# Patient Record
Sex: Male | Born: 1965 | Race: White | Hispanic: No | Marital: Single | Smoking: Never smoker
Health system: Southern US, Community
[De-identification: ages and names within clinical notes are randomized; demographics above are authoritative.]

## PROBLEM LIST (undated history)

## (undated) DIAGNOSIS — E785 Hyperlipidemia, unspecified: Secondary | ICD-10-CM

## (undated) DIAGNOSIS — K219 Gastro-esophageal reflux disease without esophagitis: Secondary | ICD-10-CM

## (undated) DIAGNOSIS — I10 Essential (primary) hypertension: Secondary | ICD-10-CM

## (undated) DIAGNOSIS — M722 Plantar fascial fibromatosis: Secondary | ICD-10-CM

## (undated) DIAGNOSIS — N200 Calculus of kidney: Secondary | ICD-10-CM

## (undated) DIAGNOSIS — E119 Type 2 diabetes mellitus without complications: Secondary | ICD-10-CM

## (undated) HISTORY — DX: Hyperlipidemia, unspecified: E78.5

## (undated) HISTORY — PX: KNEE ARTHROSCOPY: SHX127

## (undated) HISTORY — DX: Calculus of kidney: N20.0

## (undated) HISTORY — DX: Plantar fascial fibromatosis: M72.2

## (undated) HISTORY — PX: KIDNEY STONE SURGERY: SHX686

## (undated) HISTORY — PX: GALLBLADDER SURGERY: SHX652

## (undated) HISTORY — DX: Essential (primary) hypertension: I10

---

## 2004-01-19 HISTORY — PX: CHOLECYSTECTOMY: SHX55

## 2008-03-09 ENCOUNTER — Emergency Department: Payer: Self-pay | Admitting: Emergency Medicine

## 2010-06-10 ENCOUNTER — Ambulatory Visit: Payer: Self-pay | Admitting: Internal Medicine

## 2010-06-18 ENCOUNTER — Ambulatory Visit: Payer: Self-pay | Admitting: Urology

## 2012-12-07 ENCOUNTER — Encounter: Payer: Self-pay | Admitting: Podiatry

## 2013-04-09 ENCOUNTER — Encounter: Payer: Self-pay | Admitting: Podiatry

## 2013-04-09 ENCOUNTER — Ambulatory Visit (INDEPENDENT_AMBULATORY_CARE_PROVIDER_SITE_OTHER): Payer: BC Managed Care – PPO | Admitting: Podiatry

## 2013-04-09 VITALS — BP 115/66 | HR 76 | Resp 18 | Ht 71.0 in | Wt 260.0 lb

## 2013-04-09 DIAGNOSIS — M722 Plantar fascial fibromatosis: Secondary | ICD-10-CM

## 2013-04-09 MED ORDER — METHYLPREDNISOLONE (PAK) 4 MG PO TABS: ORAL_TABLET | ORAL | Status: DC

## 2013-04-09 NOTE — Progress Notes (Signed)
Need an injection in both feet. Both heels it started hurting.  Objective: Vital signs are stable he is alert and oriented x3. He has pain on palpation medial continued tubercles bilateral. Pulses are palpable bilateral.  Assessment: Plantar fasciitis bilateral.  Plan: Injected the bilateral heels today with Kenalog and local anesthetic. I discussed the use of his new orthotics that he has at home that he has not used. Also discussed the use of purchasing new boots for his work. I also wrote a prescription for Medrol Dosepak. He will discontinue his naproxen while taking this. I will followup with him in one month.

## 2014-02-25 ENCOUNTER — Ambulatory Visit (INDEPENDENT_AMBULATORY_CARE_PROVIDER_SITE_OTHER): Payer: BLUE CROSS/BLUE SHIELD | Admitting: Podiatry

## 2014-02-25 ENCOUNTER — Encounter: Payer: Self-pay | Admitting: Podiatry

## 2014-02-25 VITALS — BP 150/96 | HR 68 | Resp 12

## 2014-02-25 DIAGNOSIS — M722 Plantar fascial fibromatosis: Secondary | ICD-10-CM

## 2014-02-25 MED ORDER — METHYLPREDNISOLONE (PAK) 4 MG PO TABS: ORAL_TABLET | ORAL | Status: DC

## 2014-02-25 NOTE — Progress Notes (Signed)
He presents today with a chief complaint of painful heels bilateral. I have reviewed his past medical history medications and allergies he does have a history of plantar fasciitis bilateral. He is requesting pain medication today.  Objective: Vital signs are stable he is alert and oriented 3. He denies any changes in his past medical history medications or allergies. He denies any trauma to the bilateral foot. Pulses are strongly palpable bilateral. He has pain on palpation medial calcaneal tubercles bilateral. He walks with the right foot abducted out.  Assessment: Chronic intractable plantar fasciitis bilateral.  Plan: Medrol Dosepak to be followed by meloxicam. Injected the bilateral heels today. He was also dispensed plantar fascial braces bilateral. We discussed appropriate shoe gear stress changes sizes ice therapy shoe modification and the use of his orthotics which he already has. We also discussed the possible need for surgical intervention he understands this and is amenable to it and we'll look into it in the future he says.

## 2014-05-09 DIAGNOSIS — E669 Obesity, unspecified: Secondary | ICD-10-CM | POA: Insufficient documentation

## 2014-05-09 DIAGNOSIS — T884XXA Failed or difficult intubation, initial encounter: Secondary | ICD-10-CM | POA: Insufficient documentation

## 2014-06-03 ENCOUNTER — Encounter: Payer: BLUE CROSS/BLUE SHIELD | Attending: Family Medicine | Admitting: *Deleted

## 2014-06-03 ENCOUNTER — Encounter: Payer: Self-pay | Admitting: *Deleted

## 2014-06-03 VITALS — BP 112/72 | Ht 69.0 in | Wt 243.3 lb

## 2014-06-03 DIAGNOSIS — E119 Type 2 diabetes mellitus without complications: Secondary | ICD-10-CM

## 2014-06-03 NOTE — Patient Instructions (Signed)
Check blood sugars 1 x day before breakfast or 2 hrs after supper every day  Exercise:  Walk as tolerated  Eat 3 meals day,   2  snacks a day Space meals 4-6 hours apart  Avoid sugar sweetened drinks (soda, tea, juices)  Bring blood sugar records to the next appointment with your doctor

## 2014-06-03 NOTE — Progress Notes (Signed)
Diabetes Self-Management Education  Visit Type:    Appt. Start Time: 10:15 Appt. End Time: 11:45  06/03/2014  [name], identified by name and date of birth, is a 49 y.o. male with a diagnosis of Diabetes: Type 2.  Other people present during visit:  Parent (Mother)   ASSESSMENT  Blood pressure 112/72, height 5\' 9"  (1.753 m), weight 243 lb 4.8 oz (110.36 kg). Body mass index is 35.91 kg/(m^2).  Initial Visit Information:  Are you currently following a meal plan?: No   Are you taking your medications as prescribed?: Yes Are you checking your feet?: No   How often do you need to have someone help you when you read instructions, pamphlets, or other written materials from your doctor or pharmacy?: 1 - Never What is the last grade level you completed in school?: 12 + 1 year collge  Psychosocial:   Patient Belief/Attitude about Diabetes: Denial Self-care barriers: Unsteady gait/risk for falls Self-management support: Family, Doctor's office Other persons present: Parent (Mother) Patient Concerns: Nutrition/Meal planning, Monitoring, Medication, Healthy Lifestyle, Weight Control Special Needs: None Preferred Learning Style: Hands on Learning Readiness: Not Ready  Complications:   Last HgB A1C per patient/outside source: 8.6 mg/dL (7/82/954/28/16) How often do you check your blood sugar?: 1-2 times/day Fasting Blood glucose range (mg/dL): [id number], 657-846180-200, >962>200 Have you had a dilated eye exam in the past 12 months?: Yes Have you had a dental exam in the past 12 months?: Yes  Diet Intake:  Beverage(s): drinks fruit juices and sugar sweetened beverages  He doesn't cook and his mother plans, cooks and shops for his meals.   Exercise:  Exercise: ADL's  Individualized Plan for Diabetes Self-Management Training:   Learning Objective:  Patient will have a greater understanding of diabetes self-management.   Education Topics Reviewed with Patient Today:  Factors that  contribute to the development of diabetes Role of diet in the treatment of diabetes and the relationship between the three main macronutrients and blood glucose level, Reviewed blood glucose goals for pre and post meals and how to evaluate the patients' food intake on their blood glucose level. Role of exercise on diabetes management, blood pressure control and cardiac health.   Purpose and frequency of SMBG., Taught/discussed recording of test results and interpretation of SMBG.   Relationship between chronic complications and blood glucose control    Lifestyle issues that need to be addressed for better diabetes care  PATIENTS GOALS/Plan (Developed by the patient): Improve blood sugars Decrease medications Prevent diabetes complications Lose weight Lead a healthier lifestyle Become more fit   Patient Instructions  Check blood sugars 1 x day before breakfast or 2 hrs after supper every day  Exercise:  Walk as tolerated  Eat 3 meals day,   2  snacks a day Space meals 4-6 hours apart  Avoid sugar sweetened drinks (soda, tea, juices)  Bring blood sugar records to the next appointment with your doctor   Expected Outcomes:  Demonstrated limited interest in learning.  Expect minimal changes.   Education material provided: General Meal Planning Guidelines  If problems or questions, patient to contact team via:  Sharion SettlerSheila Lauralyn Shadowens, RN [phone]  Future DSME appointment:   None During this appointment the patient interrupted the nurse multiple times to ask questions about food. Many questions were often repeated. He told me that I was giving him contradictory information.  Explained that I was instructing per ADA guidelines. The patient pushed back from the table at one point and  reported that he wouldn't be coming back. His mother asked that I continue the instruction on meal planning. They were also argumentative towards each other. He will follow up with his PCP.

## 2014-07-02 ENCOUNTER — Ambulatory Visit (INDEPENDENT_AMBULATORY_CARE_PROVIDER_SITE_OTHER): Payer: BLUE CROSS/BLUE SHIELD | Admitting: Family Medicine

## 2014-07-02 ENCOUNTER — Encounter: Payer: Self-pay | Admitting: Family Medicine

## 2014-07-02 VITALS — BP 127/75 | HR 77 | Temp 97.5°F | Resp 16 | Ht 66.0 in | Wt 237.0 lb

## 2014-07-02 DIAGNOSIS — I10 Essential (primary) hypertension: Secondary | ICD-10-CM | POA: Diagnosis not present

## 2014-07-02 DIAGNOSIS — E1159 Type 2 diabetes mellitus with other circulatory complications: Secondary | ICD-10-CM | POA: Insufficient documentation

## 2014-07-02 DIAGNOSIS — B356 Tinea cruris: Secondary | ICD-10-CM

## 2014-07-02 DIAGNOSIS — E669 Obesity, unspecified: Secondary | ICD-10-CM | POA: Insufficient documentation

## 2014-07-02 DIAGNOSIS — E785 Hyperlipidemia, unspecified: Secondary | ICD-10-CM | POA: Diagnosis not present

## 2014-07-02 DIAGNOSIS — E119 Type 2 diabetes mellitus without complications: Secondary | ICD-10-CM | POA: Diagnosis not present

## 2014-07-02 DIAGNOSIS — E1169 Type 2 diabetes mellitus with other specified complication: Secondary | ICD-10-CM

## 2014-07-02 MED ORDER — TRAMADOL HCL 50 MG PO TABS: 50.0000 mg | ORAL_TABLET | Freq: Three times a day (TID) | ORAL | Status: DC | PRN

## 2014-07-02 NOTE — Assessment & Plan Note (Signed)
The current medical regimen is effective;  continue present plan and medications.  

## 2014-07-02 NOTE — Assessment & Plan Note (Signed)
The current medical regimen is effective;  continue present plan and medications. Cont diet exercise and wt loss

## 2014-07-02 NOTE — Progress Notes (Signed)
   BP 127/75 mmHg  Pulse 77  Temp(Src) 97.5 F (36.4 C) (Oral)  Resp 16  Ht 5\' 6"  (1.676 m)  Wt 237 lb (107.502 kg)  BMI 38.27 kg/m2  SpO2 99%   Subjective:    Patient ID: Joshua Lee, male    DOB: 1965/08/01, 49 y.o.   MRN: [id number]  HPI: Joshua Lee is a 49 y.o. male  Chief Complaint  Patient presents with  . Follow-up    Glucose  . Medication Refill    Tramadol  . Hip Pain    Right onset 1-2 years, gotten worse  pt doing well with DM and new meds  Glu monitoring review is showing glu down to 130s Some jock itch problems with heat Has lost 10lbs Hip pain takes tramadol for and feet stable  Relevant past medical, surgical, family and social history reviewed and updated as indicated. Interim medical history since our last visit reviewed. Allergies and medications reviewed and updated.  Review of Systems  Constitutional: Negative.   Respiratory: Negative.   Cardiovascular: Negative.     Per HPI unless specifically indicated above     Objective:    BP 127/75 mmHg  Pulse 77  Temp(Src) 97.5 F (36.4 C) (Oral)  Resp 16  Ht 5\' 6"  (1.676 m)  Wt 237 lb (107.502 kg)  BMI 38.27 kg/m2  SpO2 99%  Wt Readings from Last 3 Encounters:  07/02/14 237 lb (107.502 kg)  05/21/14 247 lb (112.038 kg)  06/03/14 243 lb 4.8 oz (110.36 kg)    Physical Exam  Constitutional: He is oriented to person, place, and time. He appears well-developed and well-nourished. No distress.  HENT:  Head: Normocephalic and atraumatic.  Right Ear: Hearing normal.  Left Ear: Hearing normal.  Nose: Nose normal.  Eyes: Conjunctivae and lids are normal. Right eye exhibits no discharge. Left eye exhibits no discharge. No scleral icterus.  Cardiovascular: Normal rate, regular rhythm and normal heart sounds.   Pulmonary/Chest: Effort normal and breath sounds normal. No respiratory distress.  Musculoskeletal: Normal range of motion.  Neurological: He is alert and oriented to person, place,  and time.  Skin: Skin is intact. No rash noted.  Psychiatric: He has a normal mood and affect. His speech is normal and behavior is normal. Judgment and thought content normal. Cognition and memory are normal.    No results found for this or any previous visit.    Assessment & Plan:   Problem List Items Addressed This Visit      Cardiovascular and Mediastinum   Hypertension - Primary    The current medical regimen is effective;  continue present plan and medications.        Endocrine   Diabetes mellitus without complication    The current medical regimen is effective;  continue present plan and medications. Cont diet exercise and wt loss      Relevant Orders   Hemoglobin A1c     Other   Hyperlipidemia    The current medical regimen is effective;  continue present plan and medications.        Other Visit Diagnoses    Jock itch        discuss care and use of OTC meds        Follow up plan: Return in about 2 months (around 09/01/2014) for DM check.

## 2014-07-14 ENCOUNTER — Other Ambulatory Visit: Payer: Self-pay | Admitting: Family Medicine

## 2014-08-09 ENCOUNTER — Other Ambulatory Visit: Payer: Self-pay | Admitting: Family Medicine

## 2014-08-16 ENCOUNTER — Other Ambulatory Visit: Payer: Self-pay | Admitting: Family Medicine

## 2014-09-02 ENCOUNTER — Ambulatory Visit (INDEPENDENT_AMBULATORY_CARE_PROVIDER_SITE_OTHER): Payer: BLUE CROSS/BLUE SHIELD | Admitting: Family Medicine

## 2014-09-02 ENCOUNTER — Encounter: Payer: Self-pay | Admitting: Family Medicine

## 2014-09-02 VITALS — BP 110/64 | HR 86 | Temp 98.1°F | Ht 68.5 in | Wt 241.0 lb

## 2014-09-02 DIAGNOSIS — I10 Essential (primary) hypertension: Secondary | ICD-10-CM | POA: Diagnosis not present

## 2014-09-02 DIAGNOSIS — E785 Hyperlipidemia, unspecified: Secondary | ICD-10-CM

## 2014-09-02 DIAGNOSIS — E119 Type 2 diabetes mellitus without complications: Secondary | ICD-10-CM

## 2014-09-02 MED ORDER — TRAMADOL HCL 50 MG PO TABS: 50.0000 mg | ORAL_TABLET | Freq: Every day | ORAL | Status: DC | PRN

## 2014-09-02 MED ORDER — METFORMIN HCL 500 MG PO TABS: 500.0000 mg | ORAL_TABLET | Freq: Two times a day (BID) | ORAL | Status: DC

## 2014-09-02 NOTE — Progress Notes (Signed)
   BP 110/64 mmHg  Pulse 86  Temp(Src) 98.1 F (36.7 C)  Ht 5' 8.5" (1.74 m)  Wt 241 lb (109.317 kg)  BMI 36.11 kg/m2  SpO2 96%   Subjective:    Patient ID: Joshua Lee, male    DOB: 08-24-1965, 49 y.o.   MRN: [id number]  HPI: Joshua Lee is a 49 y.o. male  Chief Complaint  Patient presents with  . Diabetes  . Ear Pain    left   Patient follow-up new diagnosis diabetes doing well with metformin no low blood sugar spells home glucose monitoring showing good blood sugars in the low 100s. No problems with medication Blood pressure doing well on medications Cholesterol doing well on medications takes faithfully with no side effects Relevant past medical, surgical, family and social history reviewed and updated as indicated. Interim medical history since our last visit reviewed. Allergies and medications reviewed and updated. Patient takes tramadol one a day during work days which is about 6 days a week for foot pain and has to stand all day long on 10 hour days and foot and knees hurt. Tramadol allows him to continue to work. Review of Systems  Constitutional: Negative.   HENT: Positive for ear pain.   Respiratory: Negative.   Cardiovascular: Negative.     Per HPI unless specifically indicated above     Objective:    BP 110/64 mmHg  Pulse 86  Temp(Src) 98.1 F (36.7 C)  Ht 5' 8.5" (1.74 m)  Wt 241 lb (109.317 kg)  BMI 36.11 kg/m2  SpO2 96%  Wt Readings from Last 3 Encounters:  09/02/14 241 lb (109.317 kg)  07/02/14 237 lb (107.502 kg)  05/21/14 247 lb (112.038 kg)    Physical Exam  Constitutional: He is oriented to person, place, and time. He appears well-developed and well-nourished. No distress.  HENT:  Head: Normocephalic and atraumatic.  Right Ear: Hearing normal.  Left Ear: Hearing normal.  Nose: Nose normal.  Eyes: Conjunctivae and lids are normal. Right eye exhibits no discharge. Left eye exhibits no discharge. No scleral icterus.   Pulmonary/Chest: Effort normal. No respiratory distress.  Musculoskeletal: Normal range of motion.  Neurological: He is alert and oriented to person, place, and time.  Skin: Skin is intact. No rash noted.  Psychiatric: He has a normal mood and affect. His speech is normal and behavior is normal. Judgment and thought content normal. Cognition and memory are normal.    No results found for this or any previous visit.    Assessment & Plan:   Problem List Items Addressed This Visit      Cardiovascular and Mediastinum   Hypertension - Primary    The current medical regimen is effective;  continue present plan and medications.         Endocrine   Diabetes mellitus without complication    Discuss doing very well with diabetes A1c down to 5.8 decrease metformin to 500 twice a day      Relevant Medications   metFORMIN (GLUCOPHAGE) 500 MG tablet     Other   Hyperlipidemia    The current medical regimen is effective;  continue present plan and medications.           Follow up plan: Return in about 3 months (around 12/03/2014) for Physical Exam.

## 2014-09-02 NOTE — Assessment & Plan Note (Signed)
Discuss doing very well with diabetes A1c down to 5.8 decrease metformin to 500 twice a day

## 2014-09-02 NOTE — Assessment & Plan Note (Signed)
The current medical regimen is effective;  continue present plan and medications.  

## 2014-09-03 ENCOUNTER — Other Ambulatory Visit: Payer: Self-pay | Admitting: Family Medicine

## 2014-09-03 LAB — BAYER DCA HB A1C WAIVED: HB A1C: 5.8 % (ref <7.0)

## 2014-10-31 ENCOUNTER — Other Ambulatory Visit: Payer: Self-pay | Admitting: Family Medicine

## 2014-11-04 ENCOUNTER — Other Ambulatory Visit: Payer: Self-pay

## 2014-11-04 MED ORDER — TRAMADOL HCL 50 MG PO TABS: 50.0000 mg | ORAL_TABLET | Freq: Every day | ORAL | Status: DC | PRN

## 2014-11-04 NOTE — Telephone Encounter (Signed)
Call pharm rx is for 1 a day PRN not TID

## 2014-11-04 NOTE — Telephone Encounter (Signed)
PATIENT: Joshua Lee DOB: August 19, 1965 PHARMACY: [facility]  LAST VISIT: 09/02/2014  Patient requests tramadol 50 mg tab once a day.   I contacted pharmacy because the fax that was received was barely visible. Pharmacist said that he had a prescription for tramadol 50 mg for 3 times a day. The prescription sent is for once a day. I do not see in his chart where he has another prescription for 3 times daily.

## 2014-12-02 ENCOUNTER — Ambulatory Visit (INDEPENDENT_AMBULATORY_CARE_PROVIDER_SITE_OTHER): Payer: BLUE CROSS/BLUE SHIELD | Admitting: Family Medicine

## 2014-12-02 ENCOUNTER — Encounter: Payer: Self-pay | Admitting: Family Medicine

## 2014-12-02 VITALS — BP 120/73 | HR 73 | Temp 98.5°F | Ht 67.2 in | Wt 254.0 lb

## 2014-12-02 DIAGNOSIS — E119 Type 2 diabetes mellitus without complications: Secondary | ICD-10-CM | POA: Diagnosis not present

## 2014-12-02 DIAGNOSIS — Z0000 Encounter for general adult medical examination without abnormal findings: Secondary | ICD-10-CM

## 2014-12-02 DIAGNOSIS — E785 Hyperlipidemia, unspecified: Secondary | ICD-10-CM

## 2014-12-02 DIAGNOSIS — I10 Essential (primary) hypertension: Secondary | ICD-10-CM

## 2014-12-02 DIAGNOSIS — N200 Calculus of kidney: Secondary | ICD-10-CM | POA: Diagnosis not present

## 2014-12-02 LAB — URINALYSIS, ROUTINE W REFLEX MICROSCOPIC
BILIRUBIN UA: NEGATIVE
GLUCOSE, UA: NEGATIVE
Leukocytes, UA: NEGATIVE
NITRITE UA: NEGATIVE
PH UA: 6 (ref 5.0–7.5)
Protein, UA: NEGATIVE
Specific Gravity, UA: 1.02 (ref 1.005–1.030)
UUROB: 0.2 mg/dL (ref 0.2–1.0)

## 2014-12-02 LAB — MICROALBUMIN, URINE WAIVED
CREATININE, URINE WAIVED: 100 mg/dL (ref 10–300)
MICROALB, UR WAIVED: 30 mg/L — AB (ref 0–19)
Microalb/Creat Ratio: <30 mg/g (ref <30)

## 2014-12-02 LAB — MICROSCOPIC EXAMINATION: WBC, UA: NONE SEEN /hpf (ref 0–?)

## 2014-12-02 LAB — BAYER DCA HB A1C WAIVED: HB A1C: 6.4 % (ref <7.0)

## 2014-12-02 MED ORDER — TAMSULOSIN HCL 0.4 MG PO CAPS: 0.4000 mg | ORAL_CAPSULE | Freq: Every day | ORAL | Status: DC

## 2014-12-02 MED ORDER — BENAZEPRIL HCL 40 MG PO TABS: 40.0000 mg | ORAL_TABLET | Freq: Every day | ORAL | Status: DC

## 2014-12-02 MED ORDER — METFORMIN HCL 500 MG PO TABS: 500.0000 mg | ORAL_TABLET | Freq: Two times a day (BID) | ORAL | Status: DC

## 2014-12-02 MED ORDER — METOPROLOL TARTRATE 100 MG PO TABS: 100.0000 mg | ORAL_TABLET | Freq: Two times a day (BID) | ORAL | Status: DC

## 2014-12-02 MED ORDER — HYDROCHLOROTHIAZIDE 12.5 MG PO CAPS: 12.5000 mg | ORAL_CAPSULE | Freq: Every day | ORAL | Status: DC

## 2014-12-02 MED ORDER — LOVASTATIN 20 MG PO TABS: 40.0000 mg | ORAL_TABLET | Freq: Every day | ORAL | Status: DC

## 2014-12-02 MED ORDER — NAPROXEN 500 MG PO TABS: 500.0000 mg | ORAL_TABLET | Freq: Two times a day (BID) | ORAL | Status: DC

## 2014-12-02 NOTE — Assessment & Plan Note (Signed)
The current medical regimen is effective;  continue present plan and medications.  

## 2014-12-02 NOTE — Progress Notes (Signed)
BP 120/73 mmHg  Pulse 73  Temp(Src) 98.5 F (36.9 C)  Ht 5' 7.2" (1.707 m)  Wt 254 lb (115.214 kg)  BMI 39.54 kg/m2  SpO2 97%   Subjective:    Patient ID: Joshua Lee, male    DOB: 02/20/1965, 49 y.o.   MRN: [id number]  HPI: Joshua Lee is a 49 y.o. male  Chief Complaint  Patient presents with  . Annual Exam  . URI    X 2 days   sore throat and ears stopped up some congestion and drainage for just 2 days but getting worse just feels stuffy  Patient doing well with diabetes noted low blood sugar spells No problems with medications for blood pressure cholesterol taking medications faithfully  Relevant past medical, surgical, family and social history reviewed and updated as indicated. Interim medical history since our last visit reviewed. Allergies and medications reviewed and updated.  Review of Systems  Constitutional: Negative.   HENT: Negative.   Eyes: Negative.   Respiratory: Negative.   Cardiovascular: Negative.   Gastrointestinal: Negative.   Endocrine: Negative.   Genitourinary: Negative.   Musculoskeletal: Negative.   Skin: Negative.   Allergic/Immunologic: Negative.   Neurological: Negative.   Hematological: Negative.   Psychiatric/Behavioral: Negative.     Per HPI unless specifically indicated above     Objective:    BP 120/73 mmHg  Pulse 73  Temp(Src) 98.5 F (36.9 C)  Ht 5' 7.2" (1.707 m)  Wt 254 lb (115.214 kg)  BMI 39.54 kg/m2  SpO2 97%  Wt Readings from Last 3 Encounters:  12/02/14 254 lb (115.214 kg)  09/02/14 241 lb (109.317 kg)  07/02/14 237 lb (107.502 kg)    Physical Exam  Constitutional: He is oriented to person, place, and time. He appears well-developed and well-nourished. No distress.  HENT:  Head: Normocephalic and atraumatic.  Right Ear: Hearing and external ear normal.  Left Ear: Hearing and external ear normal.  Nose: Nose normal.  Eyes: Conjunctivae, EOM and lids are normal. Pupils are equal, round, and  reactive to light. Right eye exhibits no discharge. Left eye exhibits no discharge. No scleral icterus.  Neck: Normal range of motion. Neck supple.  Cardiovascular: Normal rate, regular rhythm, normal heart sounds and intact distal pulses.   Pulmonary/Chest: Effort normal and breath sounds normal. No respiratory distress.  Abdominal: Soft. Bowel sounds are normal. There is no splenomegaly or hepatomegaly.  Genitourinary: Rectum normal, prostate normal and penis normal.  Musculoskeletal: Normal range of motion.  Neurological: He is alert and oriented to person, place, and time. He has normal reflexes.  Skin: Skin is intact. No rash noted. No erythema.  Psychiatric: He has a normal mood and affect. His speech is normal and behavior is normal. Judgment and thought content normal. Cognition and memory are normal.    Results for orders placed or performed in visit on 09/02/14  Bayer DCA Hb A1c Waived  Result Value Ref Range   Bayer DCA Hb A1c Waived 5.8 <7.0 %      Assessment & Plan:   Problem List Items Addressed This Visit      Cardiovascular and Mediastinum   Hypertension    The current medical regimen is effective;  continue present plan and medications.       Relevant Medications   benazepril (LOTENSIN) 40 MG tablet   lovastatin (MEVACOR) 20 MG tablet   metoprolol (LOPRESSOR) 100 MG tablet   hydrochlorothiazide (MICROZIDE) 12.5 MG capsule     Endocrine  Diabetes mellitus without complication (HCC)    The current medical regimen is effective;  continue present plan and medications.       Relevant Medications   benazepril (LOTENSIN) 40 MG tablet   lovastatin (MEVACOR) 20 MG tablet   metFORMIN (GLUCOPHAGE) 500 MG tablet   Other Relevant Orders   Bayer DCA Hb A1c Waived     Genitourinary   Kidney stones    [facility] and [facility] stable      Relevant Medications   hydrochlorothiazide (MICROZIDE) 12.5 MG capsule     Other   Hyperlipidemia    The  current medical regimen is effective;  continue present plan and medications.       Relevant Medications   benazepril (LOTENSIN) 40 MG tablet   lovastatin (MEVACOR) 20 MG tablet   metoprolol (LOPRESSOR) 100 MG tablet   hydrochlorothiazide (MICROZIDE) 12.5 MG capsule    Other Visit Diagnoses    Annual physical exam    -  Primary    Relevant Orders    CBC with Differential/Platelet    TSH    Urinalysis, Routine w reflex microscopic (not at [facility])    Microalbumin, Urine Waived    PSA    Comprehensive metabolic panel    Lipid Panel w/o Chol/HDL Ratio        Follow up plan: Return in about 3 months (around 03/04/2015) for a1c.

## 2014-12-02 NOTE — Assessment & Plan Note (Signed)
[facility] and [facility]uria stable

## 2014-12-03 ENCOUNTER — Telehealth: Payer: Self-pay | Admitting: Family Medicine

## 2014-12-03 LAB — COMPREHENSIVE METABOLIC PANEL
A/G RATIO: 1.7 (ref 1.1–2.5)
ALBUMIN: 4.3 g/dL (ref 3.5–5.5)
ALT: 42 IU/L (ref 0–44)
AST: 25 IU/L (ref 0–40)
Alkaline Phosphatase: 53 IU/L (ref 39–117)
BILIRUBIN TOTAL: 0.3 mg/dL (ref 0.0–1.2)
BUN / CREAT RATIO: 22 — AB (ref 9–20)
BUN: 22 mg/dL (ref 6–24)
CALCIUM: 9.1 mg/dL (ref 8.7–10.2)
CO2: 25 mmol/L (ref 18–29)
Chloride: 100 mmol/L (ref 97–106)
Creatinine, Ser: 1 mg/dL (ref 0.76–1.27)
GFR, EST AFRICAN AMERICAN: 102 mL/min/{1.73_m2} (ref >59)
GFR, EST NON AFRICAN AMERICAN: 88 mL/min/{1.73_m2} (ref >59)
GLOBULIN, TOTAL: 2.6 g/dL (ref 1.5–4.5)
Glucose: 85 mg/dL (ref 65–99)
POTASSIUM: 4.2 mmol/L (ref 3.5–5.2)
SODIUM: 142 mmol/L (ref 136–144)
TOTAL PROTEIN: 6.9 g/dL (ref 6.0–8.5)

## 2014-12-03 LAB — TSH: TSH: 3.21 u[IU]/mL (ref 0.450–4.500)

## 2014-12-03 LAB — LIPID PANEL W/O CHOL/HDL RATIO
Cholesterol, Total: 257 mg/dL — ABNORMAL HIGH (ref 100–199)
HDL: 30 mg/dL — AB (ref >39)
Triglycerides: 693 mg/dL (ref 0–149)

## 2014-12-03 LAB — CBC WITH DIFFERENTIAL/PLATELET

## 2014-12-03 LAB — PSA: Prostate Specific Ag, Serum: 0.7 ng/mL (ref 0.0–4.0)

## 2014-12-03 NOTE — Telephone Encounter (Signed)
Phone call Discussed with patient elevated triglycerides as expected patient stopped his medicine will be getting back on medicine next week and will do better with diet exercise weight loss recheck lipid panel next office visit

## 2014-12-03 NOTE — Telephone Encounter (Signed)
-----   Message from Lurlean HornsNancy H Wilson, CMA sent at 12/03/2014  4:55 PM EST ----- labs

## 2015-01-03 ENCOUNTER — Other Ambulatory Visit: Payer: Self-pay | Admitting: Family Medicine

## 2015-01-06 ENCOUNTER — Other Ambulatory Visit: Payer: Self-pay | Admitting: Family Medicine

## 2015-01-08 ENCOUNTER — Encounter: Payer: Self-pay | Admitting: Podiatry

## 2015-01-08 ENCOUNTER — Ambulatory Visit (INDEPENDENT_AMBULATORY_CARE_PROVIDER_SITE_OTHER): Payer: BLUE CROSS/BLUE SHIELD | Admitting: Podiatry

## 2015-01-08 DIAGNOSIS — M79674 Pain in right toe(s): Secondary | ICD-10-CM

## 2015-01-08 DIAGNOSIS — M722 Plantar fascial fibromatosis: Secondary | ICD-10-CM | POA: Diagnosis not present

## 2015-01-08 DIAGNOSIS — M79675 Pain in left toe(s): Secondary | ICD-10-CM | POA: Diagnosis not present

## 2015-01-08 DIAGNOSIS — B351 Tinea unguium: Secondary | ICD-10-CM

## 2015-01-08 NOTE — Progress Notes (Signed)
He presents today stating that he is now a diabetic and like to consider injections or his bilateral heel pain. He states that there was a long time that he felt much better but as of late he started to redevelop his heel pain. He requests a Medrol Dosepak as I explained to him this would increase his blood sugar concentration. He is also requesting narcotics which will be denied.  Objective: Pulses are strongly palpable bilateral neurologic sensorium is intact. He has severe pain on palpation medial calcaneal tubercles. No open lesions bilateral. Skin is soft and supple. His toenails are thick yellow dystrophic mycotic and painful palpation as well as debridement.  Assessment: Pain in limb secondary to onychomycosis 1 through 5 bilateral. Plantar fasciitis bilateral.  Plan: Injected the bilateral heels today with Kenalog and local anesthetic. Debrided nails 1 through 5 bilateral. Follow up with us as needed.

## 2015-02-27 ENCOUNTER — Other Ambulatory Visit: Payer: Self-pay | Admitting: Family Medicine

## 2015-03-05 ENCOUNTER — Ambulatory Visit (INDEPENDENT_AMBULATORY_CARE_PROVIDER_SITE_OTHER): Payer: BLUE CROSS/BLUE SHIELD | Admitting: Family Medicine

## 2015-03-05 ENCOUNTER — Encounter: Payer: Self-pay | Admitting: Family Medicine

## 2015-03-05 VITALS — BP 131/72 | HR 83 | Temp 97.3°F | Ht 67.5 in | Wt 248.0 lb

## 2015-03-05 DIAGNOSIS — E119 Type 2 diabetes mellitus without complications: Secondary | ICD-10-CM

## 2015-03-05 DIAGNOSIS — M19071 Primary osteoarthritis, right ankle and foot: Secondary | ICD-10-CM

## 2015-03-05 DIAGNOSIS — M19072 Primary osteoarthritis, left ankle and foot: Secondary | ICD-10-CM | POA: Diagnosis not present

## 2015-03-05 DIAGNOSIS — I10 Essential (primary) hypertension: Secondary | ICD-10-CM | POA: Diagnosis not present

## 2015-03-05 MED ORDER — NAPROXEN 500 MG PO TABS: 500.0000 mg | ORAL_TABLET | Freq: Two times a day (BID) | ORAL | Status: DC

## 2015-03-05 NOTE — Assessment & Plan Note (Signed)
The current medical regimen is effective;  continue present plan and medications.  

## 2015-03-05 NOTE — Progress Notes (Signed)
BP 131/72 mmHg  Pulse 83  Temp(Src) 97.3 F (36.3 C)  Ht 5' 7.5" (1.715 m)  Wt 248 lb (112.492 kg)  BMI 38.25 kg/m2  SpO2 99%   Subjective:    Patient ID: Joshua Lee, male    DOB: 1965/05/24, 50 y.o.   MRN: [id number]  HPI: Joshua Lee is a 50 y.o. male  Chief Complaint  Patient presents with  . Diabetes   She doing well noted low blood sugar spells has lost some weight taking medications without problems and faithfully no side effects Blood pressure doing well cholesterol doing well No issues with any medications Relevant past medical, surgical, family and social history reviewed and updated as indicated. Interim medical history since our last visit reviewed. Allergies and medications reviewed and updated.  Review of Systems  Constitutional: Negative.   Respiratory: Negative.   Cardiovascular: Negative.     Per HPI unless specifically indicated above     Objective:    BP 131/72 mmHg  Pulse 83  Temp(Src) 97.3 F (36.3 C)  Ht 5' 7.5" (1.715 m)  Wt 248 lb (112.492 kg)  BMI 38.25 kg/m2  SpO2 99%  Wt Readings from Last 3 Encounters:  03/05/15 248 lb (112.492 kg)  12/02/14 254 lb (115.214 kg)  09/02/14 241 lb (109.317 kg)    Physical Exam  Constitutional: He is oriented to person, place, and time. He appears well-developed and well-nourished. No distress.  HENT:  Head: Normocephalic and atraumatic.  Right Ear: Hearing normal.  Left Ear: Hearing normal.  Nose: Nose normal.  Eyes: Conjunctivae and lids are normal. Right eye exhibits no discharge. Left eye exhibits no discharge. No scleral icterus.  Cardiovascular: Normal rate, regular rhythm and normal heart sounds.   Pulmonary/Chest: Effort normal and breath sounds normal. No respiratory distress.  Musculoskeletal: Normal range of motion.  Neurological: He is alert and oriented to person, place, and time.  Skin: Skin is intact. No rash noted.  Psychiatric: He has a normal mood and affect. His  speech is normal and behavior is normal. Judgment and thought content normal. Cognition and memory are normal.    Results for orders placed or performed in visit on 12/02/14  Microscopic Examination  Result Value Ref Range   WBC, UA None seen 0 -  5 /hpf   RBC, UA 3-10 (A) 0 -  2 /hpf   Epithelial Cells (non renal) 0-10 0 - 10 /hpf  CBC with Differential/Platelet  Result Value Ref Range   WBC CANCELED x10E3/uL   RBC CANCELED    Hemoglobin CANCELED    Hematocrit CANCELED    Platelets CANCELED    Neutrophils CANCELED    Lymphs CANCELED    Monocytes CANCELED    Eos CANCELED    Lymphocytes Absolute CANCELED    EOS (ABSOLUTE) CANCELED    Basophils Absolute CANCELED   TSH  Result Value Ref Range   TSH 3.210 0.450 - 4.500 uIU/mL  Urinalysis, Routine w reflex microscopic (not at [facility])  Result Value Ref Range   Specific Gravity, UA 1.020 1.005 - 1.030   pH, UA 6.0 5.0 - 7.5   Color, UA Yellow Yellow   Appearance Ur Clear Clear   Leukocytes, UA Negative Negative   Protein, UA Negative Negative/Trace   Glucose, UA Negative Negative   Ketones, UA Trace (A) Negative   RBC, UA 1+ (A) Negative   Bilirubin, UA Negative Negative   Urobilinogen, Ur 0.2 0.2 - 1.0 mg/dL   Nitrite, UA  Negative Negative   Microscopic Examination See below:   Microalbumin, Urine Waived  Result Value Ref Range   Microalb, Ur Waived 30 (H) 0 - 19 mg/L   Creatinine, Urine Waived 100 10 - 300 mg/dL   Microalb/Creat Ratio <30 <30 mg/g  PSA  Result Value Ref Range   Prostate Specific Ag, Serum 0.7 0.0 - 4.0 ng/mL  Comprehensive metabolic panel  Result Value Ref Range   Glucose 85 65 - 99 mg/dL   BUN 22 6 - 24 mg/dL   Creatinine, Ser 1.61 0.76 - 1.27 mg/dL   GFR calc non Af Amer 88 >59 mL/min/1.73   GFR calc Af Amer 102 >59 mL/min/1.73   BUN/Creatinine Ratio 22 (H) 9 - 20   Sodium 142 136 - 144 mmol/L   Potassium 4.2 3.5 - 5.2 mmol/L   Chloride 100 97 - 106 mmol/L   CO2 25 18 - 29 mmol/L   Calcium 9.1  8.7 - 10.2 mg/dL   Total Protein 6.9 6.0 - 8.5 g/dL   Albumin 4.3 3.5 - 5.5 g/dL   Globulin, Total 2.6 1.5 - 4.5 g/dL   Albumin/Globulin Ratio 1.7 1.1 - 2.5   Bilirubin Total 0.3 0.0 - 1.2 mg/dL   Alkaline Phosphatase 53 39 - 117 IU/L   AST 25 0 - 40 IU/L   ALT 42 0 - 44 IU/L  Lipid Panel w/o Chol/HDL Ratio  Result Value Ref Range   Cholesterol, Total 257 (H) 100 - 199 mg/dL   Triglycerides 096 (HH) 0 - 149 mg/dL   HDL 30 (L) >04 mg/dL   VLDL Cholesterol Cal Comment 5 - 40 mg/dL   LDL Calculated Comment 0 - 99 mg/dL  Bayer DCA Hb V4U Waived  Result Value Ref Range   Bayer DCA Hb A1c Waived 6.4 <7.0 %      Assessment & Plan:   Problem List Items Addressed This Visit      Cardiovascular and Mediastinum   Hypertension    The current medical regimen is effective;  continue present plan and medications.         Endocrine   Diabetes mellitus without complication (HCC) - Primary    The current medical regimen is effective;  continue present plan and medications.       Relevant Orders   Bayer DCA Hb A1c Waived     Musculoskeletal and Integument   Osteoarthritis of both feet    Patient with a lot of arthritis in hips and knees back feet and ankles Naprosyn helps takes occasional tramadol is able to work full-time Talked about orthopedic referral patient not ready yet is going to chiropractor now.      Relevant Medications   naproxen (NAPROSYN) 500 MG tablet       Follow up plan: Return in about 3 months (around 06/02/2015) for med check with BMP, lipids, ALT, AST, hemoglobin A1c.

## 2015-03-05 NOTE — Assessment & Plan Note (Signed)
Patient with a lot of arthritis in hips and knees back feet and ankles Naprosyn helps takes occasional tramadol is able to work full-time Talked about orthopedic referral patient not ready yet is going to chiropractor now.

## 2015-03-06 LAB — BAYER DCA HB A1C WAIVED: HB A1C (BAYER DCA - WAIVED): 6.1 % (ref <7.0)

## 2015-04-12 ENCOUNTER — Encounter: Payer: Self-pay | Admitting: Emergency Medicine

## 2015-04-12 ENCOUNTER — Emergency Department: Payer: BLUE CROSS/BLUE SHIELD

## 2015-04-12 ENCOUNTER — Emergency Department
Admission: EM | Admit: 2015-04-12 | Discharge: 2015-04-12 | Disposition: A | Discharge status: Home / Self Care | Payer: BLUE CROSS/BLUE SHIELD | Attending: Emergency Medicine | Admitting: Emergency Medicine

## 2015-04-12 DIAGNOSIS — E119 Type 2 diabetes mellitus without complications: Secondary | ICD-10-CM | POA: Diagnosis not present

## 2015-04-12 DIAGNOSIS — Z880 Allergy status to penicillin: Secondary | ICD-10-CM | POA: Diagnosis not present

## 2015-04-12 DIAGNOSIS — Z79899 Other long term (current) drug therapy: Secondary | ICD-10-CM | POA: Diagnosis not present

## 2015-04-12 DIAGNOSIS — R079 Chest pain, unspecified: Secondary | ICD-10-CM | POA: Diagnosis present

## 2015-04-12 DIAGNOSIS — Z7982 Long term (current) use of aspirin: Secondary | ICD-10-CM | POA: Diagnosis not present

## 2015-04-12 DIAGNOSIS — Z7984 Long term (current) use of oral hypoglycemic drugs: Secondary | ICD-10-CM | POA: Insufficient documentation

## 2015-04-12 DIAGNOSIS — I10 Essential (primary) hypertension: Secondary | ICD-10-CM | POA: Diagnosis not present

## 2015-04-12 DIAGNOSIS — Z791 Long term (current) use of non-steroidal anti-inflammatories (NSAID): Secondary | ICD-10-CM | POA: Insufficient documentation

## 2015-04-12 LAB — CBC
HEMATOCRIT: 34.1 % — AB (ref 40.0–52.0)
HEMOGLOBIN: 11.7 g/dL — AB (ref 13.0–18.0)
MCH: 28.5 pg (ref 26.0–34.0)
MCHC: 34.3 g/dL (ref 32.0–36.0)
MCV: 83.1 fL (ref 80.0–100.0)
Platelets: 181 10*3/uL (ref 150–440)
RBC: 4.1 MIL/uL — AB (ref 4.40–5.90)
RDW: 15.1 % — ABNORMAL HIGH (ref 11.5–14.5)
WBC: 8.4 10*3/uL (ref 3.8–10.6)

## 2015-04-12 LAB — TROPONIN I

## 2015-04-12 LAB — BASIC METABOLIC PANEL
ANION GAP: 8 (ref 5–15)
BUN: 26 mg/dL — ABNORMAL HIGH (ref 6–20)
CHLORIDE: 101 mmol/L (ref 101–111)
CO2: 24 mmol/L (ref 22–32)
Calcium: 8.6 mg/dL — ABNORMAL LOW (ref 8.9–10.3)
Creatinine, Ser: 1.06 mg/dL (ref 0.61–1.24)
GFR calc non Af Amer: >60 mL/min (ref >60)
Glucose, Bld: 104 mg/dL — ABNORMAL HIGH (ref 65–99)
POTASSIUM: 4 mmol/L (ref 3.5–5.1)
Sodium: 133 mmol/L — ABNORMAL LOW (ref 135–145)

## 2015-04-12 MED ORDER — SODIUM CHLORIDE 0.9 % IV BOLUS (SEPSIS)
1000.0000 mL | Freq: Once | INTRAVENOUS | Status: AC
  Administered 2015-04-12: 1000 mL via INTRAVENOUS

## 2015-04-12 MED ORDER — ASPIRIN EC 81 MG PO TBEC
162.0000 mg | DELAYED_RELEASE_TABLET | Freq: Once | ORAL | Status: AC
  Administered 2015-04-12: 162 mg via ORAL
  Filled 2015-04-12: qty 2

## 2015-04-12 MED ORDER — ASPIRIN EC 325 MG PO TBEC: 325.0000 mg | DELAYED_RELEASE_TABLET | Freq: Once | ORAL | Status: DC

## 2015-04-12 NOTE — ED Notes (Addendum)
Patient states that this morning he was at work he started having left sided chest pain, patient denies shortness of breath, dizziness, sweating, N/V. Patient denies radiation of the pain to arm or neck.

## 2015-04-12 NOTE — ED Notes (Signed)
Left sided chest pain/heaviness that began this am while at work, pt appears in no distress at this time.

## 2015-04-12 NOTE — Discharge Instructions (Signed)
Please return to the emergency department if you develop severe pain, shortness of breath, lightheadedness or fainting, fever, or any other symptoms concerning to you. °

## 2015-04-12 NOTE — ED Notes (Signed)
Report given to Philis KendallShannin H, RN

## 2015-04-12 NOTE — ED Notes (Signed)
MD at bedside. 

## 2015-04-12 NOTE — ED Notes (Signed)
Patient states that he took 2 81 mg Aspirin this morning when he had the chest pain

## 2015-04-12 NOTE — ED Provider Notes (Signed)
[facility] Emergency Department Provider Note  ____________________________________________  Time seen: Approximately 9:33 AM  I have reviewed the triage vital signs and the nursing notes.   HISTORY  Chief Complaint Chest Pain    HPI Joshua Lee is a 50 y.o. male with a history of HTN, HL, DM presenting with chest pain. Patient states that he was exerting himself at work when he developed a "sharp pressure" in the left side of the chest that was associated with "heavy breathing.". No radiation, palpitations, diaphoresis, nausea or vomiting. No lightheadedness or syncope. The episode lasted for 1-2 minutes and resolve spontaneously. Since then he has had several more episodes of the same duration and character. The patient is a vague historian and states that he has had intermittent chest pain for an unknown period of time at an unknown frequency that he describes as "light."  He has not had any risk stratification studies including stress test or cardiac catheterizations in the past.  FH: Family history of early heart disease. No family history of clots.  SH: No tobacco, cocaine use. Positive daily EtOH use.   Past Medical History  Diagnosis Date  . Hypertension   . Kidney stones   . Plantar fascial fibromatosis   . Hyperlipidemia     Patient Active Problem List   Diagnosis Date Noted  . Osteoarthritis of both feet 03/05/2015  . Kidney stones 12/02/2014  . Hypertension 07/02/2014  . Hyperlipidemia 07/02/2014  . Diabetes mellitus without complication (HCC) 07/02/2014    Past Surgical History  Procedure Laterality Date  . Gallbladder surgery    . Cholecystectomy  2006  . Kidney stone surgery      Current Outpatient Rx  Name  Route  Sig  Dispense  Refill  . acetaminophen (TYLENOL) 500 MG tablet   Oral   Take 500 mg by mouth 2 (two) times daily as needed.         [name] aspirin 81 MG tablet   Oral   Take 81 mg by mouth daily.         .  benazepril (LOTENSIN) 40 MG tablet   Oral   Take 1 tablet (40 mg total) by mouth daily.   30 tablet   12   . calcium carbonate (TUMS EX) 750 MG chewable tablet   Oral   Chew 2 tablets by mouth daily as needed.         . cetirizine (ZYRTEC) 10 MG tablet   Oral   Take 10 mg by mouth daily as needed.         . hydrochlorothiazide (MICROZIDE) 12.5 MG capsule   Oral   Take 1 capsule (12.5 mg total) by mouth daily.   30 capsule   12   . ketoconazole (NIZORAL) 2 % cream      APPLY TO AFFECTED AREA OF GROIN TWICE A DAY AS NEEDED      0   . lovastatin (MEVACOR) 20 MG tablet   Oral   Take 2 tablets (40 mg total) by mouth at bedtime.   30 tablet   12   . metFORMIN (GLUCOPHAGE) 500 MG tablet   Oral   Take 1 tablet (500 mg total) by mouth 2 times daily at 12 noon and 4 pm.   60 tablet   12   . metoprolol (LOPRESSOR) 100 MG tablet   Oral   Take 1 tablet (100 mg total) by mouth 2 (two) times daily.   60 tablet  12   . naproxen (NAPROSYN) 500 MG tablet   Oral   Take 1 tablet (500 mg total) by mouth 2 (two) times daily.   60 tablet   3   . naproxen (NAPROSYN) 500 MG tablet   Oral   Take 1 tablet (500 mg total) by mouth 2 (two) times daily with a meal.   60 tablet   3   . ONE TOUCH ULTRA TEST test strip      TEST once daily as directed   100 each   12   . ONETOUCH DELICA LANCETS FINE MISC      TEST once daily as directed   100 each   0   . tamsulosin (FLOMAX) 0.4 MG CAPS capsule   Oral   Take 1 capsule (0.4 mg total) by mouth daily.   30 capsule   12   . traMADol (ULTRAM) 50 MG tablet      take 1 tablet by mouth once daily if needed   90 tablet   0     Allergies Penicillins  Family History  Problem Relation Age of Onset  . Diabetes Maternal Uncle     Social History Social History  Substance Use Topics  . Smoking status: Never Smoker   . Smokeless tobacco: Never Used  . Alcohol Use: 0.0 oz/week    0 Standard drinks or equivalent per  week     Comment: Occasionally drinks beer    Review of Systems Constitutional: No fever/chills. No lightheadedness or syncope. Eyes: No visual changes. ENT: No sore throat. Cardiovascular: Positive  chest pain, without palpitations. Respiratory: Positive "heavy breathing," but no shortness of breath No cough. Gastrointestinal: No abdominal pain.  No nausea, no vomiting.  No diarrhea.  No constipation. Genitourinary: Negative for dysuria. Musculoskeletal: Negative for back pain. No lower extremity swelling. Skin: Negative for rash. Neurological: Negative for headaches, focal weakness or numbness.  10-point ROS otherwise negative.  ____________________________________________   PHYSICAL EXAM:  VITAL SIGNS: ED Triage Vitals  Enc Vitals Group     BP --      Pulse --      Resp --      Temp --      Temp src --      SpO2 --      Weight 04/12/15 0913 248 lb (112.492 kg)     Height 04/12/15 0913  (1.702 m)     Head Cir --      Peak Flow --      Pain Score 04/12/15 0914 10     Pain Loc --      Pain Edu? --      Excl. in GC? --     Constitutional: Alert and oriented. Well appearing and in no acute distress. Answer question appropriately. Eyes: Conjunctivae are normal.  EOMI. Head: Atraumatic. Nose: No congestion/rhinnorhea. Mouth/Throat: Mucous membranes are moist.  Neck: No stridor.  Supple.  No JVD. Cardiovascular: Normal rate, regular rhythm.2/6 holosystolic murmur without rubs or gallops.  Respiratory: Normal respiratory effort.  No retractions. Lungs CTAB.  No wheezes, rales or ronchi. Gastrointestinal: Obese. Soft and nontender. No distention. No peritoneal signs. Musculoskeletal: No LE edema. No palpable cords or tenderness to palpation in the calves. Negative Homans sign. Neurologic:  Normal speech and language. No gross focal neurologic deficits are appreciated.  Skin:  Skin is warm, dry and intact. No rash noted. Psychiatric: Mood and affect are normal.  Speech and behavior are normal.  Normal judgement.  ____________________________________________   LABS (all labs ordered are listed, but only abnormal results are displayed)  Labs Reviewed  CBC - Abnormal; Notable for the following:    RBC 4.10 (*)    Hemoglobin 11.7 (*)    HCT 34.1 (*)    RDW 15.1 (*)    All other components within normal limits  BASIC METABOLIC PANEL - Abnormal; Notable for the following:    Sodium 133 (*)    Glucose, Bld 104 (*)    BUN 26 (*)    Calcium 8.6 (*)    All other components within normal limits  TROPONIN I  TROPONIN I   ____________________________________________  EKG  ED ECG REPORT I, Rockne MenghiniNorman, Anne-Caroline, the attending physician, personally viewed and interpreted this ECG.   Date: 04/12/2015  EKG Time: 914  Rate: 63  Rhythm: normal sinus rhythm  Axis: Leftward  Intervals:Incomplete right bundle-branch block.  ST&T Change: Nonspecific T-wave inversion in V1. No ST elevation.  ____________________________________________  RADIOLOGY  Dg Chest 2 View  04/12/2015  CLINICAL DATA:  50 year old male with a history of left-sided chest pain. EXAM: CHEST - 2 VIEW COMPARISON:  No prior chest x-ray available. FINDINGS: Cardiomediastinal silhouette projects within normal limits in size and contour. No confluent airspace disease, pneumothorax, or pleural effusion. No displaced fracture. Unremarkable appearance of the upper abdomen. IMPRESSION: No radiographic evidence of acute cardiopulmonary disease. Signed, Yvone NeuJaime S. Loreta AveWagner, DO [facility] Electronically Signed   By: Gilmer MorJaime  Wagner D.O.   On: 04/12/2015 10:13    ____________________________________________   PROCEDURES  Procedure(s) performed: None  Critical Care performed: No ____________________________________________   INITIAL IMPRESSION / ASSESSMENT AND PLAN / ED COURSE  Pertinent labs & imaging results that were available  during my care of the patient were reviewed by me and considered in my medical decision making (see chart for details).  50 y.o. male with no history of CAD but significant risk factors including HTN, HL, DM presenting with atypical chest pain. Time, the patient is chest pain-free. He had brief episodes of sharp self resolving chest pain and gives a vague history of some other chest pain that is unclear. It is possible that the patient has CAD and we will evaluate for acute MI. His EKG does not show any evidence of ischemia. Other etiologies include muscle strain given his exertional type of work, or esophageal spasm area consider GERD although he does not attribute any changes around eating. If the patient's workup is negative, we'll get a second troponin here today. He will benefit from a risk stratification study at this time and will do this as an outpatient as long as his emergency Department evaluation is reassuring and he remains chest pain free.  ----------------------------------------- 1:28 PM on 04/12/2015 -----------------------------------------  The patient's repeat troponin is also negative. At this time, the patient is symptom-free. His workup in the emergency department is reassuring and will have him follow-up with Dr. Romeo Appleonn shortly. Plan discharge. Return precautions as well as follow-up instructions were discussed. ____________________________________________  FINAL CLINICAL IMPRESSION(S) / ED DIAGNOSES  Final diagnoses:  Chest pain, unspecified chest pain type      NEW MEDICATIONS STARTED DURING THIS VISIT:  New Prescriptions   No medications on file     Rockne MenghiniAnne-Caroline Addyson Traub, MD 04/12/15 1328

## 2015-04-17 ENCOUNTER — Encounter: Payer: Self-pay | Admitting: Podiatry

## 2015-04-17 ENCOUNTER — Ambulatory Visit (INDEPENDENT_AMBULATORY_CARE_PROVIDER_SITE_OTHER): Payer: BLUE CROSS/BLUE SHIELD | Admitting: Podiatry

## 2015-04-17 VITALS — BP 146/73 | HR 79 | Resp 18

## 2015-04-17 DIAGNOSIS — M722 Plantar fascial fibromatosis: Secondary | ICD-10-CM

## 2015-04-17 MED ORDER — ACETAMINOPHEN-CODEINE #3 300-30 MG PO TABS: 1.0000 | ORAL_TABLET | ORAL | Status: DC | PRN

## 2015-04-17 NOTE — Patient Instructions (Signed)

## 2015-04-17 NOTE — Progress Notes (Signed)
Patient ID: Joshua Lee, male   DOB: 09/18/1965, 50 y.o.   MRN: [id number]  Subjective: 50 year old male presents the office today for concerns of reoccurring left heel pain. He states he is started to get reoccurrence of pain to the bottom of his left heel. This is started over the last couple of days. Denies any recent injury or trauma. Denies any popping sensation. Denies any increase in swelling or redness. No tingling or numbness. He did have to the hospital over the weekend for chest pain. He has an upcoming stress test. Denies any systemic complaints such as fevers, chills, nausea, vomiting. No acute changes since last appointment, and no other complaints at this time.   Objective: AAO x3, NAD DP/PT pulses palpable bilaterally, CRT less than 3 seconds Tenderness to palpation along the plantar medial tubercle of the calcaneus at the insertion of plantar fascia on the left foot. There is no pain along the course of the plantar fascia within the arch of the foot. Plantar fascia appears to be intact. There is no pain with lateral compression of the calcaneus or pain with vibratory sensation. There is no pain along the course or insertion of the achilles tendon. No other areas of tenderness to bilateral lower extremities. No pain to the right foot at this time.  No areas of pinpoint bony tenderness or pain with vibratory sensation. MMT 5/5, ROM WNL. No edema, erythema, increase in warmth to bilateral lower extremities.  No open lesions or pre-ulcerative lesions.  No pain with calf compression, swelling, warmth, erythema  Assessment: 50 year old male left heel pain, likely plantar fasciitis recurrence  Plan: -All treatment options discussed with the patient including all alternatives, risks, complications.  -Patient requesting steroid injection today. Under sterile conditions a mixture of Kenalog and local anesthetic was infiltrated into the area of maximal tenderness to any complications.  Post injection care was discussed with the patient. -He has not beenstretching exercises or any other treatment. Recommend stretching, icing as well as shoe gear modifications. He has orthotics in his work boots recommended to wear these all the time. -He was saying that he would like to have something for pain. Given he is diabetic with hold off on oral steroids. He has some question a cardiac issues will hold off on anti-inflammatories. Prescribed tylenol #3. -F/U 4 weeks -Patient encouraged to call the office with any questions, concerns, change in symptoms.   Joshua Lee, DPM

## 2015-04-22 ENCOUNTER — Telehealth: Payer: Self-pay | Admitting: *Deleted

## 2015-04-22 MED ORDER — NONFORMULARY OR COMPOUNDED ITEM: Status: DC

## 2015-04-22 NOTE — Telephone Encounter (Signed)
Dr. Ardelle AntonWagoner ordered [employer] Combo Pain #1. Faxed 04/18/2015.

## 2015-04-24 ENCOUNTER — Encounter: Payer: Self-pay | Admitting: Podiatry

## 2015-04-24 ENCOUNTER — Ambulatory Visit (INDEPENDENT_AMBULATORY_CARE_PROVIDER_SITE_OTHER): Payer: BLUE CROSS/BLUE SHIELD | Admitting: Podiatry

## 2015-04-24 ENCOUNTER — Ambulatory Visit: Payer: BLUE CROSS/BLUE SHIELD

## 2015-04-24 VITALS — BP 179/87 | HR 79 | Resp 18

## 2015-04-24 DIAGNOSIS — M722 Plantar fascial fibromatosis: Secondary | ICD-10-CM

## 2015-04-24 DIAGNOSIS — R52 Pain, unspecified: Secondary | ICD-10-CM | POA: Diagnosis not present

## 2015-04-24 NOTE — Progress Notes (Signed)
Patient ID: Joshua Lee, male   DOB: 01/08/1966, 50 y.o.   MRN: [id number]  Subjective: Joshua Lee presents to the office today for follow-up evaluation of left heel pain. He does that he is doing better today although he does continue some pain to the heel. He states he was doing well to the injection however here to get a stress test done doing a lot of walking on a treadmill and after doing that he did have some increase in pain to his heel. Denies any numbness or tingling. Denies he swelling or redness. He has been stretching intermittently but not continuously. He does ice. Pain does not wake him up at night. Denies any claudication symptoms. No other complaints at this time. No acute changes since last appointment. They deny any systemic complaints such as fevers, chills, nausea, vomiting.  Objective: General: AAO x3, NAD  Dermatological: Skin is warm, dry and supple bilateral. Nails x 10 are well manicured; remaining integument appears unremarkable at this time. There are no open sores, no preulcerative lesions, no rash or signs of infection present.  Vascular: Dorsalis Pedis artery and Posterior Tibial artery pedal pulses are 2/4 bilateral with immedate capillary fill time. Pedal hair growth present. There is no pain with calf compression, swelling, warmth, erythema.   Neruologic: Grossly intact via light touch bilateral. Vibratory intact via tuning fork bilateral. Protective threshold with Semmes Wienstein monofilament intact to all pedal sites bilateral.   Musculoskeletal: There is decreased, but continued tenderness palpation along the plantar medial tubercle of the calcaneus at the insertion of the plantar fascia on the left foot. There is no pain along the course of the plantar fascia within the arch of the foot. Plantar fascia appears to be intact bilaterally. There is no pain with lateral compression of the calcaneus and there is no pain with vibratory sensation. There is no  pain along the course or insertion of the Achilles tendon. There are no other areas of tenderness to bilateral lower extremities. No gross boney pedal deformities bilateral. No pain, crepitus, or limitation noted with foot and ankle range of motion bilateral. Muscular strength 5/5 in all groups tested bilateral.  Gait: Unassisted, Nonantalgic.   Assessment: Presents for follow-up evaluation for heel pain, likely plantar fasciitis   Plan: -Treatment options discussed including all alternatives, risks, and complications -Patient was very adamant another steroid injection. Discussed with him risks, occasions or repeat steroid injection this point he understands these risks  (including rupture) and wishes to proceed. Under sterile conditions a mixture of Celestone Soluspan as well as local anesthetic was infiltrated into the area of maximal tendons without complications. I did this away from the area of the previous injection. Post injection care was discussed. -Discussed them immobilization in a CAM boot but he states he cannot do this with work. He will not wear the boot. -Plantar fascial taping was applied. -Continue plantar fascial braces well.  -Ice and stretching exercises on a daily basis. -Continue supportive shoe gear. -Follow-up in 3 weeks or sooner if any problems arise. In the meantime, encouraged to call the office with any questions, concerns, change in symptoms.   Ovid CurdMatthew Wagoner, DPM

## 2015-05-11 ENCOUNTER — Other Ambulatory Visit: Payer: Self-pay | Admitting: Podiatry

## 2015-05-12 ENCOUNTER — Telehealth: Payer: Self-pay | Admitting: *Deleted

## 2015-05-12 ENCOUNTER — Other Ambulatory Visit: Payer: Self-pay

## 2015-05-12 MED ORDER — TRAMADOL HCL 50 MG PO TABS: 50.0000 mg | ORAL_TABLET | Freq: Every day | ORAL | Status: DC | PRN

## 2015-05-12 NOTE — Telephone Encounter (Signed)
Pt requested refill of Tylenol#3.  Dr. Bary CastillaWagoner okayed refill as previously.  [place].

## 2015-05-14 ENCOUNTER — Other Ambulatory Visit: Payer: Self-pay | Admitting: Podiatry

## 2015-05-14 MED ORDER — ACETAMINOPHEN-CODEINE #3 300-30 MG PO TABS: 1.0000 | ORAL_TABLET | Freq: Three times a day (TID) | ORAL | Status: DC | PRN

## 2015-05-15 ENCOUNTER — Encounter: Payer: Self-pay | Admitting: Podiatry

## 2015-05-15 ENCOUNTER — Ambulatory Visit (INDEPENDENT_AMBULATORY_CARE_PROVIDER_SITE_OTHER): Payer: BLUE CROSS/BLUE SHIELD | Admitting: Podiatry

## 2015-05-15 ENCOUNTER — Ambulatory Visit: Payer: BLUE CROSS/BLUE SHIELD | Admitting: Podiatry

## 2015-05-15 VITALS — BP 113/61 | HR 83 | Resp 18

## 2015-05-15 DIAGNOSIS — M722 Plantar fascial fibromatosis: Secondary | ICD-10-CM

## 2015-05-15 NOTE — Patient Instructions (Signed)

## 2015-05-19 ENCOUNTER — Encounter: Payer: Self-pay | Admitting: Podiatry

## 2015-05-19 NOTE — Progress Notes (Signed)
Patient ID: Joshua Lee, male   DOB: 07/24/1965, 50 y.o.   MRN: [id number]  Subjective: 50 year old male presents the also for follow-up evaluation of left heel pain. He states the pain is doing much better today the wound has been. He wishes to hold off on any steroid injection or taping to his foot at this point since he is doing well. He has been stretching and icing as well as using the plantar fascial brace. No numbness or tingling. The pain does not wake him up at night. He can work throughout the day without much discomfort at this point. Denies any systemic complaints such as fevers, chills, nausea, vomiting. No acute changes since last appointment, and no other complaints at this time.   Objective: AAO x3, NAD DP/PT pulses palpable bilaterally, CRT less than 3 seconds Protective sensation intact with Simms Weinstein monofilament At this time there is mild tenderness to palpation of the plantar medial tubercle of the case and insertional plantar fashion the left foot. There is no pain on the course of the plantar fascial within the arch of the foot. No pain with medial to lateral compression. No areas of pinpoint bony tenderness or pain with vibratory sensation. MMT 5/5, ROM WNL. No edema, erythema, increase in warmth to bilateral lower extremities.  No open lesions or pre-ulcerative lesions.  No pain with calf compression, swelling, warmth, erythema  Assessment: Resolving plantar fascial pain  Plan: -All treatment options discussed with the patient including all alternatives, risks, complications.  -He was started off on any steroid injection or taping at this point. Recommend continue with stretching, icing as well as the plantar fascial brace. Discussed supportive shoe gear and orthotics. -Follow-up with symptoms not completely resolved within 3-4 weeks or sooner if any issues are to arise. -Patient encouraged to call the office with any questions, concerns, change in symptoms.    Joshua Lee, DPM

## 2015-05-28 ENCOUNTER — Telehealth: Payer: Self-pay

## 2015-05-28 NOTE — Telephone Encounter (Signed)
Patient would like Rx for Ibuprofen  [facility] [address]

## 2015-05-28 NOTE — Telephone Encounter (Signed)
[facility] [address] it has been filled at [address] now, also has naproxen Rx.  No Ibuprofen mixed with Naproxen

## 2015-05-28 NOTE — Telephone Encounter (Signed)
Joshua HornsNancy H Lee, CMA at 05/28/2015 5:05 PM     Status: Signed       Expand All Collapse All   Patient would like Rx for Ibuprofen  [facility] [address]       Patient has Rx for Naprosyn and should not mix the 2  take Naprosyn instead

## 2015-06-04 ENCOUNTER — Ambulatory Visit (INDEPENDENT_AMBULATORY_CARE_PROVIDER_SITE_OTHER): Payer: BLUE CROSS/BLUE SHIELD | Admitting: Family Medicine

## 2015-06-04 ENCOUNTER — Encounter: Payer: Self-pay | Admitting: Family Medicine

## 2015-06-04 VITALS — BP 113/72 | HR 74 | Temp 98.0°F | Ht 69.0 in | Wt 253.0 lb

## 2015-06-04 DIAGNOSIS — E119 Type 2 diabetes mellitus without complications: Secondary | ICD-10-CM | POA: Diagnosis not present

## 2015-06-04 DIAGNOSIS — M25551 Pain in right hip: Secondary | ICD-10-CM

## 2015-06-04 DIAGNOSIS — E785 Hyperlipidemia, unspecified: Secondary | ICD-10-CM | POA: Diagnosis not present

## 2015-06-04 DIAGNOSIS — I10 Essential (primary) hypertension: Secondary | ICD-10-CM | POA: Diagnosis not present

## 2015-06-04 LAB — LP+ALT+AST PICCOLO, WAIVED
ALT (SGPT) PICCOLO, WAIVED: 28 U/L (ref 10–47)
AST (SGOT) Piccolo, Waived: 28 U/L (ref 11–38)
CHOLESTEROL PICCOLO, WAIVED: 126 mg/dL (ref <200)
Chol/HDL Ratio Piccolo,Waive: 4 mg/dL
HDL CHOL PICCOLO, WAIVED: 31 mg/dL — AB (ref >59)
LDL Chol Calc Piccolo Waived: 35 mg/dL (ref <100)
Triglycerides Piccolo,Waived: 299 mg/dL — ABNORMAL HIGH (ref <150)
VLDL CHOL CALC PICCOLO,WAIVE: 60 mg/dL — AB (ref <30)

## 2015-06-04 LAB — BAYER DCA HB A1C WAIVED: HB A1C: 5.9 % (ref <7.0)

## 2015-06-04 MED ORDER — TRAMADOL HCL 50 MG PO TABS: 50.0000 mg | ORAL_TABLET | Freq: Every day | ORAL | Status: DC | PRN

## 2015-06-04 NOTE — Progress Notes (Addendum)
BP 113/72 mmHg  Pulse 74  Temp(Src) 98 F (36.7 C)  Ht  (1.753 m)  Wt 253 lb (114.76 kg)  BMI 37.34 kg/m2  SpO2 99%   Subjective:    Patient ID: Joshua Lee, Joshua Lee    DOB: [birth year], 50 y.o.   MRN: [id number]  HPI: Joshua Lee is a 50 y.o. Joshua Lee  Chief Complaint  Patient presents with  . Diabetes  . Hypertension  . 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  Relevant past medical, surgical, family and social history reviewed and updated as indicated. Interim medical history since our last visit reviewed. Allergies and medications reviewed and updated.  Review of Systems  Constitutional: Negative.   Respiratory: Negative.   Cardiovascular: Negative.     Per HPI unless specifically indicated above     Objective:    BP 113/72 mmHg  Pulse 74  Temp(Src) 98 F (36.7 C)  Ht  (1.753 m)  Wt 253 lb (114.76 kg)  BMI 37.34 kg/m2  SpO2 99%  Wt Readings from Last 3 Encounters:  06/04/15 253 lb (114.76 kg)  04/12/15 248 lb (112.492 kg)  03/05/15 248 lb (112.492 kg)    Physical Exam  Constitutional: He is oriented to person, place, and time. He appears well-developed and well-nourished. No distress.  HENT:  Head: Normocephalic and atraumatic.  Right Ear: Hearing normal.  Left Ear: Hearing normal.  Nose: Nose normal.  Eyes: Conjunctivae and lids are normal. Right eye exhibits no discharge. Left eye exhibits no discharge. No scleral icterus.  Cardiovascular: Normal rate, regular rhythm and normal heart sounds.    Pulmonary/Chest: Effort normal and breath sounds normal. No respiratory distress.  Musculoskeletal: Normal range of motion.  Neurological: He is alert and oriented to person, place, and time.  Skin: Skin is intact. No rash noted.  Psychiatric: He has a normal mood and affect. His speech is normal and behavior is normal. Judgment and thought content normal. Cognition and memory are normal.    Results for orders placed or performed during the hospital encounter of 04/12/15  CBC  Result Value Ref Range   WBC 8.4 3.8 - 10.6 K/uL   RBC 4.10 (L) 4.40 - 5.90 MIL/uL   Hemoglobin 11.7 (L) 13.0 - 18.0 g/dL   HCT 44.0 (L) 34.7 - 42.5 %   MCV 83.1 80.0 - 100.0 fL   MCH 28.5 26.0 - 34.0 pg   MCHC 34.3 32.0 - 36.0 g/dL   RDW 95.6 (H) 38.7 - 56.4 %   Platelets 181 150 - 440 K/uL  Basic metabolic panel  Result Value Ref Range   Sodium 133 (L) 135 - 145 mmol/L   Potassium 4.0 3.5 - 5.1 mmol/L   Chloride 101 101 - 111 mmol/L   CO2 24 22 - 32 mmol/L   Glucose, Bld 104 (H) 65 - 99 mg/dL   BUN 26 (H) 6 - 20 mg/dL   Creatinine, Ser 3.32 0.61 - 1.24 mg/dL   Calcium 8.6 (L) 8.9 - 10.3 mg/dL   GFR calc non Af Amer >  60 >60 mL/min   GFR calc Af Amer >60 >60 mL/min   Anion gap 8 5 - 15  Troponin I  Result Value Ref Range   Troponin I <0.03 <0.031 ng/mL  Troponin I  Result Value Ref Range   Troponin I <0.03 <0.031 ng/mL      Assessment & Plan:   Problem List Items Addressed This Visit      Cardiovascular and Mediastinum   Hypertension    The current medical regimen is effective;  continue present plan and medications.       Relevant Medications   atorvastatin (LIPITOR) 80 MG tablet   Other Relevant Orders   LP+ALT+AST Piccolo, Waived   Basic metabolic panel     Endocrine   Diabetes mellitus without complication (HCC) - Primary   Relevant Medications   atorvastatin (LIPITOR) 80 MG tablet   Other Relevant Orders   Bayer DCA Hb A1c Waived     Other   Hyperlipidemia   Relevant  Medications   atorvastatin (LIPITOR) 80 MG tablet   Other Relevant Orders   LP+ALT+AST Piccolo, Waived   Basic metabolic panel    Other Visit Diagnoses    Hip pain, right        Relevant Medications    traMADol (ULTRAM) 50 MG tablet    Other Relevant Orders    Ambulatory referral to Orthopedic Surgery        Follow up plan: Return in about 3 months (around 09/04/2015) for a1c.

## 2015-06-04 NOTE — Assessment & Plan Note (Signed)
The current medical regimen is effective;  continue present plan and medications.  

## 2015-06-04 NOTE — Addendum Note (Signed)
Addended byVonita Moss: Shayann Garbutt on: 06/04/2015 04:30 PM   Modules accepted: Orders

## 2015-06-05 ENCOUNTER — Encounter: Payer: Self-pay | Admitting: Family Medicine

## 2015-06-05 ENCOUNTER — Encounter: Payer: Self-pay | Admitting: Podiatry

## 2015-06-05 ENCOUNTER — Ambulatory Visit (INDEPENDENT_AMBULATORY_CARE_PROVIDER_SITE_OTHER): Payer: BLUE CROSS/BLUE SHIELD | Admitting: Podiatry

## 2015-06-05 VITALS — BP 131/78 | HR 78 | Resp 18

## 2015-06-05 DIAGNOSIS — M779 Enthesopathy, unspecified: Secondary | ICD-10-CM

## 2015-06-05 DIAGNOSIS — M722 Plantar fascial fibromatosis: Secondary | ICD-10-CM | POA: Diagnosis not present

## 2015-06-05 LAB — BASIC METABOLIC PANEL
BUN / CREAT RATIO: 27 — AB (ref 9–20)
BUN: 23 mg/dL (ref 6–24)
CHLORIDE: 99 mmol/L (ref 96–106)
CO2: 22 mmol/L (ref 18–29)
CREATININE: 0.85 mg/dL (ref 0.76–1.27)
Calcium: 9.4 mg/dL (ref 8.7–10.2)
GFR calc non Af Amer: 102 mL/min/{1.73_m2} (ref >59)
GFR, EST AFRICAN AMERICAN: 118 mL/min/{1.73_m2} (ref >59)
Glucose: 101 mg/dL — ABNORMAL HIGH (ref 65–99)
POTASSIUM: 4.1 mmol/L (ref 3.5–5.2)
SODIUM: 140 mmol/L (ref 134–144)

## 2015-06-05 MED ORDER — IBUPROFEN 800 MG PO TABS: 800.0000 mg | ORAL_TABLET | Freq: Three times a day (TID) | ORAL | Status: DC | PRN

## 2015-06-05 NOTE — Patient Instructions (Signed)
Do NOT take naproxen or other antiinflammatories (motrin, Advil, etc.) when taking ibuprofen.

## 2015-06-06 NOTE — Progress Notes (Signed)
Patient ID: Joshua Lee, male   DOB: 01/15/1966, [age] y.o.   MRN: [id number]  Subjective: 50 year old male presents the office today for pain to the top of his left foot which she believes started over the weekend but he is unsure. He states it is lasting and at work. He was this may be a result of contracted pressure off the heel. He said his heel is doing better and has very minimal discomfort however. No recent injury or trauma. No treatment for this area. No numbness or tingling. Denies any systemic complaints such as fevers, chills, nausea, vomiting. No acute changes since last appointment, and no other complaints at this time.   Objective: AAO x3, NAD DP/PT pulses palpable bilaterally, CRT less than 3 seconds Protective sensation intact with Dorann OuSimms Weinstein monofilament There is mild discomfort on the course the Lisfranc complex at approximately level of the third metatarsal cuneiform joint. There is no pain vibratory sensation overlying this area. There is a very trace amount of edema to the dorsal aspect of the foot without any erythema or increase in warmth. There is no other areas of pinpoint bony tenderness. There is very minimal discomfort on the plantar medial tubercle the calcaneus insertion of plantar fashion the left foot there is no pain in the right foot. No other areas of tenderness bilaterally.  MMT 5/5, ROM WNL. No edema, erythema, increase in warmth to bilateral lower extremities.  No open lesions or pre-ulcerative lesions.  No pain with calf compression, swelling, warmth, erythema  Assessment: Capsulitis left foot with resolving plantar fasciitis  Plan: -All treatment options discussed with the patient including all alternatives, risks, complications.  -Patient declined  x-rays today. -I discussed steroid injection to the dorsal aspect of the foot on the area maximal tenderness. He wishes to proceed. Under sterile conditions a mixture of dexamethasone phosphate and local  anesthetic was infiltrated without complications. Post injection care was discussed. Dispensed anklet to help with swelling. Continue plantar fascial brace as well as stretching, icing for the plantar fasciitis which she has not been to be. He started treatment for plantar fasciitis as he states he was doing better. -Directed and follow-up in 3-4 weeks however he states that he will call. -Patient encouraged to call the office with any questions, concerns, change in symptoms.   Ovid CurdMatthew Wagoner, DPM

## 2015-06-14 ENCOUNTER — Emergency Department
Admission: EM | Admit: 2015-06-14 | Discharge: 2015-06-14 | Disposition: A | Discharge status: Home / Self Care | Payer: BLUE CROSS/BLUE SHIELD | Attending: Emergency Medicine | Admitting: Emergency Medicine

## 2015-06-14 ENCOUNTER — Encounter: Payer: Self-pay | Admitting: Emergency Medicine

## 2015-06-14 ENCOUNTER — Emergency Department: Payer: BLUE CROSS/BLUE SHIELD

## 2015-06-14 DIAGNOSIS — I10 Essential (primary) hypertension: Secondary | ICD-10-CM | POA: Diagnosis not present

## 2015-06-14 DIAGNOSIS — Z79899 Other long term (current) drug therapy: Secondary | ICD-10-CM | POA: Insufficient documentation

## 2015-06-14 DIAGNOSIS — E785 Hyperlipidemia, unspecified: Secondary | ICD-10-CM | POA: Diagnosis not present

## 2015-06-14 DIAGNOSIS — M79672 Pain in left foot: Secondary | ICD-10-CM | POA: Insufficient documentation

## 2015-06-14 DIAGNOSIS — Z7982 Long term (current) use of aspirin: Secondary | ICD-10-CM | POA: Diagnosis not present

## 2015-06-14 DIAGNOSIS — Z7984 Long term (current) use of oral hypoglycemic drugs: Secondary | ICD-10-CM | POA: Insufficient documentation

## 2015-06-14 MED ORDER — HYDROCODONE-ACETAMINOPHEN 5-325 MG PO TABS: 1.0000 | ORAL_TABLET | Freq: Four times a day (QID) | ORAL | Status: DC | PRN

## 2015-06-14 MED ORDER — PREDNISONE 10 MG PO TABS: 10.0000 mg | ORAL_TABLET | Freq: Every day | ORAL | Status: DC

## 2015-06-14 NOTE — ED Provider Notes (Signed)
CSN: [id number]     Arrival date & time 06/14/15  1442 History   First MD Initiated Contact with Patient 06/14/15 1528     Chief Complaint  Patient presents with  . Foot Pain     (Consider location/radiation/quality/duration/timing/severity/associated sxs/prior Treatment) HPI  50 year old male presents to the emergency department for evaluation of left foot pain. Patient's had 3 weeks of left foot pain with no trauma known injury. He was seen by podiatrist one week ago for 10 out of 10 pain along the dorsal aspect of his left foot along the mid second and third metatarsal. Patient states he was given a cortisone injection which initially gave him some relief. Pain has returned and is severe 10 out of 10.Pain is sharp, increased with weightbearing activity. He works 10 hour days and still toe boots. He does not have a follow-up with podiatry. Patient denies having any x-rays of the left foot. He does not have a history of gout and denies any warmth redness or fevers. He denies any calf pain. The pain in his left foot is sharp and increased with weightbearing activities and wearing shoes.  Past Medical History  Diagnosis Date  . Hypertension   . Kidney stones   . Plantar fascial fibromatosis   . Hyperlipidemia    Past Surgical History  Procedure Laterality Date  . Gallbladder surgery    . Cholecystectomy  2006  . Kidney stone surgery     Family History  Problem Relation Age of Onset  . Diabetes Maternal Uncle    Social History  Substance Use Topics  . Smoking status: Never Smoker   . Smokeless tobacco: Never Used  . Alcohol Use: 0.0 oz/week    0 Standard drinks or equivalent per week     Comment: Occasionally drinks beer    Review of Systems  Constitutional: Negative.  Negative for fever, chills, activity change and appetite change.  HENT: Negative for congestion, ear pain, mouth sores, rhinorrhea, sinus pressure, sore throat and trouble swallowing.   Eyes: Negative for  photophobia, pain and discharge.  Respiratory: Negative for cough, chest tightness and shortness of breath.   Cardiovascular: Negative for chest pain and leg swelling.  Gastrointestinal: Negative for nausea, vomiting, abdominal pain, diarrhea and abdominal distention.  Genitourinary: Negative for dysuria and difficulty urinating.  Musculoskeletal: Positive for arthralgias. Negative for back pain and gait problem.  Skin: Negative for color change and rash.  Neurological: Negative for dizziness and headaches.  Hematological: Negative for adenopathy.  Psychiatric/Behavioral: Negative for behavioral problems and agitation.      Allergies  Penicillins  Home Medications   Prior to Admission medications   Medication Sig Start Date End Date Taking? Authorizing Provider  acetaminophen (TYLENOL) 500 MG tablet Take 500 mg by mouth 2 (two) times daily as needed.    Historical Provider, MD  acetaminophen-codeine (TYLENOL #3) 300-30 MG tablet Take 1 tablet by mouth every 8 (eight) hours as needed for moderate pain. 05/14/15   Max T Hyatt, DPM  aspirin 81 MG tablet Take 81 mg by mouth daily.    Historical Provider, MD  atorvastatin (LIPITOR) 80 MG tablet Take 80 mg by mouth daily.    Historical Provider, MD  benazepril (LOTENSIN) 40 MG tablet Take 1 tablet (40 mg total) by mouth daily. 12/02/14   Steele Sizer, MD  calcium carbonate (TUMS EX) 750 MG chewable tablet Chew 2 tablets by mouth daily as needed.    Historical Provider, MD  cetirizine (ZYRTEC) 10 MG  tablet Take 10 mg by mouth daily as needed.    Historical Provider, MD  hydrochlorothiazide (MICROZIDE) 12.5 MG capsule Take 1 capsule (12.5 mg total) by mouth daily. 12/02/14   Steele SizerMark A Crissman, MD  HYDROcodone-acetaminophen (NORCO) 5-325 MG tablet Take 1 tablet by mouth every 6 (six) hours as needed for moderate pain. 06/14/15   Evon Slackhomas C Gaines, PA-C  ibuprofen (ADVIL,MOTRIN) 800 MG tablet Take 1 tablet (800 mg total) by mouth every 8 (eight)  hours as needed. 06/05/15   Vivi BarrackMatthew R Wagoner, DPM  ketoconazole (NIZORAL) 2 % cream APPLY TO AFFECTED AREA OF GROIN TWICE A DAY AS NEEDED 11/11/14   Historical Provider, MD  metFORMIN (GLUCOPHAGE) 500 MG tablet Take 1 tablet (500 mg total) by mouth 2 times daily at 12 noon and 4 pm. 12/02/14   Steele SizerMark A Crissman, MD  metoprolol (LOPRESSOR) 100 MG tablet Take 1 tablet (100 mg total) by mouth 2 (two) times daily. 12/02/14   Steele SizerMark A Crissman, MD  NONFORMULARY OR COMPOUNDED ITEM [facility]:  Combo Pain #1 - Aripiprazole 0.5%, Baclofen 3%, Gabapentin 8%, Ketorolac 8%, Lidocaine 5%, dispense 240 grams, apply 1-2 grams to affected area 3-4 times a day, refill prn. 04/22/15   Vivi BarrackMatthew R Wagoner, DPM  ONE TOUCH ULTRA TEST test strip TEST once daily as directed 08/12/14   Steele SizerMark A Crissman, MD  [facility] FINE MISC TEST once daily as directed 08/16/14   Gabriel Cirriheryl Wicker, NP  predniSONE (DELTASONE) 10 MG tablet Take 1 tablet (10 mg total) by mouth daily. 6,5,4,3,2,1 six day taper 06/14/15   Evon Slackhomas C Gaines, PA-C  tamsulosin (FLOMAX) 0.4 MG CAPS capsule Take 1 capsule (0.4 mg total) by mouth daily. 12/02/14   Steele SizerMark A Crissman, MD  traMADol (ULTRAM) 50 MG tablet Take 1 tablet (50 mg total) by mouth daily as needed. 06/04/15   Steele SizerMark A Crissman, MD   BP 125/90 mmHg  Pulse 78  Temp(Src) 98 F (36.7 C) (Oral)  Resp 20  Ht 5\' 8"  (1.727 m)  Wt 114.76 kg  BMI 38.48 kg/m2  SpO2 94% Physical Exam  Constitutional: He is oriented to person, place, and time. He appears well-developed and well-nourished.  HENT:  Head: Normocephalic and atraumatic.  Eyes: Conjunctivae and EOM are normal. Pupils are equal, round, and reactive to light.  Neck: Normal range of motion. Neck supple.  Cardiovascular: Normal rate and intact distal pulses.   Pulmonary/Chest: Effort normal. No respiratory distress.  Musculoskeletal:  Examination of the left foot and lower extremity shows patient has no swelling or edema throughout the  calf. He has full active and passive range of motion of the ankle and toes. There is no warmth or erythema. There is no fluctuance. Patient is tender to palpation along the dorsal aspect of the mid second and third metatarsal. He is nontender along the plantar aspect of the foot. 2+ dorsalis pedis pulses present.  Neurological: He is alert and oriented to person, place, and time.  Skin: Skin is warm and dry.  Psychiatric: He has a normal mood and affect. His behavior is normal. Judgment and thought content normal.    ED Course  Procedures (including critical care time) Labs Review Labs Reviewed - No data to display  Imaging Review Dg Foot Complete Left  06/14/2015  CLINICAL DATA:  Left foot pain for 3 weeks. EXAM: LEFT FOOT - COMPLETE 3+ VIEW COMPARISON:  None. FINDINGS: There is no evidence of fracture or dislocation. There is no evidence of arthropathy or  other focal bone abnormality. Soft tissues are unremarkable. IMPRESSION: No acute osseous injury of the left foot. Electronically Signed   By: Elige Ko   On: 06/14/2015 15:58   I have personally reviewed and evaluated these images and lab results as part of my medical decision-making.   EKG Interpretation None      MDM   Final diagnoses:  Left foot pain    50 year old male with left foot pain. No signs of infection or gout. X-ray of the left foot show no evidence of acute bony abnormality or arthropathy. Patient is advised to limit weightbearing activities. Rest ice and elevate the foot. Display some 6 a steroid taper, will hold ibuprofen for the 6 days. Is also given Norco 5-3 25, one tab by mouth 3 times a day when necessary pain quantity #10 was 0 refills. Patient will follow-up with podiatry.    Evon Slack, PA-C 06/14/15 1632  Myrna Blazer, MD 06/15/15 [phone]

## 2015-06-14 NOTE — ED Notes (Signed)
Pt to ed with c/o left foot pain x 3 weeks.  Pt was seen at podiatrist and given injection into foot without relief.

## 2015-06-14 NOTE — Discharge Instructions (Signed)
Cryotherapy °Cryotherapy means treatment with cold. Ice or gel packs can be used to reduce both pain and swelling. Ice is the most helpful within the first 24 to 48 hours after an injury or flare-up from overusing a muscle or joint. Sprains, strains, spasms, burning pain, shooting pain, and aches can all be eased with ice. Ice can also be used when recovering from surgery. Ice is effective, has very few side effects, and is safe for most people to use. °PRECAUTIONS  °Ice is not a safe treatment option for people with: °· Raynaud phenomenon. This is a condition affecting small blood vessels in the extremities. Exposure to cold may cause your problems to return. °· Cold hypersensitivity. There are many forms of cold hypersensitivity, including: °¨ Cold urticaria. Red, itchy hives appear on the skin when the tissues begin to warm after being iced. °¨ Cold erythema. This is a red, itchy rash caused by exposure to cold. °¨ Cold hemoglobinuria. Red blood cells break down when the tissues begin to warm after being iced. The hemoglobin that carry oxygen are passed into the urine because they cannot combine with blood proteins fast enough. °· Numbness or altered sensitivity in the area being iced. °If you have any of the following conditions, do not use ice until you have discussed cryotherapy with your caregiver: °· Heart conditions, such as arrhythmia, angina, or chronic heart disease. °· High blood pressure. °· Healing wounds or open skin in the area being iced. °· Current infections. °· Rheumatoid arthritis. °· Poor circulation. °· Diabetes. °Ice slows the blood flow in the region it is applied. This is beneficial when trying to stop inflamed tissues from spreading irritating chemicals to surrounding tissues. However, if you expose your skin to cold temperatures for too long or without the proper protection, you can damage your skin or nerves. Watch for signs of skin damage due to cold. °HOME CARE INSTRUCTIONS °Follow  these tips to use ice and cold packs safely. °· Place a dry or damp towel between the ice and skin. A damp towel will cool the skin more quickly, so you may need to shorten the time that the ice is used. °· For a more rapid response, add gentle compression to the ice. °· Ice for no more than 10 to 20 minutes at a time. The bonier the area you are icing, the less time it will take to get the benefits of ice. °· Check your skin after 5 minutes to make sure there are no signs of a poor response to cold or skin damage. °· Rest 20 minutes or more between uses. °· Once your skin is numb, you can end your treatment. You can test numbness by very lightly touching your skin. The touch should be so light that you do not see the skin dimple from the pressure of your fingertip. When using ice, most people will feel these normal sensations in this order: cold, burning, aching, and numbness. °· Do not use ice on someone who cannot communicate their responses to pain, such as small children or people with dementia. °HOW TO MAKE AN ICE PACK °Ice packs are the most common way to use ice therapy. Other methods include ice massage, ice baths, and cryosprays. Muscle creams that cause a cold, tingly feeling do not offer the same benefits that ice offers and should not be used as a substitute unless recommended by your caregiver. °To make an ice pack, do one of the following: °· Place crushed ice or a   bag of frozen vegetables in a sealable plastic bag. Squeeze out the excess air. Place this bag inside another plastic bag. Slide the bag into a pillowcase or place a damp towel between your skin and the bag. °· Mix 3 parts water with 1 part rubbing alcohol. Freeze the mixture in a sealable plastic bag. When you remove the mixture from the freezer, it will be slushy. Squeeze out the excess air. Place this bag inside another plastic bag. Slide the bag into a pillowcase or place a damp towel between your skin and the bag. °SEEK MEDICAL CARE  IF: °· You develop white spots on your skin. This may give the skin a blotchy (mottled) appearance. °· Your skin turns blue or pale. °· Your skin becomes waxy or hard. °· Your swelling gets worse. °MAKE SURE YOU:  °· Understand these instructions. °· Will watch your condition. °· Will get help right away if you are not doing well or get worse. °  °This information is not intended to replace advice given to you by your health care provider. Make sure you discuss any questions you have with your health care provider. °  °Document Released: 08/31/2010 Document Revised: 01/25/2014 Document Reviewed: 08/31/2010 °Elsevier Interactive Patient Education ©2016 [employer] ° °Musculoskeletal Pain °Musculoskeletal pain is muscle and boney aches and pains. These pains can occur in any part of the body. Your caregiver may treat you without knowing the cause of the pain. They may treat you if blood or urine tests, X-rays, and other tests were normal.  °CAUSES °There is often not a definite cause or reason for these pains. These pains may be caused by a type of germ (virus). The discomfort may also come from overuse. Overuse includes working out too hard when your body is not fit. Boney aches also come from weather changes. Bone is sensitive to atmospheric pressure changes. °HOME CARE INSTRUCTIONS  °· Ask when your test results will be ready. Make sure you get your test results. °· Only take over-the-counter or prescription medicines for pain, discomfort, or fever as directed by your caregiver. If you were given medications for your condition, do not drive, operate machinery or power tools, or sign legal documents for 24 hours. Do not drink alcohol. Do not take sleeping pills or other medications that may interfere with treatment. °· Continue all activities unless the activities cause more pain. When the pain lessens, slowly resume normal activities. Gradually increase the intensity and duration of the activities or  exercise. °· During periods of severe pain, bed rest may be helpful. Lay or sit in any position that is comfortable. °· Putting ice on the injured area. °¨ Put ice in a bag. °¨ Place a towel between your skin and the bag. °¨ Leave the ice on for 15 to 20 minutes, 3 to 4 times a day. °· Follow up with your caregiver for continued problems and no reason can be found for the pain. If the pain becomes worse or does not go away, it may be necessary to repeat tests or do additional testing. Your caregiver may need to look further for a possible cause. °SEEK IMMEDIATE MEDICAL CARE IF: °· You have pain that is getting worse and is not relieved by medications. °· You develop chest pain that is associated with shortness or breath, sweating, feeling sick to your stomach (nauseous), or throw up (vomit). °· Your pain becomes localized to the abdomen. °· You develop any new symptoms that seem different or that concern   you. MAKE SURE YOU:   Understand these instructions.  Will watch your condition.  Will get help right away if you are not doing well or get worse.   This information is not intended to replace advice given to you by your health care provider. Make sure you discuss any questions you have with your health care provider.   Document Released: 01/04/2005 Document Revised: 03/29/2011 Document Reviewed: 09/08/2012 Elsevier Interactive Patient Education 2016 [employer] Inc[employer] Please avoid weightbearing activity and rest ice and elevate the left foot. Follow-up with podiatry. Return to emergency department for any worsening symptoms urgent changes in her health.

## 2015-06-20 ENCOUNTER — Other Ambulatory Visit: Payer: Self-pay | Admitting: Podiatry

## 2015-06-24 ENCOUNTER — Ambulatory Visit (INDEPENDENT_AMBULATORY_CARE_PROVIDER_SITE_OTHER): Payer: BLUE CROSS/BLUE SHIELD | Admitting: Podiatry

## 2015-06-24 ENCOUNTER — Encounter: Payer: Self-pay | Admitting: Podiatry

## 2015-06-24 DIAGNOSIS — M779 Enthesopathy, unspecified: Secondary | ICD-10-CM

## 2015-06-24 DIAGNOSIS — M19072 Primary osteoarthritis, left ankle and foot: Secondary | ICD-10-CM

## 2015-06-24 MED ORDER — ACETAMINOPHEN-CODEINE #3 300-30 MG PO TABS: 1.0000 | ORAL_TABLET | Freq: Four times a day (QID) | ORAL | Status: DC | PRN

## 2015-06-24 MED ORDER — IBUPROFEN 800 MG PO TABS: 800.0000 mg | ORAL_TABLET | Freq: Three times a day (TID) | ORAL | Status: DC | PRN

## 2015-06-24 NOTE — Progress Notes (Signed)
Patient ID: Joshua Lee, male   DOB: 04/20/1965, 50 y.o.   MRN: [id number]  Subjective: 50 year old male presents the office they for recurrence of left foot pain. His last July he did go to the emergency room he was given some pain medicine which help. He was also given a Medrol Dosepak which she is unsure if that helped. After last appointment with me he did have the steroid injection he did well from and he had improved pain. He states he works several hours a day and steel toed shoes on his feet. He wears old inserts in his shoes. He states the pain the bottom of the heels have pretty much resolved he continues to still have pain to the top of his foot. No recent injury. The swelling has improved. No erythema.Denies any systemic complaints such as fevers, chills, nausea, vomiting. No acute changes since last appointment, and no other complaints at this time.   Objective: AAO x3, NAD DP/PT pulses palpable bilaterally, CRT less than 3 seconds There is continuation tenderness on the dorsal midfoot on the third metatarsal cuneiform joint. There is localized edema to the area any erythema or increase in warmth. There is no fluctuation or crepitus. No malodor. No open sore. No other areas of tenderness bilateral. No edema, erythema, increase in warmth to bilateral lower extremities.  No open lesions or pre-ulcerative lesions.  No pain with calf compression, swelling, warmth, erythema  Assessment: Continuation left foot pain dorsal midfoot  Plan: -All treatment options discussed with the patient including all alternatives, risks, complications.  -I discussed and the surgical shoe however he declined given work. -I try to graphite insert in his shoes. He did not like this. -He wears an old pair of sneakers and the plastic is worn through. I discussed the new shoes. -He also brought and multiple inserts with him. The inserts that feel the best he does not wear to work. I took out the inserts that  he has in his work boots and replacement with the inserts that are more comfortable. I also discussed the release and the shoes and also different shoes. -Continue ibuprofen which is refilled today. Glenna Durand-Tong #3 as needed. Discussed he cannot work or drive while taking pain medicine he verbally understood this -Follow-up 4 weeks. -Patient encouraged to call the office with any questions, concerns, change in symptoms.   Ovid CurdMatthew Wagoner, DPM

## 2015-07-02 ENCOUNTER — Encounter
Admission: RE | Admit: 2015-07-02 | Discharge: 2015-07-02 | Disposition: A | Discharge status: Home / Self Care | Payer: BLUE CROSS/BLUE SHIELD | Source: Ambulatory Visit | Attending: Orthopedic Surgery | Admitting: Orthopedic Surgery

## 2015-07-02 DIAGNOSIS — Z01812 Encounter for preprocedural laboratory examination: Secondary | ICD-10-CM | POA: Diagnosis not present

## 2015-07-02 HISTORY — DX: Type 2 diabetes mellitus without complications: E11.9

## 2015-07-02 HISTORY — DX: Gastro-esophageal reflux disease without esophagitis: K21.9

## 2015-07-02 LAB — URINALYSIS COMPLETE WITH MICROSCOPIC (ARMC ONLY)
Bilirubin Urine: NEGATIVE
Glucose, UA: 50 mg/dL — AB
Hgb urine dipstick: NEGATIVE
Leukocytes, UA: NEGATIVE
Nitrite: NEGATIVE
PROTEIN: 30 mg/dL — AB
SPECIFIC GRAVITY, URINE: 1.029 (ref 1.005–1.030)
pH: 5 (ref 5.0–8.0)

## 2015-07-02 LAB — CBC
HCT: 37.1 % — ABNORMAL LOW (ref 40.0–52.0)
Hemoglobin: 12.6 g/dL — ABNORMAL LOW (ref 13.0–18.0)
MCH: 28.9 pg (ref 26.0–34.0)
MCHC: 34.1 g/dL (ref 32.0–36.0)
MCV: 84.9 fL (ref 80.0–100.0)
PLATELETS: 192 10*3/uL (ref 150–440)
RBC: 4.37 MIL/uL — ABNORMAL LOW (ref 4.40–5.90)
RDW: 14.4 % (ref 11.5–14.5)
WBC: 9 10*3/uL (ref 3.8–10.6)

## 2015-07-02 LAB — BASIC METABOLIC PANEL
Anion gap: 12 (ref 5–15)
BUN: 29 mg/dL — ABNORMAL HIGH (ref 6–20)
CALCIUM: 9.6 mg/dL (ref 8.9–10.3)
CO2: 24 mmol/L (ref 22–32)
CREATININE: 1.25 mg/dL — AB (ref 0.61–1.24)
Chloride: 102 mmol/L (ref 101–111)
GFR calc non Af Amer: >60 mL/min (ref >60)
Glucose, Bld: 173 mg/dL — ABNORMAL HIGH (ref 65–99)
Potassium: 4.3 mmol/L (ref 3.5–5.1)
SODIUM: 138 mmol/L (ref 135–145)

## 2015-07-02 LAB — SURGICAL PCR SCREEN
MRSA, PCR: POSITIVE — AB
Staphylococcus aureus: POSITIVE — AB

## 2015-07-02 LAB — TYPE AND SCREEN
ABO/RH(D): A POS
Antibody Screen: NEGATIVE

## 2015-07-02 LAB — SEDIMENTATION RATE: Sed Rate: 45 mm/hr — ABNORMAL HIGH (ref 0–15)

## 2015-07-02 LAB — PROTIME-INR
INR: 1.04
PROTHROMBIN TIME: 13.8 s (ref 11.4–15.0)

## 2015-07-02 LAB — APTT: aPTT: 26 seconds (ref 24–36)

## 2015-07-02 NOTE — Patient Instructions (Signed)
Your procedure is scheduled on: Tuesday 07/08/15 Report to Day Surgery. 2ND FLOOR MEDICAL MALL ENTRANCE To find out your arrival time please call [phone] between 1PM - 3PM on Monday 07/06/17.  Remember: Instructions that are not followed completely may result in serious medical risk, up to and including death, or upon the discretion of your surgeon and anesthesiologist your surgery may need to be rescheduled.    __X__ 1. Do not eat food or drink liquids after midnight. No gum chewing or hard candies.     __X__ 2. No Alcohol for 24 hours before or after surgery.   ____ 3. Bring all medications with you on the day of surgery if instructed.    __X__ 4. Notify your doctor if there is any change in your medical condition     (cold, fever, infections).     Do not wear jewelry, make-up, hairpins, clips or nail polish.  Do not wear lotions, powders, or perfumes.   Do not shave 48 hours prior to surgery. Men may shave face and neck.  Do not bring valuables to the hospital.    [facility] belongings or valuables.               Contacts, dentures or bridgework may not be worn into surgery.  Leave your suitcase in the car. After surgery it may be brought to your room.  For patients admitted to the hospital, discharge time is determined by your                treatment team.   Patients discharged the day of surgery will not be allowed to drive home.   Please read over the following fact sheets that you were given:   MRSA Information and Surgical Site Infection Prevention   __X__ Take these medicines the morning of surgery with A SIP OF WATER:    1. BENAZEPRIL  2. CETIRIZINE  3. METOPROLOL  4.  5.  6.  ____ Fleet Enema (as directed)   __X__ Use CHG Soap as directed  ____ Use inhalers on the day of surgery  __X__ Stop metformin 2 days prior to surgery    ____ Take 1/2 of usual insulin dose the night before surgery and none on the morning of surgery.    __X__ Stop Coumadin/Plavix/aspirin on TODAY  __X__ Stop Anti-inflammatories on TODAY (ADVIL, IBUPROFEN, NAPROXEN)   __X__ Stop supplements until after surgery.  TURMERIC, FISH OIL, GARLIC  ____ Bring C-Pap to the hospital.

## 2015-07-03 NOTE — Pre-Procedure Instructions (Signed)
[facility], U[facility], [facility] MSG LEFT W/ CINDY.

## 2015-07-03 NOTE — Pre-Procedure Instructions (Signed)
ANESTHESIA - ED NOTES FOR EKG OF SAME DATE ED Provider Notes    Rockne MenghiniAnne-Caroline Norman, MD (Physician) 04/12/2015 13:28    Expand All Collapse All   [facility] Emergency Department Provider Note  ____________________________________________  Time seen: Approximately 9:33 AM  I have reviewed the triage vital signs and the nursing notes.   HISTORY  Chief Complaint Chest Pain    HPI Primus BravoCharles T Hosley is a 50 y.o. male with a history of HTN, HL, DM presenting with chest pain. Patient states that he was exerting himself at work when he developed a "sharp pressure" in the left side of the chest that was associated with "heavy breathing.". No radiation, palpitations, diaphoresis, nausea or vomiting. No lightheadedness or syncope. The episode lasted for 1-2 minutes and resolve spontaneously. Since then he has had several more episodes of the same duration and character. The patient is a vague historian and states that he has had intermittent chest pain for an unknown period of time at an unknown frequency that he describes as "light." He has not had any risk stratification studies including stress test or cardiac catheterizations in the past.  FH: Family history of early heart disease. No family history of clots.  SH: No tobacco, cocaine use. Positive daily EtOH use.   Past Medical History  Diagnosis Date  . Hypertension   . Kidney stones   . Plantar fascial fibromatosis   . Hyperlipidemia     Patient Active Problem List   Diagnosis Date Noted  . Osteoarthritis of both feet 03/05/2015  . Kidney stones 12/02/2014  . Hypertension 07/02/2014  . Hyperlipidemia 07/02/2014  . Diabetes mellitus without complication (HCC) 07/02/2014    Past Surgical History  Procedure Laterality Date  . Gallbladder surgery    . Cholecystectomy  2006  . Kidney stone surgery      Current Outpatient Rx  Name  Route   Sig  Dispense  Refill  . acetaminophen (TYLENOL) 500 MG tablet   Oral   Take 500 mg by mouth 2 (two) times daily as needed.         [name]. aspirin 81 MG tablet   Oral   Take 81 mg by mouth daily.         . benazepril (LOTENSIN) 40 MG tablet   Oral   Take 1 tablet (40 mg total) by mouth daily.   30 tablet   12   . calcium carbonate (TUMS EX) 750 MG chewable tablet   Oral   Chew 2 tablets by mouth daily as needed.         . cetirizine (ZYRTEC) 10 MG tablet   Oral   Take 10 mg by mouth daily as needed.         . hydrochlorothiazide (MICROZIDE) 12.5 MG capsule   Oral   Take 1 capsule (12.5 mg total) by mouth daily.   30 capsule   12   . ketoconazole (NIZORAL) 2 % cream      APPLY TO AFFECTED AREA OF GROIN TWICE A DAY AS NEEDED      0   . lovastatin (MEVACOR) 20 MG tablet   Oral   Take 2 tablets (40 mg total) by mouth at bedtime.   30 tablet   12   . metFORMIN (GLUCOPHAGE) 500 MG tablet   Oral   Take 1 tablet (500 mg total) by mouth 2 times daily at 12 noon and 4 pm.   60 tablet   12   .  metoprolol (LOPRESSOR) 100 MG tablet   Oral   Take 1 tablet (100 mg total) by mouth 2 (two) times daily.   60 tablet   12   . naproxen (NAPROSYN) 500 MG tablet   Oral   Take 1 tablet (500 mg total) by mouth 2 (two) times daily.   60 tablet   3   . naproxen (NAPROSYN) 500 MG tablet   Oral   Take 1 tablet (500 mg total) by mouth 2 (two) times daily with a meal.   60 tablet   3   . ONE TOUCH ULTRA TEST test strip      TEST once daily as directed   100 each   12   . ONETOUCH DELICA LANCETS FINE MISC      TEST once daily as directed   100 each   0   . tamsulosin (FLOMAX) 0.4 MG CAPS capsule   Oral   Take 1 capsule (0.4 mg total) by mouth daily.   30 capsule   12   . traMADol (ULTRAM) 50 MG tablet       take 1 tablet by mouth once daily if needed   90 tablet   0     Allergies Penicillins  Family History  Problem Relation Age of Onset  . Diabetes Maternal Uncle     Social History Social History  Substance Use Topics  . Smoking status: Never Smoker   . Smokeless tobacco: Never Used  . Alcohol Use: 0.0 oz/week    0 Standard drinks or equivalent per week     Comment: Occasionally drinks beer    Review of Systems Constitutional: No fever/chills. No lightheadedness or syncope. Eyes: No visual changes. ENT: No sore throat. Cardiovascular: Positive chest pain, without palpitations. Respiratory: Positive "heavy breathing," but no shortness of breath No cough. Gastrointestinal: No abdominal pain. No nausea, no vomiting. No diarrhea. No constipation. Genitourinary: Negative for dysuria. Musculoskeletal: Negative for back pain. No lower extremity swelling. Skin: Negative for rash. Neurological: Negative for headaches, focal weakness or numbness.  10-point ROS otherwise negative.  ____________________________________________   PHYSICAL EXAM:  VITAL SIGNS: ED Triage Vitals  Enc Vitals Group   BP --    Pulse --    Resp --    Temp --    Temp src --    SpO2 --    Weight 04/12/15 0913 248 lb (112.492 kg)   Height 04/12/15 0913 5\' 7"  (1.702 m)   Head Cir --    Peak Flow --    Pain Score 04/12/15 0914 10   Pain Loc --    Pain Edu? --    Excl. in GC? --     Constitutional: Alert and oriented. Well appearing and in no acute distress. Answer question appropriately. Eyes: Conjunctivae are normal. EOMI. Head: Atraumatic. Nose: No congestion/rhinnorhea. Mouth/Throat: Mucous membranes are moist.  Neck: No stridor. Supple. No JVD. Cardiovascular: Normal rate, regular rhythm.2/6 holosystolic murmur without rubs or gallops.  Respiratory: Normal respiratory  effort. No retractions. Lungs CTAB. No wheezes, rales or ronchi. Gastrointestinal: Obese. Soft and nontender. No distention. No peritoneal signs. Musculoskeletal: No LE edema. No palpable cords or tenderness to palpation in the calves. Negative Homans sign. Neurologic: Normal speech and language. No gross focal neurologic deficits are appreciated.  Skin: Skin is warm, dry and intact. No rash noted. Psychiatric: Mood and affect are normal. Speech and behavior are normal. Normal judgement.  ____________________________________________  LABS (all labs ordered are listed, but only abnormal results  are displayed)  Labs Reviewed  CBC - Abnormal; Notable for the following:    RBC 4.10 (*)    Hemoglobin 11.7 (*)    HCT 34.1 (*)    RDW 15.1 (*)    All other components within normal limits  BASIC METABOLIC PANEL - Abnormal; Notable for the following:    Sodium 133 (*)    Glucose, Bld 104 (*)    BUN 26 (*)    Calcium 8.6 (*)    All other components within normal limits  TROPONIN I  TROPONIN I   ____________________________________________  EKG  ED ECG REPORT I, Rockne Menghini, the attending physician, personally viewed and interpreted this ECG.  Date: 04/12/2015 EKG Time: 914 Rate: 63 Rhythm: normal sinus rhythm Axis: Leftward Intervals:Incomplete right bundle-branch block. ST&T Change: Nonspecific T-wave inversion in V1. No ST elevation.  ____________________________________________  RADIOLOGY  Dg Chest 2 View  04/12/2015 CLINICAL DATA: 50 year old male with a history of left-sided chest pain. EXAM: CHEST - 2 VIEW COMPARISON: No prior chest x-ray available. FINDINGS: Cardiomediastinal silhouette projects within normal limits in size and contour. No confluent airspace disease, pneumothorax, or pleural effusion. No displaced fracture. Unremarkable appearance of the upper abdomen. IMPRESSION: No radiographic  evidence of acute cardiopulmonary disease. Signed, Yvone Neu. Loreta Ave, DO [facility] Electronically Signed By: Gilmer Mor D.O. On: 04/12/2015 10:13    ____________________________________________   PROCEDURES  Procedure(s) performed: None  Critical Care performed: No ____________________________________________   INITIAL IMPRESSION / ASSESSMENT AND PLAN / ED COURSE  Pertinent labs & imaging results that were available during my care of the patient were reviewed by me and considered in my medical decision making (see chart for details).  50 y.o. male with no history of CAD but significant risk factors including HTN, HL, DM presenting with atypical chest pain. Time, the patient is chest pain-free. He had brief episodes of sharp self resolving chest pain and gives a vague history of some other chest pain that is unclear. It is possible that the patient has CAD and we will evaluate for acute MI. His EKG does not show any evidence of ischemia. Other etiologies include muscle strain given his exertional type of work, or esophageal spasm area consider GERD although he does not attribute any changes around eating. If the patient's workup is negative, we'll get a second troponin here today. He will benefit from a risk stratification study at this time and will do this as an outpatient as long as his emergency Department evaluation is reassuring and he remains chest pain free.  ----------------------------------------- 1:28 PM on 04/12/2015 -----------------------------------------  The patient's repeat troponin is also negative. At this time, the patient is symptom-free. His workup in the emergency department is reassuring and will have him follow-up with Dr. Romeo Apple shortly. Plan discharge. Return precautions as well as follow-up instructions were discussed. ____________________________________________  FINAL CLINICAL IMPRESSION(S) /  ED DIAGNOSES  Final diagnoses:  Chest pain, unspecified chest pain type      NEW MEDICATIONS STARTED DURING THIS VISIT:  New Prescriptions   No medications on file     Rockne Menghini, MD 04/12/15 1328       Electronically signed by Rockne Menghini, MD at 04/12/2015 1:28 PM

## 2015-07-04 LAB — URINE CULTURE: Culture: <10000 — AB

## 2015-07-07 LAB — HM DIABETES EYE EXAM

## 2015-07-08 ENCOUNTER — Inpatient Hospital Stay: Payer: BLUE CROSS/BLUE SHIELD | Admitting: Anesthesiology

## 2015-07-08 ENCOUNTER — Inpatient Hospital Stay
Admission: RE | Admit: 2015-07-08 | Discharge: 2015-07-11 | DRG: 470 | Disposition: A | Discharge status: Skilled Nursing Facility | Payer: BLUE CROSS/BLUE SHIELD | Source: Ambulatory Visit | Attending: Orthopedic Surgery | Admitting: Orthopedic Surgery

## 2015-07-08 ENCOUNTER — Encounter: Payer: Self-pay | Admitting: *Deleted

## 2015-07-08 ENCOUNTER — Inpatient Hospital Stay: Payer: BLUE CROSS/BLUE SHIELD

## 2015-07-08 ENCOUNTER — Encounter
Admission: RE | Disposition: A | Discharge status: Skilled Nursing Facility | Payer: Self-pay | Source: Ambulatory Visit | Attending: Orthopedic Surgery

## 2015-07-08 DIAGNOSIS — Z96649 Presence of unspecified artificial hip joint: Secondary | ICD-10-CM | POA: Diagnosis present

## 2015-07-08 DIAGNOSIS — I129 Hypertensive chronic kidney disease with stage 1 through stage 4 chronic kidney disease, or unspecified chronic kidney disease: Secondary | ICD-10-CM | POA: Diagnosis present

## 2015-07-08 DIAGNOSIS — E785 Hyperlipidemia, unspecified: Secondary | ICD-10-CM | POA: Diagnosis present

## 2015-07-08 DIAGNOSIS — I959 Hypotension, unspecified: Secondary | ICD-10-CM | POA: Diagnosis not present

## 2015-07-08 DIAGNOSIS — K219 Gastro-esophageal reflux disease without esophagitis: Secondary | ICD-10-CM | POA: Diagnosis present

## 2015-07-08 DIAGNOSIS — E871 Hypo-osmolality and hyponatremia: Secondary | ICD-10-CM | POA: Diagnosis present

## 2015-07-08 DIAGNOSIS — M25551 Pain in right hip: Secondary | ICD-10-CM

## 2015-07-08 DIAGNOSIS — E1122 Type 2 diabetes mellitus with diabetic chronic kidney disease: Secondary | ICD-10-CM | POA: Diagnosis present

## 2015-07-08 DIAGNOSIS — Z79899 Other long term (current) drug therapy: Secondary | ICD-10-CM

## 2015-07-08 DIAGNOSIS — N189 Chronic kidney disease, unspecified: Secondary | ICD-10-CM | POA: Diagnosis present

## 2015-07-08 DIAGNOSIS — M1611 Unilateral primary osteoarthritis, right hip: Secondary | ICD-10-CM | POA: Diagnosis present

## 2015-07-08 DIAGNOSIS — Z7984 Long term (current) use of oral hypoglycemic drugs: Secondary | ICD-10-CM | POA: Diagnosis not present

## 2015-07-08 DIAGNOSIS — Z419 Encounter for procedure for purposes other than remedying health state, unspecified: Secondary | ICD-10-CM

## 2015-07-08 DIAGNOSIS — Z880 Allergy status to penicillin: Secondary | ICD-10-CM | POA: Diagnosis not present

## 2015-07-08 DIAGNOSIS — D62 Acute posthemorrhagic anemia: Secondary | ICD-10-CM | POA: Diagnosis not present

## 2015-07-08 DIAGNOSIS — E876 Hypokalemia: Secondary | ICD-10-CM | POA: Diagnosis not present

## 2015-07-08 DIAGNOSIS — N179 Acute kidney failure, unspecified: Secondary | ICD-10-CM | POA: Diagnosis not present

## 2015-07-08 DIAGNOSIS — Z7982 Long term (current) use of aspirin: Secondary | ICD-10-CM | POA: Diagnosis not present

## 2015-07-08 DIAGNOSIS — G8918 Other acute postprocedural pain: Secondary | ICD-10-CM

## 2015-07-08 HISTORY — PX: TOTAL HIP ARTHROPLASTY: SHX124

## 2015-07-08 LAB — GLUCOSE, CAPILLARY
GLUCOSE-CAPILLARY: 192 mg/dL — AB (ref 65–99)
Glucose-Capillary: 117 mg/dL — ABNORMAL HIGH (ref 65–99)
Glucose-Capillary: 96 mg/dL (ref 65–99)

## 2015-07-08 LAB — CREATININE, SERUM
Creatinine, Ser: 0.98 mg/dL (ref 0.61–1.24)
GFR calc Af Amer: >60 mL/min (ref >60)
GFR calc non Af Amer: >60 mL/min (ref >60)

## 2015-07-08 LAB — CBC
HCT: 34.2 % — ABNORMAL LOW (ref 40.0–52.0)
Hemoglobin: 11.5 g/dL — ABNORMAL LOW (ref 13.0–18.0)
MCH: 28.6 pg (ref 26.0–34.0)
MCHC: 33.6 g/dL (ref 32.0–36.0)
MCV: 85.2 fL (ref 80.0–100.0)
PLATELETS: 170 10*3/uL (ref 150–440)
RBC: 4.01 MIL/uL — ABNORMAL LOW (ref 4.40–5.90)
RDW: 14.8 % — AB (ref 11.5–14.5)
WBC: 13.4 10*3/uL — ABNORMAL HIGH (ref 3.8–10.6)

## 2015-07-08 SURGERY — ARTHROPLASTY, HIP, TOTAL, ANTERIOR APPROACH
Anesthesia: Spinal | Site: Hip | Laterality: Right | Wound class: Clean

## 2015-07-08 MED ORDER — METOPROLOL TARTRATE 50 MG PO TABS
100.0000 mg | ORAL_TABLET | Freq: Two times a day (BID) | ORAL | Status: DC
  Administered 2015-07-08 – 2015-07-10 (×5): 100 mg via ORAL
  Filled 2015-07-08: qty 1
  Filled 2015-07-08 (×5): qty 2

## 2015-07-08 MED ORDER — CLINDAMYCIN PHOSPHATE 900 MG/50ML IV SOLN
INTRAVENOUS | Status: AC
  Administered 2015-07-08: 900 mg via INTRAVENOUS
  Filled 2015-07-08: qty 50

## 2015-07-08 MED ORDER — FENTANYL CITRATE (PF) 100 MCG/2ML IJ SOLN: 25.0000 ug | INTRAMUSCULAR | Status: DC | PRN

## 2015-07-08 MED ORDER — MAGNESIUM HYDROXIDE 400 MG/5ML PO SUSP: 30.0000 mL | Freq: Every day | ORAL | Status: DC | PRN

## 2015-07-08 MED ORDER — ATORVASTATIN CALCIUM 20 MG PO TABS
80.0000 mg | ORAL_TABLET | Freq: Every day | ORAL | Status: DC
  Administered 2015-07-08 – 2015-07-10 (×3): 80 mg via ORAL
  Filled 2015-07-08 (×3): qty 4

## 2015-07-08 MED ORDER — ACETAMINOPHEN 325 MG PO TABS
650.0000 mg | ORAL_TABLET | Freq: Four times a day (QID) | ORAL | Status: DC | PRN
  Administered 2015-07-10: 650 mg via ORAL
  Filled 2015-07-08: qty 2

## 2015-07-08 MED ORDER — VANCOMYCIN HCL IN DEXTROSE 1-5 GM/200ML-% IV SOLN
1000.0000 mg | Freq: Once | INTRAVENOUS | Status: AC
  Administered 2015-07-08: 1000 mg via INTRAVENOUS

## 2015-07-08 MED ORDER — LORATADINE 10 MG PO TABS
10.0000 mg | ORAL_TABLET | Freq: Every day | ORAL | Status: DC
  Administered 2015-07-08 – 2015-07-10 (×3): 10 mg via ORAL
  Filled 2015-07-08 (×4): qty 1

## 2015-07-08 MED ORDER — ONDANSETRON HCL 4 MG PO TABS: 4.0000 mg | ORAL_TABLET | Freq: Four times a day (QID) | ORAL | Status: DC | PRN

## 2015-07-08 MED ORDER — CLINDAMYCIN PHOSPHATE 900 MG/50ML IV SOLN: 900.0000 mg | Freq: Once | INTRAVENOUS | Status: DC

## 2015-07-08 MED ORDER — NEOMYCIN-POLYMYXIN B GU 40-200000 IR SOLN
Status: AC
  Filled 2015-07-08: qty 4

## 2015-07-08 MED ORDER — BENAZEPRIL HCL 20 MG PO TABS
40.0000 mg | ORAL_TABLET | Freq: Every day | ORAL | Status: DC
  Administered 2015-07-08 – 2015-07-10 (×3): 40 mg via ORAL
  Filled 2015-07-08 (×3): qty 2

## 2015-07-08 MED ORDER — ONDANSETRON HCL 4 MG/2ML IJ SOLN: 4.0000 mg | Freq: Four times a day (QID) | INTRAMUSCULAR | Status: DC | PRN

## 2015-07-08 MED ORDER — DOCUSATE SODIUM 100 MG PO CAPS
100.0000 mg | ORAL_CAPSULE | Freq: Two times a day (BID) | ORAL | Status: DC
  Administered 2015-07-08 – 2015-07-11 (×6): 100 mg via ORAL
  Filled 2015-07-08 (×6): qty 1

## 2015-07-08 MED ORDER — METFORMIN HCL 500 MG PO TABS
500.0000 mg | ORAL_TABLET | Freq: Two times a day (BID) | ORAL | Status: DC
  Administered 2015-07-08 – 2015-07-11 (×6): 500 mg via ORAL
  Filled 2015-07-08 (×6): qty 1

## 2015-07-08 MED ORDER — MORPHINE SULFATE (PF) 2 MG/ML IV SOLN
2.0000 mg | INTRAVENOUS | Status: DC | PRN
  Administered 2015-07-08 – 2015-07-09 (×4): 2 mg via INTRAVENOUS
  Filled 2015-07-08 (×4): qty 1

## 2015-07-08 MED ORDER — DEXTROMETHORPHAN POLISTIREX ER 30 MG/5ML PO SUER
30.0000 mg | Freq: Two times a day (BID) | ORAL | Status: DC | PRN
  Filled 2015-07-08 (×2): qty 5

## 2015-07-08 MED ORDER — BUPIVACAINE-EPINEPHRINE (PF) 0.25% -1:200000 IJ SOLN
INTRAMUSCULAR | Status: AC
  Filled 2015-07-08: qty 30

## 2015-07-08 MED ORDER — NEOMYCIN-POLYMYXIN B GU 40-200000 IR SOLN
Status: DC | PRN
  Administered 2015-07-08: 4 mL

## 2015-07-08 MED ORDER — VANCOMYCIN HCL IN DEXTROSE 1-5 GM/200ML-% IV SOLN
INTRAVENOUS | Status: AC
  Administered 2015-07-08: 1000 mg via INTRAVENOUS
  Filled 2015-07-08: qty 200

## 2015-07-08 MED ORDER — SODIUM CHLORIDE 0.9 % IV SOLN
INTRAVENOUS | Status: DC
  Administered 2015-07-08 (×3): via INTRAVENOUS

## 2015-07-08 MED ORDER — KETAMINE HCL 50 MG/ML IJ SOLN
INTRAMUSCULAR | Status: DC | PRN
  Administered 2015-07-08: 50 mg via INTRAMUSCULAR

## 2015-07-08 MED ORDER — ENOXAPARIN SODIUM 40 MG/0.4ML ~~LOC~~ SOLN
40.0000 mg | SUBCUTANEOUS | Status: DC
  Administered 2015-07-09 – 2015-07-11 (×3): 40 mg via SUBCUTANEOUS
  Filled 2015-07-08 (×3): qty 0.4

## 2015-07-08 MED ORDER — HYDROCHLOROTHIAZIDE 12.5 MG PO CAPS
12.5000 mg | ORAL_CAPSULE | Freq: Every day | ORAL | Status: DC
  Administered 2015-07-08 – 2015-07-11 (×4): 12.5 mg via ORAL
  Filled 2015-07-08 (×4): qty 1

## 2015-07-08 MED ORDER — PROPOFOL 500 MG/50ML IV EMUL
INTRAVENOUS | Status: DC | PRN
  Administered 2015-07-08: 85 ug/kg/min via INTRAVENOUS

## 2015-07-08 MED ORDER — TRANEXAMIC ACID 1000 MG/10ML IV SOLN
1500.0000 mg | INTRAVENOUS | Status: AC
  Administered 2015-07-08: 1500 mg via INTRAVENOUS
  Filled 2015-07-08: qty 15

## 2015-07-08 MED ORDER — SODIUM CHLORIDE 0.9 % IV SOLN
10000.0000 ug | INTRAVENOUS | Status: DC | PRN
  Administered 2015-07-08: 40 ug/min via INTRAVENOUS

## 2015-07-08 MED ORDER — METOCLOPRAMIDE HCL 10 MG PO TABS: 5.0000 mg | ORAL_TABLET | Freq: Three times a day (TID) | ORAL | Status: DC | PRN

## 2015-07-08 MED ORDER — DIPHENHYDRAMINE HCL 12.5 MG/5ML PO ELIX: 12.5000 mg | ORAL_SOLUTION | ORAL | Status: DC | PRN

## 2015-07-08 MED ORDER — BUPIVACAINE HCL (PF) 0.5 % IJ SOLN
INTRAMUSCULAR | Status: DC | PRN
  Administered 2015-07-08: 3 mL

## 2015-07-08 MED ORDER — CALCIUM CARBONATE ANTACID 500 MG PO CHEW: 3.0000 | CHEWABLE_TABLET | Freq: Every day | ORAL | Status: DC | PRN

## 2015-07-08 MED ORDER — TAMSULOSIN HCL 0.4 MG PO CAPS
0.4000 mg | ORAL_CAPSULE | Freq: Every day | ORAL | Status: DC
  Administered 2015-07-08 – 2015-07-11 (×4): 0.4 mg via ORAL
  Filled 2015-07-08 (×4): qty 1

## 2015-07-08 MED ORDER — OXYCODONE HCL 5 MG PO TABS
5.0000 mg | ORAL_TABLET | ORAL | Status: DC | PRN
  Administered 2015-07-08: 5 mg via ORAL
  Administered 2015-07-08 – 2015-07-09 (×4): 10 mg via ORAL
  Administered 2015-07-09: 5 mg via ORAL
  Administered 2015-07-09 (×2): 10 mg via ORAL
  Administered 2015-07-10 (×2): 5 mg via ORAL
  Administered 2015-07-10: 10 mg via ORAL
  Administered 2015-07-11: 5 mg via ORAL
  Administered 2015-07-11: 10 mg via ORAL
  Filled 2015-07-08 (×2): qty 1
  Filled 2015-07-08 (×3): qty 2
  Filled 2015-07-08: qty 1
  Filled 2015-07-08 (×4): qty 2
  Filled 2015-07-08 (×2): qty 1
  Filled 2015-07-08: qty 2

## 2015-07-08 MED ORDER — BUPIVACAINE-EPINEPHRINE 0.25% -1:200000 IJ SOLN
INTRAMUSCULAR | Status: DC | PRN
  Administered 2015-07-08: 30 mL

## 2015-07-08 MED ORDER — PHENYLEPHRINE HCL 10 MG/ML IJ SOLN
INTRAMUSCULAR | Status: DC | PRN
  Administered 2015-07-08: 50 ug via INTRAVENOUS
  Administered 2015-07-08: 100 ug via INTRAVENOUS

## 2015-07-08 MED ORDER — MAGNESIUM CITRATE PO SOLN: 1.0000 | Freq: Once | ORAL | Status: DC | PRN

## 2015-07-08 MED ORDER — BISACODYL 5 MG PO TBEC: 5.0000 mg | DELAYED_RELEASE_TABLET | Freq: Every day | ORAL | Status: DC | PRN

## 2015-07-08 MED ORDER — VANCOMYCIN HCL IN DEXTROSE 1-5 GM/200ML-% IV SOLN
1000.0000 mg | Freq: Two times a day (BID) | INTRAVENOUS | Status: AC
  Administered 2015-07-08 – 2015-07-09 (×2): 1000 mg via INTRAVENOUS
  Filled 2015-07-08 (×2): qty 200

## 2015-07-08 MED ORDER — MIDAZOLAM HCL 5 MG/5ML IJ SOLN
INTRAMUSCULAR | Status: DC | PRN
  Administered 2015-07-08: 2 mg via INTRAVENOUS

## 2015-07-08 MED ORDER — METHOCARBAMOL 500 MG PO TABS
500.0000 mg | ORAL_TABLET | Freq: Four times a day (QID) | ORAL | Status: DC | PRN
  Administered 2015-07-08 – 2015-07-10 (×3): 500 mg via ORAL
  Filled 2015-07-08 (×3): qty 1

## 2015-07-08 MED ORDER — MENTHOL 3 MG MT LOZG: 1.0000 | LOZENGE | OROMUCOSAL | Status: DC | PRN

## 2015-07-08 MED ORDER — FAMOTIDINE 20 MG PO TABS
20.0000 mg | ORAL_TABLET | Freq: Once | ORAL | Status: AC
  Administered 2015-07-08: 20 mg via ORAL

## 2015-07-08 MED ORDER — FAMOTIDINE 20 MG PO TABS
ORAL_TABLET | ORAL | Status: AC
  Administered 2015-07-08: 20 mg via ORAL
  Filled 2015-07-08: qty 1

## 2015-07-08 MED ORDER — ASPIRIN EC 81 MG PO TBEC
81.0000 mg | DELAYED_RELEASE_TABLET | Freq: Every day | ORAL | Status: DC
  Administered 2015-07-08 – 2015-07-11 (×4): 81 mg via ORAL
  Filled 2015-07-08 (×4): qty 1

## 2015-07-08 MED ORDER — METHOCARBAMOL 1000 MG/10ML IJ SOLN: 500.0000 mg | Freq: Four times a day (QID) | INTRAMUSCULAR | Status: DC | PRN

## 2015-07-08 MED ORDER — METOCLOPRAMIDE HCL 5 MG/ML IJ SOLN
5.0000 mg | Freq: Three times a day (TID) | INTRAMUSCULAR | Status: DC | PRN
Start: 2015-07-08 — End: 2015-07-11

## 2015-07-08 MED ORDER — PHENOL 1.4 % MT LIQD: 1.0000 | OROMUCOSAL | Status: DC | PRN

## 2015-07-08 MED ORDER — ONDANSETRON HCL 4 MG/2ML IJ SOLN: 4.0000 mg | Freq: Once | INTRAMUSCULAR | Status: DC | PRN

## 2015-07-08 MED ORDER — KETOCONAZOLE 2 % EX CREA
1.0000 "application " | TOPICAL_CREAM | Freq: Every day | CUTANEOUS | Status: DC
  Administered 2015-07-09 – 2015-07-11 (×3): 1 via TOPICAL
  Filled 2015-07-08: qty 15

## 2015-07-08 MED ORDER — ZOLPIDEM TARTRATE 5 MG PO TABS: 5.0000 mg | ORAL_TABLET | Freq: Every evening | ORAL | Status: DC | PRN

## 2015-07-08 MED ORDER — DM-GUAIFENESIN ER 30-600 MG PO TB12: 1.0000 | ORAL_TABLET | Freq: Two times a day (BID) | ORAL | Status: DC | PRN

## 2015-07-08 MED ORDER — SODIUM CHLORIDE 0.9 % IV SOLN
INTRAVENOUS | Status: DC
  Administered 2015-07-08: 21:00:00 via INTRAVENOUS

## 2015-07-08 MED ORDER — GUAIFENESIN ER 600 MG PO TB12: 600.0000 mg | ORAL_TABLET | Freq: Two times a day (BID) | ORAL | Status: DC | PRN

## 2015-07-08 MED ORDER — BUPIVACAINE HCL (PF) 0.25 % IJ SOLN
INTRAMUSCULAR | Status: AC
  Filled 2015-07-08: qty 30

## 2015-07-08 MED ORDER — ACETAMINOPHEN 650 MG RE SUPP: 650.0000 mg | Freq: Four times a day (QID) | RECTAL | Status: DC | PRN

## 2015-07-08 SURGICAL SUPPLY — 44 items
BLADE SAW SAG 18.5X105 (BLADE) ×2 IMPLANT
BNDG COHESIVE 6X5 TAN STRL LF (GAUZE/BANDAGES/DRESSINGS) ×4 IMPLANT
CANISTER SUCT 1200ML W/VALVE (MISCELLANEOUS) ×2 IMPLANT
CAPT HIP TOTAL 3 ×2 IMPLANT
CATH FOL LEG HOLDER (MISCELLANEOUS) ×2 IMPLANT
CATH TRAY METER 16FR LF (MISCELLANEOUS) ×2 IMPLANT
CHLORAPREP W/TINT 26ML (MISCELLANEOUS) ×2 IMPLANT
DRAPE C-ARM XRAY 36X54 (DRAPES) ×2 IMPLANT
DRAPE INCISE IOBAN 66X60 STRL (DRAPES) IMPLANT
DRAPE POUCH INSTRU U-SHP 10X18 (DRAPES) ×2 IMPLANT
DRAPE SHEET LG 3/4 BI-LAMINATE (DRAPES) ×6 IMPLANT
DRAPE STERI IOBAN 125X83 (DRAPES) ×2 IMPLANT
DRAPE TABLE BACK 80X90 (DRAPES) ×2 IMPLANT
DRSG OPSITE POSTOP 4X8 (GAUZE/BANDAGES/DRESSINGS) ×4 IMPLANT
ELECT BLADE 6.5 EXT (BLADE) ×2 IMPLANT
GAUZE SPONGE 4X4 12PLY STRL (GAUZE/BANDAGES/DRESSINGS) ×2 IMPLANT
GLOVE BIOGEL PI IND STRL 9 (GLOVE) ×1 IMPLANT
GLOVE BIOGEL PI INDICATOR 9 (GLOVE) ×1
GLOVE SURG ORTHO 9.0 STRL STRW (GLOVE) ×4 IMPLANT
GOWN STRL REUS W/ TWL LRG LVL3 (GOWN DISPOSABLE) ×1 IMPLANT
GOWN STRL REUS W/TWL LRG LVL3 (GOWN DISPOSABLE) ×1
GOWN SURG XXL (GOWNS) ×2 IMPLANT
HEMOVAC 400CC 10FR (MISCELLANEOUS) ×2 IMPLANT
HOOD PEEL AWAY FLYTE STAYCOOL (MISCELLANEOUS) ×2 IMPLANT
MAT BLUE FLOOR 46X72 FLO (MISCELLANEOUS) ×2 IMPLANT
NDL SAFETY 18GX1.5 (NEEDLE) ×2 IMPLANT
NEEDLE SPNL 18GX3.5 QUINCKE PK (NEEDLE) ×2 IMPLANT
NS IRRIG 1000ML POUR BTL (IV SOLUTION) ×2 IMPLANT
PACK HIP COMPR (MISCELLANEOUS) ×2 IMPLANT
SOL PREP PVP 2OZ (MISCELLANEOUS) ×2
SOLUTION PREP PVP 2OZ (MISCELLANEOUS) ×1 IMPLANT
STAPLER SKIN PROX 35W (STAPLE) ×2 IMPLANT
STRAP SAFETY BODY (MISCELLANEOUS) ×2 IMPLANT
SUT DVC 2 QUILL PDO  T11 36X36 (SUTURE) ×1
SUT DVC 2 QUILL PDO T11 36X36 (SUTURE) ×1 IMPLANT
SUT DVC QUILL MONODERM 30X30 (SUTURE) ×2 IMPLANT
SUT SILK 0 (SUTURE) ×1
SUT SILK 0 30XBRD TIE 6 (SUTURE) ×1 IMPLANT
SUT VIC AB 1 CT1 36 (SUTURE) ×2 IMPLANT
SYR 20CC LL (SYRINGE) ×2 IMPLANT
SYR 30ML LL (SYRINGE) ×2 IMPLANT
TAPE MICROFOAM 4IN (TAPE) ×2 IMPLANT
TOWEL OR 17X26 4PK STRL BLUE (TOWEL DISPOSABLE) ×2 IMPLANT
TUBE KAMVAC SUCTION (TUBING) ×2 IMPLANT

## 2015-07-08 NOTE — [place] FL2 (Signed)
[place] MEDICAID FL2 LEVEL OF CARE SCREENING TOOL     IDENTIFICATION  Patient Name: Joshua Lee Birthdate: 07/22/1965 Sex: male Admission Date (Current Location): 07/08/2015  [place]   Facility and Address:  [facility], [address], [place], [place] [address]      Provider Number: [id number]  Attending Physician Name and Address:  Kennedy BuckerMichael Menz, MD  Relative Name and Phone Number:       Current Level of Care: Hospital Recommended Level of Care: Skilled Nursing Facility Prior Approval Number:    Date Approved/Denied:   PASRR Number:  [id number])  Discharge Plan: SNF    Current Diagnoses: Patient Active Problem List   Diagnosis Date Noted  . Primary osteoarthritis of right hip 07/08/2015  . Plantar fasciitis 04/17/2015  . Osteoarthritis of both feet 03/05/2015  . Kidney stones 12/02/2014  . Hypertension 07/02/2014  . Hyperlipidemia 07/02/2014  . Diabetes mellitus without complication (HCC) 07/02/2014    Orientation RESPIRATION BLADDER Height & Weight     Self, Time, Situation, Place  Normal Continent Weight: 253 lb (114.76 kg) Height:  5\' 8"  (172.7 cm)  BEHAVIORAL SYMPTOMS/MOOD NEUROLOGICAL BOWEL NUTRITION STATUS   (none )  (none ) Continent Diet (Regular Diet )  AMBULATORY STATUS COMMUNICATION OF NEEDS Skin   Extensive Assist Verbally Surgical wounds, Wound Vac (Incision: Right Hip. Provena Wound Vac on incision. )                       Personal Care Assistance Level of Assistance  Bathing, Feeding, Dressing Bathing Assistance: Limited assistance Feeding assistance: Independent Dressing Assistance: Limited assistance     Functional Limitations Info  Sight, Hearing, Speech Sight Info: Adequate Hearing Info: Adequate Speech Info: Adequate    SPECIAL CARE FACTORS FREQUENCY  PT (By licensed PT), OT (By licensed OT)     PT Frequency:  (5) OT Frequency:  (5)             Contractures      Additional Factors Info  Code Status, Allergies, Isolation Precautions Code Status Info:  (Full Code. ) Allergies Info:  (Penicillins )     Isolation Precautions Info:  (MRSA nasal swab)     Current Medications (07/08/2015):  This is the current hospital active medication list Current Facility-Administered Medications  Medication Dose Route Frequency Provider Last Rate Last Dose  . 0.9 %  sodium chloride infusion   Intravenous Continuous Kennedy BuckerMichael Menz, MD      . acetaminophen (TYLENOL) tablet 650 mg  650 mg Oral Q6H PRN Kennedy BuckerMichael Menz, MD       Or  . acetaminophen (TYLENOL) suppository 650 mg  650 mg Rectal Q6H PRN Kennedy BuckerMichael Menz, MD      . aspirin EC tablet 81 mg  81 mg Oral Daily Kennedy BuckerMichael Menz, MD      . atorvastatin (LIPITOR) tablet 80 mg  80 mg Oral QHS Kennedy BuckerMichael Menz, MD      . benazepril (LOTENSIN) tablet 40 mg  40 mg Oral Daily Kennedy BuckerMichael Menz, MD      . bisacodyl (DULCOLAX) EC tablet 5 mg  5 mg Oral Daily PRN Kennedy BuckerMichael Menz, MD      . calcium carbonate (TUMS - dosed in mg elemental calcium) chewable tablet 600 mg of elemental calcium  3 tablet Oral Daily PRN Kennedy BuckerMichael Menz, MD      . dextromethorphan (DELSYM) 30 MG/5ML liquid 30 mg  30 mg Oral BID PRN Casimiro NeedleMichael  Rosita Kea, MD       And  . guaiFENesin (MUCINEX) 12 hr tablet 600 mg  600 mg Oral BID PRN Kennedy Bucker, MD      . diphenhydrAMINE (BENADRYL) 12.5 MG/5ML elixir 12.5-25 mg  12.5-25 mg Oral Q4H PRN Kennedy Bucker, MD      . docusate sodium (COLACE) capsule 100 mg  100 mg Oral BID Kennedy Bucker, MD      . Melene Muller ON 07/09/2015] enoxaparin (LOVENOX) injection 40 mg  40 mg Subcutaneous Q24H Kennedy Bucker, MD      . hydrochlorothiazide (MICROZIDE) capsule 12.5 mg  12.5 mg Oral Daily Kennedy Bucker, MD      . ketoconazole (NIZORAL) 2 % cream 1 application  1 application Topical Daily Kennedy Bucker, MD      . loratadine (CLARITIN) tablet 10 mg  10 mg Oral Daily Kennedy Bucker, MD      . magnesium citrate solution 1 Bottle  1 Bottle Oral  Once PRN Kennedy Bucker, MD      . magnesium hydroxide (MILK OF MAGNESIA) suspension 30 mL  30 mL Oral Daily PRN Kennedy Bucker, MD      . menthol-cetylpyridinium (CEPACOL) lozenge 3 mg  1 lozenge Oral PRN Kennedy Bucker, MD       Or  . phenol (CHLORASEPTIC) mouth spray 1 spray  1 spray Mouth/Throat PRN Kennedy Bucker, MD      . metFORMIN (GLUCOPHAGE) tablet 500 mg  500 mg Oral BID WC Kennedy Bucker, MD      . methocarbamol (ROBAXIN) tablet 500 mg  500 mg Oral Q6H PRN Kennedy Bucker, MD       Or  . methocarbamol (ROBAXIN) 500 mg in dextrose 5 % 50 mL IVPB  500 mg Intravenous Q6H PRN Kennedy Bucker, MD      . metoCLOPramide (REGLAN) tablet 5-10 mg  5-10 mg Oral Q8H PRN Kennedy Bucker, MD       Or  . metoCLOPramide (REGLAN) injection 5-10 mg  5-10 mg Intravenous Q8H PRN Kennedy Bucker, MD      . metoprolol (LOPRESSOR) tablet 100 mg  100 mg Oral BID Kennedy Bucker, MD      . morphine 2 MG/ML injection 2 mg  2 mg Intravenous Q1H PRN Kennedy Bucker, MD      . ondansetron [facility]) tablet 4 mg  4 mg Oral Q6H PRN Kennedy Bucker, MD       Or  . ondansetron [facility]) injection 4 mg  4 mg Intravenous Q6H PRN Kennedy Bucker, MD      . oxyCODONE (Oxy IR/ROXICODONE) immediate release tablet 5-10 mg  5-10 mg Oral Q3H PRN Kennedy Bucker, MD      . tamsulosin [facility]) capsule 0.4 mg  0.4 mg Oral Daily Kennedy Bucker, MD      . vancomycin (VANCOCIN) IVPB 1000 mg/200 mL premix  1,000 mg Intravenous Q12H Kennedy Bucker, MD      . zolpidem [facility]) tablet 5 mg  5 mg Oral QHS PRN,MR X 1 Kennedy Bucker, MD         Discharge Medications: Please see discharge summary for a list of discharge medications.  Relevant Imaging Results:  Relevant Lab Results:   Additional Information  (SSN: [id number])  Haig Prophet, LCSW

## 2015-07-08 NOTE — Transfer of Care (Signed)
Immediate Anesthesia Transfer of Care Note  Patient: Joshua Lee  Procedure(s) Performed: Procedure(s): TOTAL HIP ARTHROPLASTY ANTERIOR APPROACH (Right)  Patient Location: PACU  Anesthesia Type:General  Level of Consciousness: sedated  Airway & Oxygen Therapy: Patient connected to face mask oxygen  Post-op Assessment: Report given to RN and Post -op Vital signs reviewed and stable  Post vital signs: Reviewed and stable  Last Vitals:  Filed Vitals:   07/08/15 0841 07/08/15 0846  BP: 122/64   Pulse: 77   Temp:  36.3 C  Resp: 16     Last Pain:  Filed Vitals:   07/08/15 0911  PainSc: 5          Complications: No apparent anesthesia complications

## 2015-07-08 NOTE — Anesthesia Procedure Notes (Addendum)
Spinal Patient location during procedure: OR Start time: 07/08/2015 10:25 AM End time: 07/08/2015 10:29 AM Staffing Resident/CRNA: Nelda Marseille Performed by: resident/CRNA  Preanesthetic Checklist Completed: patient identified, site marked, surgical consent, pre-op evaluation, timeout performed, IV checked, risks and benefits discussed and monitors and equipment checked Spinal Block Patient position: sitting Prep: Betadine Patient monitoring: heart rate, continuous pulse ox, blood pressure and cardiac monitor Approach: midline Location: L3-4 Injection technique: single-shot Needle Needle type: Whitacre and Introducer  Needle gauge: 25 G Needle length: 9 cm Additional Notes Negative paresthesia. Negative blood return. Positive free-flowing CSF. Expiration date of kit checked and confirmed. Patient tolerated procedure well, without complications.    Date/Time: 07/08/2015 11:40 AM Performed by: Nelda Marseille Pre-anesthesia Checklist: Patient identified, Emergency Drugs available, Suction available, Patient being monitored and Timeout performed Oxygen Delivery Method: Simple face mask

## 2015-07-08 NOTE — Progress Notes (Signed)
Pt arrived from PACU with low temp. Pt placed on bear hugger . Temp increased to 96.4  Pt refusing bear hugger any longer. States he is "burning up". Cont to monitor temps.

## 2015-07-08 NOTE — H&P (Signed)
Reviewed paper H+P, will be scanned into chart. No changes noted.  

## 2015-07-08 NOTE — Op Note (Signed)
07/08/2015  1:05 PM  PATIENT:  Joshua Lee  50 y.o. male  PRE-OPERATIVE DIAGNOSIS:  RIGHT OSTEOARTHRITIS HIP  POST-OPERATIVE DIAGNOSIS:  RIGHT OSTEOARTHRITIS HIP  PROCEDURE:  Procedure(s): TOTAL HIP ARTHROPLASTY ANTERIOR APPROACH (Right)  SURGEON: Leitha SchullerMichael J Adin Lariccia, MD  ASSISTANTS: None  ANESTHESIA:   spinal  EBL:  Total I/O In: 1400 [I.V.:1400] Out: 700 [Urine:200; Blood:500]  BLOOD ADMINISTERED:none  DRAINS: (2) Hemovact drain(s) in the Subcutaneous layer with  Suction Open   LOCAL MEDICATIONS USED:  MARCAINE     SPECIMEN:  Source of Specimen:  Right femoral head  DISPOSITION OF SPECIMEN:  PATHOLOGY  COUNTS:  YES  TOURNIQUET:  * No tourniquets in log *  IMPLANTS: Medacta AMIS 3 stem collared, 56 mpact cup DM with liner and S 28 mm head  DICTATION: .Dragon Dictation   The patient was brought to the operating room and after spinal anesthesia was obtained patient was placed on the operative table with the ipsilateral foot into the Medacta attachment, contralateral leg on a well-padded table. C-arm was brought in and preop template x-ray taken. After prepping and draping in usual sterile fashion appropriate patient identification and timeout procedures were completed. Anterior approach to the hip was obtained and centered over the greater trochanter and TFL muscle. The subcutaneous tissue was incised hemostasis being achieved by electrocautery. TFL fascia was incised and the muscle retracted laterally deep retractor placed. The lateral femoral circumflex vessels were identified and ligated. The anterior capsule was exposed and a capsulotomy performed. The neck was identified and a femoral neck cut carried out with a saw. The head was removed without difficulty and showed sclerotic femoral head and acetabulum. Reaming was carried out to 54 mm and a 56 mm cup trial gave appropriate tightness to the acetabular component a 56 DM cup was impacted into position. The leg was then  externally rotated and ischiofemoral and pubofemoral releases carried out. The femur was sequentially broached to a size 3, size 3 standard stem and S head trials were placed and the final components chosen. The 3 standard stem was inserted along with a S 28 mm head and 56 mm liner. The hip was reduced and was stable the wound was thoroughly irrigated n. The deep fascia was closed using a heavy Quill after infiltration of 30 cc of quarter percent Sensorcaine with epinephrine. Subcutaneous drains were then inserted 2-0 Quill to close the skin with skin staples Provena wound vac applied PLAN OF CARE: Admit to inpatient

## 2015-07-08 NOTE — Anesthesia Preprocedure Evaluation (Signed)
Anesthesia Evaluation  Patient identified by MRN, date of birth, ID band Patient awake    Reviewed: Allergy & Precautions, H&P , NPO status , Patient's Chart, lab work & pertinent test results, reviewed documented beta blocker date and time   Airway Mallampati: IV  TM Distance: <3 FB Neck ROM: full    Dental  (+) Poor Dentition, Teeth Intact   Pulmonary neg pulmonary ROS,    Pulmonary exam normal        Cardiovascular Exercise Tolerance: Poor hypertension, On Medications (-) CAD negative cardio ROS Normal cardiovascular exam Rhythm:regular Rate:Normal     Neuro/Psych negative neurological ROS  negative psych ROS   GI/Hepatic negative GI ROS, Neg liver ROS, GERD  Medicated,  Endo/Other  negative endocrine ROSdiabetes  Renal/GU Renal diseasenegative Renal ROS  negative genitourinary   Musculoskeletal   Abdominal   Peds  Hematology negative hematology ROS (+)   Anesthesia Other Findings Past Medical History:   Hypertension                                                 Kidney stones                                                Plantar fascial fibromatosis                                 Hyperlipidemia                                               Diabetes mellitus without complication (HCC)                 GERD (gastroesophageal reflux disease)                     Past Surgical History:   GALLBLADDER SURGERY                                           CHOLECYSTECTOMY                                  2006         KIDNEY STONE SURGERY                                          KNEE ARTHROSCOPY                                            BMI    Body Mass Index   38.47 kg/m 2     Reproductive/Obstetrics  Anesthesia Physical Anesthesia Plan  ASA: III  Anesthesia Plan: Spinal   Post-op Pain Management:    Induction:   Airway Management Planned:    Additional Equipment:   Intra-op Plan:   Post-operative Plan:   Informed Consent: I have reviewed the patients History and Physical, chart, labs and discussed the procedure including the risks, benefits and alternatives for the proposed anesthesia with the patient or authorized representative who has indicated his/her understanding and acceptance.   Dental Advisory Given  Plan Discussed with: CRNA  Anesthesia Plan Comments:         Anesthesia Quick Evaluation

## 2015-07-09 LAB — BASIC METABOLIC PANEL
Anion gap: 10 (ref 5–15)
BUN: 18 mg/dL (ref 6–20)
CALCIUM: 8.6 mg/dL — AB (ref 8.9–10.3)
CO2: 24 mmol/L (ref 22–32)
Chloride: 100 mmol/L — ABNORMAL LOW (ref 101–111)
Creatinine, Ser: 0.89 mg/dL (ref 0.61–1.24)
GFR calc Af Amer: >60 mL/min (ref >60)
GLUCOSE: 157 mg/dL — AB (ref 65–99)
POTASSIUM: 3.4 mmol/L — AB (ref 3.5–5.1)
Sodium: 134 mmol/L — ABNORMAL LOW (ref 135–145)

## 2015-07-09 LAB — CBC
HCT: 30 % — ABNORMAL LOW (ref 40.0–52.0)
Hemoglobin: 10.6 g/dL — ABNORMAL LOW (ref 13.0–18.0)
MCH: 29.7 pg (ref 26.0–34.0)
MCHC: 35.3 g/dL (ref 32.0–36.0)
MCV: 84.2 fL (ref 80.0–100.0)
PLATELETS: 162 10*3/uL (ref 150–440)
RBC: 3.56 MIL/uL — ABNORMAL LOW (ref 4.40–5.90)
RDW: 14.5 % (ref 11.5–14.5)
WBC: 10.8 10*3/uL — ABNORMAL HIGH (ref 3.8–10.6)

## 2015-07-09 MED ORDER — CHLORHEXIDINE GLUCONATE CLOTH 2 % EX PADS
6.0000 | MEDICATED_PAD | Freq: Every day | CUTANEOUS | Status: DC
  Administered 2015-07-09 – 2015-07-11 (×3): 6 via TOPICAL

## 2015-07-09 MED ORDER — MUPIROCIN 2 % EX OINT
1.0000 "application " | TOPICAL_OINTMENT | Freq: Two times a day (BID) | CUTANEOUS | Status: DC
  Administered 2015-07-10 – 2015-07-11 (×3): 1 via NASAL
  Filled 2015-07-09: qty 22

## 2015-07-09 MED ORDER — POTASSIUM CHLORIDE 20 MEQ PO PACK
20.0000 meq | PACK | Freq: Three times a day (TID) | ORAL | Status: AC
  Administered 2015-07-09 (×3): 20 meq via ORAL
  Filled 2015-07-09 (×2): qty 1

## 2015-07-09 NOTE — Progress Notes (Addendum)
   Subjective: 1 Day Post-Op Procedure(s) (LRB): TOTAL HIP ARTHROPLASTY ANTERIOR APPROACH (Right) Patient reports pain as 8 on 0-10 scale.   Patient is well, and has had no acute complaints or problems Denies any CP, SOB, ABD pain. We will continue therapy today.  Plan is to go Rehab after hospital stay.  Objective: Vital signs in last 24 hours: Temp:  [92.7 F (33.7 C)-100.3 F (37.9 C)] 98.1 F (36.7 C) (06/21 0745) Pulse Rate:  [63-112] 91 (06/21 0745) Resp:  [14-20] 18 (06/21 0745) BP: (91-168)/(6-88) 132/67 mmHg (06/21 0745) SpO2:  [97 %-100 %] 98 % (06/21 0745) Weight:  [114.76 kg (253 lb)] 114.76 kg (253 lb) (06/20 0841)  Intake/Output from previous day: 06/20 0701 - 06/21 0700 In: 2761.7 [P.O.:480; I.V.:2081.7; IV Piggyback:200] Out: 2420 [Urine:1850; Drains:70; Blood:500] Intake/Output this shift:     Recent Labs  07/08/15 1605 07/09/15 0628  HGB 11.5* 10.6*    Recent Labs  07/08/15 1605 07/09/15 0628  WBC 13.4* 10.8*  RBC 4.01* 3.56*  HCT 34.2* 30.0*  PLT 170 162    Recent Labs  07/08/15 1605 07/09/15 0628  NA  --  134*  K  --  3.4*  CL  --  100*  CO2  --  24  BUN  --  18  CREATININE 0.98 0.89  GLUCOSE  --  157*  CALCIUM  --  8.6*   No results for input(s): LABPT, INR in the last 72 hours.  EXAM General - Patient is Alert, Appropriate and Oriented Extremity - Neurovascular intact Sensation intact distally Intact pulses distally Dorsiflexion/Plantar flexion intact No cellulitis present Dressing - dressing C/D/I and wound vac and hemovac intact Motor Function - intact, moving foot and toes well on exam.   Past Medical History  Diagnosis Date  . Hypertension   . Kidney stones   . Plantar fascial fibromatosis   . Hyperlipidemia   . Diabetes mellitus without complication (HCC)   . GERD (gastroesophageal reflux disease)     Assessment/Plan:   1 Day Post-Op Procedure(s) (LRB): TOTAL HIP ARTHROPLASTY ANTERIOR APPROACH  (Right) Active Problems:   Primary osteoarthritis of right hip  Estimated body mass index is 38.48 kg/(m^2) as calculated from the following:   Height as of this encounter: 5\' 8"  (1.727 m).   Weight as of this encounter: 114.76 kg (253 lb). Advance diet Up with therapy  Needs BM CM to assist with discharge  Acute post op blood loss anemia- stable, recheck labs in the am  Hypokalemia- klor kon 20meq TID, rehcheck K in the am    DVT Prophylaxis - Lovenox, Foot Pumps and TED hose Weight-Bearing as tolerated to right leg   T. Cranston Neighborhris Shivali Quackenbush, PA-C [facility] 07/09/2015, 8:20 AM

## 2015-07-09 NOTE — Anesthesia Postprocedure Evaluation (Signed)
Anesthesia Post Note  Patient: Joshua Lee  Procedure(s) Performed: Procedure(s) (LRB): TOTAL HIP ARTHROPLASTY ANTERIOR APPROACH (Right)  Patient location during evaluation: Nursing Unit Anesthesia Type: Spinal Level of consciousness: awake and alert and oriented Pain management: satisfactory to patient Vital Signs Assessment: post-procedure vital signs reviewed and stable Respiratory status: respiratory function stable Cardiovascular status: stable Postop Assessment: no headache, no backache, spinal receding, patient able to bend at knees, no signs of nausea or vomiting and adequate PO intake Anesthetic complications: no    Last Vitals:  Filed Vitals:   07/09/15 0014 07/09/15 0404  BP: 138/6 124/80  Pulse: 90 92  Temp: 35.8 C 37.9 C  Resp: 19 19    Last Pain:  Filed Vitals:   07/09/15 0652  PainSc: 8                  Clydene PughBeane, Angelo Caroll D

## 2015-07-09 NOTE — Evaluation (Signed)
Occupational Therapy Evaluation Patient Details Name: Joshua Lee MRN: [id number] DOB: [birth year] Today's Date: 07/09/2015    History of Present Illness 50 yo M with OA of R hip is s/p R anterior THR.    Clinical Impression   Patient lives alone in a townhome with one step to enter, he has a cane he used at times prior to hospitalization.  He works full time and wants to get back to his job after recovery.  He has a supportive mom who can help with meal preparation, light homemaking and errands but cannot offer physical help.  Patient presents with muscle weakness in RLE, decreased transfers, decreased functional mobility, decreased ability to perform ADL and IADL tasks and would benefit from skilled OT to maximize safety and independence to eventually return home to live alone.  He may benefit from SNF at this time due to current level of assistance for basic self care tasks.     Follow Up Recommendations  SNF (depending on progress, increased pain this date. )    Equipment Recommendations  3 in 1 bedside comode    Recommendations for Other Services       Precautions / Restrictions Precautions Precautions: Fall;Anterior Hip Precaution Booklet Issued: No Restrictions Weight Bearing Restrictions: Yes RLE Weight Bearing: Weight bearing as tolerated      Mobility Bed Mobility                  Transfers Overall transfer level: Needs assistance               General transfer comment: moderate assist per PT plus cues    Balance                                            ADL Overall ADL's : Needs assistance/impaired Eating/Feeding: Set up;Sitting   Grooming: Set up;Sitting Grooming Details (indicate cue type and reason): unable to tolerate standing at the sink for grooming tasks at this time. Upper Body Bathing: Set up;Minimal assitance   Lower Body Bathing: Set up;Moderate assistance   Upper Body Dressing : Set up;Modified  independent   Lower Body Dressing: Set up;Moderate assistance   Toilet Transfer: Moderate assistance Toilet Transfer Details (indicate cue type and reason): per clinical judgment           General ADL Comments: Patient lethargic this date after taking pain meds, required mod assist with PT earlier for transfer, will plan to further address toilet transfers next session.      Vision     Perception     Praxis      Pertinent Vitals/Pain Pain Assessment: 0-10 Pain Score: 10-Worst pain ever Pain Location: right hip and knee area, just had pain meds prior to therapist coming in  Pain Descriptors / Indicators: Aching Pain Intervention(s): Limited activity within patient's tolerance;Monitored during session;Premedicated before session     Hand Dominance Right   Extremity/Trunk Assessment Upper Extremity Assessment Upper Extremity Assessment: Overall WFL for tasks assessed   Lower Extremity Assessment Lower Extremity Assessment: RLE deficits/detail RLE Deficits / Details: strength grossly 3/5, limited by pain RLE: Unable to fully assess due to pain       Communication Communication Communication: No difficulties   Cognition Arousal/Alertness: Lethargic;Suspect due to medications Behavior During Therapy: [facility] for tasks assessed/performed Overall Cognitive Status: Within Functional Limits for tasks assessed  General Comments       Exercises       Shoulder Instructions      Home Living Family/patient expects to be discharged to:: Private residence Living Arrangements: Alone Available Help at Discharge: Family (mom can help with meals and light housekeeping) Type of Home: House Home Access: Stairs to enter Entergy CorporationEntrance Stairs-Number of Steps: 1 Entrance Stairs-Rails: None Home Layout: One level     Bathroom Shower/Tub: Walk-in shower;Door   Foot LockerBathroom Toilet: Standard     Home Equipment: Cane - single point          Prior  Functioning/Environment Level of Independence: Independent        Comments: Pt I with ADLs, ambulation without AD, working full time, driving    OT Diagnosis: Generalized weakness;Acute pain;Other (comment) (weakness in RLE affecting ability to perform self care tasks, transfers and functional mobility skills. )   OT Problem List: Decreased strength;Impaired balance (sitting and/or standing);Decreased knowledge of precautions;Pain;Decreased range of motion;Decreased activity tolerance;Decreased knowledge of use of DME or AE   OT Treatment/Interventions: Self-care/ADL training;Therapeutic exercise;Patient/family education;Balance training;DME and/or AE instruction;Therapeutic activities    OT Goals(Current goals can be found in the care plan section) Acute Rehab OT Goals Patient Stated Goal: "be able to take care of myself and walk" OT Goal Formulation: With patient/family Time For Goal Achievement: 07/23/15 Potential to Achieve Goals: Good  OT Frequency: Min 1X/week   Barriers to D/C:    patient lives alone, mom can help with meal prep but can not offer physical assistance.         Co-evaluation              End of Session    Activity Tolerance: Patient limited by lethargy;Patient limited by pain Patient left: in chair;with call bell/phone within reach;with chair alarm set;with family/visitor present   Time: [id number] OT Time Calculation (min): 21 min Charges:  OT General Charges $OT Visit: 1 Procedure OT Evaluation $OT Eval Low Complexity: 1 Procedure G-Codes:    Brier Reid T Malessa Zartman, OTR/L, CLT  Kourtnie Sachs 07/09/2015, 1:57 PM

## 2015-07-09 NOTE — Plan of Care (Signed)
Problem: Activity: Goal: Ability to avoid complications of mobility impairment will improve Outcome: Progressing Pt dangled complete during previous shift.   Problem: Pain Management: Goal: Pain level will decrease with appropriate interventions Outcome: Progressing Controlled with PRN medication.

## 2015-07-09 NOTE — Clinical Social Work Note (Signed)
Clinical Social Work Assessment  Patient Details  Name: Joshua Lee MRN: [id number] Date of Birth: [birth year]  Date of referral:  07/09/15               Reason for consult:  Facility Placement                Permission sought to share information with:  Chartered certified accountant granted to share information::  Yes, Verbal Permission Granted  Name::      [place]::   [place]   Relationship::     Contact Information:     Housing/Transportation Living arrangements for the past 2 months:  [place] of Information:  Patient Patient Interpreter Needed:  None Criminal Activity/Legal Involvement Pertinent to Current Situation/Hospitalization:  No - Comment as needed Significant Relationships:  Parents Lives with:  Self Do you feel safe going back to the place where you live?  Yes Need for family participation in patient care:  Yes (Comment)  Care giving concerns: Patient lives alone in [place].    Social Worker assessment / plan:  Holiday representative (CSW) received SNF consult. PT is recommending SNF. CSW met with patient alone at bedside. CSW introduced self and explained role of CSW department. Patient was alert and oriented and was sitting up in the chair. Per patient he lives alone in [place] and his mother is his primary support. CSW explained SNF process and that [place] will have to approve SNF stay. Per patient he was working but is applying for short term disability now. CSW explained that if patient is ready for D/C and BCBS authorization is still pending patient will have to go home with home health or pay out of pocket for SNF. Per patient he will go home with [facility] if BCBS is still pending on day of D/C. Patient is agreeable to SNF search.   FL2 complete and faxed out. CSW presented bed offers to patient he chose [facility]. Doug admissions coordinator at [facility] has started [employer] today. CSW will continue to follow and assist as needed.    Employment status:  Disabled (Comment on whether or not currently receiving Disability), Full-Time Insurance information:  Managed Care PT Recommendations:  [place] / Referral to community resources:  [place]  Patient/Family's Response to care:  Patient is agreeable to going to [facility].   Patient/Family's Understanding of and Emotional Response to Diagnosis, Current Treatment, and Prognosis:  Patient was pleasant and thanked CSW for visit.   Emotional Assessment Appearance:  Appears stated age Attitude/Demeanor/Rapport:    Affect (typically observed):  Accepting, Adaptable, Pleasant Orientation:  Oriented to Self, Oriented to Place, Oriented to  Time, Oriented to Situation Alcohol / Substance use:  Not Applicable Psych involvement (Current and /or in the community):  No (Comment)  Discharge Needs  Concerns to be addressed:  Discharge Planning Concerns Readmission within the last 30 days:  No Current discharge risk:  Dependent with Mobility Barriers to Discharge:  Continued Medical Work up   Loralyn Freshwater, LCSW 07/09/2015, 1:50 PM

## 2015-07-09 NOTE — Progress Notes (Signed)
[facility] has been received. Patient is aware. [facility] if medically stable.   Jetta LoutBailey Morgan, LCSW [phone]

## 2015-07-09 NOTE — Evaluation (Signed)
Physical Therapy Evaluation Patient Details Name: Joshua Lee MRN: [id number] DOB: 29-Nov-1965 Today's Date: 07/09/2015   History of Present Illness  50 yo M with OA of R hip is s/p R anterior THR.   Clinical Impression  Pt demonstrated generalized weakness of R LE and difficulty walking with acute pain s/p R THR. He has limited tolerance of mobility of R LE due to pain. Pt required min A for bed mobility with use of rail. Transfers and limited ambulation of 5 ft with FWW required mod A and max cues for technique. Due to pt's limited mobility, STR is recommended to address deficits of strength, ROM, balance and gait to progress towards PLOF. Pt will benefit from skilled PT services to increase functional I and mobility for safe discharge.     Follow Up Recommendations SNF    Equipment Recommendations  None recommended by PT (pt reports he has recommended FWW)    Recommendations for Other Services       Precautions / Restrictions Precautions Precautions: Fall;Anterior Hip Precaution Booklet Issued: No Restrictions Weight Bearing Restrictions: Yes RLE Weight Bearing: Weight bearing as tolerated      Mobility  Bed Mobility Overal bed mobility: Needs Assistance Bed Mobility: Supine to Sit     Supine to sit: Min assist;HOB elevated     General bed mobility comments: difficulty moving R LE, uses rails, difficulty getting trunk upright  Transfers Overall transfer level: Needs assistance Equipment used: Rolling walker (2 wheeled) Transfers: Sit to/from [employer] Sit to Stand: Mod assist;From elevated surface Stand pivot transfers: Mod assist;From elevated surface       General transfer comment: cues for hand and feet placement, pushing down with LEs to reduce weight bearing and reduce pain, sequence  Ambulation/Gait Ambulation/Gait assistance: Mod assist Ambulation Distance (Feet): 5 Feet Assistive device: Rolling walker (2 wheeled) Gait  Pattern/deviations: Step-to pattern;Antalgic;Decreased weight shift to right;Trunk flexed Gait velocity: reduced Gait velocity interpretation: Below normal speed for age/gender General Gait Details: Extreme difficulty due to pain in R hip. Requires extra time to complete limited ambulation.  Stairs            Wheelchair Mobility    Modified Rankin (Stroke Patients Only)       Balance Overall balance assessment: Needs assistance Sitting-balance support: Bilateral upper extremity supported;Feet supported Sitting balance-Leahy Scale: Good     Standing balance support: Bilateral upper extremity supported Standing balance-Leahy Scale: Fair Standing balance comment: limited by R hip pain                             Pertinent Vitals/Pain Pain Assessment: 0-10 Pain Score: 10-Worst pain ever Pain Location: R hip Pain Descriptors / Indicators: Aching Pain Intervention(s): Limited activity within patient's tolerance;Monitored during session;Premedicated before session    Home Living Family/patient expects to be discharged to:: Private residence Living Arrangements: Alone   Type of Home: House Home Access: Stairs to enter Entrance Stairs-Rails: None Entrance Stairs-Number of Steps: 1 Home Layout: One level Home Equipment: Environmental consultant - 2 wheels (borrowing a FWW)      Prior Function Level of Independence: Independent         Comments: Pt I with ADLs, ambulation without AD, working     Hand Dominance        Extremity/Trunk Assessment   Upper Extremity Assessment: Overall WFL for tasks assessed           Lower Extremity Assessment: RLE deficits/detail  RLE Deficits / Details: strength grossly 3/5, limited by pain       Communication   Communication: No difficulties  Cognition Arousal/Alertness: Awake/alert Behavior During Therapy: WFL for tasks assessed/performed Overall Cognitive Status: Within Functional Limits for tasks assessed                       General Comments General comments (skin integrity, edema, etc.): hemovac, wound vac, low pain tolerance    Exercises Other Exercises Other Exercises: R LE supine therex: ankle pumps/QS/GS (B), heel slides and hip abd slides with AAROM x15 each. Pt highly limited by pain and has poor tolerance for mobility of R LE. Increased time required to complete.      Assessment/Plan    PT Assessment Patient needs continued PT services  PT Diagnosis Difficulty walking;Generalized weakness;Acute pain   PT Problem List Decreased strength;Decreased range of motion;Decreased activity tolerance;Decreased balance;Decreased knowledge of use of DME;Pain  PT Treatment Interventions DME instruction;Gait training;Stair training;Therapeutic activities;Therapeutic exercise;Balance training;Neuromuscular re-education;Patient/family education   PT Goals (Current goals can be found in the Care Plan section) Acute Rehab PT Goals Patient Stated Goal: to return to normal PT Goal Formulation: With patient Time For Goal Achievement: 07/23/15 Potential to Achieve Goals: Good    Frequency BID   Barriers to discharge Inaccessible home environment;Decreased caregiver support step to enter, lives alone    Co-evaluation               End of Session Equipment Utilized During Treatment: Gait belt Activity Tolerance: Patient limited by pain Patient left: in chair;with call bell/phone within reach;with chair alarm set;with SCD's reapplied Nurse Communication: Mobility status         Time: [id number] PT Time Calculation (min) (ACUTE ONLY): 31 min   Charges:   PT Evaluation $PT Eval Moderate Complexity: 1 Procedure PT Treatments $Therapeutic Exercise: 8-22 mins   PT G Codes:        Adelene IdlerMindy Jo Miles Borkowski, PT, DPT  07/09/2015, 10:04 AM [phone]

## 2015-07-09 NOTE — Care Management Note (Signed)
Case Management Note  Patient Details  Name: Joshua Lee MRN: [id number] Date of Birth: 09-Jul-1965  Subjective/Objective:                  Met with patient to discuss discharge planning. Patient is from home alone with supportive mother that was at his bedside yesterday. He has borrowed a rolling walker which I've asked him to have brought in for PT to evaluate. He uses [facility] [address] for Rx [phone]. He agrees with SNF if needed. PT pending. He would like to use [facility] if HHPT is recommended.  Action/Plan: List of home health agencies left with patient. Referral to [facility]. RNCM to follow for rolling walker need also. Lovenox 7m #14 [facility] for price. RNCM will continue to follow.   Expected Discharge Date:                  Expected Discharge Plan:     In-House Referral:     Discharge planning Services  CM Consult  Post Acute Care Choice:  Home Health Choice offered to:  Patient  DME Arranged:    DME Agency:     [facility] Arr[facility]  [facility]  Status of Service:  In process, will continue to follow  If discussed at Long Length of Stay Meetings, dates discussed:    Additional Comments:  AMarshell Garfinkel RN 07/09/2015, 8:28 AM

## 2015-07-09 NOTE — Clinical Social Work Placement (Signed)
   CLINICAL SOCIAL WORK PLACEMENT  NOTE  Date:  07/09/2015  Patient Details  Name: Joshua Lee MRN: [id number] Date of Birth: 04/21/1965  Clinical Social Work is seeking post-discharge placement for this patient at the Skilled  Nursing Facility level of care (*CSW will initial, date and re-position this form in  chart as items are completed):  Yes   Patient/family provided with [facility] Clinical Social Work Department's list of facilities offering this level of care within the geographic area requested by the patient (or if unable, by the patient's family).  Yes   Patient/family informed of their freedom to choose among providers that offer the needed level of care, that participate in Medicare, Medicaid or managed care program needed by the patient, have an available bed and are willing to accept the patient.  Yes   Patient/family informed of [facility] ownership interest in [facility] and [facility], as well as of the fact that they are under no obligation to receive care at these facilities.  PASRR submitted to EDS on 07/09/15     PASRR number received on 07/09/15     Existing PASRR number confirmed on       FL2 transmitted to all facilities in geographic area requested by pt/family on 07/09/15     FL2 transmitted to all facilities within larger geographic area on       Patient informed that his/her managed care company has contracts with or will negotiate with certain facilities, including the following:        Yes   Patient/family informed of bed offers received.  Patient chooses bed at  [facility]     Physician recommends and patient chooses bed at      Patient to be transferred to   on  .  Patient to be transferred to facility by       Patient family notified on   of transfer.  Name of family member notified:        PHYSICIAN       Additional Comment:    _______________________________________________ Haig ProphetMorgan, Joshua Brumbaugh G,  LCSW 07/09/2015, 1:49 PM

## 2015-07-09 NOTE — Progress Notes (Signed)
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     PT Comments    Pt continues to be highly limited by pain despite being pre-medicated before session. He requires mod A for transfers and min A +2 for safety/equipment with FWW for ambulation up to 40 ft. Very slow speed. Cues for increased step length and staying close to [facility]. Pt also self-limits himself to some extent with mobility. Plan to progress mobility and strengthening as tolerated.  Follow Up Recommendations  SNF     Equipment Recommendations  None recommended by PT    Recommendations for Other Services       Precautions / Restrictions Precautions Precautions: Fall;Anterior Hip Precaution Booklet Issued: No Restrictions Weight Bearing Restrictions: Yes RLE Weight Bearing: Weight bearing as tolerated    Mobility  Bed Mobility               General bed mobility comments: Up in recliner, NT  Transfers Overall transfer level: Needs assistance Equipment used: Rolling walker (2 wheeled) Transfers: Sit to/from [employer];Stand Pivot Transfers Sit to Stand: Mod assist;From elevated surface Stand pivot transfers: Mod assist;From elevated surface       General transfer comment: cues for hand and feet placement, pushing down with LEs to reduce weight bearing and reduce pain, sequence  Ambulation/Gait Ambulation/Gait assistance: Min assist;+2 safety/equipment Ambulation Distance (Feet): 40 Feet Assistive device: Rolling walker (2 wheeled) Gait Pattern/deviations: Step-to pattern;Decreased weight shift to right Gait velocity: reduced Gait velocity interpretation: Below normal speed for age/gender General Gait Details: Extreme difficulty due to pain in R hip. Requires extra time to complete limited ambulation.Cues for increased step length and staying close to  [facility].   Stairs            Wheelchair Mobility    Modified Rankin (Stroke Patients Only)       Balance Overall balance assessment: Needs assistance Sitting-balance support: Bilateral upper extremity supported Sitting balance-Leahy Scale: Good     Standing balance support: Bilateral upper extremity supported Standing balance-Leahy Scale: Fair                      Cognition Arousal/Alertness: Awake/alert Behavior During Therapy: WFL for tasks assessed/performed Overall Cognitive Status: Within Functional Limits for tasks assessed                      Exercises Other Exercises Other Exercises: Pt ambulated 40 ft with FWW and min A +2 for safety/equipment. Very slow gait. Limited by R hip pain. Cues for increased step length and staying close to [facility]. Extra time required to complete this gait distance.    General Comments        Pertinent Vitals/Pain Pain Assessment: 0-10 Pain Score: 10-Worst pain ever Pain Location: R hip Pain Descriptors / Indicators: Aching Pain Intervention(s): Limited activity within patient's tolerance;Monitored during session;Premedicated before session    Home Living Family/patient expects to be discharged to:: Private residence Living Arrangements: Alone Available Help at Discharge: Family (mom can help with meals and light housekeeping) Type of Home: House Home Access: Stairs to enter Entrance Stairs-Rails: None Home Layout: One level Home Equipment: Cane - single point      Prior Function Level of Independence: Independent      Comments: Pt I with ADLs, ambulation without AD, working full time,  driving   PT Goals (current goals can now be found in the care plan section) Acute Rehab PT Goals Patient Stated Goal: "be able to take care of myself and walk" PT Goal Formulation: With patient Time For Goal Achievement: 07/23/15 Potential to Achieve Goals: Good Progress towards PT goals: Progressing toward goals     Frequency  BID    PT Plan Current plan remains appropriate    Co-evaluation             End of Session Equipment Utilized During Treatment: Gait belt Activity Tolerance: Patient limited by pain Patient left: in chair;with call bell/phone within reach;with chair alarm set;with SCD's reapplied     Time: 1347-1415 PT Time Calculation (min) (ACUTE ONLY): 28 min  Charges:  $Gait Training: 23-37 mins                    G Codes:      Adelene Idler, PT, DPT  07/09/2015, 3:37 PM [phone]

## 2015-07-09 NOTE — Anesthesia Post-op Follow-up Note (Signed)
  Anesthesia Pain Follow-up Note  Patient: Joshua Lee  Day #: 1  Date of Follow-up: 07/09/2015 Time: 7:07 AM  Last Vitals:  Filed Vitals:   07/09/15 0014 07/09/15 0404  BP: 138/6 124/80  Pulse: 90 92  Temp: 35.8 C 37.9 C  Resp: 19 19    Level of Consciousness: alert  Pain: mild   Side Effects:None  Catheter Site Exam: N/A  Anti-Coag Meds    Start     Dose/Rate Route Frequency Ordered Stop   07/09/15 0800  enoxaparin (LOVENOX) injection 40 mg     40 mg Subcutaneous Every 24 hours 07/08/15 1520         Plan: D/C from anesthesia care  Clydene PughBeane, Lewi Drost D

## 2015-07-10 LAB — BASIC METABOLIC PANEL
Anion gap: 13 (ref 5–15)
Anion gap: 13 (ref 5–15)
BUN: 26 mg/dL — AB (ref 6–20)
BUN: 32 mg/dL — AB (ref 6–20)
CHLORIDE: 97 mmol/L — AB (ref 101–111)
CHLORIDE: 96 mmol/L — AB (ref 101–111)
CO2: 24 mmol/L (ref 22–32)
CO2: 22 mmol/L (ref 22–32)
CREATININE: 1.28 mg/dL — AB (ref 0.61–1.24)
CREATININE: 2.33 mg/dL — AB (ref 0.61–1.24)
Calcium: 9.1 mg/dL (ref 8.9–10.3)
Calcium: 8.6 mg/dL — ABNORMAL LOW (ref 8.9–10.3)
GFR calc Af Amer: >60 mL/min (ref >60)
GFR calc non Af Amer: >60 mL/min (ref >60)
GFR calc non Af Amer: 31 mL/min — ABNORMAL LOW (ref >60)
GFR, EST AFRICAN AMERICAN: 36 mL/min — AB (ref >60)
Glucose, Bld: 191 mg/dL — ABNORMAL HIGH (ref 65–99)
Glucose, Bld: 157 mg/dL — ABNORMAL HIGH (ref 65–99)
Potassium: 3.1 mmol/L — ABNORMAL LOW (ref 3.5–5.1)
Potassium: 3.5 mmol/L (ref 3.5–5.1)
Sodium: 134 mmol/L — ABNORMAL LOW (ref 135–145)
Sodium: 131 mmol/L — ABNORMAL LOW (ref 135–145)

## 2015-07-10 LAB — CBC
HEMATOCRIT: 30.4 % — AB (ref 40.0–52.0)
HEMOGLOBIN: 10.6 g/dL — AB (ref 13.0–18.0)
MCH: 29.2 pg (ref 26.0–34.0)
MCHC: 34.8 g/dL (ref 32.0–36.0)
MCV: 83.8 fL (ref 80.0–100.0)
Platelets: 173 10*3/uL (ref 150–440)
RBC: 3.63 MIL/uL — ABNORMAL LOW (ref 4.40–5.90)
RDW: 14.6 % — ABNORMAL HIGH (ref 11.5–14.5)
WBC: 12.5 10*3/uL — ABNORMAL HIGH (ref 3.8–10.6)

## 2015-07-10 LAB — GLUCOSE, CAPILLARY
GLUCOSE-CAPILLARY: 166 mg/dL — AB (ref 65–99)
Glucose-Capillary: 153 mg/dL — ABNORMAL HIGH (ref 65–99)

## 2015-07-10 LAB — SURGICAL PATHOLOGY

## 2015-07-10 MED ORDER — INSULIN ASPART 100 UNIT/ML ~~LOC~~ SOLN: 0.0000 [IU] | Freq: Every day | SUBCUTANEOUS | Status: DC

## 2015-07-10 MED ORDER — POTASSIUM CHLORIDE 20 MEQ PO PACK
40.0000 meq | PACK | Freq: Three times a day (TID) | ORAL | Status: AC
  Administered 2015-07-10 (×3): 40 meq via ORAL
  Filled 2015-07-10 (×3): qty 2

## 2015-07-10 MED ORDER — SODIUM CHLORIDE 0.9 % IV BOLUS (SEPSIS): 1000.0000 mL | INTRAVENOUS | Status: DC | PRN

## 2015-07-10 MED ORDER — SODIUM CHLORIDE 0.9 % IV BOLUS (SEPSIS)
1000.0000 mL | Freq: Once | INTRAVENOUS | Status: AC
  Administered 2015-07-10: 1000 mL via INTRAVENOUS

## 2015-07-10 MED ORDER — INSULIN ASPART 100 UNIT/ML ~~LOC~~ SOLN
0.0000 [IU] | Freq: Three times a day (TID) | SUBCUTANEOUS | Status: DC
  Administered 2015-07-10: 2 [IU] via SUBCUTANEOUS
  Administered 2015-07-11: 5 [IU] via SUBCUTANEOUS
  Filled 2015-07-10: qty 5
  Filled 2015-07-10: qty 2

## 2015-07-10 NOTE — Consult Note (Signed)
[facility] [place] [facility]   PATIENT NAME: Joshua Lee    MR#:  [id number]  DATE OF BIRTH:  12/10/1965  DATE OF ADMISSION:  07/08/2015  PRIMARY CARE PHYSICIAN: Vonita MossMark Crissman, MD   REQUESTING/REFERRING PHYSICIAN: Dr. Rosita KeaMenz.  CHIEF COMPLAINT:  No chief complaint on file.  Hypotension and dehydration HISTORY OF PRESENT ILLNESS:  Joshua Lee  is a 50 y.o. male with a known history of Hypertension, hyperlipidemia, diabetes and GERD. The patient is a status of right hip arthroplasty, status post surgery day 2. He was found hypotensive with blood pressure at 76/58 and dehydration. Dr. Rosita KeaMenz request medical consult. Patient only complains of poor oral intake but denies any other symptoms.  PAST MEDICAL HISTORY:   Past Medical History  Diagnosis Date  . Hypertension   . Kidney stones   . Plantar fascial fibromatosis   . Hyperlipidemia   . Diabetes mellitus without complication (HCC)   . GERD (gastroesophageal reflux disease)     PAST SURGICAL HISTOIRY:   Past Surgical History  Procedure Laterality Date  . Gallbladder surgery    . Cholecystectomy  2006  . Kidney stone surgery    . Knee arthroscopy    . Total hip arthroplasty Right 07/08/2015    Procedure: TOTAL HIP ARTHROPLASTY ANTERIOR APPROACH;  Surgeon: Kennedy BuckerMichael Menz, MD;  Location: [facility] ORS;  Service: Orthopedics;  Laterality: Right;    SOCIAL HISTORY:   Social History  Substance Use Topics  . Smoking status: Never Smoker   . Smokeless tobacco: Current User  . Alcohol Use: 0.0 oz/week    0 Standard drinks or equivalent per week     Comment: Occasionally drinks beer    FAMILY HISTORY:   Family History  Problem Relation Age of Onset  . Diabetes Maternal Uncle     DRUG ALLERGIES:   Allergies  Allergen Reactions  . Penicillins Rash    Has patient had a PCN reaction causing immediate rash, facial/tongue/throat swelling, SOB or lightheadedness with hypotension: unknown Has  patient had a PCN reaction causing severe rash involving mucus membranes or skin necrosis: unknown Has patient had a PCN reaction that required hospitalization: unknown Has patient had a PCN reaction occurring within the last 10 years: no If all of the above answers are "NO", then may proceed with Cephalosporin use.     REVIEW OF SYSTEMS:  CONSTITUTIONAL: No fever, fatigue or weakness. But has poor appetite. EYES: No blurred or double vision.  EARS, NOSE, AND THROAT: No tinnitus or ear pain.  RESPIRATORY: No cough, shortness of breath, wheezing or hemoptysis.  CARDIOVASCULAR: No chest pain, orthopnea, edema.  GASTROINTESTINAL: No nausea, vomiting, diarrhea or abdominal pain.  GENITOURINARY: No dysuria, hematuria.  ENDOCRINE: No polyuria, nocturia,  HEMATOLOGY: No anemia, easy bruising or bleeding SKIN: No rash or lesion. MUSCULOSKELETAL: No joint pain or arthritis.   NEUROLOGIC: No tingling, numbness, weakness.  PSYCHIATRY: No anxiety or depression.   MEDICATIONS AT HOME:   Prior to Admission medications   Medication Sig Start Date End Date Taking? Authorizing Provider  acetaminophen (TYLENOL) 500 MG tablet Take 500-1,500 mg by mouth every 8 (eight) hours as needed (pain).    Yes Historical Provider, MD  acetaminophen (TYLENOL) 650 MG CR tablet Take 650-1,950 mg by mouth every 8 (eight) hours as needed for pain.   Yes Historical Provider, MD  acetaminophen-codeine (TYLENOL #3) 300-30 MG tablet Take 1 tablet by mouth every 6 (six) hours as needed for moderate pain. Patient taking differently: Take  1 tablet by mouth every 8 (eight) hours as needed for moderate pain.  06/24/15  Yes Vivi Barrack, DPM  aspirin 81 MG tablet Take 81 mg by mouth daily.   Yes Historical Provider, MD  atorvastatin (LIPITOR) 80 MG tablet Take 80 mg by mouth at bedtime.    Yes Historical Provider, MD  benazepril (LOTENSIN) 40 MG tablet Take 1 tablet (40 mg total) by mouth daily. 12/02/14  Yes Steele Sizer,  MD  calcium carbonate (TUMS EX) 750 MG chewable tablet Chew 2 tablets by mouth daily as needed for heartburn.    Yes Historical Provider, MD  cetirizine (ZYRTEC) 10 MG tablet Take 10 mg by mouth daily as needed for allergies.    Yes Historical Provider, MD  Coenzyme Q-10 100 MG capsule Take 100 mg by mouth daily.   Yes Historical Provider, MD  dextromethorphan-guaiFENesin (MUCINEX DM) 30-600 MG 12hr tablet Take 1 tablet by mouth 2 (two) times daily as needed for cough.   Yes Historical Provider, MD  Garlic 1000 MG CAPS Take 1,000 mg by mouth daily.   Yes Historical Provider, MD  hydrochlorothiazide (MICROZIDE) 12.5 MG capsule Take 1 capsule (12.5 mg total) by mouth daily. 12/02/14  Yes Steele Sizer, MD  HYDROcodone-acetaminophen (NORCO) 5-325 MG tablet Take 1 tablet by mouth every 6 (six) hours as needed for moderate pain. 06/14/15  Yes Evon Slack, PA-C  ibuprofen (ADVIL,MOTRIN) 800 MG tablet Take 1 tablet (800 mg total) by mouth every 8 (eight) hours as needed. Patient taking differently: Take 800 mg by mouth every 8 (eight) hours as needed (pain).  06/24/15  Yes Vivi Barrack, DPM  ketoconazole (NIZORAL) 2 % cream Apply 1 application topically daily. Applies to groin for yeast infection.   Yes Historical Provider, MD  lidocaine (XYLOCAINE) 5 % ointment Apply 1 application topically 2 (two) times daily as needed. For foot pain 04/18/15  Yes Historical Provider, MD  metFORMIN (GLUCOPHAGE) 500 MG tablet Take 1 tablet (500 mg total) by mouth 2 times daily at 12 noon and 4 pm. Patient taking differently: Take 500 mg by mouth 2 (two) times daily.  12/02/14  Yes Steele Sizer, MD  metoprolol (LOPRESSOR) 100 MG tablet Take 1 tablet (100 mg total) by mouth 2 (two) times daily. 12/02/14  Yes Steele Sizer, MD  Misc Natural Products (TURMERIC CURCUMIN) CAPS Take 1 capsule by mouth daily.   Yes Historical Provider, MD  Neomycin-Bacitracin-Polymyxin (NEOSPORIN EX) Apply 1 application topically as  needed.   Yes Historical Provider, MD  NONFORMULARY OR COMPOUNDED ITEM [facility]:  Combo Pain #1 - Aripiprazole 0.5%, Baclofen 3%, Gabapentin 8%, Ketorolac 8%, Lidocaine 5%, dispense 240 grams, apply 1-2 grams to affected area 3-4 times a day, refill prn. Patient taking differently: Apply 1-2 application topically 2 (two) times daily as needed. [facility]:  Combo Pain #1 - Aripiprazole 0.5%, Baclofen 3%, Gabapentin 8%, Ketorolac 8%, Lidocaine 5%, dispense 240 grams, apply 1-2 grams to affected area 3-4 times a day, refill prn. 04/22/15  Yes Vivi Barrack, DPM  Omega-3 Fatty Acids (FISH OIL PO) Take 1 capsule by mouth daily.   Yes Historical Provider, MD  Potassium Citrate 15 MEQ (1620 MG) TBCR Take 15 mEq by mouth at bedtime. 05/28/15  Yes Historical Provider, MD  tamsulosin (FLOMAX) 0.4 MG CAPS capsule Take 1 capsule (0.4 mg total) by mouth daily. 12/02/14  Yes Steele Sizer, MD  traMADol (ULTRAM) 50 MG tablet Take 1 tablet (50 mg total)  by mouth daily as needed. Patient taking differently: Take 50 mg by mouth daily as needed (pain).  06/04/15  Yes Steele SizerMark A Crissman, MD  Camphor-Phenol (CAMPHO-PHENIQUE EX) Apply 1 application topically as needed.    Historical Provider, MD  Ibuprofen (ADVIL) 200 MG CAPS Take 200 mg by mouth as needed.    Historical Provider, MD  Miconazole Nitrate (LOTRIMIN AF) 2 % AERO Apply 1 application topically daily as needed.    Historical Provider, MD  NAPROXEN SODIUM PO Take 500 mg by mouth 2 (two) times daily as needed.    Historical Provider, MD  ONE TOUCH ULTRA TEST test strip TEST once daily as directed 08/12/14   Steele SizerMark A Crissman, MD  [facility] DELICA LANCETS FINE MISC TEST once daily as directed 08/16/14   Gabriel Cirriheryl Wicker, NP  tolnaftate (TINACTIN) 1 % spray Apply 1 application topically as needed.    Historical Provider, MD      VITAL SIGNS:  Blood pressure 87/45, pulse 98, temperature 97.8 F (36.6 C), temperature source Oral, resp. rate 20, height 5'  8" (1.727 m), weight 253 lb (114.76 kg), SpO2 97 %.  PHYSICAL EXAMINATION:  GENERAL:  50 y.o.-year-old patient lying in the bed with no acute distress. Obese. EYES: Pupils equal, round, reactive to light and accommodation. No scleral icterus. Extraocular muscles intact.  HEENT: Head atraumatic, normocephalic. Oropharynx and nasopharynx clear.  NECK:  Supple, no jugular venous distention. No thyroid enlargement, no tenderness.  LUNGS: Normal breath sounds bilaterally, no wheezing, rales,rhonchi or crepitation. No use of accessory muscles of respiration.  CARDIOVASCULAR: S1, S2 normal. No murmurs, rubs, or gallops.  ABDOMEN: Soft, nontender, nondistended. Bowel sounds present. No organomegaly or mass.  EXTREMITIES: No pedal edema, cyanosis, or clubbing. Right hip and buttock in dressing. NEUROLOGIC: Cranial nerves II through XII are intact. Muscle strength 4-5/5 in all extremities. Sensation intact. Gait not checked.  PSYCHIATRIC: The patient is alert and oriented x 3.  SKIN: No obvious rash, lesion, or ulcer.   LABORATORY PANEL:   CBC  Recent Labs Lab 07/10/15 0636  WBC 12.5*  HGB 10.6*  HCT 30.4*  PLT 173   ------------------------------------------------------------------------------------------------------------------  Chemistries   Recent Labs Lab 07/10/15 1353  NA 131*  K 3.5  CL 96*  CO2 22  GLUCOSE 157*  BUN 32*  CREATININE 2.33*  CALCIUM 8.6*   ------------------------------------------------------------------------------------------------------------------  Cardiac Enzymes No results for input(s): TROPONINI in the last 168 hours. ------------------------------------------------------------------------------------------------------------------  RADIOLOGY:  No results found.  EKG:   Orders placed or performed during the hospital encounter of 04/12/15  . EKG 12-Lead  . EKG 12-Lead  . EKG    IMPRESSION AND PLAN:   Hypotension. Hold Lopressor and  lisinopril, give normal saline bolus, closely monitor vital sign.  Acute renal failure. Continue IV normal saline, follow-up BMP.  Hyponatremia. As above.  Diabetes. Start sliding scale.  All the records are reviewed and case discussed with Consulting provider. Management plans discussed with the patient, family and they are in agreement.  CODE STATUS: Full code  TOTAL TIME TAKING CARE OF THIS PATIENT: 52 minutes.    Shaune Pollackhen, Michio Thier M.D on 07/10/2015 at 5:14 PM  Between 7am to 6pm - Pager - [phone]  After 6pm go to [email or web address] - password [email or web address] [facility]  [facility]  Office  [phone]  CC: Primary care Physician: Vonita MossMark Crissman, MD

## 2015-07-10 NOTE — Progress Notes (Signed)
Physical Therapy Treatment Patient Details Name: Joshua BravoCharles T Broder MRN: [id number] DOB: 03/10/1965 Today's Date: 07/10/2015    History of Present Illness 50 yo M with OA of R hip is s/p R anterior THR.     PT Comments    Pt agreeable to PT and wishes to try and walk. No significant symptoms of low K+, which was 3.1 and has been supplemented. Pt progressing ambulation distance; however, speed continues to be extremely slow; partial step through antalgic gait with stiff pattern on right. Transfers are also very slow and effortful with increased time taken to "psyche" himself up for stand. Pt requests if several other people can help. Therapist explained pt does not need 2-3 people to get up, as he has demonstrated ability to do this this morning, and no significant changes have arisen since that time. Pt wished to stay up in chair and participated in long sit exercises with assist required for movement exercises. Continue PT to progress strength, endurance and all functional mobility.    Follow Up Recommendations  SNF     Equipment Recommendations  Rolling walker with 5" wheels    Recommendations for Other Services       Precautions / Restrictions Precautions Precautions: Fall;Anterior Hip Precaution Booklet Issued: No Restrictions Weight Bearing Restrictions: Yes RLE Weight Bearing: Weight bearing as tolerated    Mobility  Bed Mobility               General bed mobility comments: Not tested; up in chair  Transfers Overall transfer level: Needs assistance Equipment used: Rolling walker (2 wheeled) Transfers: Sit to/from Stand Sit to Stand: Min assist         General transfer comment: Heavy cueing for sequence and significant increased time  Ambulation/Gait Ambulation/Gait assistance: Min guard Ambulation Distance (Feet): 87 Feet Assistive device: Rolling walker (2 wheeled) Gait Pattern/deviations: Step-through pattern;Decreased step length - left;Decreased stance  time - right;Decreased dorsiflexion - right;Antalgic (Decreased R hip/knee flexion with swing phase) Gait velocity: reduced Gait velocity interpretation: <1.8 ft/sec, indicative of risk for recurrent falls (0.62 ft/sec) General Gait Details: Continues with partial step through and very slow  with numerous pauses. Decreased motion at hip, knee and ankle on R with swing phase and HS   Stairs            Wheelchair Mobility    Modified Rankin (Stroke Patients Only)       Balance Overall balance assessment: Needs assistance Sitting-balance support: Feet supported Sitting balance-Leahy Scale: Good     Standing balance support: Bilateral upper extremity supported Standing balance-Leahy Scale: Fair                      Cognition Arousal/Alertness: Awake/alert Behavior During Therapy: WFL for tasks assessed/performed Overall Cognitive Status: Within Functional Limits for tasks assessed       Memory: Decreased recall of precautions (Requires education with function)              Exercises Total Joint Exercises Ankle Circles/Pumps: AROM;Both;20 reps (long sit) Quad Sets: Strengthening;Both;10 reps (long sit) Gluteal Sets: Strengthening;Both;10 reps;Supine Heel Slides: AAROM;Right;10 reps (long sit) Hip ABduction/ADduction: PROM;Right;10 reps (long sit, limited range) Long Arc Quad: Other (comment) (attempted; c/o too much pain) Other Exercises Other Exercises: Set up for toiletting    General Comments        Pertinent Vitals/Pain Pain Assessment: 0-10 Pain Score: 8  Pain Location: R hip Pain Descriptors / Indicators: Constant;Aching;Jabbing;Sore;Tightness Pain Intervention(s): Premedicated before session;Monitored during  session    Home Living                      Prior Function            PT Goals (current goals can now be found in the care plan section) Progress towards PT goals: Progressing toward goals    Frequency  BID    PT  Plan Current plan remains appropriate    Co-evaluation             End of Session Equipment Utilized During Treatment: Gait belt Activity Tolerance: Patient limited by pain Patient left: in chair;with call bell/phone within reach;with chair alarm set     Time: [id number] PT Time Calculation (min) (ACUTE ONLY): 36 min  Charges:  $Gait Training: 8-22 mins $Therapeutic Exercise: 8-22 mins                    G Codes:      Kristeen MissHeidi Elizabeth Bishop, PTA 07/10/2015, 1:55 PM

## 2015-07-10 NOTE — Progress Notes (Signed)
Physical Therapy Treatment Patient Details Name: Joshua BravoCharles T Cott MRN: [id number] DOB: 12/29/1965 Today's Date: 07/10/2015    History of Present Illness 50 yo M with OA of R hip is s/p R anterior THR.     PT Comments    Pt agreeable to PT; notes having significant 8/10 pain, which increases with ambulation. Pt improving with less physical assistance required for sit to/from stand transfers. Pt does however, require heavy cueing for sequence. Ambulation also improving although quality continues poor and antalgic/stiff. Pt encouraged to take a longer walk after initial short walk and toiletting to work on quality and overall stiffness. Pt continues up in chair. Plan to see pt this afternoon for continued progress of range, strength, transfers and ambulation to improve functional mobility.   Follow Up Recommendations  SNF     Equipment Recommendations  Rolling walker with 5" wheels    Recommendations for Other Services       Precautions / Restrictions Precautions Precautions: Fall;Anterior Hip Precaution Booklet Issued: No Restrictions Weight Bearing Restrictions: Yes RLE Weight Bearing: Weight bearing as tolerated    Mobility  Bed Mobility               General bed mobility comments: Not tested; up in chair  Transfers Overall transfer level: Needs assistance Equipment used: Rolling walker (2 wheeled) Transfers: Sit to/from Stand Sit to Stand: Min assist         General transfer comment: Heavy cueing for sequence and significant increased time  Ambulation/Gait Ambulation/Gait assistance: Min guard Ambulation Distance (Feet): 30 Feet (Initial 6 ft) Assistive device: Rolling walker (2 wheeled) Gait Pattern/deviations: Step-to pattern;Decreased stride length;Decreased dorsiflexion - right;Decreased weight shift to right;Antalgic (very little clearance RLE) Gait velocity: reduced Gait velocity interpretation: <1.8 ft/sec, indicative of risk for recurrent  falls General Gait Details: Slow, effortful, antalgic. Initial 6 foot walk extremely stiff legged with nearly no clearance RLE,. Second long walk, mildly improved hip/knee flexion with swing phase, clearance mildly improved. Numerous pauses during ambulation. Pt does display unsafe/poor common sense toward end of walk as he notes he can barely make it to chair; pt stops and reaches over side of rw for a drink and then further to bed attempting to reach blanket. Pt stopped and instructed to finish chair approach and therapist will get blanket/pillow.    Stairs            Wheelchair Mobility    Modified Rankin (Stroke Patients Only)       Balance Overall balance assessment: Needs assistance Sitting-balance support: Feet supported Sitting balance-Leahy Scale: Good     Standing balance support: Bilateral upper extremity supported Standing balance-Leahy Scale: Fair                      Cognition Arousal/Alertness: Awake/alert Behavior During Therapy: WFL for tasks assessed/performed Overall Cognitive Status: Within Functional Limits for tasks assessed       Memory: Decreased recall of precautions (Requires education with function)              Exercises Other Exercises Other Exercises: Set up for toiletting    General Comments        Pertinent Vitals/Pain Pain Assessment: 0-10 Pain Score: 8  Pain Location: R hip Pain Descriptors / Indicators: Constant;Aching;Jabbing;Sore;Tightness Pain Intervention(s): Patient requesting pain meds-RN notified (unable to take anything more until 12:30)    Home Living  Prior Function            PT Goals (current goals can now be found in the care plan section) Progress towards PT goals: Progressing toward goals (slowly)    Frequency  BID    PT Plan Current plan remains appropriate    Co-evaluation             End of Session Equipment Utilized During Treatment: Gait  belt Activity Tolerance: Patient limited by pain Patient left: in chair;with call bell/phone within reach;with chair alarm set     Time: 1033-1100 PT Time Calculation (min) (ACUTE ONLY): 27 min  Charges:  $Gait Training: 23-37 mins                    G CodesKristeen Miss:      Heidi Elizabeth Bishop, PTA 07/10/2015, 11:05 AM

## 2015-07-10 NOTE — Plan of Care (Signed)
Dr. Albertha Gheeeturned page concerning low BP and re-confirmed high creatine.  No further orders at this time. Pt remained oriented and showing no s/sx of distress.  Med consult is being ordered.

## 2015-07-10 NOTE — Care Management (Signed)
Lovenox cancelled at [facility].

## 2015-07-10 NOTE — Progress Notes (Addendum)
   Subjective: 2 Days Post-Op Procedure(s) (LRB): TOTAL HIP ARTHROPLASTY ANTERIOR APPROACH (Right) Patient reports pain as 8 on 0-10 scale and moderate.   Patient is well, and has had no acute complaints or problems Denies any CP, SOB, ABD pain. We will continue therapy today.  Plan is to go Rehab after hospital stay.  Objective: Vital signs in last 24 hours: Temp:  [97.5 F (36.4 C)-100.6 F (38.1 C)] 97.5 F (36.4 C) (06/22 0514) Pulse Rate:  [88-94] 94 (06/22 0514) Resp:  [18] 18 (06/22 0514) BP: (100-158)/(62-84) 128/70 mmHg (06/22 0819) SpO2:  [97 %] 97 % (06/22 0514)  Intake/Output from previous day: 06/21 0701 - 06/22 0700 In: 290 [P.O.:290] Out: 300 [Urine:300] Intake/Output this shift:     Recent Labs  07/08/15 1605 07/09/15 0628 07/10/15 0636  HGB 11.5* 10.6* 10.6*    Recent Labs  07/09/15 0628 07/10/15 0636  WBC 10.8* 12.5*  RBC 3.56* 3.63*  HCT 30.0* 30.4*  PLT 162 173    Recent Labs  07/09/15 0628 07/10/15 0636  NA 134* 134*  K 3.4* 3.1*  CL 100* 97*  CO2 24 24  BUN 18 26*  CREATININE 0.89 1.28*  GLUCOSE 157* 191*  CALCIUM 8.6* 9.1   No results for input(s): LABPT, INR in the last 72 hours.  EXAM General - Patient is Alert, Appropriate and Oriented Extremity - Neurovascular intact Sensation intact distally Intact pulses distally Dorsiflexion/Plantar flexion intact No cellulitis present Dressing - dressing C/D/I and hemovac dc Motor Function - intact, moving foot and toes well on exam.   Past Medical History  Diagnosis Date  . Hypertension   . Kidney stones   . Plantar fascial fibromatosis   . Hyperlipidemia   . Diabetes mellitus without complication (HCC)   . GERD (gastroesophageal reflux disease)     Assessment/Plan:   2 Days Post-Op Procedure(s) (LRB): TOTAL HIP ARTHROPLASTY ANTERIOR APPROACH (Right) Active Problems:   Primary osteoarthritis of right hip  Estimated body mass index is 38.48 kg/(m^2) as calculated  from the following:   Height as of this encounter: 5\' 8"  (1.727 m).   Weight as of this encounter: 114.76 kg (253 lb). Advance diet Up with therapy  CM to assist with discharge. Plan on discharge to SNF Friday. Recheck labs in the am  Acute post op blood loss anemia- Hgb stable 10.6  Hypokalemia- Trending down to 3.1. Will increase klor kon to 40 meq TID, hold HCTZ and recheck K in the am.  Acute renal insufficiency- Cr slightly increased to 1.28. Patient is volume depleted, will give 1 bolus of NS and recheck Cr this pm.       DVT Prophylaxis - Lovenox, Foot Pumps and TED hose Weight-Bearing as tolerated to right leg   T. Cranston Neighborhris Gaines, PA-C [facility] 07/10/2015, 10:57 AM

## 2015-07-10 NOTE — Progress Notes (Signed)
Per PA patient will likely be ready tomorrow. Plan is for patient to D/C to [facility]. [employer] authorization has been received. Doug admissions coordinator at [facility] is aware of above. CSW will continue to follow and assist as needed.   Jetta LoutBailey Morgan, LCSW [phone]

## 2015-07-10 NOTE — Plan of Care (Signed)
Pt Creatine at 0630 labs was 1.28.  1000cc NS bolus given per orders.  Pt Creatine at 1300 labs was 2.33.  Lab called  To request repeat labs to confirm.  Dr. Geradine Girtontacted and agreed to re-do labs and contact with results. Lab informed to repeat labs.

## 2015-07-10 NOTE — Progress Notes (Signed)
Bedside report received from Matt RN. Care of pt assumed.  

## 2015-07-10 NOTE — Discharge Instructions (Signed)

## 2015-07-10 NOTE — Progress Notes (Signed)
Occupational Therapy Treatment Patient Details Name: Joshua Lee MRN: [id number] DOB: 02/07/1965 Today's Date: 07/10/2015    History of present illness 50 yo M with OA of R hip is s/p R anterior THR.    OT comments  Patient seen this date for OT treatment session with focus on self care tasks.  [facility], increased time and cues required.  He participated in adaptive equipment training for lower body dressing with use of reacher and sock aid with setup, min assist and cues.  Patient fatigued this pm and had to take rest breaks and complained of stomach pains at times but was unable to have a BM.  Will continue OT to increase independence in daily tasks.   Follow Up Recommendations  SNF    Equipment Recommendations  3 in 1 bedside comode    Recommendations for Other Services      Precautions / Restrictions Precautions Precautions: Fall;Anterior Hip Precaution Booklet Issued: No Restrictions Weight Bearing Restrictions: Yes RLE Weight Bearing: Weight bearing as tolerated       Mobility Bed Mobility               General bed mobility comments: Not tested; up in chair  Transfers Overall transfer level: Needs assistance Equipment used: Rolling walker (2 wheeled) Transfers: Sit to/from Stand Sit to Stand: Min assist Stand pivot transfers: Min assist       General transfer comment: increased time and cues for toilet transfer    Balance     Sitting balance-Leahy Scale: Good     Standing balance support: Bilateral upper extremity supported Standing balance-Leahy Scale: Fair                     ADL Overall ADL's : Needs assistance/impaired                     Lower Body Dressing: Set up;Minimal assistance;With adaptive equipment Lower Body Dressing Details (indicate cue type and reason): moderate cues provided.  Toilet Transfer: Minimal assistance   Toileting- ArchitectClothing Manipulation and Hygiene:  Minimal assistance                Vision                     Perception     Praxis      Cognition   Behavior During Therapy: WFL for tasks assessed/performed Overall Cognitive Status: Within Functional Limits for tasks assessed       Memory: Decreased recall of precautions               Extremity/Trunk Assessment               Exercises Total Joint Exercises Ankle Circles/Pumps: AROM;Both;20 reps (long sit) Quad Sets: Strengthening;Both;10 reps (long sit) Gluteal Sets: Strengthening;Both;10 reps;Supine Heel Slides: AAROM;Right;10 reps (long sit) Hip ABduction/ADduction: PROM;Right;10 reps (long sit, limited range) Long Arc Quad: Other (comment) (attempted; c/o too much pain) Other Exercises Other Exercises: Set up for toiletting   Shoulder Instructions       General Comments      Pertinent Vitals/ Pain       Pain Assessment: 0-10 Pain Score: 4  Pain Location: right hip Pain Descriptors / Indicators: Aching Pain Intervention(s): Limited activity within patient's tolerance;Monitored during session;Repositioned  Home Living Family/patient expects to be discharged to:: Private residence Living Arrangements: Alone Available Help at Discharge: Family [facility]  Access: Stairs to enter Enter[employer] CorporationEntrance Stairs-Number of Steps: 1 Entrance Stairs-Rails: None Home Layout: One level     Bathroom Shower/Tub: Walk-in shower;Door                    Prior Functioning/Environment              Frequency Min 1X/week     Progress Toward Goals  OT Goals(current goals can now be found in the care plan section)  Progress towards OT goals: Progressing toward goals  Acute Rehab OT Goals Patient Stated Goal: "be able to take care of myself and walk" OT Goal Formulation: With patient Time For Goal Achievement: 07/23/15 Potential to Achieve Goals: Good  Plan Discharge plan remains appropriate    Co-evaluation                  End of Session Equipment Utilized During Treatment: Gait belt   Activity Tolerance Patient limited by pain;Patient tolerated treatment well;Patient limited by fatigue   Patient Left in chair;with call bell/phone within reach;with chair alarm set   Nurse Communication          Time: [id number] OT Time Calculation (min): 35 min  Charges: OT General Charges $OT Visit: 1 Procedure OT Treatments $Self Care/Home Management : 23-37 mins  Maily Debarge  Jerrett Baldinger T Jaquesha Boroff, OTR/L, CLT  07/10/2015, 4:03 PM

## 2015-07-11 LAB — GLUCOSE, CAPILLARY
GLUCOSE-CAPILLARY: 265 mg/dL — AB (ref 65–99)
GLUCOSE-CAPILLARY: 199 mg/dL — AB (ref 65–99)

## 2015-07-11 LAB — BASIC METABOLIC PANEL
Anion gap: 13 (ref 5–15)
BUN: 38 mg/dL — ABNORMAL HIGH (ref 6–20)
CO2: 21 mmol/L — ABNORMAL LOW (ref 22–32)
Calcium: 8.6 mg/dL — ABNORMAL LOW (ref 8.9–10.3)
Chloride: 95 mmol/L — ABNORMAL LOW (ref 101–111)
Creatinine, Ser: 1.49 mg/dL — ABNORMAL HIGH (ref 0.61–1.24)
GFR calc Af Amer: >60 mL/min (ref >60)
GFR, EST NON AFRICAN AMERICAN: 53 mL/min — AB (ref >60)
Glucose, Bld: 182 mg/dL — ABNORMAL HIGH (ref 65–99)
POTASSIUM: 3.1 mmol/L — AB (ref 3.5–5.1)
SODIUM: 129 mmol/L — AB (ref 135–145)

## 2015-07-11 LAB — CBC
HCT: 28.6 % — ABNORMAL LOW (ref 40.0–52.0)
Hemoglobin: 10 g/dL — ABNORMAL LOW (ref 13.0–18.0)
MCH: 29.1 pg (ref 26.0–34.0)
MCHC: 34.9 g/dL (ref 32.0–36.0)
MCV: 83.5 fL (ref 80.0–100.0)
PLATELETS: 175 10*3/uL (ref 150–440)
RBC: 3.43 MIL/uL — AB (ref 4.40–5.90)
RDW: 14.6 % — ABNORMAL HIGH (ref 11.5–14.5)
WBC: 11.1 10*3/uL — AB (ref 3.8–10.6)

## 2015-07-11 LAB — MAGNESIUM: MAGNESIUM: 1.6 mg/dL — AB (ref 1.7–2.4)

## 2015-07-11 MED ORDER — ENOXAPARIN SODIUM 40 MG/0.4ML ~~LOC~~ SOLN: 40.0000 mg | SUBCUTANEOUS | Status: DC

## 2015-07-11 MED ORDER — POTASSIUM CHLORIDE 20 MEQ PO PACK
40.0000 meq | PACK | Freq: Three times a day (TID) | ORAL | Status: DC
  Administered 2015-07-11: 40 meq via ORAL
  Filled 2015-07-11: qty 2

## 2015-07-11 MED ORDER — OXYCODONE HCL 5 MG PO TABS: 5.0000 mg | ORAL_TABLET | ORAL | Status: DC | PRN

## 2015-07-11 NOTE — Progress Notes (Signed)
[facility] [place] [facility]

## 2015-07-11 NOTE — Progress Notes (Signed)
Occupational Therapy Treatment Patient Details Name: Joshua Lee MRN: [id number] DOB: 03/04/1965 Today's Date: 07/11/2015    History of present illness 50 yo M with OA of R hip is s/p R anterior THR.    OT comments  Pt. Has nausea today. Pt. Continues to present with limited functional mobility for ADLs, weakness, and decreased strength. Pt. continues to benefit from skilled OT services for ADL and A/E training, UE therapeutic ex, functional mobility during ADLs in order to improve ADL functioning and return to his PLOF.    Follow Up Recommendations  SNF    Equipment Recommendations  3 in 1 bedside comode    Recommendations for Other Services      Precautions / Restrictions Precautions Precautions: Fall Precaution Booklet Issued: No Restrictions Weight Bearing Restrictions: Yes RLE Weight Bearing: Weight bearing as tolerated       Mobility Bed Mobility Overal bed mobility: Needs Assistance Bed Mobility: Sit to Supine       Sit to supine: Mod assist              Balance                                   ADL Overall ADL's : Needs assistance/impaired Eating/Feeding: Set up   Grooming: Set up                                 General ADL Comments: Pt. education was provided about A/E use for LE dressing. Positioning, and home set-up/anticipated home set-up, and needs were reviewed with pt.      Vision                     Perception     Praxis      Cognition   Behavior During Therapy: WFL for tasks assessed/performed Overall Cognitive Status: Within Functional Limits for tasks assessed                       Extremity/Trunk Assessment               Exercises Total Joint Exercises Ankle Circles/Pumps: Strengthening;10 reps Quad Sets: Strengthening;15 reps Gluteal Sets: Strengthening;15 reps Heel Slides: AAROM;Right;10 reps Hip ABduction/ADduction: PROM;Right;10 reps   Shoulder Instructions        General Comments      Pertinent Vitals/ Pain       Pain Assessment: 0-10 Pain Score: 7  Pain Location: Right Hip  Home Living Family/patient expects to be discharged to:: Private residence Living Arrangements: Alone Available Help at Discharge: Family Type of Home: House                                  Prior Functioning/Environment              Frequency Min 1X/week     Progress Toward Goals  OT Goals(current goals can now be found in the care plan section)  Progress towards OT goals: Progressing toward goals     Plan Discharge plan remains appropriate    Co-evaluation                 End of Session     Activity Tolerance Patient tolerated treatment well   Patient Left in bed;with call  bell/phone within reach;with bed alarm set   Nurse Communication          Time: 1055-1110 OT Time Calculation (min): 15 min  Charges: OT General Charges $OT Visit: 1 Procedure OT Treatments $Self Care/Home Management : 8-22 mins   Joshua MessierElaine Lenay Lovejoy, MS, OTR/L  Joshua Lee 07/11/2015, 12:23 PM

## 2015-07-11 NOTE — Progress Notes (Addendum)
   Subjective: 3 Days Post-Op Procedure(s) (LRB): TOTAL HIP ARTHROPLASTY ANTERIOR APPROACH (Right) Patient reports pain as moderate.   Patient is well, and has had no acute complaints or problems Denies any CP, SOB, ABD pain. We will continue therapy today.  Plan is to go Rehab after hospital stay.  Objective: Vital signs in last 24 hours: Temp:  [97.8 F (36.6 C)-100 F (37.8 C)] 98.8 F (37.1 C) (06/23 0300) Pulse Rate:  [92-108] 108 (06/23 0300) Resp:  [18-20] 20 (06/23 0300) BP: (76-140)/(45-70) 140/70 mmHg (06/23 0300) SpO2:  [97 %-99 %] 99 % (06/23 0300)  Intake/Output from previous day: 06/22 0701 - 06/23 0700 In: 300 [P.O.:300] Out: 400 [Urine:400] Intake/Output this shift:     Recent Labs  07/08/15 1605 07/09/15 0628 07/10/15 0636 07/11/15 0536  HGB 11.5* 10.6* 10.6* 10.0*    Recent Labs  07/10/15 0636 07/11/15 0536  WBC 12.5* 11.1*  RBC 3.63* 3.43*  HCT 30.4* 28.6*  PLT 173 175    Recent Labs  07/10/15 1353 07/11/15 0536  NA 131* 129*  K 3.5 3.1*  CL 96* 95*  CO2 22 21*  BUN 32* 38*  CREATININE 2.33* 1.49*  GLUCOSE 157* 182*  CALCIUM 8.6* 8.6*   No results for input(s): LABPT, INR in the last 72 hours.  EXAM General - Patient is Alert, Appropriate and Oriented Extremity - Neurovascular intact Sensation intact distally Intact pulses distally Dorsiflexion/Plantar flexion intact No cellulitis present Dressing - dressing C/D/I and hemovac dc Motor Function - intact, moving foot and toes well on exam.   Past Medical History  Diagnosis Date  . Hypertension   . Kidney stones   . Plantar fascial fibromatosis   . Hyperlipidemia   . Diabetes mellitus without complication (HCC)   . GERD (gastroesophageal reflux disease)     Assessment/Plan:   3 Days Post-Op Procedure(s) (LRB): TOTAL HIP ARTHROPLASTY ANTERIOR APPROACH (Right) Active Problems:   Primary osteoarthritis of right hip  Estimated body mass index is 38.48 kg/(m^2) as  calculated from the following:   Height as of this encounter: 5\' 8"  (1.727 m).   Weight as of this encounter: 114.76 kg (253 lb). Advance diet Up with therapy  CM to assist with discharge. Plan on discharge to SNF Friday if medically cleared. [facility] for wound VAC removal and dressing change.  Acute post op blood loss anemia- Hgb stable 10.0  Hypokalemia- Trending down to 3.1. Continue with klor kon to 40 meq TID, hold HCTZ.   Acute renal insufficiency- Cr trending down with holding blood pressure medications and IV fluids.      DVT Prophylaxis - Lovenox, Foot Pumps and TED hose Weight-Bearing as tolerated to right leg   T. Cranston Neighborhris Gaines, PA-C [facility] 07/11/2015, 8:24 AM

## 2015-07-11 NOTE — Progress Notes (Signed)
[facility]  at [facility]   PATIENT NAME: Joshua RoeCharles Overbey    MR#:  [id number]  DATE OF BIRTH:  03/12/1965  SUBJECTIVE:  CHIEF COMPLAINT:  No chief complaint on file.  No complaint. REVIEW OF SYSTEMS:  CONSTITUTIONAL: No fever, fatigue or weakness.  EYES: No blurred or double vision.  EARS, NOSE, AND THROAT: No tinnitus or ear pain.  RESPIRATORY: No cough, shortness of breath, wheezing or hemoptysis.  CARDIOVASCULAR: No chest pain, orthopnea, edema.  GASTROINTESTINAL: No nausea, vomiting, diarrhea or abdominal pain.  GENITOURINARY: No dysuria, hematuria.  ENDOCRINE: No polyuria, nocturia,  HEMATOLOGY: No anemia, easy bruising or bleeding SKIN: No rash or lesion. MUSCULOSKELETAL: No joint pain or arthritis.   NEUROLOGIC: No tingling, numbness, weakness.  PSYCHIATRY: No anxiety or depression.   DRUG ALLERGIES:   Allergies  Allergen Reactions  . Penicillins Rash    Has patient had a PCN reaction causing immediate rash, facial/tongue/throat swelling, SOB or lightheadedness with hypotension: unknown Has patient had a PCN reaction causing severe rash involving mucus membranes or skin necrosis: unknown Has patient had a PCN reaction that required hospitalization: unknown Has patient had a PCN reaction occurring within the last 10 years: no If all of the above answers are "NO", then may proceed with Cephalosporin use.     VITALS:  Blood pressure 128/66, pulse 114, temperature 97.8 F (36.6 C), temperature source Oral, resp. rate 20, height 5\' 8"  (1.727 m), weight 253 lb (114.76 kg), SpO2 96 %.  PHYSICAL EXAMINATION:  GENERAL:  50 y.o.-year-old patient lying in the bed with no acute distress. obese. EYES: Pupils equal, round, reactive to light and accommodation. No scleral icterus. Extraocular muscles intact.  HEENT: Head atraumatic, normocephalic. Oropharynx and nasopharynx clear.  NECK:  Supple, no jugular venous distention. No thyroid  enlargement, no tenderness.  LUNGS: Normal breath sounds bilaterally, no wheezing, rales,rhonchi or crepitation. No use of accessory muscles of respiration.  CARDIOVASCULAR: S1, S2 normal. No murmurs, rubs, or gallops.  ABDOMEN: Soft, nontender, nondistended. Bowel sounds present. No organomegaly or mass.  EXTREMITIES: No pedal edema, cyanosis, or clubbing. Buttock and right hip in dressing. NEUROLOGIC: Cranial nerves II through XII are intact. Muscle strength 5/5 in all extremities. Sensation intact. Gait not checked.  PSYCHIATRIC: The patient is alert and oriented x 3.  SKIN: No obvious rash, lesion, or ulcer.    LABORATORY PANEL:   CBC  Recent Labs Lab 07/11/15 0536  WBC 11.1*  HGB 10.0*  HCT 28.6*  PLT 175   ------------------------------------------------------------------------------------------------------------------  Chemistries   Recent Labs Lab 07/11/15 0536  NA 129*  K 3.1*  CL 95*  CO2 21*  GLUCOSE 182*  BUN 38*  CREATININE 1.49*  CALCIUM 8.6*   ------------------------------------------------------------------------------------------------------------------  Cardiac Enzymes No results for input(s): TROPONINI in the last 168 hours. ------------------------------------------------------------------------------------------------------------------  RADIOLOGY:  No results found.  EKG:   Orders placed or performed during the hospital encounter of 04/12/15  . EKG 12-Lead  . EKG 12-Lead  . EKG    ASSESSMENT AND PLAN:   Hypotension. Improved with NS bolus, may resume Lopressor and lisinopril.  Acute renal failure. Improving with IV normal saline, follow-up BMP as outpatient.  Hyponatremia. F/u BMP as outpatient.  Hypokalemia. Given KCl po.  F/u BMP as outpatient.  Diabetes. on sliding scale.   Medically stable. D/C per surgeon. Sign off.   All the records are reviewed and case discussed with Care Management/Social Workerr. Management plans  discussed with the patient, family and they  are in agreement.  CODE STATUS: full code.  TOTAL TIME TAKING CARE OF THIS PATIENT:26 minutes.  Greater than 50% time was spent on coordination of care and face-to-face counseling.  POSSIBLE D/C today, DEPENDING ON CLINICAL CONDITION.   Shaune Pollackhen, Jairen Goldfarb M.D on 07/11/2015 at 10:36 AM  Between 7am to 6pm - Pager - [phone]  After 6pm go to [email or web address] - password [email or web address] [facility]  [facility]  Office  [phone]  CC: Primary care physician; Vonita MossMark Crissman, MD

## 2015-07-11 NOTE — Clinical Social Work Placement (Signed)
   CLINICAL SOCIAL WORK PLACEMENT  NOTE  Date:  07/11/2015  Patient Details  Name: Joshua Lee MRN: [id number] Date of Birth: 11/16/1965  Clinical Social Work is seeking post-discharge placement for this patient at the Skilled  Nursing Facility level of care (*CSW will initial, date and re-position this form in  chart as items are completed):  Yes   Patient/family provided with [facility] Clinical Social Work Department's list of facilities offering this level of care within the geographic area requested by the patient (or if unable, by the patient's family).  Yes   Patient/family informed of their freedom to choose among providers that offer the needed level of care, that participate in Medicare, Medicaid or managed care program needed by the patient, have an available bed and are willing to accept the patient.  Yes   Patient/family informed of [facility]'s ownership interest in [facility] and [facility], as well as of the fact that they are under no obligation to receive care at these facilities.  PASRR submitted to EDS on 07/09/15     PASRR number received on 07/09/15     Existing PASRR number confirmed on       FL2 transmitted to all facilities in geographic area requested by pt/family on 07/09/15     FL2 transmitted to all facilities within larger geographic area on       Patient informed that his/her managed care company has contracts with or will negotiate with certain facilities, including the following:        Yes   Patient/family informed of bed offers received.  Patient chooses bed at  [facility] )     Physician recommends and patient chooses bed at      Patient to be transferred to  [facility] ) on 07/11/15.  Patient to be transferred to facility by  [facility] )     Patient family notified on 07/11/15 of transfer.  Name of family member notified:   (CSW left patient's mother Estrellita LudwigYvone a voicemail making her aware of above. )      PHYSICIAN       Additional Comment:    _______________________________________________ Haig ProphetMorgan, Nyshaun Standage G, LCSW 07/11/2015, 11:21 AM

## 2015-07-11 NOTE — Progress Notes (Signed)
EMS here to transport pt. 

## 2015-07-11 NOTE — Progress Notes (Signed)
Physical Therapy Treatment Patient Details Name: Primus BravoCharles T Billing MRN: [id number] DOB: 11/05/1965 Today's Date: 07/11/2015    History of Present Illness 50 yo M with OA of R hip is s/p R anterior THR.     PT Comments    Pt continues to struggle with generalized hesitancy and pain with most acts, but was able to increase ambulation distance and show decent effort with exercises.  He is unable to rise from 20" surface w/o significant assist and overall is slow and hesitant with most mobility acts.  Pt c/o L hip pain from sitting in recliner nearly as much as R hip surgical pain.    Follow Up Recommendations  SNF     Equipment Recommendations       Recommendations for Other Services       Precautions / Restrictions Precautions Precautions: Fall;Anterior Hip Restrictions RLE Weight Bearing: Weight bearing as tolerated    Mobility  Bed Mobility Overal bed mobility: Needs Assistance Bed Mobility: Sit to Supine       Sit to supine: Mod assist   General bed mobility comments: Pt in recliner on arrival, concerned about having trouble getting up from bed but secondary to L hip soreness from being in chair   Transfers Overall transfer level: Needs assistance Equipment used: Rolling walker (2 wheeled) Transfers: Sit to/from Stand Sit to Stand: Mod assist         General transfer comment: Pt has a difficult time getting up from a lower seat level and when he attempted to rise w/o assist was unable to elevate off surface even a fraction of an inch.  Pt needing considerable assist to rise.  Ambulation/Gait Ambulation/Gait assistance: Min guard Ambulation Distance (Feet): 100 Feet Assistive device: Rolling walker (2 wheeled)       General Gait Details: Pt initially was able to take only small, hesitant steps with c/o signficant b/l hip pain.  After cuing and much encouragement pt was able to increase speed and confidence but overall is still hesitant and guarded with  ambulation   Stairs            Wheelchair Mobility    Modified Rankin (Stroke Patients Only)       Balance                                    Cognition Arousal/Alertness: Awake/alert Behavior During Therapy: WFL for tasks assessed/performed Overall Cognitive Status: Within Functional Limits for tasks assessed                      Exercises Total Joint Exercises Ankle Circles/Pumps: Strengthening;10 reps Quad Sets: Strengthening;15 reps Gluteal Sets: Strengthening;15 reps Heel Slides: AAROM;Right;10 reps Hip ABduction/ADduction: PROM;Right;10 reps    General Comments        Pertinent Vitals/Pain Pain Score: 7  Pain Location: R hip, pt also c/o lateral L hip pain    Home Living                      Prior Function            PT Goals (current goals can now be found in the care plan section) Progress towards PT goals: Progressing toward goals    Frequency  BID    PT Plan Current plan remains appropriate    Co-evaluation  End of Session Equipment Utilized During Treatment: Gait belt Activity Tolerance: Patient limited by pain Patient left: with bed alarm set;with call bell/phone within reach     Time: [id number] PT Time Calculation (min) (ACUTE ONLY): 40 min  Charges:  $Gait Training: 8-22 mins $Therapeutic Exercise: 23-37 mins                    G Codes:      Malachi ProGalen R Inioluwa Boulay, DPT 07/11/2015, 10:11 AM

## 2015-07-11 NOTE — Discharge Summary (Signed)
Physician Discharge Summary  Patient ID: Primus BravoCharles T Sennett MRN: [id number] DOB/AGE: 50/08/1965 50 y.o.  Admit date: 07/08/2015 Discharge date: 07/11/2015  Admission Diagnoses:  RIGHT OSTEOARTHRITIS HIP   Discharge Diagnoses: Patient Active Problem List   Diagnosis Date Noted  . Primary osteoarthritis of right hip 07/08/2015  . Plantar fasciitis 04/17/2015  . Osteoarthritis of both feet 03/05/2015  . Kidney stones 12/02/2014  . Hypertension 07/02/2014  . Hyperlipidemia 07/02/2014  . Diabetes mellitus without complication (HCC) 07/02/2014    Past Medical History  Diagnosis Date  . Hypertension   . Kidney stones   . Plantar fascial fibromatosis   . Hyperlipidemia   . Diabetes mellitus without complication (HCC)   . GERD (gastroesophageal reflux disease)      Transfusion: none   Consultants (if any): Treatment Team:  Shaune PollackQing Chen, MD  Discharged Condition: Improved  Hospital Course: Primus BravoCharles T Granada is an 50 y.o. male who was admitted 07/08/2015 with a diagnosis of right hip osteoarthritis and went to the operating room on 07/08/2015 and underwent the above named procedures.    Surgeries: Procedure(s): TOTAL HIP ARTHROPLASTY ANTERIOR APPROACH on 07/08/2015 Patient tolerated the surgery well. Taken to PACU where she was stabilized and then transferred to the orthopedic floor.  Started on Lovenox 40 q 24 hrs. Foot pumps applied bilaterally at 80 mm. Heels elevated on bed with rolled towels. No evidence of DVT. Negative Homan. Physical therapy started on day #1 for gait training and transfer. OT started day #1 for ADL and assisted devices.  Patient's foley was d/c on day #1.Paient was hypokalemic on post op day 1, K was given. Patient's IV and hemovac was d/c on day #2. On post op day 2 patient potassium was decreased, kidney function decreased and patient was found to be volume depleted. His BP medications were held and he was given bolus of NS, Cr continued to trend up and BP  dropped. Medicine was consulted, BP medications were discontinued and he was given more IV hydration.   On post op day #3, potassium was low at 3.1, oral potassium was continued. BP and Creatine had improved. BP medications were held.  Patient was stable and ready for discharge to SNF.   Implants: Medacta AMIS 3 stem collared, 56 mpact cup DM with liner and S 28 mm head  He was given perioperative antibiotics:  Anti-infectives    Start     Dose/Rate Route Frequency Ordered Stop   07/08/15 1530  vancomycin (VANCOCIN) IVPB 1000 mg/200 mL premix     1,000 mg 200 mL/hr over 60 Minutes Intravenous Every 12 hours 07/08/15 1520 07/09/15 0446   07/08/15 0748  clindamycin (CLEOCIN) 900 MG/50ML IVPB    Comments:  Hallaji, Violet Ann: cabinet override      07/08/15 0748 07/08/15 1105   07/08/15 0100  vancomycin (VANCOCIN) IVPB 1000 mg/200 mL premix     1,000 mg 200 mL/hr over 60 Minutes Intravenous  Once 07/08/15 0056 07/08/15 1019   07/08/15 0100  clindamycin (CLEOCIN) IVPB 900 mg  Status:  Discontinued     900 mg 100 mL/hr over 30 Minutes Intravenous  Once 07/08/15 0056 07/08/15 1403    .  He was given sequential compression devices, early ambulation, and lovenox for DVT prophylaxis.  He benefited maximally from the hospital stay and there were no complications.    Recent vital signs:  Filed Vitals:   07/11/15 0300 07/11/15 0821  BP: 140/70 128/66  Pulse: 108 114  Temp: 98.8 F (37.1  C) 97.8 F (36.6 C)  Resp: 20 20    Recent laboratory studies:  Lab Results  Component Value Date   HGB 10.0* 07/11/2015   HGB 10.6* 07/10/2015   HGB 10.6* 07/09/2015   Lab Results  Component Value Date   WBC 11.1* 07/11/2015   PLT 175 07/11/2015   Lab Results  Component Value Date   INR 1.04 07/02/2015   Lab Results  Component Value Date   NA 129* 07/11/2015   K 3.1* 07/11/2015   CL 95* 07/11/2015   CO2 21* 07/11/2015   BUN 38* 07/11/2015   CREATININE 1.49* 07/11/2015   GLUCOSE 182*  07/11/2015    Discharge Medications:     Medication List    STOP taking these medications        HYDROcodone-acetaminophen 5-325 MG tablet  Commonly known as:  NORCO     NAPROXEN SODIUM PO     traMADol 50 MG tablet  Commonly known as:  ULTRAM      TAKE these medications        acetaminophen 500 MG tablet  Commonly known as:  TYLENOL  Take 500-1,500 mg by mouth every 8 (eight) hours as needed (pain).     acetaminophen 650 MG CR tablet  Commonly known as:  TYLENOL  Take 650-1,950 mg by mouth every 8 (eight) hours as needed for pain.     acetaminophen-codeine 300-30 MG tablet  Commonly known as:  TYLENOL #3  Take 1 tablet by mouth every 6 (six) hours as needed for moderate pain.     aspirin 81 MG tablet  Take 81 mg by mouth daily.     atorvastatin 80 MG tablet  Commonly known as:  LIPITOR  Take 80 mg by mouth at bedtime.     benazepril 40 MG tablet  Commonly known as:  LOTENSIN  Take 1 tablet (40 mg total) by mouth daily.     calcium carbonate 750 MG chewable tablet  Commonly known as:  TUMS EX  Chew 2 tablets by mouth daily as needed for heartburn.     CAMPHO-PHENIQUE EX  Apply 1 application topically as needed.     cetirizine 10 MG tablet  Commonly known as:  ZYRTEC  Take 10 mg by mouth daily as needed for allergies.     Coenzyme Q-10 100 MG capsule  Take 100 mg by mouth daily.     dextromethorphan-guaiFENesin 30-600 MG 12hr tablet  Commonly known as:  MUCINEX DM  Take 1 tablet by mouth 2 (two) times daily as needed for cough.     enoxaparin 40 MG/0.4ML injection  Commonly known as:  LOVENOX  Inject 0.4 mLs (40 mg total) into the skin daily.     FISH OIL PO  Take 1 capsule by mouth daily.     Garlic 1000 MG Caps  Take 1,000 mg by mouth daily.     hydrochlorothiazide 12.5 MG capsule  Commonly known as:  MICROZIDE  Take 1 capsule (12.5 mg total) by mouth daily.     ADVIL 200 MG Caps  Generic drug:  Ibuprofen  Take 200 mg by mouth as needed.      ibuprofen 800 MG tablet  Commonly known as:  ADVIL,MOTRIN  Take 1 tablet (800 mg total) by mouth every 8 (eight) hours as needed.     ketoconazole 2 % cream  Commonly known as:  NIZORAL  Apply 1 application topically daily. Applies to groin for yeast infection.     lidocaine 5 %  ointment  Commonly known as:  XYLOCAINE  Apply 1 application topically 2 (two) times daily as needed. For foot pain     LOTRIMIN AF 2 % Aero  Generic drug:  Miconazole Nitrate  Apply 1 application topically daily as needed.     metFORMIN 500 MG tablet  Commonly known as:  GLUCOPHAGE  Take 1 tablet (500 mg total) by mouth 2 times daily at 12 noon and 4 pm.     metoprolol 100 MG tablet  Commonly known as:  LOPRESSOR  Take 1 tablet (100 mg total) by mouth 2 (two) times daily.     NEOSPORIN EX  Apply 1 application topically as needed.     NONFORMULARY OR COMPOUNDED ITEM  [facility]:  Combo Pain #1 - Aripiprazole 0.5%, Baclofen 3%, Gabapentin 8%, Ketorolac 8%, Lidocaine 5%, dispense 240 grams, apply 1-2 grams to affected area 3-4 times a day, refill prn.     ONE TOUCH ULTRA TEST test strip  Generic drug:  glucose blood  TEST once daily as directed     ONETOUCH DELICA LANCETS FINE Misc  TEST once daily as directed     oxyCODONE 5 MG immediate release tablet  Commonly known as:  Oxy IR/ROXICODONE  Take 1-2 tablets (5-10 mg total) by mouth every 3 (three) hours as needed for breakthrough pain.     Potassium Citrate 15 MEQ (1620 MG) Tbcr  Take 15 mEq by mouth at bedtime.     tamsulosin 0.4 MG Caps capsule  Commonly known as:  FLOMAX  Take 1 capsule (0.4 mg total) by mouth daily.     tolnaftate 1 % spray  Commonly known as:  TINACTIN  Apply 1 application topically as needed.     Turmeric Curcumin Caps  Take 1 capsule by mouth daily.        Diagnostic Studies: Dg Foot Complete Left  06/14/2015  CLINICAL DATA:  Left foot pain for 3 weeks. EXAM: LEFT FOOT - COMPLETE 3+ VIEW COMPARISON:   None. FINDINGS: There is no evidence of fracture or dislocation. There is no evidence of arthropathy or other focal bone abnormality. Soft tissues are unremarkable. IMPRESSION: No acute osseous injury of the left foot. Electronically Signed   By: Elige Ko   On: 06/14/2015 15:58   Dg Hip Operative Unilat W Or W/o Pelvis Right  07/08/2015  CLINICAL DATA:  Intraoperative images from right hip arthroplasty. EXAM: OPERATIVE RIGHT HIP (WITH PELVIS IF PERFORMED)  VIEWS TECHNIQUE: Fluoroscopic spot image(s) were submitted for interpretation post-operatively. COMPARISON:  None. FINDINGS: Three fluoroscopic intraoperative images from right hip arthroplasty are presented for interpretation. The last 2 images demonstrate placement of long stem femoral component and glued in acetabular component total arthroplasty of the right hip. The orthopedic hardware is in anatomic alignment. No evidence of fracture. IMPRESSION: Intraoperative images from right hip arthroplasty without evidence of immediate complications. Electronically Signed   By: Ted Mcalpine M.D.   On: 07/08/2015 12:50   Dg Hip Unilat W Or W/o Pelvis 2-3 Views Right  07/08/2015  CLINICAL DATA:  Post right total hip replacement EXAM: DG HIP (WITH OR WITHOUT PELVIS) 2-3V RIGHT COMPARISON:  None. FINDINGS: Two views of the right hip submitted. There is right hip prosthesis with anatomic alignment. Postsurgical drain in place. Postsurgical changes with adjacent skin staples. IMPRESSION: Right hip prosthesis with anatomic alignment. Electronically Signed   By: Natasha Mead M.D.   On: 07/08/2015 13:36    Disposition: 01-Home or Self Care  Follow-up Information    Follow up with [facility] .   Specialty:  Skilled Nursing Facility   Contact information:   [address] [place] [address] [phone]      Follow up with MENZ,MICHAEL, MD On 07/15/2015.   Specialty:  Orthopedic Surgery   Why:   for wound vac removal and application of new dressing   Contact information:   [address] [facility] [place] [address] [phone]        Signed: [facility] 07/11/2015, 9:06 AM

## 2015-07-11 NOTE — Progress Notes (Signed)
Patient is medically stable for D/C to [facility]. [facility] patient will go to room 408 and BCBS authorization has been received. Clinical Child psychotherapistocial Worker (CSW) sent D/C Summary, FL2 and [facility] Packet [facility]. Patient is aware of above. CSW attempted to contact patient's mother Estrellita LudwigYvone however she did not answer and a voicemail was left. Please reconsult if future social work needs arise. CSW signing off.   Jetta LoutBailey Morgan, LCSW [phone]

## 2015-07-11 NOTE — Progress Notes (Signed)
EMS called for transportation.  

## 2015-07-28 ENCOUNTER — Telehealth: Payer: Self-pay | Admitting: *Deleted

## 2015-07-28 NOTE — Telephone Encounter (Signed)
Pt states he had hip surgery and has swelling in his feet and he didn't know if he should keep his appt tomorrow.  I spoke with pt to encourage him to discuss the swelling in his feet with the doctor that performed his hip surgery to see if related.  Pt states the hip surgeon said it was related and should decrease over time, and he was going to keep his appt tomorrow and just get his toenails cut.

## 2015-07-29 ENCOUNTER — Encounter: Payer: Self-pay | Admitting: Podiatry

## 2015-07-29 ENCOUNTER — Ambulatory Visit (INDEPENDENT_AMBULATORY_CARE_PROVIDER_SITE_OTHER): Payer: BLUE CROSS/BLUE SHIELD | Admitting: Podiatry

## 2015-07-29 VITALS — BP 132/68 | HR 88 | Resp 12

## 2015-07-29 DIAGNOSIS — B351 Tinea unguium: Secondary | ICD-10-CM | POA: Diagnosis not present

## 2015-07-29 DIAGNOSIS — M79674 Pain in right toe(s): Secondary | ICD-10-CM | POA: Diagnosis not present

## 2015-07-29 DIAGNOSIS — M79675 Pain in left toe(s): Secondary | ICD-10-CM | POA: Diagnosis not present

## 2015-08-01 ENCOUNTER — Encounter: Payer: Self-pay | Admitting: Podiatry

## 2015-08-01 NOTE — Progress Notes (Signed)
Patient ID: Joshua Lee, male   DOB: 05/13/1965, 50 y.o.   MRN: [id number]  Subjective: 50 y.o. returns the office today for painful, elongated, thickened toenails which he cannot trim himself. Denies any redness or drainage around the nails. His left foot pain has resolved. He recently just underwent right hip replacement. Denies any pain to his feet otherwise at this time. Denies any acute changes since last appointment and no new complaints today. Denies any systemic complaints such as fevers, chills, nausea, vomiting.   Objective: AAO 3, NAD DP/PT pulses palpable, CRT less than 3 seconds There is mild swelling to the right foot however there is no erythema or increase in warmth. There is no swelling to the calf or pain to the calf. Nails hypertrophic, dystrophic, elongated, brittle, discolored 10. There is tenderness overlying the nails 1-5 bilaterally. There is no surrounding erythema or drainage along the nail sites. No open lesions or pre-ulcerative lesions are identified. No other areas of tenderness bilateral lower extremities. No overlying edema, erythema, increased warmth. No pain with calf compression, swelling, warmth, erythema.  Assessment: Patient presents with symptomatic onychomycosis  Plan: -Treatment options including alternatives, risks, complications were discussed -Nails sharply debrided 10 without complication/bleeding. -Discussed daily foot inspection. If there are any changes, to call the office immediately.  -Follow-up in 3 months or sooner if any problems are to arise. In the meantime, encouraged to call the office with any questions, concerns, changes symptoms.  Ovid CurdMatthew Wagoner, DPM

## 2015-08-15 ENCOUNTER — Other Ambulatory Visit: Payer: Self-pay | Admitting: Podiatry

## 2015-08-15 NOTE — Telephone Encounter (Signed)
Pt needs an appt prior to future refills. 

## 2015-08-21 ENCOUNTER — Other Ambulatory Visit: Payer: Self-pay | Admitting: Family Medicine

## 2015-08-21 ENCOUNTER — Other Ambulatory Visit: Payer: Self-pay | Admitting: Unknown Physician Specialty

## 2015-08-21 NOTE — Telephone Encounter (Signed)
Your patient 

## 2015-08-21 NOTE — Telephone Encounter (Signed)
Pt called to request a refill on OneTouch Delica Lancets Fine called into to [facility]

## 2015-09-01 ENCOUNTER — Telehealth: Payer: Self-pay

## 2015-09-04 ENCOUNTER — Encounter: Payer: Self-pay | Admitting: Family Medicine

## 2015-09-04 ENCOUNTER — Ambulatory Visit (INDEPENDENT_AMBULATORY_CARE_PROVIDER_SITE_OTHER): Payer: BLUE CROSS/BLUE SHIELD | Admitting: Family Medicine

## 2015-09-04 VITALS — BP 111/71 | HR 82 | Temp 97.9°F | Ht 69.7 in | Wt 258.0 lb

## 2015-09-04 DIAGNOSIS — I10 Essential (primary) hypertension: Secondary | ICD-10-CM | POA: Diagnosis not present

## 2015-09-04 DIAGNOSIS — E785 Hyperlipidemia, unspecified: Secondary | ICD-10-CM | POA: Diagnosis not present

## 2015-09-04 DIAGNOSIS — M1611 Unilateral primary osteoarthritis, right hip: Secondary | ICD-10-CM

## 2015-09-04 DIAGNOSIS — E119 Type 2 diabetes mellitus without complications: Secondary | ICD-10-CM

## 2015-09-04 LAB — HEMOGLOBIN A1C
HEMOGLOBIN A1C: 6.9
Hemoglobin A1C: 6.9

## 2015-09-04 MED ORDER — EMPAGLIFLOZIN 25 MG PO TABS: 25.0000 mg | ORAL_TABLET | Freq: Every day | ORAL | 6 refills | Status: DC

## 2015-09-04 NOTE — Assessment & Plan Note (Signed)
Discussed diet nutrition exercise will stop the insulin start Jardiance patient education given on yeast infection and management

## 2015-09-04 NOTE — Assessment & Plan Note (Signed)
The current medical regimen is effective;  continue present plan and medications.  

## 2015-09-04 NOTE — Progress Notes (Signed)
BP 111/71 (BP Location: Left Arm, Patient Position: Sitting, Cuff Size: Normal)   Pulse 82   Temp 97.9 F (36.6 C)   Ht 5' 9.7" (1.77 m) Comment: with shoes  Wt 258 lb (117 kg) Comment: with shoes  SpO2 99%   BMI 37.34 kg/m    Subjective:    Patient ID: Joshua Lee, male    DOB: 12/28/1965, 50 y.o.   MRN: [id number]  HPI: Joshua BravoCharles T Rhyner is a 50 y.o. male  Chief Complaint  Patient presents with  . Diabetes  . patient wondering about kidney function   Patient all in all doing well was placed on insulin in the hospital is wondering if he needs it is never been on insulin for blood sugars been doing well. Patient now out of the hospital recovered with no problems reviewed notes had blood sugar elevations there which are reflected an elevated A1C today. Otherwise doing well with medications no side effects taken faithfully good control Relevant past medical, surgical, family and social history reviewed and updated as indicated. Interim medical history since our last visit reviewed. Allergies and medications reviewed and updated.  Review of Systems  Constitutional: Negative.   Cardiovascular: Negative.     Per HPI unless specifically indicated above     Objective:    BP 111/71 (BP Location: Left Arm, Patient Position: Sitting, Cuff Size: Normal)   Pulse 82   Temp 97.9 F (36.6 C)   Ht 5' 9.7" (1.77 m) Comment: with shoes  Wt 258 lb (117 kg) Comment: with shoes  SpO2 99%   BMI 37.34 kg/m   Wt Readings from Last 3 Encounters:  09/04/15 258 lb (117 kg)  07/08/15 253 lb (114.8 kg)  07/02/15 253 lb (114.8 kg)    Physical Exam  Constitutional: He is oriented to person, place, and time. He appears well-developed and well-nourished. No distress.  HENT:  Head: Normocephalic and atraumatic.  Right Ear: Hearing normal.  Left Ear: Hearing normal.  Nose: Nose normal.  Eyes: Conjunctivae and lids are normal. Right eye exhibits no discharge. Left eye exhibits no  discharge. No scleral icterus.  Cardiovascular: Normal rate, regular rhythm and normal heart sounds.   Pulmonary/Chest: Effort normal and breath sounds normal. No respiratory distress.  Musculoskeletal: Normal range of motion.  Neurological: He is alert and oriented to person, place, and time.  Skin: Skin is intact. No rash noted.  Psychiatric: He has a normal mood and affect. His speech is normal and behavior is normal. Judgment and thought content normal. Cognition and memory are normal.    Results for orders placed or performed in visit on 09/04/15  Hemoglobin A1c  Result Value Ref Range   Hemoglobin A1C 6.9       Assessment & Plan:   Problem List Items Addressed This Visit      Cardiovascular and Mediastinum   Hypertension    The current medical regimen is effective;  continue present plan and medications.         Endocrine   Diabetes mellitus without complication (HCC) - Primary    Discussed diet nutrition exercise will stop the insulin start Jardiance patient education given on yeast infection and management      Relevant Medications   empagliflozin (JARDIANCE) 25 MG TABS tablet   Other Relevant Orders   Bayer DCA Hb A1c Waived     Musculoskeletal and Integument   Primary osteoarthritis of right hip    Recovering well from surgery  Other   Hyperlipidemia    The current medical regimen is effective;  continue present plan and medications.        Other Visit Diagnoses   None.      Follow up plan: Return in about 3 months (around 12/05/2015) for Physical Exam, Hemoglobin A1c.

## 2015-09-04 NOTE — Assessment & Plan Note (Signed)
Recovering well from surgery 

## 2015-09-05 ENCOUNTER — Other Ambulatory Visit: Payer: Self-pay

## 2015-09-05 LAB — BAYER DCA HB A1C WAIVED: HB A1C (BAYER DCA - WAIVED): 6.9 % (ref <7.0)

## 2015-09-16 NOTE — Telephone Encounter (Signed)
error 

## 2015-10-16 ENCOUNTER — Other Ambulatory Visit: Payer: Self-pay | Admitting: Podiatry

## 2015-10-16 NOTE — Telephone Encounter (Signed)
Pt needs an appt prior to future refills. 

## 2015-10-27 ENCOUNTER — Ambulatory Visit (INDEPENDENT_AMBULATORY_CARE_PROVIDER_SITE_OTHER): Payer: BLUE CROSS/BLUE SHIELD

## 2015-10-27 DIAGNOSIS — Z23 Encounter for immunization: Secondary | ICD-10-CM | POA: Diagnosis not present

## 2015-11-01 ENCOUNTER — Other Ambulatory Visit: Payer: Self-pay | Admitting: Family Medicine

## 2015-11-13 ENCOUNTER — Other Ambulatory Visit: Payer: Self-pay | Admitting: Podiatry

## 2015-11-13 ENCOUNTER — Other Ambulatory Visit: Payer: Self-pay | Admitting: Family Medicine

## 2015-11-13 NOTE — Telephone Encounter (Signed)
Routing to provider  

## 2015-12-08 ENCOUNTER — Other Ambulatory Visit: Payer: Self-pay | Admitting: Family Medicine

## 2015-12-08 NOTE — Telephone Encounter (Signed)
Routing to provider, appt on 02/10/16

## 2015-12-09 ENCOUNTER — Other Ambulatory Visit: Payer: Self-pay | Admitting: Family Medicine

## 2015-12-09 NOTE — Telephone Encounter (Signed)
Pt would like a order for test strips for his one touch ultra 2 meter to be sent to [facility] [address]

## 2015-12-09 NOTE — Telephone Encounter (Signed)
Order written, will fax

## 2015-12-14 ENCOUNTER — Other Ambulatory Visit: Payer: Self-pay | Admitting: Family Medicine

## 2015-12-15 NOTE — Telephone Encounter (Signed)
Routing to provider, appt on 01/21/16.

## 2015-12-25 ENCOUNTER — Telehealth: Payer: Self-pay | Admitting: Family Medicine

## 2015-12-25 NOTE — Telephone Encounter (Signed)
ok 

## 2015-12-25 NOTE — Telephone Encounter (Signed)
Patient states that his insurance company no longer covers supplies for his OneTouch Ultra Glucose Meter.  Pt states that his insurance company has forced him to change to a  Contour Next EZ meter.  Pt is requesting that an RX be called in for Contour Next test strips and needles.    [facility] [address]

## 2015-12-25 NOTE — Telephone Encounter (Signed)
Need ok from Dr. Dossie Arbourrissman.

## 2015-12-26 NOTE — Telephone Encounter (Signed)
RX form filled out, signed by Dr. Laural BenesJohnson as Dr. Dossie Arbourrissman is out of the office, and faxed to the pharmacy.

## 2015-12-29 ENCOUNTER — Telehealth: Payer: Self-pay | Admitting: Family Medicine

## 2015-12-29 ENCOUNTER — Telehealth: Payer: Self-pay

## 2015-12-29 NOTE — Telephone Encounter (Signed)
It was faxed to [facility] on Friday.    Patient said pharmacy hasn't received anything.  It was faxed again and patient was notified.

## 2015-12-29 NOTE — Telephone Encounter (Signed)
See other phone note. Patient called in and stated that the pharmacy did not receive the rx we faxed on Friday for his test strips and lancets. I resent the rx to the pharmacy and then called and confirmed with them that they did get it today.

## 2015-12-30 ENCOUNTER — Other Ambulatory Visit: Payer: Self-pay | Admitting: Family Medicine

## 2016-01-01 ENCOUNTER — Encounter: Payer: BLUE CROSS/BLUE SHIELD | Admitting: Family Medicine

## 2016-01-20 ENCOUNTER — Other Ambulatory Visit: Payer: Self-pay

## 2016-01-21 ENCOUNTER — Encounter: Payer: BLUE CROSS/BLUE SHIELD | Admitting: Family Medicine

## 2016-01-25 ENCOUNTER — Other Ambulatory Visit: Payer: Self-pay | Admitting: Family Medicine

## 2016-01-28 ENCOUNTER — Ambulatory Visit (INDEPENDENT_AMBULATORY_CARE_PROVIDER_SITE_OTHER): Payer: BLUE CROSS/BLUE SHIELD | Admitting: Family Medicine

## 2016-01-28 ENCOUNTER — Other Ambulatory Visit: Payer: Self-pay | Admitting: Family Medicine

## 2016-01-28 ENCOUNTER — Encounter: Payer: Self-pay | Admitting: Family Medicine

## 2016-01-28 VITALS — BP 115/73 | HR 71 | Ht 68.0 in | Wt 259.0 lb

## 2016-01-28 DIAGNOSIS — Z1211 Encounter for screening for malignant neoplasm of colon: Secondary | ICD-10-CM | POA: Diagnosis not present

## 2016-01-28 DIAGNOSIS — E782 Mixed hyperlipidemia: Secondary | ICD-10-CM | POA: Diagnosis not present

## 2016-01-28 DIAGNOSIS — I10 Essential (primary) hypertension: Secondary | ICD-10-CM | POA: Diagnosis not present

## 2016-01-28 DIAGNOSIS — Z125 Encounter for screening for malignant neoplasm of prostate: Secondary | ICD-10-CM | POA: Diagnosis not present

## 2016-01-28 DIAGNOSIS — Z0000 Encounter for general adult medical examination without abnormal findings: Secondary | ICD-10-CM

## 2016-01-28 DIAGNOSIS — Z114 Encounter for screening for human immunodeficiency virus [HIV]: Secondary | ICD-10-CM | POA: Diagnosis not present

## 2016-01-28 DIAGNOSIS — E119 Type 2 diabetes mellitus without complications: Secondary | ICD-10-CM

## 2016-01-28 DIAGNOSIS — Z23 Encounter for immunization: Secondary | ICD-10-CM | POA: Diagnosis not present

## 2016-01-28 LAB — URINALYSIS, ROUTINE W REFLEX MICROSCOPIC
BILIRUBIN UA: NEGATIVE
Ketones, UA: NEGATIVE
LEUKOCYTES UA: NEGATIVE
Nitrite, UA: NEGATIVE
PH UA: 5 (ref 5.0–7.5)
PROTEIN UA: NEGATIVE
SPEC GRAV UA: 1.02 (ref 1.005–1.030)
Urobilinogen, Ur: 0.2 mg/dL (ref 0.2–1.0)

## 2016-01-28 LAB — MICROSCOPIC EXAMINATION
BACTERIA UA: NONE SEEN
EPITHELIAL CELLS (NON RENAL): NONE SEEN /HPF (ref 0–10)

## 2016-01-28 LAB — BAYER DCA HB A1C WAIVED: HB A1C: 7.3 % — AB (ref <7.0)

## 2016-01-28 MED ORDER — BAYER CONTOUR NEXT TEST VI STRP: ORAL_STRIP | 12 refills | Status: DC

## 2016-01-28 MED ORDER — HYDROCHLOROTHIAZIDE 12.5 MG PO CAPS: 12.5000 mg | ORAL_CAPSULE | Freq: Every day | ORAL | 12 refills | Status: DC

## 2016-01-28 MED ORDER — TAMSULOSIN HCL 0.4 MG PO CAPS: 0.4000 mg | ORAL_CAPSULE | Freq: Every day | ORAL | 12 refills | Status: DC

## 2016-01-28 MED ORDER — BAYER MICROLET LANCETS MISC: 1.0000 | 0 refills | Status: DC

## 2016-01-28 MED ORDER — EMPAGLIFLOZIN 25 MG PO TABS: 25.0000 mg | ORAL_TABLET | Freq: Every day | ORAL | 12 refills | Status: DC

## 2016-01-28 MED ORDER — METFORMIN HCL 500 MG PO TABS: 500.0000 mg | ORAL_TABLET | Freq: Two times a day (BID) | ORAL | 12 refills | Status: DC

## 2016-01-28 MED ORDER — METOPROLOL TARTRATE 100 MG PO TABS: 100.0000 mg | ORAL_TABLET | Freq: Two times a day (BID) | ORAL | 12 refills | Status: DC

## 2016-01-28 MED ORDER — BENAZEPRIL HCL 40 MG PO TABS: 40.0000 mg | ORAL_TABLET | Freq: Every day | ORAL | 12 refills | Status: DC

## 2016-01-28 MED ORDER — ATORVASTATIN CALCIUM 80 MG PO TABS: 80.0000 mg | ORAL_TABLET | Freq: Every day | ORAL | 12 refills | Status: DC

## 2016-01-28 NOTE — Telephone Encounter (Signed)
Patient needs contour next easy test strips and lancets instead of one touch since his insurance will not cover it anymore.  Thank you Clydie BraunKaren

## 2016-01-28 NOTE — Progress Notes (Signed)
BP 115/73   Pulse 71   Ht 5\' 8"  (1.727 m)   Wt 259 lb (117.5 kg)   SpO2 98%   BMI 39.38 kg/m    Subjective:    Patient ID: Joshua Lee, male    DOB: 04/13/1965, 51 y.o.   MRN: [id number]  HPI: Joshua Lee is a 51 y.o. male  Chief Complaint  Patient presents with  . Annual Exam    colonoscopy ordered.  . Diabetes  Diabetes discuss patient still drinking a lot of sweet tea and gaining weight hemoglobin A1c of 7.3 which is increased. Taking medications faithfully. Blood pressure cholesterol doing okay no complaints from medications taken faithfully. Relevant past medical, surgical, family and social history reviewed and updated as indicated. Interim medical history since our last visit reviewed. Allergies and medications reviewed and updated.  Review of Systems  Constitutional: Negative.   HENT: Negative.   Eyes: Negative.   Respiratory: Negative.   Cardiovascular: Negative.   Gastrointestinal: Negative.   Endocrine: Negative.   Genitourinary: Negative.   Musculoskeletal: Negative.   Skin: Negative.   Allergic/Immunologic: Negative.   Neurological: Negative.   Hematological: Negative.   Psychiatric/Behavioral: Negative.     Per HPI unless specifically indicated above     Objective:    BP 115/73   Pulse 71   Ht 5\' 8"  (1.727 m)   Wt 259 lb (117.5 kg)   SpO2 98%   BMI 39.38 kg/m   Wt Readings from Last 3 Encounters:  01/28/16 259 lb (117.5 kg)  09/04/15 258 lb (117 kg)  07/08/15 253 lb (114.8 kg)    Physical Exam  Constitutional: He is oriented to person, place, and time. He appears well-developed and well-nourished.  HENT:  Head: Normocephalic and atraumatic.  Right Ear: External ear normal.  Left Ear: External ear normal.  Eyes: Conjunctivae and EOM are normal. Pupils are equal, round, and reactive to light.  Neck: Normal range of motion. Neck supple.  Cardiovascular: Normal rate, regular rhythm, normal heart sounds and intact distal pulses.    Pulmonary/Chest: Effort normal and breath sounds normal.  Abdominal: Soft. Bowel sounds are normal. There is no splenomegaly or hepatomegaly.  Genitourinary: Rectum normal, prostate normal and penis normal.  Musculoskeletal: Normal range of motion.  Neurological: He is alert and oriented to person, place, and time. He has normal reflexes.  Skin: No rash noted. No erythema.  Psychiatric: He has a normal mood and affect. His behavior is normal. Judgment and thought content normal.    Results for orders placed or performed in visit on 09/05/15  Hemoglobin A1c  Result Value Ref Range   Hemoglobin A1C 6.9       Assessment & Plan:   Problem List Items Addressed This Visit      Cardiovascular and Mediastinum   Hypertension    The current medical regimen is effective;  continue present plan and medications.       Relevant Medications   metoprolol (LOPRESSOR) 100 MG tablet   hydrochlorothiazide (MICROZIDE) 12.5 MG capsule   empagliflozin (JARDIANCE) 25 MG TABS tablet   benazepril (LOTENSIN) 40 MG tablet   atorvastatin (LIPITOR) 80 MG tablet   Other Relevant Orders   CBC with Differential/Platelet   Comprehensive metabolic panel   Urinalysis, Routine w reflex microscopic     Endocrine   Diabetes mellitus without complication (HCC) - Primary    Worsening control with weight gain discuss weight loss diet exercise nutrition for better control.  Relevant Medications   metFORMIN (GLUCOPHAGE) 500 MG tablet   empagliflozin (JARDIANCE) 25 MG TABS tablet   benazepril (LOTENSIN) 40 MG tablet   atorvastatin (LIPITOR) 80 MG tablet   Other Relevant Orders   Bayer DCA Hb A1c Waived (STAT)   TSH   Urinalysis, Routine w reflex microscopic     Other   Hyperlipidemia    The current medical regimen is effective;  continue present plan and medications.       Relevant Medications   metoprolol (LOPRESSOR) 100 MG tablet   hydrochlorothiazide (MICROZIDE) 12.5 MG capsule   benazepril  (LOTENSIN) 40 MG tablet   atorvastatin (LIPITOR) 80 MG tablet   Other Relevant Orders   Lipid Panel w/o Chol/HDL Ratio    Other Visit Diagnoses    Screening for HIV (human immunodeficiency virus)       Relevant Orders   HIV antibody (with reflex)   Annual physical exam       Relevant Orders   CBC with Differential/Platelet   Comprehensive metabolic panel   Lipid Panel w/o Chol/HDL Ratio   PSA   TSH   Urinalysis, Routine w reflex microscopic   Screening PSA (prostate specific antigen)       Relevant Orders   PSA   Colon cancer screening       Relevant Orders   Ambulatory referral to Gastroenterology   Need for pneumococcal vaccination       Relevant Orders   Pneumococcal polysaccharide vaccine 23-valent greater than or equal to 2yo subcutaneous/IM (Completed)       Follow up plan: Return for Hemoglobin A1c.

## 2016-01-28 NOTE — Assessment & Plan Note (Signed)
The current medical regimen is effective;  continue present plan and medications.  

## 2016-01-28 NOTE — Telephone Encounter (Signed)
Patient needs new rx for test strips and lancets.

## 2016-01-28 NOTE — Assessment & Plan Note (Signed)
Worsening control with weight gain discuss weight loss diet exercise nutrition for better control.

## 2016-01-29 ENCOUNTER — Encounter: Payer: Self-pay | Admitting: Family Medicine

## 2016-01-29 LAB — COMPREHENSIVE METABOLIC PANEL
ALT: 24 IU/L (ref 0–44)
AST: 19 IU/L (ref 0–40)
Albumin/Globulin Ratio: 1.7 (ref 1.2–2.2)
Albumin: 4.5 g/dL (ref 3.5–5.5)
Alkaline Phosphatase: 52 IU/L (ref 39–117)
BILIRUBIN TOTAL: 0.3 mg/dL (ref 0.0–1.2)
BUN/Creatinine Ratio: 15 (ref 9–20)
BUN: 18 mg/dL (ref 6–24)
CALCIUM: 9.5 mg/dL (ref 8.7–10.2)
CHLORIDE: 97 mmol/L (ref 96–106)
CO2: 24 mmol/L (ref 18–29)
Creatinine, Ser: 1.18 mg/dL (ref 0.76–1.27)
GFR calc Af Amer: 83 mL/min/{1.73_m2} (ref >59)
GFR calc non Af Amer: 72 mL/min/{1.73_m2} (ref >59)
Globulin, Total: 2.6 g/dL (ref 1.5–4.5)
Glucose: 138 mg/dL — ABNORMAL HIGH (ref 65–99)
POTASSIUM: 4.5 mmol/L (ref 3.5–5.2)
Sodium: 140 mmol/L (ref 134–144)
Total Protein: 7.1 g/dL (ref 6.0–8.5)

## 2016-01-29 LAB — CBC WITH DIFFERENTIAL/PLATELET
BASOS: 0 %
Basophils Absolute: 0 10*3/uL (ref 0.0–0.2)
EOS (ABSOLUTE): 0.1 10*3/uL (ref 0.0–0.4)
Eos: 2 %
Hematocrit: 39.7 % (ref 37.5–51.0)
Hemoglobin: 12.5 g/dL — ABNORMAL LOW (ref 13.0–17.7)
IMMATURE GRANULOCYTES: 0 %
Immature Grans (Abs): 0 10*3/uL (ref 0.0–0.1)
Lymphocytes Absolute: 1.6 10*3/uL (ref 0.7–3.1)
Lymphs: 18 %
MCH: 26 pg — ABNORMAL LOW (ref 26.6–33.0)
MCHC: 31.5 g/dL (ref 31.5–35.7)
MCV: 83 fL (ref 79–97)
MONOS ABS: 0.6 10*3/uL (ref 0.1–0.9)
Monocytes: 7 %
NEUTROS PCT: 73 %
Neutrophils Absolute: 6.4 10*3/uL (ref 1.4–7.0)
PLATELETS: 221 10*3/uL (ref 150–379)
RBC: 4.81 x10E6/uL (ref 4.14–5.80)
RDW: 15.6 % — AB (ref 12.3–15.4)
WBC: 8.8 10*3/uL (ref 3.4–10.8)

## 2016-01-29 LAB — PSA: Prostate Specific Ag, Serum: 0.5 ng/mL (ref 0.0–4.0)

## 2016-01-29 LAB — TSH: TSH: 2.19 u[IU]/mL (ref 0.450–4.500)

## 2016-01-29 LAB — LIPID PANEL W/O CHOL/HDL RATIO
CHOLESTEROL TOTAL: 128 mg/dL (ref 100–199)
HDL: 25 mg/dL — ABNORMAL LOW (ref >39)
TRIGLYCERIDES: 425 mg/dL — AB (ref 0–149)

## 2016-01-29 LAB — HIV ANTIBODY (ROUTINE TESTING W REFLEX): HIV SCREEN 4TH GENERATION: NONREACTIVE

## 2016-02-10 ENCOUNTER — Encounter: Payer: BLUE CROSS/BLUE SHIELD | Admitting: Family Medicine

## 2016-03-16 DIAGNOSIS — N182 Chronic kidney disease, stage 2 (mild): Secondary | ICD-10-CM | POA: Insufficient documentation

## 2016-04-27 ENCOUNTER — Ambulatory Visit: Payer: BLUE CROSS/BLUE SHIELD | Admitting: Family Medicine

## 2016-05-03 ENCOUNTER — Encounter: Payer: Self-pay | Admitting: Family Medicine

## 2016-05-03 ENCOUNTER — Ambulatory Visit (INDEPENDENT_AMBULATORY_CARE_PROVIDER_SITE_OTHER): Payer: BLUE CROSS/BLUE SHIELD | Admitting: Family Medicine

## 2016-05-03 VITALS — BP 127/73 | HR 80 | Ht 68.0 in | Wt 259.0 lb

## 2016-05-03 DIAGNOSIS — I10 Essential (primary) hypertension: Secondary | ICD-10-CM

## 2016-05-03 DIAGNOSIS — E119 Type 2 diabetes mellitus without complications: Secondary | ICD-10-CM

## 2016-05-03 LAB — BAYER DCA HB A1C WAIVED: HB A1C (BAYER DCA - WAIVED): 7.7 % — ABNORMAL HIGH (ref <7.0)

## 2016-05-03 MED ORDER — METFORMIN HCL 500 MG PO TABS
1000.0000 mg | ORAL_TABLET | Freq: Two times a day (BID) | ORAL | 10 refills | Status: DC
Start: 2016-05-03 — End: 2017-02-09

## 2016-05-03 NOTE — Assessment & Plan Note (Signed)
The current medical regimen is effective;  continue present plan and medications.  

## 2016-05-03 NOTE — Progress Notes (Signed)
BP 127/73   Pulse 80   Ht  (1.727 m)   Wt 259 lb (117.5 kg)   SpO2 98%   BMI 39.38 kg/m    Subjective:    Patient ID: Joshua Lee, male    DOB: 08/12/1965, 51 y.o.   MRN: [id number]  HPI: Joshua Lee is a 51 y.o. male  Chief Complaint  Patient presents with  . Follow-up  . Diabetes  . Sore Throat    x 2 days.    The patient's had a little congestion drainage hasn't really felt that sick but hadn't felt real good either has tried some Zyrtec and maybe helped. Diabetes not sure how is can be doing hasn't been able to lose any weight hasn't been checking blood sugar. Other medications reviewed and stable without problems Blood pressures been doing good with no problems with medications.  Not taking tramadol and still has some from his bottle from November When was taking tramadol is taken because his feet and back. These of been relatively stable.  Patient does mention having had knee surgery with a catch in his hip and seems to get a catch in his hip and knee area hasn't been back to surgeon to have that further evaluated encourage patient to do so.  Relevant past medical, surgical, family and social history reviewed and updated as indicated. Interim medical history since our last visit reviewed. Allergies and medications reviewed and updated.  Review of Systems  Constitutional: Negative.  Negative for fatigue and fever.  HENT: Positive for congestion, rhinorrhea and sore throat. Negative for sinus pain, sinus pressure and sneezing.   Respiratory: Negative.   Cardiovascular: Negative.     Per HPI unless specifically indicated above     Objective:    BP 127/73   Pulse 80   Ht  (1.727 m)   Wt 259 lb (117.5 kg)   SpO2 98%   BMI 39.38 kg/m   Wt Readings from Last 3 Encounters:  05/03/16 259 lb (117.5 kg)  01/28/16 259 lb (117.5 kg)  09/04/15 258 lb (117 kg)    Physical Exam  Constitutional: He is oriented to person, place, and time. He  appears well-developed and well-nourished.  HENT:  Head: Normocephalic and atraumatic.  Right Ear: External ear normal.  Left Ear: External ear normal.  Mouth/Throat: Oropharynx is clear and moist.  Eyes: Conjunctivae and EOM are normal.  Neck: Normal range of motion.  Cardiovascular: Normal rate, regular rhythm and normal heart sounds.   Pulmonary/Chest: Effort normal and breath sounds normal.  Musculoskeletal: Normal range of motion.  Lymphadenopathy:    He has no cervical adenopathy.  Neurological: He is alert and oriented to person, place, and time.  Skin: No erythema.  Psychiatric: He has a normal mood and affect. His behavior is normal. Judgment and thought content normal.    Results for orders placed or performed in visit on 01/28/16  Microscopic Examination  Result Value Ref Range   WBC, UA 0-5 0 - 5 /hpf   RBC, UA 0-2 0 - 2 /hpf   Epithelial Cells (non renal) None seen 0 - 10 /hpf   Bacteria, UA None seen None seen/Few  Bayer DCA Hb A1c Waived (STAT)  Result Value Ref Range   Bayer DCA Hb A1c Waived 7.3 (H) <7.0 %  HIV antibody (with reflex)  Result Value Ref Range   HIV Screen 4th Generation wRfx Non Reactive Non Reactive  CBC with Differential/Platelet  Result Value  Ref Range   WBC 8.8 3.4 - 10.8 x10E3/uL   RBC 4.81 4.14 - 5.80 x10E6/uL   Hemoglobin 12.5 (L) 13.0 - 17.7 g/dL   Hematocrit 16.1 09.6 - 51.0 %   MCV 83 79 - 97 fL   MCH 26.0 (L) 26.6 - 33.0 pg   MCHC 31.5 31.5 - 35.7 g/dL   RDW 04.5 (H) 40.9 - 81.1 %   Platelets 221 150 - 379 x10E3/uL   Neutrophils 73 Not Estab. %   Lymphs 18 Not Estab. %   Monocytes 7 Not Estab. %   Eos 2 Not Estab. %   Basos 0 Not Estab. %   Neutrophils Absolute 6.4 1.4 - 7.0 x10E3/uL   Lymphocytes Absolute 1.6 0.7 - 3.1 x10E3/uL   Monocytes Absolute 0.6 0.1 - 0.9 x10E3/uL   EOS (ABSOLUTE) 0.1 0.0 - 0.4 x10E3/uL   Basophils Absolute 0.0 0.0 - 0.2 x10E3/uL   Immature Granulocytes 0 Not Estab. %   Immature Grans (Abs) 0.0  0.0 - 0.1 x10E3/uL  Comprehensive metabolic panel  Result Value Ref Range   Glucose 138 (H) 65 - 99 mg/dL   BUN 18 6 - 24 mg/dL   Creatinine, Ser 9.14 0.76 - 1.27 mg/dL   GFR calc non Af Amer 72 >59 mL/min/1.73   GFR calc Af Amer 83 >59 mL/min/1.73   BUN/Creatinine Ratio 15 9 - 20   Sodium 140 134 - 144 mmol/L   Potassium 4.5 3.5 - 5.2 mmol/L   Chloride 97 96 - 106 mmol/L   CO2 24 18 - 29 mmol/L   Calcium 9.5 8.7 - 10.2 mg/dL   Total Protein 7.1 6.0 - 8.5 g/dL   Albumin 4.5 3.5 - 5.5 g/dL   Globulin, Total 2.6 1.5 - 4.5 g/dL   Albumin/Globulin Ratio 1.7 1.2 - 2.2   Bilirubin Total 0.3 0.0 - 1.2 mg/dL   Alkaline Phosphatase 52 39 - 117 IU/L   AST 19 0 - 40 IU/L   ALT 24 0 - 44 IU/L  Lipid Panel w/o Chol/HDL Ratio  Result Value Ref Range   Cholesterol, Total 128 100 - 199 mg/dL   Triglycerides 782 (H) 0 - 149 mg/dL   HDL 25 (L) >95 mg/dL   VLDL Cholesterol Cal Comment 5 - 40 mg/dL   LDL Calculated Comment 0 - 99 mg/dL  PSA  Result Value Ref Range   Prostate Specific Ag, Serum 0.5 0.0 - 4.0 ng/mL  TSH  Result Value Ref Range   TSH 2.190 0.450 - 4.500 uIU/mL  Urinalysis, Routine w reflex microscopic  Result Value Ref Range   Specific Gravity, UA 1.020 1.005 - 1.030   pH, UA 5.0 5.0 - 7.5   Color, UA Yellow Yellow   Appearance Ur Clear Clear   Leukocytes, UA Negative Negative   Protein, UA Negative Negative/Trace   Glucose, UA 3+ (A) Negative   Ketones, UA Negative Negative   RBC, UA 2+ (A) Negative   Bilirubin, UA Negative Negative   Urobilinogen, Ur 0.2 0.2 - 1.0 mg/dL   Nitrite, UA Negative Negative   Microscopic Examination See below:       Assessment & Plan:   Problem List Items Addressed This Visit      Cardiovascular and Mediastinum   Hypertension    The current medical regimen is effective;  continue present plan and medications.         Endocrine   Diabetes mellitus without complication (HCC) - Primary   Relevant Medications  metFORMIN  (GLUCOPHAGE) 500 MG tablet   Other Relevant Orders   Bayer DCA Hb A1c Waived       Follow up plan: Return in about 3 months (around 08/02/2016) for BMP,  Lipids, ALT, AST, Hemoglobin A1c.

## 2016-06-28 ENCOUNTER — Encounter: Payer: Self-pay | Admitting: Family Medicine

## 2016-08-04 ENCOUNTER — Ambulatory Visit: Payer: BLUE CROSS/BLUE SHIELD | Admitting: Family Medicine

## 2016-08-10 ENCOUNTER — Ambulatory Visit (INDEPENDENT_AMBULATORY_CARE_PROVIDER_SITE_OTHER): Payer: BLUE CROSS/BLUE SHIELD | Admitting: Family Medicine

## 2016-08-10 ENCOUNTER — Encounter: Payer: Self-pay | Admitting: Family Medicine

## 2016-08-10 VITALS — BP 102/67 | HR 76 | Wt 258.0 lb

## 2016-08-10 DIAGNOSIS — E119 Type 2 diabetes mellitus without complications: Secondary | ICD-10-CM

## 2016-08-10 DIAGNOSIS — I10 Essential (primary) hypertension: Secondary | ICD-10-CM | POA: Diagnosis not present

## 2016-08-10 DIAGNOSIS — E782 Mixed hyperlipidemia: Secondary | ICD-10-CM

## 2016-08-10 NOTE — Assessment & Plan Note (Signed)
Discuss elevated cholesterol lipids triglycerides nonfasting patient will do better with diet exercise nutrition

## 2016-08-10 NOTE — Assessment & Plan Note (Signed)
The current medical regimen is effective;  continue present plan and medications.  

## 2016-08-10 NOTE — Progress Notes (Signed)
   BP 102/67   Pulse 76   Wt 258 lb (117 kg)   SpO2 98%   BMI 39.23 kg/m    Subjective:    Patient ID: Joshua Lee, male    DOB: 08/25/1965, 51 y.o.   MRN: [id number]  HPI: Joshua BravoCharles T Dilks is a 51 y.o. male  Follow-up diabetesAnd hypertension, hypercholesterol, and diabetes. Patient all in all doing well biggest concern is some dry skin around his ankles for which I recommended Vaseline after his bath No low blood sugar spells no issues with medications. Blood pressure good control no low blood pressure spells or concerns. Cholesterol also doing well with no complaints.  Relevant past medical, surgical, family and social history reviewed and updated as indicated. Interim medical history since our last visit reviewed. Allergies and medications reviewed and updated.  Review of Systems  Constitutional: Negative.   Respiratory: Negative.   Cardiovascular: Negative.     Per HPI unless specifically indicated above     Objective:    BP 102/67   Pulse 76   Wt 258 lb (117 kg)   SpO2 98%   BMI 39.23 kg/m   Wt Readings from Last 3 Encounters:  08/10/16 258 lb (117 kg)  05/03/16 259 lb (117.5 kg)  01/28/16 259 lb (117.5 kg)    Physical Exam  Constitutional: He is oriented to person, place, and time. He appears well-developed and well-nourished.  HENT:  Head: Normocephalic and atraumatic.  Eyes: Conjunctivae and EOM are normal.  Neck: Normal range of motion.  Cardiovascular: Normal rate, regular rhythm and normal heart sounds.   Pulmonary/Chest: Effort normal and breath sounds normal.  Musculoskeletal: Normal range of motion.  Neurological: He is alert and oriented to person, place, and time.  Skin: No erythema.  Psychiatric: He has a normal mood and affect. His behavior is normal. Judgment and thought content normal.    Results for orders placed or performed in visit on 05/03/16  Bayer DCA Hb A1c Waived  Result Value Ref Range   Bayer DCA Hb A1c Waived 7.7 (H)  <7.0 %      Assessment & Plan:   Problem List Items Addressed This Visit      Cardiovascular and Mediastinum   Hypertension - Primary    The current medical regimen is effective;  continue present plan and medications.       Relevant Orders   Basic metabolic panel   LP+ALT+AST Piccolo, Waived   Bayer DCA Hb A1c Waived     Endocrine   Diabetes mellitus without complication (HCC)    Marked improvement will continue current therapy      Relevant Orders   Basic metabolic panel   LP+ALT+AST Piccolo, Waived   Bayer DCA Hb A1c Waived     Other   Hyperlipidemia    Discuss elevated cholesterol lipids triglycerides nonfasting patient will do better with diet exercise nutrition      Relevant Orders   Basic metabolic panel   LP+ALT+AST Piccolo, Waived   Bayer DCA Hb A1c Waived       Follow up plan: Return in about 3 months (around 11/10/2016) for Hemoglobin A1c.

## 2016-08-10 NOTE — Assessment & Plan Note (Signed)
Marked improvement will continue current therapy

## 2016-08-11 ENCOUNTER — Encounter: Payer: Self-pay | Admitting: Family Medicine

## 2016-08-11 LAB — BASIC METABOLIC PANEL
BUN/Creatinine Ratio: 13 (ref 9–20)
BUN: 15 mg/dL (ref 6–24)
CALCIUM: 9.5 mg/dL (ref 8.7–10.2)
CO2: 19 mmol/L — ABNORMAL LOW (ref 20–29)
CREATININE: 1.18 mg/dL (ref 0.76–1.27)
Chloride: 97 mmol/L (ref 96–106)
GFR, EST AFRICAN AMERICAN: 83 mL/min/{1.73_m2} (ref >59)
GFR, EST NON AFRICAN AMERICAN: 72 mL/min/{1.73_m2} (ref >59)
Glucose: 270 mg/dL — ABNORMAL HIGH (ref 65–99)
POTASSIUM: 4.2 mmol/L (ref 3.5–5.2)
Sodium: 138 mmol/L (ref 134–144)

## 2016-08-12 LAB — BAYER DCA HB A1C WAIVED: HB A1C (BAYER DCA - WAIVED): 7.1 % — ABNORMAL HIGH (ref <7.0)

## 2016-08-12 LAB — LP+ALT+AST PICCOLO, WAIVED
ALT (SGPT) PICCOLO, WAIVED: 38 U/L (ref 10–47)
AST (SGOT) Piccolo, Waived: 33 U/L (ref 11–38)
CHOLESTEROL PICCOLO, WAIVED: 135 mg/dL (ref <200)
Triglycerides Piccolo,Waived: 463 mg/dL — ABNORMAL HIGH (ref <150)

## 2016-11-10 ENCOUNTER — Ambulatory Visit (INDEPENDENT_AMBULATORY_CARE_PROVIDER_SITE_OTHER): Payer: BLUE CROSS/BLUE SHIELD | Admitting: Family Medicine

## 2016-11-10 ENCOUNTER — Encounter: Payer: Self-pay | Admitting: Family Medicine

## 2016-11-10 VITALS — BP 111/71 | HR 85 | Wt 266.0 lb

## 2016-11-10 DIAGNOSIS — Z23 Encounter for immunization: Secondary | ICD-10-CM

## 2016-11-10 DIAGNOSIS — E119 Type 2 diabetes mellitus without complications: Secondary | ICD-10-CM

## 2016-11-10 DIAGNOSIS — E782 Mixed hyperlipidemia: Secondary | ICD-10-CM

## 2016-11-10 DIAGNOSIS — I10 Essential (primary) hypertension: Secondary | ICD-10-CM | POA: Diagnosis not present

## 2016-11-10 LAB — BAYER DCA HB A1C WAIVED: HB A1C (BAYER DCA - WAIVED): 7.7 % — ABNORMAL HIGH (ref <7.0)

## 2016-11-10 NOTE — Assessment & Plan Note (Signed)
The current medical regimen is effective;  continue present plan and medications.  

## 2016-11-10 NOTE — Assessment & Plan Note (Addendum)
Discuss poor control patient will do better with diet nutrition weight loss

## 2016-11-10 NOTE — Progress Notes (Signed)
BP 111/71   Pulse 85   Wt 266 lb (120.7 kg)   SpO2 99%   BMI 40.45 kg/m    Subjective:    Patient ID: Joshua Lee, male    DOB: 12/23/1965, 51 y.o.   MRN: [id number]  HPI: Joshua Lee is a 51 y.o. male  Follow-up diabetes hypertension medications. Doing well no complaints taking medications faithfully without problems. Noted low blood sugar spells or issues with diabetes and blood pressure cholesterol. Patient is complaining of some red itchy burning spots on his legs possibly from fire ants. Has been using some witch hazel on that.  Relevant past medical, surgical, family and social history reviewed and updated as indicated. Interim medical history since our last visit reviewed. Allergies and medications reviewed and updated.  Review of Systems  Constitutional: Negative.   Respiratory: Negative.   Cardiovascular: Negative.     Per HPI unless specifically indicated above     Objective:    BP 111/71   Pulse 85   Wt 266 lb (120.7 kg)   SpO2 99%   BMI 40.45 kg/m   Wt Readings from Last 3 Encounters:  11/10/16 266 lb (120.7 kg)  08/10/16 258 lb (117 kg)  05/03/16 259 lb (117.5 kg)    Physical Exam  Constitutional: He is oriented to person, place, and time. He appears well-developed and well-nourished.  HENT:  Head: Normocephalic and atraumatic.  Eyes: Conjunctivae and EOM are normal.  Neck: Normal range of motion.  Cardiovascular: Normal rate, regular rhythm and normal heart sounds.   Pulmonary/Chest: Effort normal and breath sounds normal.  Musculoskeletal: Normal range of motion.  Neurological: He is alert and oriented to person, place, and time.  Skin: No erythema.  Psychiatric: He has a normal mood and affect. His behavior is normal. Judgment and thought content normal.    Results for orders placed or performed in visit on 08/10/16  Basic metabolic panel  Result Value Ref Range   Glucose 270 (H) 65 - 99 mg/dL   BUN 15 6 - 24 mg/dL   Creatinine, Ser 2.841.18 0.76 - 1.27 mg/dL   GFR calc non Af Amer 72 >59 mL/min/1.73   GFR calc Af Amer 83 >59 mL/min/1.73   BUN/Creatinine Ratio 13 9 - 20   Sodium 138 134 - 144 mmol/L   Potassium 4.2 3.5 - 5.2 mmol/L   Chloride 97 96 - 106 mmol/L   CO2 19 (L) 20 - 29 mmol/L   Calcium 9.5 8.7 - 10.2 mg/dL  LP+ALT+AST Piccolo, Waived  Result Value Ref Range   ALT (SGPT) Piccolo, Waived 38 10 - 47 U/L   AST (SGOT) Piccolo, Waived 33 11 - 38 U/L   Cholesterol Piccolo, Waived 135 <200 mg/dL   HDL Chol Piccolo, Waived CANCELED    Triglycerides Piccolo,Waived 463 (H) <150 mg/dL  Bayer DCA Hb X3KA1c Waived  Result Value Ref Range   Bayer DCA Hb A1c Waived 7.1 (H) <7.0 %      Assessment & Plan:   Problem List Items Addressed This Visit      Cardiovascular and Mediastinum   Hypertension - Primary    The current medical regimen is effective;  continue present plan and medications.       Relevant Orders   Bayer DCA Hb A1c Waived     Endocrine   Diabetes mellitus without complication (HCC)    Discuss poor control patient will do better with diet nutrition weight loss  Relevant Orders   Bayer DCA Hb A1c Waived     Other   Hyperlipidemia    The current medical regimen is effective;  continue present plan and medications.       Relevant Orders   Bayer DCA Hb A1c Waived    Other Visit Diagnoses    Needs flu shot       Relevant Orders   Flu Vaccine QUAD 36+ mos IM (Completed)       Follow up plan: Return in about 3 months (around 02/10/2017) for Physical Exam, Hemoglobin A1c.

## 2017-01-28 ENCOUNTER — Telehealth: Payer: Self-pay | Admitting: Family Medicine

## 2017-01-28 DIAGNOSIS — I10 Essential (primary) hypertension: Secondary | ICD-10-CM

## 2017-01-28 MED ORDER — METOPROLOL TARTRATE 100 MG PO TABS: 100.0000 mg | ORAL_TABLET | Freq: Two times a day (BID) | ORAL | 12 refills | Status: DC

## 2017-01-28 NOTE — Telephone Encounter (Signed)
Metoprolol OV: 11/10/16   LR:01/28/16 #[id number] RF

## 2017-01-28 NOTE — Telephone Encounter (Signed)
[facility] called in to request a refill on pt's metoprolol  She said that they received a message requesting an electronic request, she said that they don't have the ability to send request electronically.    [facility]acy: [facility] [id number] [place] - [id number]

## 2017-02-01 ENCOUNTER — Other Ambulatory Visit: Payer: Self-pay | Admitting: Family Medicine

## 2017-02-01 ENCOUNTER — Telehealth: Payer: Self-pay | Admitting: Family Medicine

## 2017-02-01 DIAGNOSIS — I10 Essential (primary) hypertension: Secondary | ICD-10-CM

## 2017-02-01 MED ORDER — EMPAGLIFLOZIN 25 MG PO TABS: 25.0000 mg | ORAL_TABLET | Freq: Every day | ORAL | 12 refills | Status: DC

## 2017-02-01 NOTE — Telephone Encounter (Signed)
Patient would like a refill for Jardiance 25 MG TABS.  Patient states that his RX has expired. [facility] RX to [facility] [address].

## 2017-02-09 ENCOUNTER — Encounter: Payer: Self-pay | Admitting: Family Medicine

## 2017-02-09 ENCOUNTER — Ambulatory Visit (INDEPENDENT_AMBULATORY_CARE_PROVIDER_SITE_OTHER): Payer: BLUE CROSS/BLUE SHIELD | Admitting: Family Medicine

## 2017-02-09 VITALS — BP 110/70 | HR 83 | Ht 69.29 in | Wt 266.0 lb

## 2017-02-09 DIAGNOSIS — I10 Essential (primary) hypertension: Secondary | ICD-10-CM | POA: Diagnosis not present

## 2017-02-09 DIAGNOSIS — Z0001 Encounter for general adult medical examination with abnormal findings: Secondary | ICD-10-CM | POA: Diagnosis not present

## 2017-02-09 DIAGNOSIS — Z1329 Encounter for screening for other suspected endocrine disorder: Secondary | ICD-10-CM

## 2017-02-09 DIAGNOSIS — E782 Mixed hyperlipidemia: Secondary | ICD-10-CM | POA: Diagnosis not present

## 2017-02-09 DIAGNOSIS — Z125 Encounter for screening for malignant neoplasm of prostate: Secondary | ICD-10-CM | POA: Diagnosis not present

## 2017-02-09 DIAGNOSIS — E119 Type 2 diabetes mellitus without complications: Secondary | ICD-10-CM | POA: Diagnosis not present

## 2017-02-09 LAB — URINALYSIS, ROUTINE W REFLEX MICROSCOPIC
Bilirubin, UA: NEGATIVE
Ketones, UA: NEGATIVE
Leukocytes, UA: NEGATIVE
NITRITE UA: NEGATIVE
Protein, UA: NEGATIVE
Specific Gravity, UA: <1.005 — ABNORMAL LOW (ref 1.005–1.030)
Urobilinogen, Ur: 0.2 mg/dL (ref 0.2–1.0)
pH, UA: 5 (ref 5.0–7.5)

## 2017-02-09 LAB — MICROSCOPIC EXAMINATION: BACTERIA UA: NONE SEEN

## 2017-02-09 LAB — BAYER DCA HB A1C WAIVED: HB A1C (BAYER DCA - WAIVED): 8.1 % — ABNORMAL HIGH (ref <7.0)

## 2017-02-09 MED ORDER — ATORVASTATIN CALCIUM 80 MG PO TABS: 80.0000 mg | ORAL_TABLET | Freq: Every day | ORAL | 12 refills | Status: DC

## 2017-02-09 MED ORDER — METOPROLOL TARTRATE 100 MG PO TABS: 100.0000 mg | ORAL_TABLET | Freq: Two times a day (BID) | ORAL | 12 refills | Status: DC

## 2017-02-09 MED ORDER — TAMSULOSIN HCL 0.4 MG PO CAPS: 0.4000 mg | ORAL_CAPSULE | Freq: Every day | ORAL | 12 refills | Status: DC

## 2017-02-09 MED ORDER — BENAZEPRIL HCL 40 MG PO TABS: 40.0000 mg | ORAL_TABLET | Freq: Every day | ORAL | 12 refills | Status: DC

## 2017-02-09 MED ORDER — SITAGLIPTIN PHOSPHATE 100 MG PO TABS: 100.0000 mg | ORAL_TABLET | Freq: Every day | ORAL | 12 refills | Status: DC

## 2017-02-09 MED ORDER — HYDROCHLOROTHIAZIDE 12.5 MG PO CAPS: 12.5000 mg | ORAL_CAPSULE | Freq: Every day | ORAL | 12 refills | Status: DC

## 2017-02-09 MED ORDER — METFORMIN HCL 500 MG PO TABS: 1000.0000 mg | ORAL_TABLET | Freq: Two times a day (BID) | ORAL | 10 refills | Status: DC

## 2017-02-09 MED ORDER — EMPAGLIFLOZIN 25 MG PO TABS: 25.0000 mg | ORAL_TABLET | Freq: Every day | ORAL | 12 refills | Status: DC

## 2017-02-09 NOTE — Progress Notes (Signed)
BP 110/70 (BP Location: Left Arm)   Pulse 83   Ht 5' 9.29" (1.76 m)   Wt 266 lb (120.7 kg)   SpO2 97%   BMI 38.95 kg/m    Subjective:    Patient ID: Joshua Lee, male    DOB: [birth year], 52 y.o.   MRN: [id number]  HPI: [name] is a 52 y.o. male  Chief Complaint  Patient presents with  . Annual Exam  Patient all in all doing well diabetes no low blood sugar spells or issues good control of diabetes. Blood pressure also good control takes medications without problems and no low spells. Cholesterol doing well. BPH symptoms all stable. Has some yeast infection issues reviewed how to care for intertrigo yeast infections.  Relevant past medical, surgical, family and social history reviewed and updated as indicated. Interim medical history since our last visit reviewed. Allergies and medications reviewed and updated.  Review of Systems  Constitutional: Negative.   HENT: Negative.   Eyes: Negative.   Respiratory: Negative.   Cardiovascular: Negative.   Gastrointestinal: Negative.   Endocrine: Negative.   Genitourinary: Negative.   Musculoskeletal: Negative.   Skin: Negative.   Allergic/Immunologic: Negative.   Neurological: Negative.   Hematological: Negative.   Psychiatric/Behavioral: Negative.     Per HPI unless specifically indicated above     Objective:    BP 110/70 (BP Location: Left Arm)   Pulse 83   Ht 5' 9.29" (1.76 m)   Wt 266 lb (120.7 kg)   SpO2 97%   BMI 38.95 kg/m   Wt Readings from Last 3 Encounters:  02/09/17 266 lb (120.7 kg)  11/10/16 266 lb (120.7 kg)  08/10/16 258 lb (117 kg)    Physical Exam  Constitutional: He is oriented to person, place, and time. He appears well-developed and well-nourished.  HENT:  Head: Normocephalic and atraumatic.  Right Ear: External ear normal.  Left Ear: External ear normal.  Nose: Nose normal.  Eyes: Conjunctivae and EOM are normal. Pupils are equal, round, and reactive to light.  Neck:  Normal range of motion. Neck supple. No thyromegaly present.  Cardiovascular: Normal rate, regular rhythm, normal heart sounds and intact distal pulses.  Pulmonary/Chest: Effort normal and breath sounds normal.  Abdominal: Soft. Bowel sounds are normal. There is no splenomegaly or hepatomegaly.  Genitourinary: Rectum normal, prostate normal and penis normal.  Musculoskeletal: Normal range of motion.  Lymphadenopathy:    He has no cervical adenopathy.  Neurological: He is alert and oriented to person, place, and time. He has normal reflexes.  Skin: Skin is warm and dry. No rash noted. No erythema.  Psychiatric: He has a normal mood and affect. His behavior is normal. Judgment and thought content normal.    Results for orders placed or performed in visit on 11/10/16  Bayer DCA Hb A1c Waived  Result Value Ref Range   Bayer DCA Hb A1c Waived 7.7 (H) <7.0 %      Assessment & Plan:   Problem List Items Addressed This Visit      Cardiovascular and Mediastinum   Hypertension - Primary   Relevant Medications   atorvastatin (LIPITOR) 80 MG tablet   benazepril (LOTENSIN) 40 MG tablet   hydrochlorothiazide (MICROZIDE) 12.5 MG capsule   empagliflozin (JARDIANCE) 25 MG TABS tablet   metoprolol tartrate (LOPRESSOR) 100 MG tablet   Other Relevant Orders   CBC with Differential/Platelet   Comprehensive metabolic panel   Lipid panel   Urinalysis, Routine w reflex  microscopic   Bayer DCA Hb A1c Waived     Endocrine   Diabetes mellitus without complication (HCC)   Relevant Medications   atorvastatin (LIPITOR) 80 MG tablet   benazepril (LOTENSIN) 40 MG tablet   metFORMIN (GLUCOPHAGE) 500 MG tablet   empagliflozin (JARDIANCE) 25 MG TABS tablet   sitaGLIPtin (JANUVIA) 100 MG tablet   Other Relevant Orders   CBC with Differential/Platelet   Comprehensive metabolic panel   Lipid panel   Urinalysis, Routine w reflex microscopic   Bayer DCA Hb A1c Waived     Other   Hyperlipidemia    Relevant Medications   atorvastatin (LIPITOR) 80 MG tablet   benazepril (LOTENSIN) 40 MG tablet   hydrochlorothiazide (MICROZIDE) 12.5 MG capsule   metoprolol tartrate (LOPRESSOR) 100 MG tablet   Other Relevant Orders   CBC with Differential/Platelet   Comprehensive metabolic panel   Lipid panel   Urinalysis, Routine w reflex microscopic   Bayer DCA Hb A1c Waived    Other Visit Diagnoses    Prostate cancer screening       Relevant Orders   PSA   Thyroid disorder screen       Relevant Orders   TSH       Follow up plan: Return in about 6 months (around 08/09/2017) for BMP,  Lipids, ALT, AST.

## 2017-02-10 LAB — LIPID PANEL
CHOLESTEROL TOTAL: 157 mg/dL (ref 100–199)
Chol/HDL Ratio: 6.5 ratio — ABNORMAL HIGH (ref 0.0–5.0)
HDL: 24 mg/dL — ABNORMAL LOW (ref >39)
Triglycerides: 946 mg/dL (ref 0–149)

## 2017-02-10 LAB — CBC WITH DIFFERENTIAL/PLATELET
BASOS ABS: 0 10*3/uL (ref 0.0–0.2)
BASOS: 0 %
EOS (ABSOLUTE): 0.1 10*3/uL (ref 0.0–0.4)
Eos: 2 %
HEMOGLOBIN: 13 g/dL (ref 13.0–17.7)
Hematocrit: 40.3 % (ref 37.5–51.0)
Immature Grans (Abs): 0 10*3/uL (ref 0.0–0.1)
Immature Granulocytes: 0 %
LYMPHS ABS: 1.1 10*3/uL (ref 0.7–3.1)
Lymphs: 16 %
MCH: 28.1 pg (ref 26.6–33.0)
MCHC: 32.3 g/dL (ref 31.5–35.7)
MCV: 87 fL (ref 79–97)
MONOCYTES: 6 %
Monocytes Absolute: 0.4 10*3/uL (ref 0.1–0.9)
NEUTROS ABS: 5.5 10*3/uL (ref 1.4–7.0)
Neutrophils: 76 %
Platelets: 207 10*3/uL (ref 150–379)
RBC: 4.62 x10E6/uL (ref 4.14–5.80)
RDW: 16.2 % — ABNORMAL HIGH (ref 12.3–15.4)
WBC: 7.2 10*3/uL (ref 3.4–10.8)

## 2017-02-10 LAB — COMPREHENSIVE METABOLIC PANEL
ALBUMIN: 4.1 g/dL (ref 3.5–5.5)
ALK PHOS: 44 IU/L (ref 39–117)
ALT: 29 IU/L (ref 0–44)
AST: 27 IU/L (ref 0–40)
Albumin/Globulin Ratio: 1.5 (ref 1.2–2.2)
BILIRUBIN TOTAL: 0.4 mg/dL (ref 0.0–1.2)
BUN / CREAT RATIO: 16 (ref 9–20)
BUN: 16 mg/dL (ref 6–24)
CHLORIDE: 94 mmol/L — AB (ref 96–106)
CO2: 21 mmol/L (ref 20–29)
Calcium: 9.3 mg/dL (ref 8.7–10.2)
Creatinine, Ser: 1.01 mg/dL (ref 0.76–1.27)
GFR calc non Af Amer: 86 mL/min/{1.73_m2} (ref >59)
GFR, EST AFRICAN AMERICAN: 99 mL/min/{1.73_m2} (ref >59)
GLOBULIN, TOTAL: 2.8 g/dL (ref 1.5–4.5)
GLUCOSE: 346 mg/dL — AB (ref 65–99)
POTASSIUM: 4.4 mmol/L (ref 3.5–5.2)
SODIUM: 138 mmol/L (ref 134–144)
TOTAL PROTEIN: 6.9 g/dL (ref 6.0–8.5)

## 2017-02-10 LAB — PSA: PROSTATE SPECIFIC AG, SERUM: 0.4 ng/mL (ref 0.0–4.0)

## 2017-02-10 LAB — TSH: TSH: 2.09 u[IU]/mL (ref 0.450–4.500)

## 2017-02-14 ENCOUNTER — Telehealth: Payer: Self-pay | Admitting: Family Medicine

## 2017-02-14 NOTE — Telephone Encounter (Signed)
Phone call Discussed with patient elevated triglycerides patient will do better with diabetes as we discussed at office visit patient will also increase his fish oil from 1 capsule a day to 4 a day.

## 2017-04-05 ENCOUNTER — Other Ambulatory Visit: Payer: Self-pay | Admitting: Family Medicine

## 2017-04-05 DIAGNOSIS — I10 Essential (primary) hypertension: Secondary | ICD-10-CM

## 2017-05-10 ENCOUNTER — Ambulatory Visit: Payer: BLUE CROSS/BLUE SHIELD | Admitting: Family Medicine

## 2017-05-10 ENCOUNTER — Encounter: Payer: Self-pay | Admitting: Family Medicine

## 2017-05-10 VITALS — BP 102/67 | HR 82 | Ht 69.2 in | Wt 256.0 lb

## 2017-05-10 DIAGNOSIS — E119 Type 2 diabetes mellitus without complications: Secondary | ICD-10-CM | POA: Diagnosis not present

## 2017-05-10 DIAGNOSIS — E782 Mixed hyperlipidemia: Secondary | ICD-10-CM | POA: Diagnosis not present

## 2017-05-10 DIAGNOSIS — I10 Essential (primary) hypertension: Secondary | ICD-10-CM

## 2017-05-10 LAB — LP+ALT+AST PICCOLO, WAIVED
ALT (SGPT) PICCOLO, WAIVED: 38 U/L (ref 10–47)
AST (SGOT) Piccolo, Waived: 30 U/L (ref 11–38)
Chol/HDL Ratio Piccolo,Waive: 4 mg/dL
Cholesterol Piccolo, Waived: 110 mg/dL (ref <200)
HDL CHOL PICCOLO, WAIVED: 28 mg/dL — AB (ref >59)
LDL Chol Calc Piccolo Waived: 29 mg/dL (ref <100)
TRIGLYCERIDES PICCOLO,WAIVED: 268 mg/dL — AB (ref <150)
VLDL CHOL CALC PICCOLO,WAIVE: 54 mg/dL — AB (ref <30)

## 2017-05-10 LAB — BAYER DCA HB A1C WAIVED: HB A1C (BAYER DCA - WAIVED): 6.7 % (ref <7.0)

## 2017-05-10 MED ORDER — FENOFIBRATE 145 MG PO TABS: 145.0000 mg | ORAL_TABLET | Freq: Every day | ORAL | 11 refills | Status: DC

## 2017-05-10 NOTE — Assessment & Plan Note (Signed)
The current medical regimen is effective;  continue present plan and medications.  

## 2017-05-10 NOTE — Progress Notes (Signed)
BP 102/67   Pulse 82   Ht 5' 9.2" (1.758 m)   Wt 256 lb (116.1 kg)   SpO2 98%   BMI 37.59 kg/m    Subjective:    Patient ID: Joshua Lee, male    DOB: 04/01/1965, 52 y.o.   MRN: [id number]  HPI: Joshua Lee is a 52 y.o. male  Chief Complaint  Patient presents with  . Follow-up  . Hypertension  . Diabetes  lipids  All in all doing well no complaints taken fenofibrate problems new start from cardiology. Blood pressure cholesterol diabetes all seem to be doing well taking medicines without problems Is even able to been able to lose 10 pounds in the last 3 months Blood pressures doing well also.   Relevant past medical, surgical, family and social history reviewed and updated as indicated. Interim medical history since our last visit reviewed. Allergies and medications reviewed and updated.  Review of Systems  Constitutional: Negative.   Respiratory: Negative.   Cardiovascular: Negative.     Per HPI unless specifically indicated above     Objective:    BP 102/67   Pulse 82   Ht 5' 9.2" (1.758 m)   Wt 256 lb (116.1 kg)   SpO2 98%   BMI 37.59 kg/m   Wt Readings from Last 3 Encounters:  05/10/17 256 lb (116.1 kg)  02/09/17 266 lb (120.7 kg)  11/10/16 266 lb (120.7 kg)    Physical Exam  Constitutional: He is oriented to person, place, and time. He appears well-developed and well-nourished.  HENT:  Head: Normocephalic and atraumatic.  Eyes: Conjunctivae and EOM are normal.  Neck: Normal range of motion.  Cardiovascular: Normal rate, regular rhythm and normal heart sounds.  Pulmonary/Chest: Effort normal and breath sounds normal.  Musculoskeletal: Normal range of motion.  Neurological: He is alert and oriented to person, place, and time.  Skin: No erythema.  Psychiatric: He has a normal mood and affect. His behavior is normal. Judgment and thought content normal.    Results for orders placed or performed in visit on 02/09/17  Microscopic  Examination  Result Value Ref Range   WBC, UA 0-5 0 - 5 /hpf   RBC, UA 3-10 (A) 0 - 2 /hpf   Epithelial Cells (non renal) 0-10 0 - 10 /hpf   Bacteria, UA None seen None seen/Few  CBC with Differential/Platelet  Result Value Ref Range   WBC 7.2 3.4 - 10.8 x10E3/uL   RBC 4.62 4.14 - 5.80 x10E6/uL   Hemoglobin 13.0 13.0 - 17.7 g/dL   Hematocrit 25.940.3 56.337.5 - 51.0 %   MCV 87 79 - 97 fL   MCH 28.1 26.6 - 33.0 pg   MCHC 32.3 31.5 - 35.7 g/dL   RDW 87.516.2 (H) 64.312.3 - 32.915.4 %   Platelets 207 150 - 379 x10E3/uL   Neutrophils 76 Not Estab. %   Lymphs 16 Not Estab. %   Monocytes 6 Not Estab. %   Eos 2 Not Estab. %   Basos 0 Not Estab. %   Neutrophils Absolute 5.5 1.4 - 7.0 x10E3/uL   Lymphocytes Absolute 1.1 0.7 - 3.1 x10E3/uL   Monocytes Absolute 0.4 0.1 - 0.9 x10E3/uL   EOS (ABSOLUTE) 0.1 0.0 - 0.4 x10E3/uL   Basophils Absolute 0.0 0.0 - 0.2 x10E3/uL   Immature Granulocytes 0 Not Estab. %   Immature Grans (Abs) 0.0 0.0 - 0.1 x10E3/uL  Comprehensive metabolic panel  Result Value Ref Range   Glucose 346 (H)  65 - 99 mg/dL   BUN 16 6 - 24 mg/dL   Creatinine, Ser 0.98 0.76 - 1.27 mg/dL   GFR calc non Af Amer 86 >59 mL/min/1.73   GFR calc Af Amer 99 >59 mL/min/1.73   BUN/Creatinine Ratio 16 9 - 20   Sodium 138 134 - 144 mmol/L   Potassium 4.4 3.5 - 5.2 mmol/L   Chloride 94 (L) 96 - 106 mmol/L   CO2 21 20 - 29 mmol/L   Calcium 9.3 8.7 - 10.2 mg/dL   Total Protein 6.9 6.0 - 8.5 g/dL   Albumin 4.1 3.5 - 5.5 g/dL   Globulin, Total 2.8 1.5 - 4.5 g/dL   Albumin/Globulin Ratio 1.5 1.2 - 2.2   Bilirubin Total 0.4 0.0 - 1.2 mg/dL   Alkaline Phosphatase 44 39 - 117 IU/L   AST 27 0 - 40 IU/L   ALT 29 0 - 44 IU/L  Lipid panel  Result Value Ref Range   Cholesterol, Total 157 100 - 199 mg/dL   Triglycerides 119 (HH) 0 - 149 mg/dL   HDL 24 (L) >14 mg/dL   VLDL Cholesterol Cal Comment 5 - 40 mg/dL   LDL Calculated Comment 0 - 99 mg/dL   Chol/HDL Ratio 6.5 (H) 0.0 - 5.0 ratio  PSA  Result Value  Ref Range   Prostate Specific Ag, Serum 0.4 0.0 - 4.0 ng/mL  TSH  Result Value Ref Range   TSH 2.090 0.450 - 4.500 uIU/mL  Urinalysis, Routine w reflex microscopic  Result Value Ref Range   Specific Gravity, UA <1.005 (L) 1.005 - 1.030   pH, UA 5.0 5.0 - 7.5   Color, UA Yellow Yellow   Appearance Ur Clear Clear   Leukocytes, UA Negative Negative   Protein, UA Negative Negative/Trace   Glucose, UA 3+ (A) Negative   Ketones, UA Negative Negative   RBC, UA 1+ (A) Negative   Bilirubin, UA Negative Negative   Urobilinogen, Ur 0.2 0.2 - 1.0 mg/dL   Nitrite, UA Negative Negative   Microscopic Examination See below:   Bayer DCA Hb A1c Waived  Result Value Ref Range   Bayer DCA Hb A1c Waived 8.1 (H) <7.0 %      Assessment & Plan:   Problem List Items Addressed This Visit      Cardiovascular and Mediastinum   Hypertension - Primary    The current medical regimen is effective;  continue present plan and medications.       Relevant Medications   fenofibrate (TRICOR) 145 MG tablet   Other Relevant Orders   Basic metabolic panel   LP+ALT+AST Piccolo, Waived     Endocrine   Diabetes mellitus without complication (HCC)    The current medical regimen is effective;  continue present plan and medications.       Relevant Orders   Basic metabolic panel   LP+ALT+AST Piccolo, Waived   Bayer DCA Hb A1c Waived     Other   Hyperlipidemia    The current medical regimen is effective;  continue present plan and medications.       Relevant Medications   fenofibrate (TRICOR) 145 MG tablet       Follow up plan: Return in about 3 months (around 08/09/2017) for Hemoglobin A1c.

## 2017-05-11 ENCOUNTER — Encounter: Payer: Self-pay | Admitting: Family Medicine

## 2017-05-11 LAB — BASIC METABOLIC PANEL
BUN/Creatinine Ratio: 16 (ref 9–20)
BUN: 20 mg/dL (ref 6–24)
CALCIUM: 9.8 mg/dL (ref 8.7–10.2)
CO2: 23 mmol/L (ref 20–29)
CREATININE: 1.26 mg/dL (ref 0.76–1.27)
Chloride: 96 mmol/L (ref 96–106)
GFR calc Af Amer: 76 mL/min/{1.73_m2} (ref >59)
GFR calc non Af Amer: 66 mL/min/{1.73_m2} (ref >59)
GLUCOSE: 225 mg/dL — AB (ref 65–99)
Potassium: 4.3 mmol/L (ref 3.5–5.2)
Sodium: 139 mmol/L (ref 134–144)

## 2017-05-30 ENCOUNTER — Other Ambulatory Visit: Payer: Self-pay | Admitting: Family Medicine

## 2017-08-10 ENCOUNTER — Ambulatory Visit (INDEPENDENT_AMBULATORY_CARE_PROVIDER_SITE_OTHER): Payer: BLUE CROSS/BLUE SHIELD | Admitting: Family Medicine

## 2017-08-10 ENCOUNTER — Other Ambulatory Visit: Payer: Self-pay | Admitting: Family Medicine

## 2017-08-10 ENCOUNTER — Encounter: Payer: Self-pay | Admitting: Family Medicine

## 2017-08-10 VITALS — BP 97/62 | HR 97 | Temp 98.8°F | Ht 69.0 in | Wt 257.8 lb

## 2017-08-10 DIAGNOSIS — E119 Type 2 diabetes mellitus without complications: Secondary | ICD-10-CM | POA: Diagnosis not present

## 2017-08-10 DIAGNOSIS — E782 Mixed hyperlipidemia: Secondary | ICD-10-CM | POA: Diagnosis not present

## 2017-08-10 DIAGNOSIS — I10 Essential (primary) hypertension: Secondary | ICD-10-CM | POA: Diagnosis not present

## 2017-08-10 LAB — BAYER DCA HB A1C WAIVED: HB A1C (BAYER DCA - WAIVED): 7.8 % — ABNORMAL HIGH (ref <7.0)

## 2017-08-10 MED ORDER — SITAGLIPTIN PHOSPHATE 100 MG PO TABS: 100.0000 mg | ORAL_TABLET | Freq: Every day | ORAL | 3 refills | Status: DC

## 2017-08-10 NOTE — Telephone Encounter (Signed)
Copied from CRM [phone]#[id number]. Topic: Quick Communication - Rx Refill/Question >> Aug 10, 2017 11:54 AM Gaynelle AduPoole, Shalonda wrote: Medication: JANUVIA 100 MG tablet  Has the patient contacted their pharmacy? No   Preferred Pharmacy (with phone number or street name): [id number] - [place] - [address] [facility] [address] & [address] [phone] (Phone) [phone] (Fax)      Agent: Please be advised that RX refills may take up to 3 business days. We ask that you follow-up with your pharmacy.

## 2017-08-10 NOTE — Assessment & Plan Note (Signed)
The current medical regimen is effective;  continue present plan and medications.  

## 2017-08-10 NOTE — Progress Notes (Signed)
BP 97/62 (BP Location: Left Arm, Patient Position: Sitting, Cuff Size: Large)   Pulse 97   Temp 98.8 F (37.1 C) (Tympanic)   Ht 5\' 9"  (1.753 m)   Wt 257 lb 12.8 oz (116.9 kg)   SpO2 97%   BMI 38.07 kg/m    Subjective:    Patient ID: Joshua Lee, male    DOB: 11/22/1965, 52 y.o.   MRN: [id number]  HPI: Joshua Lee is a 52 y.o. male  Chief Complaint  Patient presents with  . Diabetes  Discussed with patient diabetes patient has fallen off his diet this summer and not taking his good care of himself as previously. Taking medications without problems and no low blood sugar spells. Blood pressure good control with no lightheaded dizzy spells. Cholesterol also good control with no complaints.  Relevant past medical, surgical, family and social history reviewed and updated as indicated. Interim medical history since our last visit reviewed. Allergies and medications reviewed and updated.  Review of Systems  Constitutional: Negative.   Respiratory: Negative.   Cardiovascular: Negative.     Per HPI unless specifically indicated above     Objective:    BP 97/62 (BP Location: Left Arm, Patient Position: Sitting, Cuff Size: Large)   Pulse 97   Temp 98.8 F (37.1 C) (Tympanic)   Ht 5\' 9"  (1.753 m)   Wt 257 lb 12.8 oz (116.9 kg)   SpO2 97%   BMI 38.07 kg/m   Wt Readings from Last 3 Encounters:  08/10/17 257 lb 12.8 oz (116.9 kg)  05/10/17 256 lb (116.1 kg)  02/09/17 266 lb (120.7 kg)    Physical Exam  Constitutional: He is oriented to person, place, and time. He appears well-developed and well-nourished.  HENT:  Head: Normocephalic and atraumatic.  Eyes: Conjunctivae and EOM are normal.  Neck: Normal range of motion.  Cardiovascular: Normal rate, regular rhythm and normal heart sounds.  Pulmonary/Chest: Effort normal and breath sounds normal.  Musculoskeletal: Normal range of motion.  Neurological: He is alert and oriented to person, place, and time.    Skin: No erythema.  Psychiatric: He has a normal mood and affect. His behavior is normal. Judgment and thought content normal.    Results for orders placed or performed in visit on 05/10/17  Basic metabolic panel  Result Value Ref Range   Glucose 225 (H) 65 - 99 mg/dL   BUN 20 6 - 24 mg/dL   Creatinine, Ser [id number] 0.76 - 1.27 mg/dL   GFR calc non Af Amer 66 >59 mL/min/1.73   GFR calc Af Amer 76 >59 mL/min/1.73   BUN/Creatinine Ratio 16 9 - 20   Sodium 139 134 - 144 mmol/L   Potassium 4.3 3.5 - 5.2 mmol/L   Chloride 96 96 - 106 mmol/L   CO2 23 20 - 29 mmol/L   Calcium 9.8 8.7 - 10.2 mg/dL  LP+ALT+AST Piccolo, Waived  Result Value Ref Range   ALT (SGPT) Piccolo, Waived 38 10 - 47 U/L   AST (SGOT) Piccolo, Waived 30 11 - 38 U/L   Cholesterol Piccolo, Waived 110 <200 mg/dL   HDL Chol Piccolo, Waived 28 (L) >59 mg/dL   Triglycerides Piccolo,Waived 268 (H) <150 mg/dL   Chol/HDL Ratio Piccolo,Waive 4.0 mg/dL   LDL Chol Calc Piccolo Waived 29 <100 mg/dL   VLDL Chol Calc Piccolo,Waive 54 (H) <30 mg/dL  Bayer DCA Hb N6EA1c Waived  Result Value Ref Range   HB A1C (BAYER DCA - WAIVED) 6.7 <  7.0 %      Assessment & Plan:   Problem List Items Addressed This Visit      Cardiovascular and Mediastinum   Hypertension    The current medical regimen is effective;  continue present plan and medications.         Endocrine   Diabetes mellitus without complication (HCC) - Primary    Discussed poor control of diabetes needs to lose weight eat better exercise more.  Will not add medications at this point as patient will do better with diet control and lifestyle factors. Discussed importance of control and probability of starting injectable medications next visit if not better.      Relevant Orders   Bayer DCA Hb A1c Waived     Other   Hyperlipidemia    The current medical regimen is effective;  continue present plan and medications.           Follow up plan: Return in about 3 months  (around 11/10/2017) for Hemoglobin A1c.

## 2017-08-10 NOTE — Assessment & Plan Note (Signed)
Discussed poor control of diabetes needs to lose weight eat better exercise more.  Will not add medications at this point as patient will do better with diet control and lifestyle factors. Discussed importance of control and probability of starting injectable medications next visit if not better.

## 2017-09-07 ENCOUNTER — Other Ambulatory Visit: Payer: Self-pay | Admitting: Family Medicine

## 2017-09-07 DIAGNOSIS — I10 Essential (primary) hypertension: Secondary | ICD-10-CM

## 2017-09-07 MED ORDER — SITAGLIPTIN PHOSPHATE 100 MG PO TABS: 100.0000 mg | ORAL_TABLET | Freq: Every day | ORAL | 3 refills | Status: DC

## 2017-09-07 NOTE — Telephone Encounter (Signed)
Last OV: 08/10/17 Next OV: 11/10/17   Lab Results  Component Value Date   HGBA1C 6.9 09/04/2015   HGBA1C 6.9 09/04/2015

## 2017-11-10 ENCOUNTER — Ambulatory Visit (INDEPENDENT_AMBULATORY_CARE_PROVIDER_SITE_OTHER): Payer: BLUE CROSS/BLUE SHIELD | Admitting: Family Medicine

## 2017-11-10 ENCOUNTER — Encounter: Payer: Self-pay | Admitting: Family Medicine

## 2017-11-10 VITALS — BP 112/63 | HR 70 | Wt 254.0 lb

## 2017-11-10 DIAGNOSIS — I10 Essential (primary) hypertension: Secondary | ICD-10-CM

## 2017-11-10 DIAGNOSIS — E782 Mixed hyperlipidemia: Secondary | ICD-10-CM | POA: Diagnosis not present

## 2017-11-10 DIAGNOSIS — E119 Type 2 diabetes mellitus without complications: Secondary | ICD-10-CM | POA: Diagnosis not present

## 2017-11-10 LAB — BAYER DCA HB A1C WAIVED: HB A1C (BAYER DCA - WAIVED): 7.1 % — ABNORMAL HIGH (ref <7.0)

## 2017-11-10 NOTE — Assessment & Plan Note (Signed)
Reviewed and stable.

## 2017-11-10 NOTE — Assessment & Plan Note (Signed)
The current medical regimen is effective;  continue present plan and medications.  

## 2017-11-10 NOTE — Assessment & Plan Note (Signed)
Discussed diet/exercise 

## 2017-11-10 NOTE — Assessment & Plan Note (Signed)
Discussed diabetes control which is showing marked improvement patient will continue doing lifestyle measures to affect diabetes with weight loss and exercise.

## 2017-11-10 NOTE — Progress Notes (Signed)
   BP 112/63 (BP Location: Left Arm, Patient Position: Sitting, Cuff Size: Large)   Pulse 70   Wt 254 lb (115.2 kg)   SpO2 97%   BMI 37.51 kg/m    Subjective:    Patient ID: Joshua Lee, male    DOB: 06/20/1965, 52 y.o.   MRN: [id number]  HPI: Joshua Lee is a 52 y.o. male  Chief Complaint  Patient presents with  . Diabetes  Patient trying to do a little better with diet nutrition exercise has lost 3 pounds.  Taking medications faithfully without problems.  Taking cholesterol blood pressure medicine apparent good control has not had any low blood sugar spells from his diabetes medications.  And he takes them all faithfully.  Relevant past medical, surgical, family and social history reviewed and updated as indicated. Interim medical history since our last visit reviewed. Allergies and medications reviewed and updated.  Review of Systems  Constitutional: Negative.   Respiratory: Negative.   Cardiovascular: Negative.     Per HPI unless specifically indicated above     Objective:    BP 112/63 (BP Location: Left Arm, Patient Position: Sitting, Cuff Size: Large)   Pulse 70   Wt 254 lb (115.2 kg)   SpO2 97%   BMI 37.51 kg/m   Wt Readings from Last 3 Encounters:  11/10/17 254 lb (115.2 kg)  08/10/17 257 lb 12.8 oz (116.9 kg)  05/10/17 256 lb (116.1 kg)    Physical Exam  Constitutional: He is oriented to person, place, and time. He appears well-developed and well-nourished.  HENT:  Head: Normocephalic and atraumatic.  Eyes: Conjunctivae and EOM are normal.  Neck: Normal range of motion.  Cardiovascular: Normal rate, regular rhythm and normal heart sounds.  Pulmonary/Chest: Effort normal and breath sounds normal.  Musculoskeletal: Normal range of motion.  Neurological: He is alert and oriented to person, place, and time.  Skin: No erythema.  Psychiatric: He has a normal mood and affect. His behavior is normal. Judgment and thought content normal.    Results  for orders placed or performed in visit on 08/10/17  Bayer DCA Hb A1c Waived  Result Value Ref Range   HB A1C (BAYER DCA - WAIVED) 7.8 (H) <7.0 %      Assessment & Plan:   Problem List Items Addressed This Visit      Cardiovascular and Mediastinum   Hypertension    The current medical regimen is effective;  continue present plan and medications.         Endocrine   Diabetes mellitus without complication (HCC) - Primary    Discussed diabetes control which is showing marked improvement patient will continue doing lifestyle measures to affect diabetes with weight loss and exercise.      Relevant Orders   Bayer DCA Hb A1c Waived     Other   Hyperlipidemia    Reviewed and stable      Morbid obesity (HCC)    Discussed diet exercise          Follow up plan: Return in about 3 months (around 02/10/2018) for Physical Exam, Hemoglobin A1c.

## 2017-12-06 LAB — HM DIABETES EYE EXAM

## 2017-12-28 ENCOUNTER — Other Ambulatory Visit: Payer: Self-pay | Admitting: Family Medicine

## 2017-12-28 DIAGNOSIS — E119 Type 2 diabetes mellitus without complications: Secondary | ICD-10-CM

## 2018-01-05 ENCOUNTER — Other Ambulatory Visit: Payer: Self-pay | Admitting: Family Medicine

## 2018-01-05 NOTE — Telephone Encounter (Signed)
Requested Prescriptions  Pending Prescriptions Disp Refills  . JANUVIA 100 MG tablet [Pharmacy Med Name: JANUVIA 100MG  TABLETS] 90 tablet 0    Sig: TAKE 1 TABLET(100 MG) BY MOUTH DAILY     Endocrinology:  Diabetes - DPP-4 Inhibitors Passed - 01/05/2018 11:12 AM      Passed - HBA1C is between 0 and 7.9 and within 180 days    Hemoglobin A1C  Date Value Ref Range Status  09/04/2015 6.9  Final  09/04/2015 6.9  Final         Passed - Cr in normal range and within 360 days    Creatinine, Ser  Date Value Ref Range Status  05/10/2017 1.26 0.76 - 1.27 mg/dL Final         Passed - Valid encounter within last 6 months    Recent Outpatient Visits          1 month ago Diabetes mellitus without complication (HCC)   [facility] Crissman, Redge GainerMark A, MD   4 months ago Diabetes mellitus without complication (HCC)   [facility] Crissman, Redge GainerMark A, MD   8 months ago Essential hypertension   [facility] Crissman, Redge GainerMark A, MD   11 months ago Essential hypertension   [facility] Crissman, Redge GainerMark A, MD   1 year ago Essential hypertension   [facility] Crissman, Redge GainerMark A, MD      Future Appointments            In 1 month [facility], Redge GainerMark A, [facility]

## 2018-02-01 ENCOUNTER — Other Ambulatory Visit: Payer: Self-pay | Admitting: Family Medicine

## 2018-02-01 DIAGNOSIS — I10 Essential (primary) hypertension: Secondary | ICD-10-CM

## 2018-02-01 NOTE — Telephone Encounter (Signed)
Requested Prescriptions  Pending Prescriptions Disp Refills  . metoprolol tartrate (LOPRESSOR) 100 MG tablet [Pharmacy Med Name: METOPROLOL TARTRATE 100MG  TABLETS] 60 tablet 12    Sig: TAKE 1 TABLET BY MOUTH TWICE DAILY     Cardiovascular:  Beta Blockers Passed - 02/01/2018 10:38 AM      Passed - Last BP in normal range    BP Readings from Last 1 Encounters:  11/10/17 112/63         Passed - Last Heart Rate in normal range    Pulse Readings from Last 1 Encounters:  11/10/17 70         Passed - Valid encounter within last 6 months    Recent Outpatient Visits          2 months ago Diabetes mellitus without complication (HCC)   [facility] Crissman, Redge Gainer, MD   5 months ago Diabetes mellitus [facility] complication [facility]   [facility] Crissman, Redge Gainer, MD   8 months ago Essential hypertension   [facility] Crissman, Redge Gainer, MD   11 months ago Essential hypertension   [facility] Crissman, Redge Gainer, MD   1 year ago Essential hypertension   [facility] Crissman, Redge Gainer, MD      Future Appointments            In 3 weeks Crissman, Redge Gainer, MD [facility]

## 2018-02-02 IMAGING — DX DG FOOT COMPLETE 3+V*L*
3 series · 3 of 3 positions shown · non-contrast
Comparison: None.

CLINICAL DATA: Left foot pain for 3 weeks.

EXAM:
LEFT FOOT - COMPLETE 3+ VIEW

[foot ap]
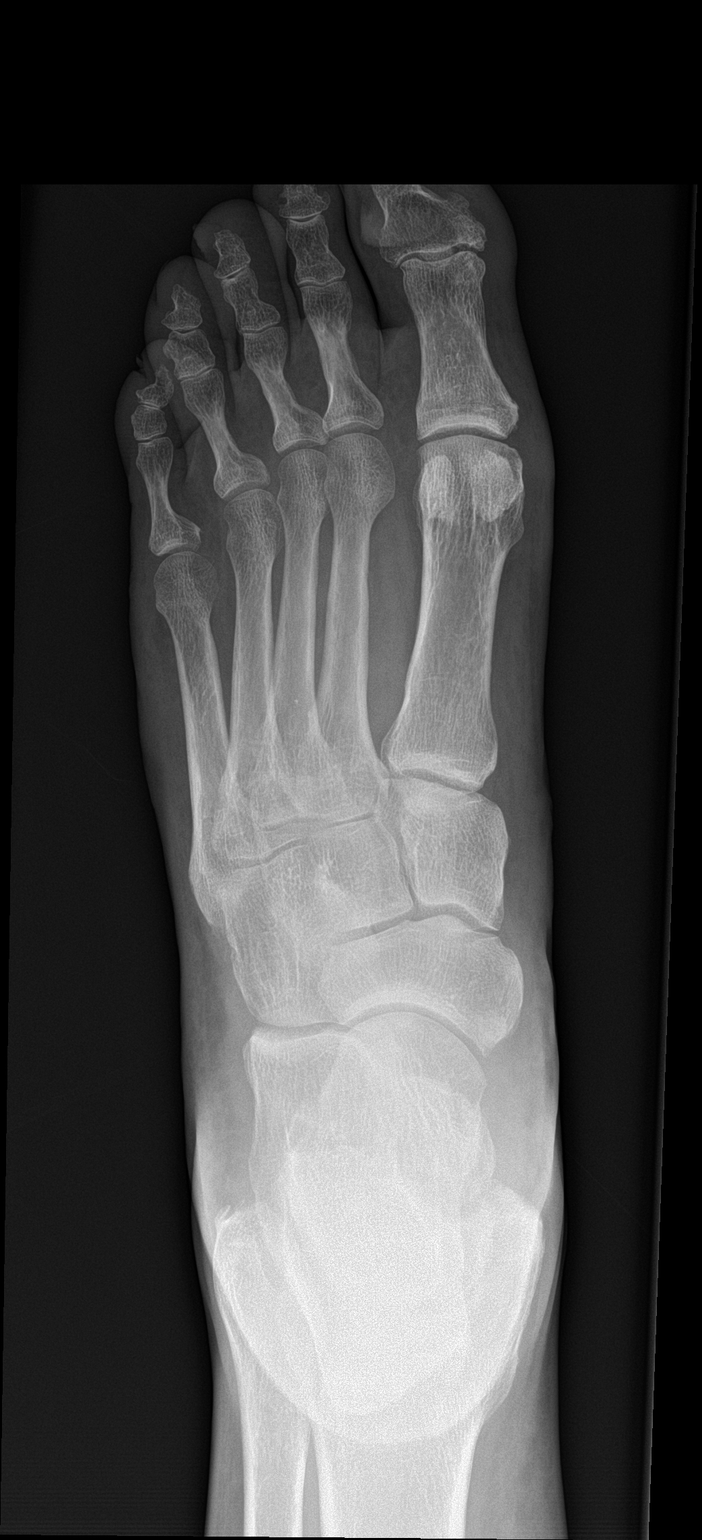

[foot obl]
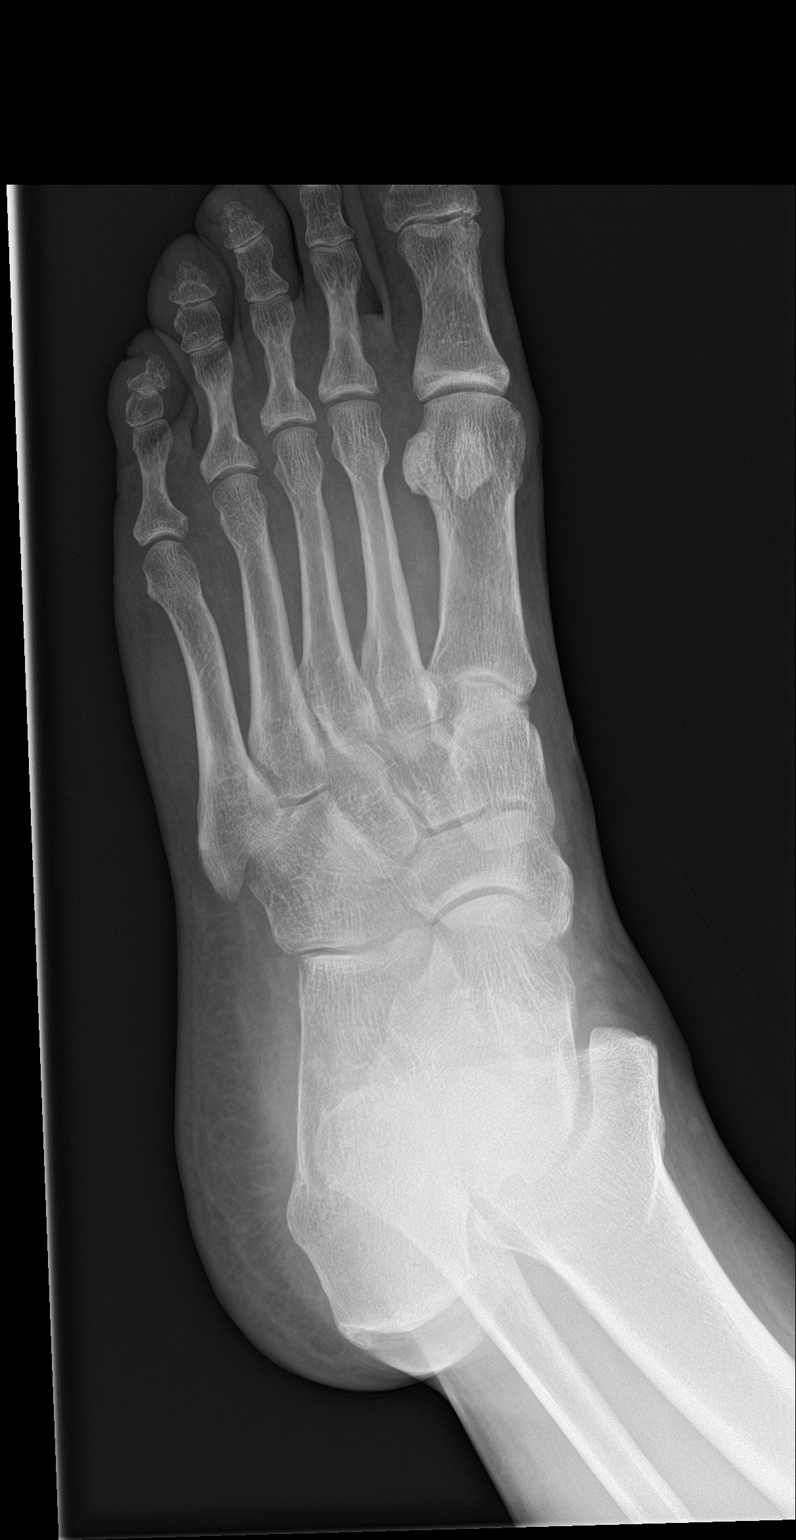

[foot lat]
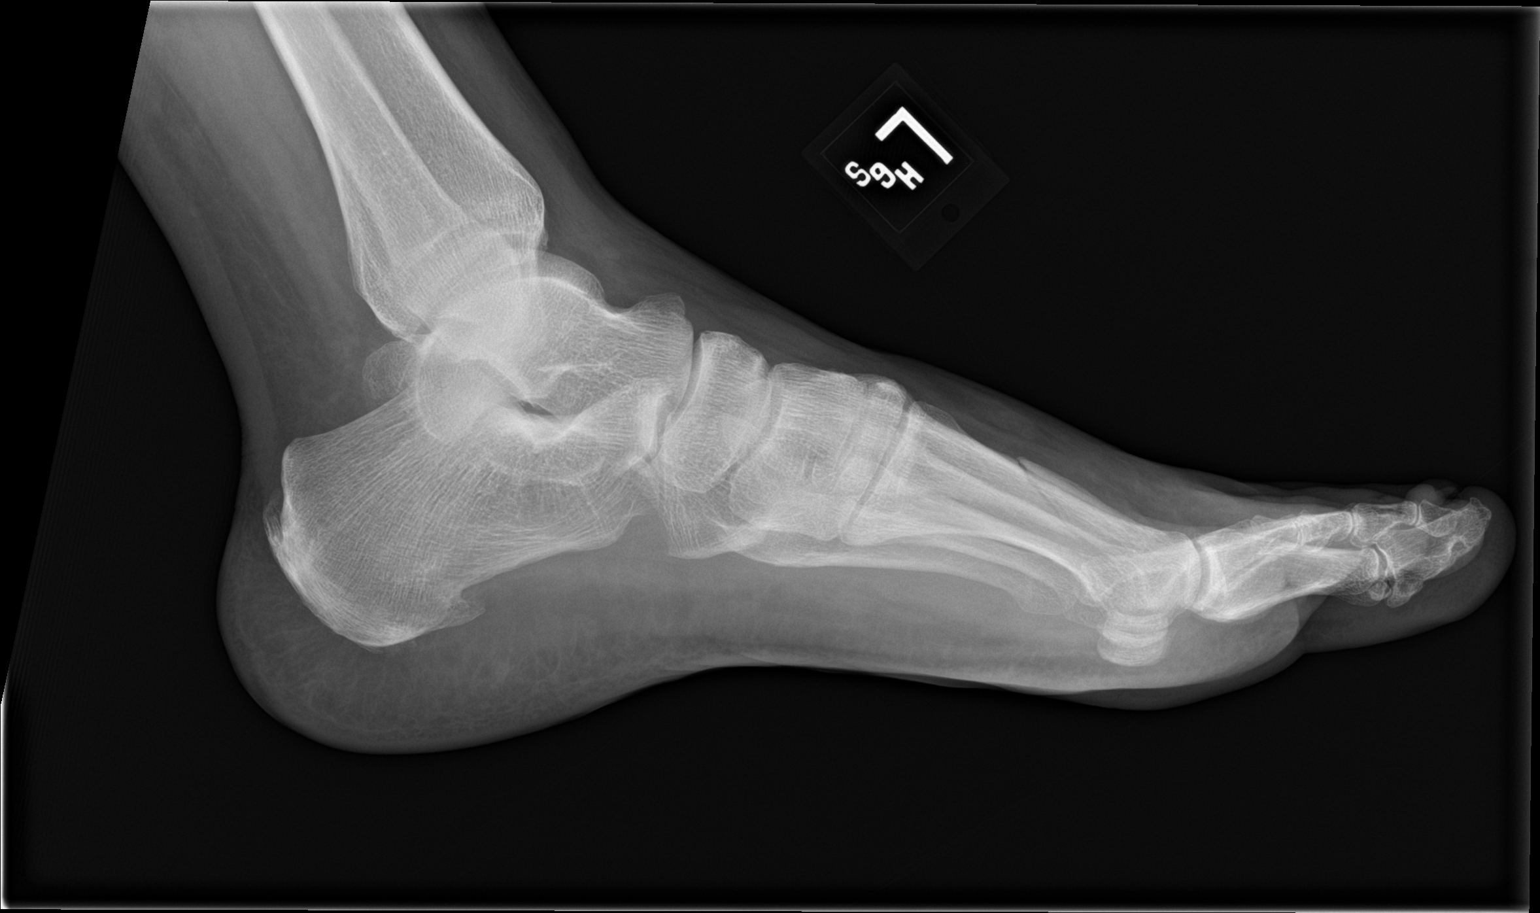

[3 of 3 positions shown; findings below may reference images not displayed]

FINDINGS: There is no evidence of fracture or dislocation. There is no
evidence of arthropathy or other focal bone abnormality. Soft
tissues are unremarkable.
IMPRESSION: No acute osseous injury of the left foot.

## 2018-02-19 ENCOUNTER — Other Ambulatory Visit: Payer: Self-pay | Admitting: Family Medicine

## 2018-02-19 DIAGNOSIS — I10 Essential (primary) hypertension: Secondary | ICD-10-CM

## 2018-02-20 NOTE — Telephone Encounter (Signed)
Requested medication (s) are due for refill today: yes  Requested medication (s) are on the active medication list: yes  Last refill:  02/09/17 expired   Future visit scheduled: yes  Notes to clinic:  Expired RX    Requested Prescriptions  Pending Prescriptions Disp Refills   tamsulosin (FLOMAX) 0.4 MG CAPS capsule [Pharmacy Med Name: TAMSULOSIN 0.4MG  CAPSULES] 30 capsule 12    Sig: TAKE 1 CAPSULE BY MOUTH ONCE DAILY     Urology: Alpha-Adrenergic Blocker Passed - 02/19/2018  3:29 AM      Passed - Last BP in normal range    BP Readings from Last 1 Encounters:  11/10/17 112/63         Passed - Valid encounter within last 12 months    Recent Outpatient Visits          3 months ago Diabetes mellitus without complication (HCC)   [facility] Crissman, Redge GainerMark A, MD   6 months ago Diabetes mellitus without complication (HCC)   [facility] Crissman, Redge GainerMark A, MD   9 months ago Essential hypertension   [facility] Crissman, Redge GainerMark A, MD   1 year ago Essential hypertension   [facility] Crissman, Redge GainerMark A, MD   1 year ago Essential hypertension   [facility] Crissman, Redge GainerMark A, MD      Future Appointments            In 1 week Crissman, Redge GainerMark A, MD [facility]          benazepril (LOTENSIN) 40 MG tablet [Pharmacy Med Name: BENAZEPRIL 40MG  TABLETS] 30 tablet 12    Sig: TAKE 1 TABLET BY MOUTH ONCE DAILY     Cardiovascular:  ACE Inhibitors Failed - 02/19/2018  3:29 AM      Failed - Cr in normal range and within 180 days    Creatinine, Ser  Date Value Ref Range Status  05/10/2017 1.26 0.76 - 1.27 mg/dL Final         Failed - K in normal range and within 180 days    Potassium  Date Value Ref Range Status  05/10/2017 4.3 3.5 - 5.2 mmol/L Final         Passed - Patient is not pregnant      Passed - Last BP in normal range    BP Readings from Last 1 Encounters:  11/10/17 112/63         Passed - Valid  encounter within last 6 months    Recent Outpatient Visits          3 months ago Diabetes mellitus without complication (HCC)   [facility] Crissman, Redge GainerMark A, MD   6 months ago Diabetes mellitus without complication (HCC)   [facility] Crissman, Redge GainerMark A, MD   9 months ago Essential hypertension   [facility] Crissman, Redge GainerMark A, MD   1 year ago Essential hypertension   [facility] Crissman, Redge GainerMark A, MD   1 year ago Essential hypertension   [facility] Crissman, Redge GainerMark A, MD      Future Appointments            In 1 week Crissman, Redge GainerMark A, MD [facility]          hydrochlorothiazide (MICROZIDE) 12.5 MG capsule [Pharmacy Med Name: HYDROCHLOROTHIAZIDE 12.5MG  CAPSULES] 30 capsule 12    Sig: TAKE 1 CAPSULE BY MOUTH ONCE DAILY     Cardiovascular: Diuretics -  Thiazide Passed - 02/19/2018  3:29 AM      Passed - Ca in normal range and within 360 days    Calcium  Date Value Ref Range Status  05/10/2017 9.8 8.7 - 10.2 mg/dL Final         Passed - Cr in normal range and within 360 days    Creatinine, Ser  Date Value Ref Range Status  05/10/2017 1.26 0.76 - 1.27 mg/dL Final         Passed - K in normal range and within 360 days    Potassium  Date Value Ref Range Status  05/10/2017 4.3 3.5 - 5.2 mmol/L Final         Passed - Na in normal range and within 360 days    Sodium  Date Value Ref Range Status  05/10/2017 139 134 - 144 mmol/L Final         Passed - Last BP in normal range    BP Readings from Last 1 Encounters:  11/10/17 112/63         Passed - Valid encounter within last 6 months    Recent Outpatient Visits          3 months ago Diabetes mellitus without complication (HCC)   [facility] Crissman, Redge Gainer, MD   6 months ago Diabetes mellitus without complication [facility])   [facility] Crissman, Redge Gainer, MD   9 months ago Essential hypertension   [facility]  Crissman, Redge Gainer, MD   1 year ago Essential hypertension   [facility] Crissman, Redge Gainer, MD   1 year ago Essential hypertension   [facility] Crissman, Redge Gainer, MD      Future Appointments            In 1 week Crissman, Redge Gainer, MD [facility]

## 2018-02-21 ENCOUNTER — Other Ambulatory Visit: Payer: Self-pay | Admitting: Family Medicine

## 2018-02-21 DIAGNOSIS — E782 Mixed hyperlipidemia: Secondary | ICD-10-CM

## 2018-02-21 NOTE — Telephone Encounter (Signed)
Requested Prescriptions  Pending Prescriptions Disp Refills  . atorvastatin (LIPITOR) 80 MG tablet [Pharmacy Med Name: ATORVASTATIN 80MG  TABLETS] 90 tablet 0    Sig: TAKE 1 TABLET BY MOUTH AT BEDTIME     Cardiovascular:  Antilipid - Statins Failed - 02/21/2018  3:29 AM      Failed - HDL in normal range and within 360 days    HDL  Date Value Ref Range Status  02/09/2017 24 (L) >39 mg/dL Final         Failed - Triglycerides in normal range and within 360 days    Triglycerides Piccolo,Waived  Date Value Ref Range Status  05/10/2017 268 (H) <150 mg/dL Final    Comment:                            Normal                   <150                         Borderline High     150 - 199                         High                200 - 499                         Very High                >499          Passed - Total Cholesterol in normal range and within 360 days    Cholesterol Piccolo, Waived  Date Value Ref Range Status  05/10/2017 110 <200 mg/dL Final    Comment:                            Desirable                <200                         Borderline High      200- 239                         High                     >239          Passed - LDL in normal range and within 360 days    LDL Calculated  Date Value Ref Range Status  02/09/2017 Comment 0 - 99 mg/dL Final    Comment:    Triglyceride result indicated is too high for an accurate LDL cholesterol estimation.          Passed - Patient is not pregnant      Passed - Valid encounter within last 12 months    Recent Outpatient Visits          3 months ago Diabetes mellitus without complication (HCC)   [facility] Crissman, Redge Gainer, MD   6 months ago Diabetes mellitus without complication [facility])   [facility] Crissman, Redge Gainer, MD   9 months ago Essential hypertension   [facility]  Redge Gainer, MD   1 year ago Essential hypertension   [facility] Crissman, Redge Gainer, MD    1 year ago Essential hypertension   [facility] Crissman, Redge Gainer, MD      Future Appointments            In 1 week Crissman, Redge Gainer, MD [facility]

## 2018-02-28 ENCOUNTER — Encounter: Payer: Self-pay | Admitting: Family Medicine

## 2018-02-28 ENCOUNTER — Ambulatory Visit (INDEPENDENT_AMBULATORY_CARE_PROVIDER_SITE_OTHER): Payer: BLUE CROSS/BLUE SHIELD | Admitting: Family Medicine

## 2018-02-28 VITALS — BP 130/81 | HR 71 | Temp 97.6°F | Ht 68.5 in | Wt 255.4 lb

## 2018-02-28 DIAGNOSIS — Z0000 Encounter for general adult medical examination without abnormal findings: Secondary | ICD-10-CM

## 2018-02-28 DIAGNOSIS — Z23 Encounter for immunization: Secondary | ICD-10-CM | POA: Diagnosis not present

## 2018-02-28 DIAGNOSIS — E1159 Type 2 diabetes mellitus with other circulatory complications: Secondary | ICD-10-CM

## 2018-02-28 DIAGNOSIS — E119 Type 2 diabetes mellitus without complications: Secondary | ICD-10-CM

## 2018-02-28 DIAGNOSIS — E669 Obesity, unspecified: Secondary | ICD-10-CM

## 2018-02-28 DIAGNOSIS — E782 Mixed hyperlipidemia: Secondary | ICD-10-CM

## 2018-02-28 DIAGNOSIS — I10 Essential (primary) hypertension: Secondary | ICD-10-CM | POA: Diagnosis not present

## 2018-02-28 DIAGNOSIS — E1169 Type 2 diabetes mellitus with other specified complication: Secondary | ICD-10-CM

## 2018-02-28 LAB — URINALYSIS, ROUTINE W REFLEX MICROSCOPIC
BILIRUBIN UA: NEGATIVE
Ketones, UA: NEGATIVE
Leukocytes, UA: NEGATIVE
Nitrite, UA: NEGATIVE
Protein, UA: NEGATIVE
RBC, UA: NEGATIVE
Specific Gravity, UA: 1.02 (ref 1.005–1.030)
Urobilinogen, Ur: 0.2 mg/dL (ref 0.2–1.0)
pH, UA: 5 (ref 5.0–7.5)

## 2018-02-28 LAB — BAYER DCA HB A1C WAIVED: HB A1C (BAYER DCA - WAIVED): 7.3 % — ABNORMAL HIGH (ref <7.0)

## 2018-02-28 MED ORDER — TAMSULOSIN HCL 0.4 MG PO CAPS: 0.4000 mg | ORAL_CAPSULE | Freq: Every day | ORAL | 4 refills | Status: DC

## 2018-02-28 MED ORDER — BENAZEPRIL HCL 40 MG PO TABS: 40.0000 mg | ORAL_TABLET | Freq: Every day | ORAL | 4 refills | Status: DC

## 2018-02-28 MED ORDER — SITAGLIPTIN PHOSPHATE 100 MG PO TABS: 100.0000 mg | ORAL_TABLET | Freq: Every day | ORAL | 4 refills | Status: DC

## 2018-02-28 MED ORDER — ATORVASTATIN CALCIUM 80 MG PO TABS: 80.0000 mg | ORAL_TABLET | Freq: Every day | ORAL | 4 refills | Status: DC

## 2018-02-28 MED ORDER — FENOFIBRATE 145 MG PO TABS: 145.0000 mg | ORAL_TABLET | Freq: Every day | ORAL | 4 refills | Status: DC

## 2018-02-28 MED ORDER — METFORMIN HCL 500 MG PO TABS: 1000.0000 mg | ORAL_TABLET | Freq: Two times a day (BID) | ORAL | 4 refills | Status: DC

## 2018-02-28 MED ORDER — HYDROCHLOROTHIAZIDE 12.5 MG PO CAPS: 12.5000 mg | ORAL_CAPSULE | Freq: Every day | ORAL | 4 refills | Status: DC

## 2018-02-28 MED ORDER — METOPROLOL TARTRATE 100 MG PO TABS: 100.0000 mg | ORAL_TABLET | Freq: Two times a day (BID) | ORAL | 4 refills | Status: DC

## 2018-02-28 MED ORDER — EMPAGLIFLOZIN 25 MG PO TABS: 25.0000 mg | ORAL_TABLET | Freq: Every day | ORAL | 4 refills | Status: DC

## 2018-02-28 NOTE — Progress Notes (Signed)
BP 130/81   Pulse 71   Temp 97.6 F (36.4 C) (Oral)   Ht 5' 8.5" (1.74 m)   Wt 255 lb 6.4 oz (115.8 kg)   SpO2 95%   BMI 38.27 kg/m    Subjective:    Patient ID: Joshua Lee, male    DOB: 04/04/1965, 53 y.o.   MRN: [id number]  HPI: Joshua BravoCharles T Dray is a 53 y.o. male  Chief Complaint  Patient presents with  . Annual Exam  . Diabetes  Patient follow-up all in all doing well no complaints from diabetes no low blood sugar spells. Cholesterol doing well with medications. BPH symptoms controlled with Flomax. Hypertension doing well with no complaints and good control.  Relevant past medical, surgical, family and social history reviewed and updated as indicated. Interim medical history since our last visit reviewed. Allergies and medications reviewed and updated.  Review of Systems  Constitutional: Negative.   HENT: Negative.   Eyes: Negative.   Respiratory: Negative.   Cardiovascular: Negative.   Gastrointestinal: Negative.   Endocrine: Negative.   Genitourinary: Negative.   Musculoskeletal: Negative.   Skin: Negative.   Allergic/Immunologic: Negative.   Neurological: Negative.   Hematological: Negative.   Psychiatric/Behavioral: Negative.     Per HPI unless specifically indicated above     Objective:    BP 130/81   Pulse 71   Temp 97.6 F (36.4 C) (Oral)   Ht 5' 8.5" (1.74 m)   Wt 255 lb 6.4 oz (115.8 kg)   SpO2 95%   BMI 38.27 kg/m   Wt Readings from Last 3 Encounters:  02/28/18 255 lb 6.4 oz (115.8 kg)  11/10/17 254 lb (115.2 kg)  08/10/17 257 lb 12.8 oz (116.9 kg)    Physical Exam Constitutional:      Appearance: He is well-developed.  HENT:     Head: Normocephalic and atraumatic.     Right Ear: External ear normal.     Left Ear: External ear normal.  Eyes:     Conjunctiva/sclera: Conjunctivae normal.     Pupils: Pupils are equal, round, and reactive to light.  Neck:     Musculoskeletal: Normal range of motion and neck supple.    Cardiovascular:     Rate and Rhythm: Normal rate and regular rhythm.     Heart sounds: Normal heart sounds.  Pulmonary:     Effort: Pulmonary effort is normal.     Breath sounds: Normal breath sounds.  Abdominal:     General: Bowel sounds are normal.     Palpations: Abdomen is soft. There is no hepatomegaly or splenomegaly.  Genitourinary:    Penis: Normal.      Prostate: Normal.     Rectum: Normal.  Musculoskeletal: Normal range of motion.  Skin:    Findings: No erythema or rash.  Neurological:     Mental Status: He is alert and oriented to person, place, and time.     Deep Tendon Reflexes: Reflexes are normal and symmetric.  Psychiatric:        Behavior: Behavior normal.        Thought Content: Thought content normal.        Judgment: Judgment normal.     Results for orders placed or performed in visit on 12/07/17  HM DIABETES EYE EXAM  Result Value Ref Range   HM Diabetic Eye Exam No Retinopathy No Retinopathy      Assessment & Plan:   Problem List Items Addressed This Visit  Cardiovascular and Mediastinum   Hypertension    The current medical regimen is effective;  continue present plan and medications.       Relevant Medications   empagliflozin (JARDIANCE) 25 MG TABS tablet   fenofibrate (TRICOR) 145 MG tablet   metoprolol tartrate (LOPRESSOR) 100 MG tablet   hydrochlorothiazide (MICROZIDE) 12.5 MG capsule   benazepril (LOTENSIN) 40 MG tablet   atorvastatin (LIPITOR) 80 MG tablet   Obesity, diabetes, and hypertension syndrome (HCC) - Primary    The current medical regimen is effective;  continue present plan and medications.       Relevant Medications   empagliflozin (JARDIANCE) 25 MG TABS tablet   fenofibrate (TRICOR) 145 MG tablet   metFORMIN (GLUCOPHAGE) 500 MG tablet   sitaGLIPtin (JANUVIA) 100 MG tablet   metoprolol tartrate (LOPRESSOR) 100 MG tablet   hydrochlorothiazide (MICROZIDE) 12.5 MG capsule   benazepril (LOTENSIN) 40 MG tablet    atorvastatin (LIPITOR) 80 MG tablet     Other   Hyperlipidemia    The current medical regimen is effective;  continue present plan and medications.       Relevant Medications   fenofibrate (TRICOR) 145 MG tablet   metoprolol tartrate (LOPRESSOR) 100 MG tablet   hydrochlorothiazide (MICROZIDE) 12.5 MG capsule   benazepril (LOTENSIN) 40 MG tablet   atorvastatin (LIPITOR) 80 MG tablet   Morbid obesity (HCC)    Discuss wt loss      Relevant Medications   empagliflozin (JARDIANCE) 25 MG TABS tablet   metFORMIN (GLUCOPHAGE) 500 MG tablet   sitaGLIPtin (JANUVIA) 100 MG tablet    Other Visit Diagnoses    Routine general medical examination at a health care facility       Relevant Orders   CBC with Differential/Platelet   Comprehensive metabolic panel   PSA   TSH   Lipid Panel w/o Chol/HDL Ratio   Urinalysis, Routine w reflex microscopic   Need for Td vaccine       Relevant Orders   Td vaccine greater than or equal to 7yo preservative free IM (Completed)       Follow up plan: Return in about 6 months (around 08/29/2018) for Hemoglobin A1c, BMP,  Lipids, ALT, AST.

## 2018-02-28 NOTE — Patient Instructions (Signed)
Td Vaccine (Tetanus and Diphtheria): What You Need to Know 1. Why get vaccinated? Tetanus  and diphtheria are very serious diseases. They are rare in the [place] today, but people who do become infected often have severe complications. Td vaccine is used to protect adolescents and adults from both of these diseases. Both tetanus and diphtheria are infections caused by bacteria. Diphtheria spreads from person to person through coughing or sneezing. Tetanus-causing bacteria enter the body through cuts, scratches, or wounds. TETANUS (Lockjaw) causes painful muscle tightening and stiffness, usually all over the body.  It can lead to tightening of muscles in the head and neck so you can't open your mouth, swallow, or sometimes even breathe. Tetanus kills about 1 out of every 10 people who are infected even after receiving the best medical care. DIPHTHERIA can cause a thick coating to form in the back of the throat.  It can lead to breathing problems, paralysis, heart failure, and death. Before vaccines, as many as 200,000 cases of diphtheria and hundreds of cases of tetanus were reported in the [place] each year. Since vaccination began, reports of cases for both diseases have dropped by about 99%. 2. Td vaccine Td vaccine can protect adolescents and adults from tetanus and diphtheria. Td is usually given as a booster dose every 10 years but it can also be given earlier after a severe and dirty wound or burn. Another vaccine, called Tdap, which protects against pertussis in addition to tetanus and diphtheria, is sometimes recommended instead of Td vaccine. Your doctor or the person giving you the vaccine can give you more information. Td may safely be given at the same time as other vaccines. 3. Some people should not get this vaccine  A person who has ever had a life-threatening allergic reaction after a previous dose of any tetanus or diphtheria containing vaccine, OR has a severe allergy  to any part of this vaccine, should not get Td vaccine. Tell the person giving the vaccine about any severe allergies.  Talk to your doctor if you: ? had severe pain or swelling after any vaccine containing diphtheria or tetanus, ? ever had a condition called Guillain Barr Syndrome (GBS), ? aren't feeling well on the day the shot is scheduled. 4. Risks of a vaccine reaction With any medicine, including vaccines, there is a chance of side effects. These are usually mild and go away on their own. Serious reactions are also possible but are rare. Most people who get Td vaccine do not have any problems with it. Mild Problems following Td vaccine: (Did not interfere with activities)  Pain where the shot was given (about 8 people in 10)  Redness or swelling where the shot was given (about 1 person in 4)  Mild fever (rare)  Headache (about 1 person in 4)  Tiredness (about 1 person in 4) Moderate Problems following Td vaccine: (Interfered with activities, but did not require medical attention)  Fever over 102F (rare) Severe Problems following Td vaccine: (Unable to perform usual activities; required medical attention)  Swelling, severe pain, bleeding and/or redness in the arm where the shot was given (rare). Problems that could happen after any vaccine:  People sometimes faint after a medical procedure, including vaccination. Sitting or lying down for about 15 minutes can help prevent fainting, and injuries caused by a fall. Tell your doctor if you feel dizzy, or have vision changes or ringing in the ears.  Some people get severe pain in the shoulder and have   difficulty moving the arm where a shot was given. This happens very rarely.  Any medication can cause a severe allergic reaction. Such reactions from a vaccine are very rare, estimated at fewer than 1 in a million doses, and would happen within a few minutes to a few hours after the vaccination. As with any medicine, there is a  very remote chance of a vaccine causing a serious injury or death. The safety of vaccines is always being monitored. For more information, visit: www.cdc.gov/vaccinesafety/ 5. What if there is a serious reaction? What should I look for?  Look for anything that concerns you, such as signs of a severe allergic reaction, very high fever, or unusual behavior. Signs of a severe allergic reaction can include hives, swelling of the face and throat, difficulty breathing, a fast heartbeat, dizziness, and weakness. These would usually start a few minutes to a few hours after the vaccination. What should I do?  If you think it is a severe allergic reaction or other emergency that can't wait, call 9-1-1 or get the person to the nearest hospital. Otherwise, call your doctor.  Afterward, the reaction should be reported to the Vaccine Adverse Event Reporting System (VAERS). Your doctor might file this report, or you can do it yourself through the VAERS web site at www.vaers.hhs.gov, or by calling 1-800-822-7967. VAERS does not give medical advice. 6. The National Vaccine Injury Compensation Program The National Vaccine Injury Compensation Program (VICP) is a federal program that was created to compensate people who may have been injured by certain vaccines. Persons who believe they may have been injured by a vaccine can learn about the program and about filing a claim by calling 1-800-338-2382 or visiting the VICP website at www.hrsa.gov/vaccinecompensation. There is a time limit to file a claim for compensation. 7. How can I learn more?  Ask your doctor. He or she can give you the vaccine package insert or suggest other sources of information.  Call your local or state health department.  Contact the Centers for Disease Control and Prevention (CDC): ? Call 1-800-232-4636 (1-800-CDC-INFO) ? Visit CDC's website at www.cdc.gov/vaccines Vaccine Information Statement Td Vaccine (04/29/15) This information is  not intended to replace advice given to you by your health care provider. Make sure you discuss any questions you have with your health care provider. Document Released: 11/01/2005 Document Revised: 08/22/2017 Document Reviewed: 08/22/2017 Elsevier Interactive Patient Education  2019 [employer] Inc.  

## 2018-02-28 NOTE — Assessment & Plan Note (Signed)
The current medical regimen is effective;  continue present plan and medications.  

## 2018-02-28 NOTE — Assessment & Plan Note (Signed)
Discuss wt loss 

## 2018-03-02 ENCOUNTER — Encounter: Payer: Self-pay | Admitting: Family Medicine

## 2018-03-02 LAB — CBC WITH DIFFERENTIAL/PLATELET
Basophils Absolute: 0 10*3/uL (ref 0.0–0.2)
Basos: 0 %
EOS (ABSOLUTE): 0.1 10*3/uL (ref 0.0–0.4)
Eos: 1 %
HEMOGLOBIN: 13.9 g/dL (ref 13.0–17.7)
Hematocrit: 43.2 % (ref 37.5–51.0)
Immature Grans (Abs): 0 10*3/uL (ref 0.0–0.1)
Immature Granulocytes: 1 %
Lymphocytes Absolute: 1.2 10*3/uL (ref 0.7–3.1)
Lymphs: 16 %
MCH: 26.7 pg (ref 26.6–33.0)
MCHC: 32.2 g/dL (ref 31.5–35.7)
MCV: 83 fL (ref 79–97)
MONOCYTES: 7 %
Monocytes Absolute: 0.5 10*3/uL (ref 0.1–0.9)
NEUTROS ABS: 5.6 10*3/uL (ref 1.4–7.0)
Neutrophils: 75 %
Platelets: 237 10*3/uL (ref 150–450)
RBC: 5.21 x10E6/uL (ref 4.14–5.80)
RDW: 15.6 % — ABNORMAL HIGH (ref 11.6–15.4)
WBC: 7.5 10*3/uL (ref 3.4–10.8)

## 2018-03-02 LAB — LIPID PANEL W/O CHOL/HDL RATIO
Cholesterol, Total: 140 mg/dL (ref 100–199)
HDL: 26 mg/dL — ABNORMAL LOW (ref >39)
LDL Calculated: 68 mg/dL (ref 0–99)
Triglycerides: 231 mg/dL — ABNORMAL HIGH (ref 0–149)
VLDL Cholesterol Cal: 46 mg/dL — ABNORMAL HIGH (ref 5–40)

## 2018-03-02 LAB — COMPREHENSIVE METABOLIC PANEL
ALBUMIN: 4.5 g/dL (ref 3.8–4.9)
ALT: 44 IU/L (ref 0–44)
AST: 30 IU/L (ref 0–40)
Albumin/Globulin Ratio: 1.6 (ref 1.2–2.2)
Alkaline Phosphatase: 37 IU/L — ABNORMAL LOW (ref 39–117)
BUN/Creatinine Ratio: 14 (ref 9–20)
BUN: 18 mg/dL (ref 6–24)
Bilirubin Total: 0.4 mg/dL (ref 0.0–1.2)
CO2: 23 mmol/L (ref 20–29)
Calcium: 9.8 mg/dL (ref 8.7–10.2)
Chloride: 97 mmol/L (ref 96–106)
Creatinine, Ser: 1.26 mg/dL (ref 0.76–1.27)
GFR calc Af Amer: 75 mL/min/{1.73_m2} (ref >59)
GFR calc non Af Amer: 65 mL/min/{1.73_m2} (ref >59)
Globulin, Total: 2.8 g/dL (ref 1.5–4.5)
Glucose: 184 mg/dL — ABNORMAL HIGH (ref 65–99)
Potassium: 4.4 mmol/L (ref 3.5–5.2)
Sodium: 139 mmol/L (ref 134–144)
Total Protein: 7.3 g/dL (ref 6.0–8.5)

## 2018-03-02 LAB — PSA: Prostate Specific Ag, Serum: 0.5 ng/mL (ref 0.0–4.0)

## 2018-03-02 LAB — TSH: TSH: 2.97 u[IU]/mL (ref 0.450–4.500)

## 2018-03-02 NOTE — Progress Notes (Signed)
Your blood work is stable.

## 2018-04-05 ENCOUNTER — Other Ambulatory Visit: Payer: Self-pay | Admitting: Family Medicine

## 2018-08-29 ENCOUNTER — Other Ambulatory Visit: Payer: BLUE CROSS/BLUE SHIELD

## 2018-08-29 ENCOUNTER — Encounter: Payer: Self-pay | Admitting: Family Medicine

## 2018-08-29 ENCOUNTER — Other Ambulatory Visit: Payer: Self-pay

## 2018-08-29 ENCOUNTER — Ambulatory Visit (INDEPENDENT_AMBULATORY_CARE_PROVIDER_SITE_OTHER): Payer: BLUE CROSS/BLUE SHIELD | Admitting: Family Medicine

## 2018-08-29 DIAGNOSIS — E669 Obesity, unspecified: Secondary | ICD-10-CM

## 2018-08-29 DIAGNOSIS — I10 Essential (primary) hypertension: Secondary | ICD-10-CM | POA: Diagnosis not present

## 2018-08-29 DIAGNOSIS — E782 Mixed hyperlipidemia: Secondary | ICD-10-CM | POA: Diagnosis not present

## 2018-08-29 DIAGNOSIS — E1169 Type 2 diabetes mellitus with other specified complication: Secondary | ICD-10-CM

## 2018-08-29 DIAGNOSIS — E1159 Type 2 diabetes mellitus with other circulatory complications: Secondary | ICD-10-CM

## 2018-08-29 LAB — LP+ALT+AST PICCOLO, WAIVED
ALT (SGPT) Piccolo, Waived: 31 U/L (ref 10–47)
AST (SGOT) Piccolo, Waived: 31 U/L (ref 11–38)
Chol/HDL Ratio Piccolo,Waive: 3.7 mg/dL
Cholesterol Piccolo, Waived: 108 mg/dL (ref <200)
HDL Chol Piccolo, Waived: 29 mg/dL — ABNORMAL LOW (ref >59)
LDL Chol Calc Piccolo Waived: 35 mg/dL (ref <100)
Triglycerides Piccolo,Waived: 221 mg/dL — ABNORMAL HIGH (ref <150)
VLDL Chol Calc Piccolo,Waive: 44 mg/dL — ABNORMAL HIGH (ref <30)

## 2018-08-29 LAB — BAYER DCA HB A1C WAIVED: HB A1C (BAYER DCA - WAIVED): 7.4 % — ABNORMAL HIGH (ref <7.0)

## 2018-08-29 NOTE — Assessment & Plan Note (Signed)
Wt loss discussed 

## 2018-08-29 NOTE — Assessment & Plan Note (Signed)
The current medical regimen is effective;  continue present plan and medications.  

## 2018-08-29 NOTE — Progress Notes (Signed)
There were no vitals taken for this visit.   Subjective:    Patient ID: Joshua Lee, male    DOB: 08-28-1965, 53 y.o.   MRN: [id number]  HPI: Joshua Lee is a 53 y.o. male  Med check   Relevant past medical, surgical, family and social history reviewed and updated as indicated. Interim medical history since our last visit reviewed. Allergies and medications reviewed and updated.  Review of Systems  Constitutional: Negative.   Respiratory: Negative.   Cardiovascular: Negative.     Per HPI unless specifically indicated above     Objective:    There were no vitals taken for this visit.  Wt Readings from Last 3 Encounters:  02/28/18 255 lb 6.4 oz (115.8 kg)  11/10/17 254 lb (115.2 kg)  08/10/17 257 lb 12.8 oz (116.9 kg)    Physical Exam  Results for orders placed or performed in visit on 02/28/18  Bayer DCA Hb A1c Waived  Result Value Ref Range   HB A1C (BAYER DCA - WAIVED) 7.3 (H) <7.0 %  CBC with Differential/Platelet  Result Value Ref Range   WBC 7.5 3.4 - 10.8 x10E3/uL   RBC 5.21 4.14 - 5.80 x10E6/uL   Hemoglobin 13.9 13.0 - 17.7 g/dL   Hematocrit 43.2 37.5 - 51.0 %   MCV 83 79 - 97 fL   MCH 26.7 26.6 - 33.0 pg   MCHC 32.2 31.5 - 35.7 g/dL   RDW 15.6 (H) 11.6 - 15.4 %   Platelets 237 150 - 450 x10E3/uL   Neutrophils 75 Not Estab. %   Lymphs 16 Not Estab. %   Monocytes 7 Not Estab. %   Eos 1 Not Estab. %   Basos 0 Not Estab. %   Neutrophils Absolute 5.6 1.4 - 7.0 x10E3/uL   Lymphocytes Absolute 1.2 0.7 - 3.1 x10E3/uL   Monocytes Absolute 0.5 0.1 - 0.9 x10E3/uL   EOS (ABSOLUTE) 0.1 0.0 - 0.4 x10E3/uL   Basophils Absolute 0.0 0.0 - 0.2 x10E3/uL   Immature Granulocytes 1 Not Estab. %   Immature Grans (Abs) 0.0 0.0 - 0.1 x10E3/uL  Comprehensive metabolic panel  Result Value Ref Range   Glucose 184 (H) 65 - 99 mg/dL   BUN 18 6 - 24 mg/dL   Creatinine, Ser 1.26 0.76 - 1.27 mg/dL   GFR calc non Af Amer 65 >59 mL/min/1.73   GFR calc Af Amer 75 >59  mL/min/1.73   BUN/Creatinine Ratio 14 9 - 20   Sodium 139 134 - 144 mmol/L   Potassium 4.4 3.5 - 5.2 mmol/L   Chloride 97 96 - 106 mmol/L   CO2 23 20 - 29 mmol/L   Calcium 9.8 8.7 - 10.2 mg/dL   Total Protein 7.3 6.0 - 8.5 g/dL   Albumin 4.5 3.8 - 4.9 g/dL   Globulin, Total 2.8 1.5 - 4.5 g/dL   Albumin/Globulin Ratio 1.6 1.2 - 2.2   Bilirubin Total 0.4 0.0 - 1.2 mg/dL   Alkaline Phosphatase 37 (L) 39 - 117 IU/L   AST 30 0 - 40 IU/L   ALT 44 0 - 44 IU/L  PSA  Result Value Ref Range   Prostate Specific Ag, Serum 0.5 0.0 - 4.0 ng/mL  TSH  Result Value Ref Range   TSH 2.970 0.450 - 4.500 uIU/mL  Lipid Panel w/o Chol/HDL Ratio  Result Value Ref Range   Cholesterol, Total 140 100 - 199 mg/dL   Triglycerides 231 (H) 0 - 149 mg/dL   HDL  26 (L) >39 mg/dL   VLDL Cholesterol Cal 46 (H) 5 - 40 mg/dL   LDL Calculated 68 0 - 99 mg/dL  Urinalysis, Routine w reflex microscopic  Result Value Ref Range   Specific Gravity, UA 1.020 1.005 - 1.030   pH, UA 5.0 5.0 - 7.5   Color, UA Yellow Yellow   Appearance Ur Clear Clear   Leukocytes, UA Negative Negative   Protein, UA Negative Negative/Trace   Glucose, UA 3+ (A) Negative   Ketones, UA Negative Negative   RBC, UA Negative Negative   Bilirubin, UA Negative Negative   Urobilinogen, Ur 0.2 0.2 - 1.0 mg/dL   Nitrite, UA Negative Negative      Assessment & Plan:   Problem List Items Addressed This Visit      Cardiovascular and Mediastinum   Hypertension    The current medical regimen is effective;  continue present plan and medications.       Relevant Orders   Basic metabolic panel   Obesity, diabetes, and hypertension syndrome (HCC)    The current medical regimen is effective;  continue present plan and medications.       Relevant Orders   Bayer DCA Hb A1c Waived     Other   Hyperlipidemia    The current medical regimen is effective;  continue present plan and medications.       Relevant Orders   LP+ALT+AST Piccolo,  Waived   Morbid obesity (HCC)    Wt loss discussed         Telemedicine using audio/video telecommunications for a synchronous communication visit. Today's visit due to COVID-19 isolation precautions I connected with and verified that I am speaking with the correct person using two identifiers.   I discussed the limitations, risks, security and privacy concerns of performing an evaluation and management service by telecommunication and the availability of in person appointments. I also discussed with the patient that there may be a patient responsible charge related to this service. The patient expressed understanding and agreed to proceed. The patient's location is home. I am at home.   I discussed the assessment and treatment plan with the patient. The patient was provided an opportunity to ask questions and all were answered. The patient agreed with the plan and demonstrated an understanding of the instructions.   The patient was advised to call back or seek an in-person evaluation if the symptoms worsen or if the condition fails to improve as anticipated.   I provided 21+ minutes of time during this encounter. Follow up plan: Return in about 6 months (around 03/01/2019) for Hemoglobin A1c, Physical Exam.

## 2018-08-30 LAB — BASIC METABOLIC PANEL
BUN/Creatinine Ratio: 20 (ref 9–20)
BUN: 20 mg/dL (ref 6–24)
CO2: 24 mmol/L (ref 20–29)
Calcium: 9.4 mg/dL (ref 8.7–10.2)
Chloride: 100 mmol/L (ref 96–106)
Creatinine, Ser: 0.98 mg/dL (ref 0.76–1.27)
GFR calc Af Amer: 102 mL/min/{1.73_m2} (ref >59)
GFR calc non Af Amer: 88 mL/min/{1.73_m2} (ref >59)
Glucose: 195 mg/dL — ABNORMAL HIGH (ref 65–99)
Potassium: 4.5 mmol/L (ref 3.5–5.2)
Sodium: 141 mmol/L (ref 134–144)

## 2018-10-16 ENCOUNTER — Telehealth: Payer: Self-pay

## 2018-10-16 NOTE — Telephone Encounter (Signed)
Please see message below  Copied from [place] [phone] [phone]. Topic: General - Inquiry >> Oct 16, 2018  9:11 AM Percell Belt A wrote: Reason for CRM: pt call in and stated that he would like a all his refills to be 90 days per his ins.  They will only cover the 90 supplies.  Just fyi

## 2018-12-04 ENCOUNTER — Telehealth: Payer: Self-pay | Admitting: Family Medicine

## 2018-12-04 ENCOUNTER — Other Ambulatory Visit: Payer: BLUE CROSS/BLUE SHIELD

## 2018-12-04 DIAGNOSIS — N200 Calculus of kidney: Secondary | ICD-10-CM

## 2018-12-04 NOTE — Telephone Encounter (Signed)
That is fine. Order in. He can come get it when he wants

## 2018-12-04 NOTE — Telephone Encounter (Signed)
Pt called in stating that he needs to have labs done for his urologist that is in [facility], due to increasing medication from 2 pills to 3. He states that if it is ok he would rather have labs done here. Pt is going to bring in a copy of what is needed in a few minutes. Please advise.

## 2018-12-04 NOTE — Telephone Encounter (Signed)
Pt scheduled for tomorrow 12/04/2018

## 2018-12-05 ENCOUNTER — Other Ambulatory Visit: Payer: BLUE CROSS/BLUE SHIELD

## 2018-12-05 DIAGNOSIS — N200 Calculus of kidney: Secondary | ICD-10-CM

## 2018-12-06 LAB — BASIC METABOLIC PANEL
BUN/Creatinine Ratio: 16 (ref 9–20)
BUN: 16 mg/dL (ref 6–24)
CO2: 21 mmol/L (ref 20–29)
Calcium: 9.3 mg/dL (ref 8.7–10.2)
Chloride: 96 mmol/L (ref 96–106)
Creatinine, Ser: 1 mg/dL (ref 0.76–1.27)
GFR calc Af Amer: 99 mL/min/{1.73_m2} (ref >59)
GFR calc non Af Amer: 86 mL/min/{1.73_m2} (ref >59)
Glucose: 301 mg/dL — ABNORMAL HIGH (ref 65–99)
Potassium: 4.5 mmol/L (ref 3.5–5.2)
Sodium: 138 mmol/L (ref 134–144)

## 2019-01-10 LAB — HM DIABETES EYE EXAM

## 2019-03-06 ENCOUNTER — Telehealth: Payer: Self-pay

## 2019-03-06 ENCOUNTER — Other Ambulatory Visit: Payer: Self-pay

## 2019-03-06 ENCOUNTER — Encounter: Payer: Self-pay | Admitting: Family Medicine

## 2019-03-06 ENCOUNTER — Ambulatory Visit (INDEPENDENT_AMBULATORY_CARE_PROVIDER_SITE_OTHER): Payer: BLUE CROSS/BLUE SHIELD | Admitting: Family Medicine

## 2019-03-06 VITALS — BP 134/77 | HR 81 | Temp 97.5°F | Ht 68.5 in | Wt 255.0 lb

## 2019-03-06 DIAGNOSIS — E1159 Type 2 diabetes mellitus with other circulatory complications: Secondary | ICD-10-CM

## 2019-03-06 DIAGNOSIS — Z0000 Encounter for general adult medical examination without abnormal findings: Secondary | ICD-10-CM | POA: Diagnosis not present

## 2019-03-06 DIAGNOSIS — Z125 Encounter for screening for malignant neoplasm of prostate: Secondary | ICD-10-CM

## 2019-03-06 DIAGNOSIS — N182 Chronic kidney disease, stage 2 (mild): Secondary | ICD-10-CM

## 2019-03-06 DIAGNOSIS — I152 Hypertension secondary to endocrine disorders: Secondary | ICD-10-CM

## 2019-03-06 DIAGNOSIS — Z1211 Encounter for screening for malignant neoplasm of colon: Secondary | ICD-10-CM

## 2019-03-06 DIAGNOSIS — N183 Chronic kidney disease, stage 3 unspecified: Secondary | ICD-10-CM | POA: Insufficient documentation

## 2019-03-06 DIAGNOSIS — I10 Essential (primary) hypertension: Secondary | ICD-10-CM | POA: Diagnosis not present

## 2019-03-06 DIAGNOSIS — E1169 Type 2 diabetes mellitus with other specified complication: Secondary | ICD-10-CM

## 2019-03-06 DIAGNOSIS — E669 Obesity, unspecified: Secondary | ICD-10-CM

## 2019-03-06 DIAGNOSIS — E1122 Type 2 diabetes mellitus with diabetic chronic kidney disease: Secondary | ICD-10-CM

## 2019-03-06 DIAGNOSIS — E782 Mixed hyperlipidemia: Secondary | ICD-10-CM

## 2019-03-06 LAB — UA/M W/RFLX CULTURE, ROUTINE
Bilirubin, UA: NEGATIVE
Leukocytes,UA: NEGATIVE
Nitrite, UA: NEGATIVE
Protein,UA: NEGATIVE
RBC, UA: NEGATIVE
Specific Gravity, UA: 1.01 (ref 1.005–1.030)
Urobilinogen, Ur: 0.2 mg/dL (ref 0.2–1.0)
pH, UA: 5 (ref 5.0–7.5)

## 2019-03-06 LAB — MICROALBUMIN, URINE WAIVED
Creatinine, Urine Waived: 10 mg/dL (ref 10–300)
Microalb, Ur Waived: 10 mg/L (ref 0–19)
Microalb/Creat Ratio: <30 mg/g (ref <30)

## 2019-03-06 LAB — BAYER DCA HB A1C WAIVED: HB A1C (BAYER DCA - WAIVED): 8.6 % — ABNORMAL HIGH (ref <7.0)

## 2019-03-06 MED ORDER — ATORVASTATIN CALCIUM 80 MG PO TABS: 80.0000 mg | ORAL_TABLET | Freq: Every day | ORAL | 1 refills | Status: DC

## 2019-03-06 MED ORDER — JARDIANCE 25 MG PO TABS: 25.0000 mg | ORAL_TABLET | Freq: Every day | ORAL | 1 refills | Status: DC

## 2019-03-06 MED ORDER — METOPROLOL TARTRATE 100 MG PO TABS: 100.0000 mg | ORAL_TABLET | Freq: Two times a day (BID) | ORAL | 1 refills | Status: DC

## 2019-03-06 MED ORDER — METFORMIN HCL 500 MG PO TABS: 1000.0000 mg | ORAL_TABLET | Freq: Two times a day (BID) | ORAL | 1 refills | Status: DC

## 2019-03-06 MED ORDER — HYDROCHLOROTHIAZIDE 12.5 MG PO CAPS: 12.5000 mg | ORAL_CAPSULE | Freq: Every day | ORAL | 1 refills | Status: DC

## 2019-03-06 MED ORDER — TAMSULOSIN HCL 0.4 MG PO CAPS: 0.4000 mg | ORAL_CAPSULE | Freq: Every day | ORAL | 1 refills | Status: DC

## 2019-03-06 MED ORDER — FENOFIBRATE 145 MG PO TABS: 145.0000 mg | ORAL_TABLET | Freq: Every day | ORAL | 1 refills | Status: DC

## 2019-03-06 MED ORDER — OZEMPIC (0.25 OR 0.5 MG/DOSE) 2 MG/1.5ML ~~LOC~~ SOPN: 0.2500 mg | PEN_INJECTOR | SUBCUTANEOUS | 0 refills | Status: DC

## 2019-03-06 MED ORDER — BENAZEPRIL HCL 40 MG PO TABS: 40.0000 mg | ORAL_TABLET | Freq: Every day | ORAL | 1 refills | Status: DC

## 2019-03-06 NOTE — Assessment & Plan Note (Signed)
Not under good control with A1c of 8.6. Will change from [place] to ozempic and recheck in 1 month for tolerance. Call with any concerns. Continue to monitor.

## 2019-03-06 NOTE — Assessment & Plan Note (Signed)
Under good control on current regimen. Continue current regimen. Continue to monitor. Call with any concerns. Refills given. Labs drawn today.   

## 2019-03-06 NOTE — Telephone Encounter (Signed)
Copied from CRM [id number]. Topic: General - Inquiry >> Mar 06, 2019 12:11 PM Reggie Pile, [place] wrote: Reason for CRM: Pt called in stating he would like to speak with a nurse in regards to recent visit and has a few questions in regards to medication. Please advise. >> Mar 06, 2019  1:42 PM Dalphine Handing A wrote: Patient is requesting a callback in regards to his medications that he should be taking. Patient also would like an ozempic coupon ready for him that he can pick up today. Please advise    Called and spoke to patient. Patient wanted to know if he could take Vescepa with the Ozempic. Patient also states that he is not taking the Fenofibrate, but taking the Vescepa instead. Wants to know if all of this was ok.   Asked Dr.Johnson verbally about patient's questions. OK for patient to take the Vescepa with Ozempic. Dr. Laural Benes asked to D/C Fenofibrate out of the chart.   Let patient know information from Dr. Laural Benes. Patient also asked about cholesterol results. Let patient know that we would not have results back until tomorrow.

## 2019-03-06 NOTE — Progress Notes (Signed)
BP 134/77 (BP Location: Left Arm, Patient Position: Sitting, Cuff Size: Normal)    Pulse 81    Temp (!) 97.5 F (36.4 C) (Oral)    Ht 5' 8.5" (1.74 m)    Wt 255 lb (115.7 kg)    SpO2 97%    BMI 38.20 kg/m    Subjective:    Patient ID: Joshua Lee, male    DOB: 01/20/1965, 54 y.o.   MRN: [id number]  HPI: Joshua BravoCharles T Lawther is a 54 y.o. male presenting on 03/06/2019 for comprehensive medical examination. Current medical complaints include:  HYPERTENSION / HYPERLIPIDEMIA Satisfied with current treatment? yes Duration of hypertension: chronic BP monitoring frequency: not checking BP medication side effects: no Past BP meds: metoprolol, HCTZ, benazepril Duration of hyperlipidemia: chronic Cholesterol medication side effects: no Cholesterol supplements: none Past cholesterol medications: atorvastatin, fenofibrate Medication compliance: excellent compliance Aspirin: yes Recent stressors: no Recurrent headaches: no Visual changes: no Palpitations: no Dyspnea: no Chest pain: no Lower extremity edema: no Dizzy/lightheaded: no  DIABETES Hypoglycemic episodes:no Polydipsia/polyuria: yes Visual disturbance: no Chest pain: no Paresthesias: no Glucose Monitoring: no  Accucheck frequency: Not Checking Taking Insulin?: no Blood Pressure Monitoring: not checking Retinal Examination: Up to Date Foot Exam: Done today Diabetic Education: Completed Pneumovax: Up to Date Influenza: Up to Date Aspirin: yes  Interim Problems from his last visit: no  Depression Screen done today and results listed below:  Dep[facility] screen [facility] 03/06/2019 02/28/2018 05/10/2017 02/09/2017 08/10/2016  Decreased Interest 0 0 0 0 0  Down, Depressed, Hopeless 0 0 0 0 0  PHQ - 2 Score 0 0 0 0 0  Altered sleeping 0 0 - - -  Tired, decreased energy 0 0 - - -  Change in appetite 0 0 - - -  Feeling bad or failure about yourself  0 0 - - -  Trouble concentrating 0 0 - - -  Moving slowly or fidgety/restless  0 0 - - -  Suicidal thoughts 0 0 - - -  PHQ-9 Score 0 0 - - -  Difficult doing work/chores Not difficult at all - - - -    Past Medical History:  Past Medical History:  Diagnosis Date   Diabetes mellitus without complication (HCC)    GERD (gastroesophageal reflux disease)    Hyperlipidemia    Hypertension    Kidney stones    Plantar fascial fibromatosis     Surgical History:  Past Surgical History:  Procedure Laterality Date   CHOLECYSTECTOMY  2006   GALLBLADDER SURGERY     KIDNEY STONE SURGERY     KNEE ARTHROSCOPY     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   aspirin 81 MG tablet Take 81 mg by mouth daily.   BAYER CONTOUR NEXT TEST test strip use to CHECK BLOOD SUGAR twice a day   BAYER MICROLET LANCETS lancets 1 each by Other route See admin instructions. for testing   calcium carbonate (TUMS EX) 750 MG chewable tablet Chew 2 tablets by mouth daily as needed for heartburn.    cetirizine (ZYRTEC) 10 MG tablet Take 10 mg by mouth daily as needed for allergies.  Coenzyme Q-10 100 MG capsule Take 100 mg by mouth daily.   Garlic 1000 MG CAPS Take 1,000 mg by mouth daily.   Miconazole Nitrate (LOTRIMIN AF) 2 % AERO Apply 1 application topically daily as needed.   Potassium Citrate 15 MEQ (1620 MG) TBCR Take 15 mEq by mouth 2 (two) times daily.    tolnaftate (TINACTIN) 1 % spray Apply 1 application topically as needed.   VASCEPA 1 g capsule Take 2 g by mouth 2 (two) times daily.   No current facility-administered medications on file prior to visit.    Allergies:  Allergies  Allergen Reactions   Penicillins  Rash    Has patient had a PCN reaction causing immediate rash, facial/tongue/throat swelling, SOB or lightheadedness with hypotension: unknown Has patient had a PCN reaction causing severe rash involving mucus membranes or skin necrosis: unknown Has patient had a PCN reaction that required hospitalization: unknown Has patient had a PCN reaction occurring within the last 10 years: no If all of the above answers are "NO", then may proceed with Cephalosporin use.     Social History:  Social History   Socioeconomic History   Marital status: Single    Spouse name: Not on file   Number of children: Not on file   Years of education: Not on file   Highest education level: Not on file  Occupational History   Not on file  Tobacco Use   Smoking status: Never Smoker   Smokeless tobacco: Current User    Types: Snuff  Substance and Sexual Activity   Alcohol use: Yes    Alcohol/week: 0.0 standard drinks    Comment: Occasionally drinks beer   Drug use: No   Sexual activity: Not on file  Other Topics Concern   Not on file  Social History Narrative   Not on file   Social Determinants of Health   Financial Resource Strain:    Difficulty of Paying Living Expenses: Not on file  Food Insecurity:    Worried About Running Out of Food in the Last Year: Not on file   Ran Out of Food in the Last Year: Not on file  Transportation Needs:    Lack of Transportation (Medical): Not on file   Lack of Transportation (Non-Medical): Not on file  Physical Activity:    Days of Exercise per Week: Not on file   Minutes of Exercise per Session: Not on file  Stress:    Feeling of Stress : Not on file  Social Connections:    Frequency of Communication with Friends and Family: Not on file   Frequency of Social Gatherings with Friends and Family: Not on file   Attends Religious Services: Not on file   Active Member of Clubs or Organizations: Not on file   Attends Tax inspector Meetings: Not on file   Marital Status: Not on file  Intimate Partner Violence:    Fear of Current or Ex-Partner: Not on file   Emotionally Abused: Not on file   Physically Abused: Not on file   Sexually Abused: Not on file   Social History   Tobacco Use  Smoking Status Never Smoker  Smokeless Tobacco Current User   Types: Snuff   Social History   Substance and Sexual Activity  Alcohol Use Yes   Alcohol/week: 0.0 standard drinks   Comment: Occasionally drinks beer    Family History:  Family History  Problem Relation Age of Onset   Diabetes Maternal Uncle  Past medical history, surgical history, medications, allergies, family history and social history reviewed with patient today and changes made to appropriate areas of the chart.   Review of Systems  Constitutional: Negative.   HENT: Negative.   Eyes: Negative.   Respiratory: Negative.   Cardiovascular: Negative.   Gastrointestinal: Negative.   Genitourinary: Positive for frequency. Negative for dysuria, flank pain, hematuria and urgency.  Musculoskeletal: Negative.   Skin: Negative.   Neurological: Negative.   Endo/Heme/Allergies: Positive for polydipsia. Negative for environmental allergies. Does not bruise/bleed easily.  Psychiatric/Behavioral: Negative.     All other ROS negative except what is listed above and in the HPI.      Objective:    BP 134/77 (BP Location: Left Arm, Patient Position: Sitting, Cuff Size: Normal)    Pulse 81    Temp (!) 97.5 F (36.4 C) (Oral)    Ht 5' 8.5" (1.74 m)    Wt 255 lb (115.7 kg)    SpO2 97%    BMI 38.20 kg/m   Wt Readings from Last 3 Encounters:  03/06/19 255 lb (115.7 kg)  08/29/18 253 lb (114.8 kg)  02/28/18 255 lb 6.4 oz (115.8 kg)    Physical Exam Vitals and nursing note reviewed.  Constitutional:      General: He is not in acute distress.    Appearance: Normal appearance. He is obese. He is not ill-appearing, toxic-appearing or  diaphoretic.  HENT:     Head: Normocephalic and atraumatic.     Right Ear: Tympanic membrane, ear canal and external ear normal. There is no impacted cerumen.     Left Ear: Tympanic membrane, ear canal and external ear normal. There is no impacted cerumen.     Nose: Nose normal. No congestion or rhinorrhea.     Mouth/Throat:     Mouth: Mucous membranes are moist.     Pharynx: Oropharynx is clear. No oropharyngeal exudate or posterior oropharyngeal erythema.  Eyes:     General: No scleral icterus.       Right eye: No discharge.        Left eye: No discharge.     Extraocular Movements: Extraocular movements intact.     Conjunctiva/sclera: Conjunctivae normal.     Pupils: Pupils are equal, round, and reactive to light.  Neck:     Vascular: No carotid bruit.  Cardiovascular:     Rate and Rhythm: Normal rate and regular rhythm.     Pulses: Normal pulses.     Heart sounds: No murmur. No friction rub. No gallop.   Pulmonary:     Effort: Pulmonary effort is normal. No respiratory distress.     Breath sounds: Normal breath sounds. No stridor. No wheezing, rhonchi or rales.  Chest:     Chest wall: No tenderness.  Abdominal:     General: Abdomen is flat. Bowel sounds are normal. There is no distension.     Palpations: Abdomen is soft. There is no mass.     Tenderness: There is no abdominal tenderness. There is no right CVA tenderness, left CVA tenderness, guarding or rebound.     Hernia: No hernia is present.  Genitourinary:    Comments: Genital exam deferred with shared decision making Musculoskeletal:        General: No swelling, tenderness, deformity or signs of injury.     Cervical back: Normal range of motion and neck supple. No rigidity. No muscular tenderness.     Right lower leg: No edema.     Left  lower leg: No edema.  Lymphadenopathy:     Cervical: No cervical adenopathy.  Skin:    General: Skin is warm and dry.     Capillary Refill: Capillary refill takes less than 2  seconds.     Coloration: Skin is not jaundiced or pale.     Findings: No bruising, erythema, lesion or rash.  Neurological:     General: No focal deficit present.     Mental Status: He is alert and oriented to person, place, and time.     Cranial Nerves: No cranial nerve deficit.     Sensory: No sensory deficit.     Motor: No weakness.     Coordination: Coordination normal.     Gait: Gait normal.     Deep Tendon Reflexes: Reflexes normal.  Psychiatric:        Mood and Affect: Mood normal.        Behavior: Behavior normal.        Thought Content: Thought content normal.        Judgment: Judgment normal.     Results for orders placed or performed in visit on 03/06/19  Bayer DCA Hb A1c Waived  Result Value Ref Range   HB A1C (BAYER DCA - WAIVED) 8.6 (H) <7.0 %  Microalbumin, Urine Waived  Result Value Ref Range   Microalb, Ur Waived 10 0 - 19 mg/L   Creatinine, Urine Waived 10 10 - 300 mg/dL   Microalb/Creat Ratio <30 <30 mg/g  UA/M w/rflx Culture, Routine   Specimen: Blood   BLD  Result Value Ref Range   Specific Gravity, UA 1.010 1.005 - 1.030   pH, UA 5.0 5.0 - 7.5   Color, UA Yellow Yellow   Appearance Ur Clear Clear   Leukocytes,UA Negative Negative   Protein,UA Negative Negative/Trace   Glucose, UA 3+ (A) Negative   Ketones, UA Trace (A) Negative   RBC, UA Negative Negative   Bilirubin, UA Negative Negative   Urobilinogen, Ur 0.2 0.2 - 1.0 mg/dL   Nitrite, UA Negative Negative      Assessment & Plan:   Problem List Items Addressed This Visit      Cardiovascular and Mediastinum   Hypertension    Under good control on current regimen. Continue current regimen. Continue to monitor. Call with any concerns. Refills given. Labs drawn today.       Relevant Medications   VASCEPA 1 g capsule   metoprolol tartrate (LOPRESSOR) 100 MG tablet   hydrochlorothiazide (MICROZIDE) 12.5 MG capsule   fenofibrate (TRICOR) 145 MG tablet   empagliflozin (JARDIANCE) 25 MG  TABS tablet   benazepril (LOTENSIN) 40 MG tablet   atorvastatin (LIPITOR) 80 MG tablet   Other Relevant Orders   CBC with Differential/Platelet   Comprehensive metabolic panel   Microalbumin, Urine Waived (Completed)   Obesity, diabetes, and hypertension syndrome (HCC)   Relevant Medications   VASCEPA 1 g capsule   metoprolol tartrate (LOPRESSOR) 100 MG tablet   metFORMIN (GLUCOPHAGE) 500 MG tablet   hydrochlorothiazide (MICROZIDE) 12.5 MG capsule   fenofibrate (TRICOR) 145 MG tablet   empagliflozin (JARDIANCE) 25 MG TABS tablet   benazepril (LOTENSIN) 40 MG tablet   atorvastatin (LIPITOR) 80 MG tablet   Semaglutide,0.25 or 0.5MG /DOS, (OZEMPIC, 0.25 OR 0.5 MG/DOSE,) 2 MG/1.5ML SOPN   Other Relevant Orders   CBC with Differential/Platelet   Comprehensive metabolic panel     Endocrine   Type 2 diabetes mellitus with stage 3 chronic kidney disease, without long-term current  use of insulin (HCC)    Not under good control with A1c of 8.6. Will change from [place] to ozempic and recheck in 1 month for tolerance. Call with any concerns. Continue to monitor.       Relevant Medications   metFORMIN (GLUCOPHAGE) 500 MG tablet   empagliflozin (JARDIANCE) 25 MG TABS tablet   benazepril (LOTENSIN) 40 MG tablet   atorvastatin (LIPITOR) 80 MG tablet   Semaglutide,0.25 or 0.5MG /DOS, (OZEMPIC, 0.25 OR 0.5 MG/DOSE,) 2 MG/1.5ML SOPN   Other Relevant Orders   Bayer DCA Hb A1c Waived (Completed)   CBC with Differential/Platelet   Comprehensive metabolic panel   Microalbumin, Urine Waived (Completed)     Genitourinary   CKD (chronic kidney disease), stage II    Rechecking labs today. Await results. Call with any concerns.       Relevant Orders   CBC with Differential/Platelet   Comprehensive metabolic panel   UA/M w/rflx Culture, Routine (Completed)     Other   Hyperlipidemia    Under good control on current regimen. Continue current regimen. Continue to monitor. Call with any concerns.  Refills given. Labs drawn today.      Relevant Medications   VASCEPA 1 g capsule   metoprolol tartrate (LOPRESSOR) 100 MG tablet   hydrochlorothiazide (MICROZIDE) 12.5 MG capsule   fenofibrate (TRICOR) 145 MG tablet   benazepril (LOTENSIN) 40 MG tablet   atorvastatin (LIPITOR) 80 MG tablet   Other Relevant Orders   CBC with Differential/Platelet   Comprehensive metabolic panel   Lipid Panel w/o Chol/HDL Ratio   Morbid obesity (HCC)    Discussed working on diet and exercise with goal of losing 1-2lbs per week.       Relevant Medications   metFORMIN (GLUCOPHAGE) 500 MG tablet   empagliflozin (JARDIANCE) 25 MG TABS tablet   Semaglutide,0.25 or 0.5MG /DOS, (OZEMPIC, 0.25 OR 0.5 MG/DOSE,) 2 MG/1.5ML SOPN   Other Relevant Orders   CBC with Differential/Platelet   Comprehensive metabolic panel   TSH    Other Visit Diagnoses    Routine general medical examination at a health care facility    -  Primary   Vaccines up to date. Continue diet and exercise. Cologuard ordered today. Continue to monitor. Call with any concerns.    Screening for prostate cancer       Labs drawn today. Await results.    Relevant Orders   PSA   Screening for colon cancer       Labs drawn today. Await results.    Relevant Orders   Cologuard       Discussed aspirin prophylaxis for myocardial infarction prevention and decision was made to continue ASA  LABORATORY TESTING:  Health maintenance labs ordered today as discussed above.   The natural history of prostate cancer and ongoing controversy regarding screening and potential treatment outcomes of prostate cancer has been discussed with the patient. The meaning of a false positive PSA and a false negative PSA has been discussed. He indicates understanding of the limitations of this screening test and wishes to proceed with screening PSA testing.   IMMUNIZATIONS:   - Tdap: Tetanus vaccination status reviewed: last tetanus booster within 10 years. -  Influenza: Up to date - Pneumovax: Up to date  SCREENING: - Colonoscopy: will do cologuard  Discussed with patient purpose of the colonoscopy is to detect colon cancer at curable precancerous or early stages   PATIENT COUNSELING:    Sexuality: Discussed sexually transmitted diseases, partner selection, use of condoms,  avoidance of unintended pregnancy  and contraceptive alternatives.   Advised to avoid cigarette smoking.  I discussed with the patient that most people either abstain from alcohol or drink within safe limits (<=14/week and <=4 drinks/occasion for males, <=7/weeks and <= 3 drinks/occasion for females) and that the risk for alcohol disorders and other health effects rises proportionally with the number of drinks per week and how often a drinker exceeds daily limits.  Discussed cessation/primary prevention of drug use and availability of treatment for abuse.   Diet: Encouraged to adjust caloric intake to maintain  or achieve ideal body weight, to reduce intake of dietary saturated fat and total fat, to limit sodium intake by avoiding high sodium foods and not adding table salt, and to maintain adequate dietary potassium and calcium preferably from fresh fruits, vegetables, and low-fat dairy products.    stressed the importance of regular exercise  Injury prevention: Discussed safety belts, safety helmets, smoke detector, smoking near bedding or upholstery.   Dental health: Discussed importance of regular tooth brushing, flossing, and dental visits.   Follow up plan: NEXT PREVENTATIVE PHYSICAL DUE IN 1 YEAR. Return in about 4 weeks (around 04/03/2019) for follow up tolerance on ozempic.

## 2019-03-06 NOTE — Assessment & Plan Note (Signed)
Rechecking labs today. Await results. Call with any concerns.  

## 2019-03-06 NOTE — Assessment & Plan Note (Signed)
Discussed working on diet and exercise with goal of losing 1-2lbs per week.

## 2019-03-07 ENCOUNTER — Telehealth: Payer: Self-pay

## 2019-03-07 LAB — CBC WITH DIFFERENTIAL/PLATELET
Basophils Absolute: 0 10*3/uL (ref 0.0–0.2)
Basos: 0 %
EOS (ABSOLUTE): 0.1 10*3/uL (ref 0.0–0.4)
Eos: 1 %
Hematocrit: 43.8 % (ref 37.5–51.0)
Hemoglobin: 14 g/dL (ref 13.0–17.7)
Immature Grans (Abs): 0 10*3/uL (ref 0.0–0.1)
Immature Granulocytes: 1 %
Lymphocytes Absolute: 0.9 10*3/uL (ref 0.7–3.1)
Lymphs: 14 %
MCH: 26.5 pg — ABNORMAL LOW (ref 26.6–33.0)
MCHC: 32 g/dL (ref 31.5–35.7)
MCV: 83 fL (ref 79–97)
Monocytes Absolute: 0.5 10*3/uL (ref 0.1–0.9)
Monocytes: 7 %
Neutrophils Absolute: 5.2 10*3/uL (ref 1.4–7.0)
Neutrophils: 77 %
Platelets: 204 10*3/uL (ref 150–450)
RBC: 5.29 x10E6/uL (ref 4.14–5.80)
RDW: 15.7 % — ABNORMAL HIGH (ref 11.6–15.4)
WBC: 6.7 10*3/uL (ref 3.4–10.8)

## 2019-03-07 LAB — TSH: TSH: 1.69 u[IU]/mL (ref 0.450–4.500)

## 2019-03-07 LAB — LIPID PANEL W/O CHOL/HDL RATIO
Cholesterol, Total: 119 mg/dL (ref 100–199)
HDL: 26 mg/dL — ABNORMAL LOW
LDL Chol Calc (NIH): 49 mg/dL (ref 0–99)
Triglycerides: 283 mg/dL — ABNORMAL HIGH (ref 0–149)
VLDL Cholesterol Cal: 44 mg/dL — ABNORMAL HIGH (ref 5–40)

## 2019-03-07 LAB — COMPREHENSIVE METABOLIC PANEL WITH GFR
ALT: 25 [IU]/L (ref 0–44)
AST: 24 [IU]/L (ref 0–40)
Albumin/Globulin Ratio: 1.6 (ref 1.2–2.2)
Albumin: 4.2 g/dL (ref 3.8–4.9)
Alkaline Phosphatase: 51 [IU]/L (ref 39–117)
BUN/Creatinine Ratio: 20 (ref 9–20)
BUN: 19 mg/dL (ref 6–24)
Bilirubin Total: 0.7 mg/dL (ref 0.0–1.2)
CO2: 21 mmol/L (ref 20–29)
Calcium: 9 mg/dL (ref 8.7–10.2)
Chloride: 96 mmol/L (ref 96–106)
Creatinine, Ser: 0.95 mg/dL (ref 0.76–1.27)
GFR calc Af Amer: 105 mL/min/{1.73_m2}
GFR calc non Af Amer: 91 mL/min/{1.73_m2}
Globulin, Total: 2.6 g/dL (ref 1.5–4.5)
Glucose: 275 mg/dL — ABNORMAL HIGH (ref 65–99)
Potassium: 4.3 mmol/L (ref 3.5–5.2)
Sodium: 136 mmol/L (ref 134–144)
Total Protein: 6.8 g/dL (ref 6.0–8.5)

## 2019-03-07 LAB — PSA: Prostate Specific Ag, Serum: 0.4 ng/mL (ref 0.0–4.0)

## 2019-03-07 NOTE — Telephone Encounter (Signed)
Prior Authorization initiated via CoverMyMeds for Jardiance 25MG  tablets Key: [facility]

## 2019-03-08 NOTE — Telephone Encounter (Signed)
No PA required

## 2019-03-09 ENCOUNTER — Encounter: Payer: Self-pay | Admitting: Family Medicine

## 2019-03-15 LAB — COLOGUARD: Cologuard: NEGATIVE

## 2019-03-15 LAB — HM COLONOSCOPY

## 2019-03-21 LAB — COLOGUARD: COLOGUARD: NEGATIVE

## 2019-04-02 ENCOUNTER — Telehealth: Payer: Self-pay | Admitting: Family Medicine

## 2019-04-02 NOTE — Telephone Encounter (Signed)
Patient would like to know should he take ozempic before or after eating? Or does it matter CB- [phone]

## 2019-04-02 NOTE — Telephone Encounter (Signed)
Routing to provider to advise.  

## 2019-04-03 ENCOUNTER — Other Ambulatory Visit: Payer: Self-pay

## 2019-04-03 ENCOUNTER — Ambulatory Visit (INDEPENDENT_AMBULATORY_CARE_PROVIDER_SITE_OTHER): Payer: BLUE CROSS/BLUE SHIELD | Admitting: Family Medicine

## 2019-04-03 ENCOUNTER — Encounter: Payer: Self-pay | Admitting: Family Medicine

## 2019-04-03 ENCOUNTER — Telehealth: Payer: Self-pay | Admitting: Family Medicine

## 2019-04-03 VITALS — BP 129/74 | HR 77 | Temp 97.7°F

## 2019-04-03 DIAGNOSIS — E1122 Type 2 diabetes mellitus with diabetic chronic kidney disease: Secondary | ICD-10-CM

## 2019-04-03 DIAGNOSIS — N183 Chronic kidney disease, stage 3 unspecified: Secondary | ICD-10-CM

## 2019-04-03 DIAGNOSIS — Z2883 Immunization not carried out due to unavailability of vaccine: Secondary | ICD-10-CM

## 2019-04-03 MED ORDER — OZEMPIC (0.25 OR 0.5 MG/DOSE) 2 MG/1.5ML ~~LOC~~ SOPN: 0.5000 mg | PEN_INJECTOR | SUBCUTANEOUS | 3 refills | Status: DC

## 2019-04-03 NOTE — Progress Notes (Signed)
BP 129/74 (BP Location: Left Arm, Patient Position: Sitting, Cuff Size: Normal)   Pulse 77   Temp 97.7 F (36.5 C) (Oral)   SpO2 97%    Subjective:    Patient ID: Joshua Lee, male    DOB: 1966-01-08, 54 y.o.   MRN: [id number]  HPI: Joshua Lee is a 54 y.o. male  Chief Complaint  Patient presents with  . Diabetes   DIABETES- started on ozempic feeling well.  Hypoglycemic episodes:no Polydipsia/polyuria: yes Visual disturbance: no Chest pain: no Paresthesias: no Glucose Monitoring: no  Accucheck frequency: Not Checking Taking Insulin?: no Blood Pressure Monitoring: not checking Retinal Examination: Up to Date Foot Exam: Up to Date Diabetic Education: Not Completed Pneumovax: Up to Date Influenza: Up to Date Aspirin: yes  Relevant past medical, surgical, family and social history reviewed and updated as indicated. Interim medical history since our last visit reviewed. Allergies and medications reviewed and updated.  Review of Systems  Constitutional: Negative.   Respiratory: Negative.   Cardiovascular: Negative.   Musculoskeletal: Negative.   Neurological: Negative.   Psychiatric/Behavioral: Negative.     Per HPI unless specifically indicated above     Objective:    BP 129/74 (BP Location: Left Arm, Patient Position: Sitting, Cuff Size: Normal)   Pulse 77   Temp 97.7 F (36.5 C) (Oral)   SpO2 97%   Wt Readings from Last 3 Encounters:  03/06/19 255 lb (115.7 kg)  08/29/18 253 lb (114.8 kg)  02/28/18 255 lb 6.4 oz (115.8 kg)    Physical Exam Vitals and nursing note reviewed.  Constitutional:      General: He is not in acute distress.    Appearance: Normal appearance. He is not ill-appearing, toxic-appearing or diaphoretic.  HENT:     Head: Normocephalic and atraumatic.     Right Ear: External ear normal.     Left Ear: External ear normal.     Nose: Nose normal.     Mouth/Throat:     Mouth: Mucous membranes are moist.     Pharynx:  Oropharynx is clear.  Eyes:     General: No scleral icterus.       Right eye: No discharge.        Left eye: No discharge.     Extraocular Movements: Extraocular movements intact.     Conjunctiva/sclera: Conjunctivae normal.     Pupils: Pupils are equal, round, and reactive to light.  Cardiovascular:     Rate and Rhythm: Normal rate and regular rhythm.     Pulses: Normal pulses.     Heart sounds: Normal heart sounds. No murmur. No friction rub. No gallop.   Pulmonary:     Effort: Pulmonary effort is normal. No respiratory distress.     Breath sounds: Normal breath sounds. No stridor. No wheezing, rhonchi or rales.  Chest:     Chest wall: No tenderness.  Musculoskeletal:        General: Normal range of motion.     Cervical back: Normal range of motion and neck supple.  Skin:    General: Skin is warm and dry.     Capillary Refill: Capillary refill takes less than 2 seconds.     Coloration: Skin is not jaundiced or pale.     Findings: No bruising, erythema, lesion or rash.  Neurological:     General: No focal deficit present.     Mental Status: He is alert and oriented to person, place, and time. Mental status is at  baseline.  Psychiatric:        Mood and Affect: Mood normal.        Behavior: Behavior normal.        Thought Content: Thought content normal.        Judgment: Judgment normal.     Results for orders placed or performed in visit on 03/06/19  Bayer DCA Hb A1c Waived  Result Value Ref Range   HB A1C (BAYER DCA - WAIVED) 8.6 (H) <7.0 %  CBC with Differential/Platelet  Result Value Ref Range   WBC 6.7 3.4 - 10.8 x10E3/uL   RBC 5.29 4.14 - 5.80 x10E6/uL   Hemoglobin 14.0 13.0 - 17.7 g/dL   Hematocrit 43.8 37.5 - 51.0 %   MCV 83 79 - 97 fL   MCH 26.5 (L) 26.6 - 33.0 pg   MCHC 32.0 31.5 - 35.7 g/dL   RDW 15.7 (H) 11.6 - 15.4 %   Platelets 204 150 - 450 x10E3/uL   Neutrophils 77 Not Estab. %   Lymphs 14 Not Estab. %   Monocytes 7 Not Estab. %   Eos 1 Not Estab.  %   Basos 0 Not Estab. %   Neutrophils Absolute 5.2 1.4 - 7.0 x10E3/uL   Lymphocytes Absolute 0.9 0.7 - 3.1 x10E3/uL   Monocytes Absolute 0.5 0.1 - 0.9 x10E3/uL   EOS (ABSOLUTE) 0.1 0.0 - 0.4 x10E3/uL   Basophils Absolute 0.0 0.0 - 0.2 x10E3/uL   Immature Granulocytes 1 Not Estab. %   Immature Grans (Abs) 0.0 0.0 - 0.1 x10E3/uL  Comprehensive metabolic panel  Result Value Ref Range   Glucose 275 (H) 65 - 99 mg/dL   BUN 19 6 - 24 mg/dL   Creatinine, Ser 0.95 0.76 - 1.27 mg/dL   GFR calc non Af Amer 91 >59 mL/min/1.73   GFR calc Af Amer 105 >59 mL/min/1.73   BUN/Creatinine Ratio 20 9 - 20   Sodium 136 134 - 144 mmol/L   Potassium 4.3 3.5 - 5.2 mmol/L   Chloride 96 96 - 106 mmol/L   CO2 21 20 - 29 mmol/L   Calcium 9.0 8.7 - 10.2 mg/dL   Total Protein 6.8 6.0 - 8.5 g/dL   Albumin 4.2 3.8 - 4.9 g/dL   Globulin, Total 2.6 1.5 - 4.5 g/dL   Albumin/Globulin Ratio 1.6 1.2 - 2.2   Bilirubin Total 0.7 0.0 - 1.2 mg/dL   Alkaline Phosphatase 51 39 - 117 IU/L   AST 24 0 - 40 IU/L   ALT 25 0 - 44 IU/L  Lipid Panel w/o Chol/HDL Ratio  Result Value Ref Range   Cholesterol, Total 119 100 - 199 mg/dL   Triglycerides 283 (H) 0 - 149 mg/dL   HDL 26 (L) >39 mg/dL   VLDL Cholesterol Cal 44 (H) 5 - 40 mg/dL   LDL Chol Calc (NIH) 49 0 - 99 mg/dL  Microalbumin, Urine Waived  Result Value Ref Range   Microalb, Ur Waived 10 0 - 19 mg/L   Creatinine, Urine Waived 10 10 - 300 mg/dL   Microalb/Creat Ratio <30 <30 mg/g  PSA  Result Value Ref Range   Prostate Specific Ag, Serum 0.4 0.0 - 4.0 ng/mL  TSH  Result Value Ref Range   TSH 1.690 0.450 - 4.500 uIU/mL  UA/M w/rflx Culture, Routine   Specimen: Blood   BLD  Result Value Ref Range   Specific Gravity, UA 1.010 1.005 - 1.030   pH, UA 5.0 5.0 - 7.5   Color,  UA Yellow Yellow   Appearance Ur Clear Clear   Leukocytes,UA Negative Negative   Protein,UA Negative Negative/Trace   Glucose, UA 3+ (A) Negative   Ketones, UA Trace (A) Negative    RBC, UA Negative Negative   Bilirubin, UA Negative Negative   Urobilinogen, Ur 0.2 0.2 - 1.0 mg/dL   Nitrite, UA Negative Negative      Assessment & Plan:   Problem List Items Addressed This Visit      Endocrine   Type 2 diabetes mellitus with stage 3 chronic kidney disease, without long-term current use of insulin (HCC) - Primary    Tolerating his medicine well. Has not been checking his sugars. Will get him diabetic education and will get him help with cost of medicine through CCM- referral generated today. Labs reviewed with patient. Numerous questions answered.       Relevant Medications   Semaglutide,0.25 or 0.5MG /DOS, (OZEMPIC, 0.25 OR 0.5 MG/DOSE,) 2 MG/1.5ML SOPN   Other Relevant Orders   Basic metabolic panel   Referral to Chronic Care Management Services    Other Visit Diagnoses    COVID-19 virus vaccine not available       Long discussion of risks/benefits of COVID vaccine discussed and appointment for vaccine facilitated as he does not have a computer.        Follow up plan: Return in about 2 months (around 06/03/2019).  >25 minutes spent in counseling and coordination of care with patient

## 2019-04-03 NOTE — Telephone Encounter (Signed)
It doesn't matter- just the same day every week

## 2019-04-03 NOTE — Chronic Care Management (AMB) (Signed)
  Care Management   Note  04/03/2019 Name: Joshua Lee MRN: [id number] DOB: [birth year]  Joshua Lee is a 54 y.o. year old male who is a primary care patient of [name], DO. I reached out to [name] by phone today in response to a referral sent by [name] health plan.    [name] was given information about care management services today including:  1. Care management services include personalized support from designated clinical staff supervised by his physician, including individualized plan of care and coordination with other care providers 2. 24/7 contact phone numbers for assistance for urgent and routine care needs. 3. The patient may stop care management services at any time by phone call to the office staff.  Patient agreed to services and verbal consent obtained.   Follow up plan: Telephone appointment with care management team member scheduled for:05/11/2019  Elisha Ponder, LPN Health Advisor, Embedded Care Coordination [facility] Health Care Management ??[email or web address] ??[phone]

## 2019-04-03 NOTE — Patient Instructions (Signed)
We are recommending the vaccine to everyone who has not had an allergic reaction to any of the components of the vaccine. If you have specific questions about the vaccine, please bring them up with your health care provider to discuss them.   We will likely not be getting the vaccine in the office for the first rounds of vaccinations. The way they are releasing the vaccines is going to be through the health systems (like [place], [place], [facility], [facility]), through your county health department, or through the pharmacies.   The [facility] Department is giving vaccines to those 65+ and Health Care Workers Teachers and Child Care providers start 03/14/19, Essential workers start 3/10 and those with co-morbidities start 04/04/19 Call [phone] to schedule  If you are 65+ you can get a vaccine through [facility] by signing up for an appointment.  You can sign up by going to: [email or web address].  You can get more information by going to: [email or web address]  [employer] next door is giving the [employer]- you can call [phone] or stop by there to schedule.

## 2019-04-03 NOTE — Telephone Encounter (Signed)
Patient notified

## 2019-04-03 NOTE — Chronic Care Management (AMB) (Signed)
  Care Management   Outreach Note  04/03/2019 Name: Joshua Lee MRN: [id number] DOB: 07/10/1965  Joshua Lee is a 54 y.o. year old male who is a primary care patient of [name], DO. I reached out to [employer] by phone today in response to a referral sent by Joshua Lee PCP, Olevia Perches DO     An unsuccessful telephone outreach was attempted today. The patient was referred to the case management team for assistance with care management and care coordination.   Follow Up Plan: A HIPPA compliant phone message was left for the patient providing contact information and requesting a return call.  The care management team will reach out to the patient again over the next 7 days.  If patient returns call to provider office, please advise to call Embedded Care Management Care Guide Elisha Ponder LPN at [phone]  Elisha Ponder, LPN Health Advisor, Embedded Care Coordination [facility] Health Care Management ??[email or web address] ??[phone]

## 2019-04-03 NOTE — Assessment & Plan Note (Signed)
Tolerating his medicine well. Has not been checking his sugars. Will get him diabetic education and will get him help with cost of medicine through CCM- referral generated today. Labs reviewed with patient. Numerous questions answered.

## 2019-04-04 LAB — BASIC METABOLIC PANEL
BUN/Creatinine Ratio: 15 (ref 9–20)
BUN: 16 mg/dL (ref 6–24)
CO2: 24 mmol/L (ref 20–29)
Calcium: 9.5 mg/dL (ref 8.7–10.2)
Chloride: 98 mmol/L (ref 96–106)
Creatinine, Ser: 1.06 mg/dL (ref 0.76–1.27)
GFR calc Af Amer: 92 mL/min/{1.73_m2} (ref >59)
GFR calc non Af Amer: 80 mL/min/{1.73_m2} (ref >59)
Glucose: 236 mg/dL — ABNORMAL HIGH (ref 65–99)
Potassium: 4.4 mmol/L (ref 3.5–5.2)
Sodium: 138 mmol/L (ref 134–144)

## 2019-04-05 ENCOUNTER — Other Ambulatory Visit: Payer: Self-pay

## 2019-04-05 NOTE — Telephone Encounter (Signed)
Will do!  Patient wondering about his lab result from 04/03/2019. Patient says it's web released, he just wants to know what it means.

## 2019-04-05 NOTE — Telephone Encounter (Signed)
We checked on cologuard at his appointment on 3/16 and it was not available yet.

## 2019-04-05 NOTE — Telephone Encounter (Signed)
Please let him know that his cologuard and labs were normal. Thanks!

## 2019-04-05 NOTE — Telephone Encounter (Signed)
Looked into Cologuard. Was resulted Negative 03/15/19.  Will print off order to put in chart. Result abstracted.

## 2019-04-05 NOTE — Telephone Encounter (Signed)
Patient notified of lab results

## 2019-04-05 NOTE — Telephone Encounter (Signed)
Likely will be changing dose. 90 days not appropriate. Just started on the medicine.

## 2019-04-05 NOTE — Telephone Encounter (Signed)
Spoke with patient about why we can't do a 90 day supply. Patient understood.  Patient wondering about his lab results from 04/03/19. Routing to provider to advise.  Also wondering about Cologuard result. I will look into this.   Told patient I would give him a call back tomorrow 04/06/19

## 2019-04-05 NOTE — Telephone Encounter (Signed)
As below. Please let him know his labs were normal.

## 2019-04-05 NOTE — Telephone Encounter (Signed)
Tried calling both home and mobile number.  No answer, left voicemail on both numbers for patient to return call.

## 2019-04-05 NOTE — Telephone Encounter (Signed)
Patient requesting 90 day supply for Ozempic. Routing to provider to advise.

## 2019-04-09 ENCOUNTER — Telehealth: Payer: Self-pay

## 2019-04-09 NOTE — Telephone Encounter (Signed)
He should NOT be taking [place]. We changed to the ozempic shot.

## 2019-04-09 NOTE — Telephone Encounter (Signed)
Pharmacy notified.

## 2019-04-09 NOTE — Telephone Encounter (Signed)
Fax from pharmacy for refill request for Januvia.  Januvia is not in patient's medication list and was discontinued.  Looks like patient is taking Jardiance now.   Just to be certain before I contact the pharmacy, patient is NOT taking Januvia anymore. Correct?

## 2019-05-01 ENCOUNTER — Telehealth: Payer: Self-pay

## 2019-05-01 NOTE — Telephone Encounter (Signed)
PA for Jardiance initiated and submitted via Cover My Meds. Key: [id number]

## 2019-05-03 ENCOUNTER — Telehealth: Payer: Self-pay | Admitting: Family Medicine

## 2019-05-03 NOTE — Telephone Encounter (Signed)
Copied from CRM [id number]. Topic: General - Other >> May 03, 2019 11:18 AM Tamela Oddi wrote: Reason for CRM: Patient called to ask that the nurse call him regarding his prescription refills.  He stated that the pharmacy said they cannot refill his medications because the office has not authorized the order.  Please advise and call the patient to discuss at [phone]

## 2019-05-03 NOTE — Telephone Encounter (Signed)
Called and spoke with him. Patient stated that the pharmacy did not want to refill some of his medications. Called pharmacy and spoke with the pharmacist. Pharmacist stated that patient were able to refill his metformine and metoprolol. For the rest of his Rx, the pharmacist stated that it was to early to refill, insurance wont pay it. Pharmacist stated that he will have the benazepril and tamsulosin ready for patient but still patient has to wait until 4/21 to refill the atorvastatin. Patient notified.

## 2019-05-11 ENCOUNTER — Ambulatory Visit: Payer: BLUE CROSS/BLUE SHIELD | Admitting: Pharmacist

## 2019-05-11 DIAGNOSIS — N183 Chronic kidney disease, stage 3 unspecified: Secondary | ICD-10-CM

## 2019-05-11 DIAGNOSIS — E782 Mixed hyperlipidemia: Secondary | ICD-10-CM

## 2019-05-11 DIAGNOSIS — I10 Essential (primary) hypertension: Secondary | ICD-10-CM

## 2019-05-11 NOTE — Patient Instructions (Signed)
Visit Information  Goals Addressed            This Visit's Progress     Patient Stated   . PharmD "I want to understand my medications" (pt-stated)       [place] (see longtitudinal plan of care for additional care plan information)  Current Barriers:  . Diabetes: uncontrolled; complicated by chronic medical conditions including HTN, HLD, most recent A1c 8.6% o Reports he has a glucometer, but does not know how to use it o Notes he gets his 2nd COVID shot on the 20th  o Utilizing copay cards for [place], Ozempic, and Vascepa  . Most recent eGFR: 80 mL/min . Current antihyperglycemic regimen: metformin 1000 mg BID, Jardiance 25 mg daily QAM, Ozempic 0.25 mg weekly (has been on for ~6 weeks now, but didn't increase at 4 weeks) . Reports nocturia x1-2 times per night, though drinks a lot during the day . Current meal patterns: o Breakfast: Egg, bacon, wheat toast; sweat tea that his mother makes, he notes she uses smaller amounts of sugar o Lunch: usually skips because he eats breakfast late o Supper: Mom usually cooks, pork chops, green beans; sometimes baked chicken, beans; corn; salad  o Snacks: sometimes peanut butter pretzels, sometimes Pringles  o Drinks: lots of water, mostly sweet tea . Current exercise: Notes that he used to be a member of gym; knees and hips hurt him too much to walk much  . Current blood glucose readings: is not checking his blood sugars at home, does not know how to use  . Cardiovascular risk reduction: o Current hypertensive regimen: benazepril 40 mg daily QAM, HCTZ 12.5 mg QAM, metoprolol 100 mg BID; BP at goal <130/80 on last office check; is not checking at home o Current hyperlipidemia regimen: atorvastatin mg daily QPM, Vascepa 2 g BID; LDL at goal <70, TG was >250 prior to Vascepa start  o Current antiplatelet regimen: ASA 81 mg . Recurrent kidney stones: sees [facility]; potassium citrate 15 mEq BID, tamsulosin 0.4 mg daily   Pharmacist  Clinical Goal(s):  [name] Over the next 90 days, patient will work with PharmD and primary care provider to address optimized medication management  Interventions: . Comprehensive medication review performed, medication list updated in electronic medical record . Inter-disciplinary care team collaboration (see longitudinal plan of care) . Reviewed instruction to increase Ozempic to 0.5 mg weekly. He verbalizes understanding.  . Reviewed that Ozempic copay card reduces cost to $25 for 30 or 90 day. Once on a stable dose, we can switch to  . Encouraged to bring glucometer to the office when he sees Dr. Wynetta Emery next, and a team member can help teach how to use.  . Reviewed that decreased appetite is related to Ozempic, and is normal   Patient Self Care Activities:  . Patient will bring, document, and provide at future appointments . Patient will take medications as prescribed . Patient will report any questions or concerns to provider   Please see past updates related to this goal by clicking on the "Past Updates" button in the selected goal         Patient verbalizes understanding of instructions provided today.   Plan: - Will meet with patient face to face at next PCP visit for teaching  Catie Darnelle Maffucci, PharmD, [place] [phone]

## 2019-05-11 NOTE — Chronic Care Management (AMB) (Signed)
Chronic Care Management   Note  05/11/2019 Name: Joshua Lee MRN: [id number] DOB: 11-19-1965   Subjective:  Joshua Lee is a 54 y.o. year old male who is a primary care patient of [name], DO. The CCM team was consulted for assistance with chronic disease management and care coordination needs.    Contacted patient for medication management.   Review of patient status, including review of consultants reports, laboratory and other test data, was performed as part of comprehensive evaluation and provision of chronic care management services.   SDOH (Social Determinants of Health) assessments and interventions performed: no   Objective:  Lab Results  Component Value Date   CREATININE 1.06 04/03/2019   CREATININE 0.95 03/06/2019   CREATININE 1.00 12/05/2018    Lab Results  Component Value Date   HGBA1C 8.6 (H) 03/06/2019       Component Value Date/Time   CHOL 119 03/06/2019 1039   CHOL 108 08/29/2018 1045   TRIG 283 (H) 03/06/2019 1039   TRIG 221 (H) 08/29/2018 1045   HDL 26 (L) 03/06/2019 1039   CHOLHDL 6.5 (H) 02/09/2017 1002   VLDL 44 (H) 08/29/2018 1045   LDLCALC 49 03/06/2019 1039    Clinical ASCVD: No  The ASCVD Risk score [name], et al., 2013) failed to calculate for the following reasons:   The valid total cholesterol range is 130 to 320 mg/dL    BP Readings from Last 3 Encounters:  04/03/19 129/74  03/06/19 134/77  08/29/18 109/75    Allergies  Allergen Reactions  . Penicillins Rash    Has patient had a PCN reaction causing immediate rash, facial/tongue/throat swelling, SOB or lightheadedness with hypotension: unknown Has patient had a PCN reaction causing severe rash involving mucus membranes or skin necrosis: unknown Has patient had a PCN reaction that required hospitalization: unknown Has patient had a PCN reaction occurring within the last 10 years: no If all of the above answers are "NO", then may proceed with Cephalosporin  use.     Medications Reviewed Today    Reviewed by De Hollingshead, [facility] (Pharmacist) on 05/11/19 at 1313  Med List Status: <None>  Medication Order Taking? Sig Documenting Provider Last Dose Status Informant  acetaminophen (TYLENOL) 650 MG CR tablet [id number] Yes Take 650-1,950 mg by mouth every 8 (eight) hours as needed for pain. [provider] Taking Active Self  aspirin 81 MG tablet [id number] Yes Take 81 mg by mouth daily. [provider] Taking Active Self  atorvastatin (LIPITOR) 80 MG tablet [id number] Yes Take 1 tablet (80 mg total) by mouth at bedtime. Johnson, [name], DO Taking Active   benazepril (LOTENSIN) 40 MG tablet [id number] Yes Take 1 tablet (40 mg total) by mouth daily. Johnson, [name], DO Taking Active   calcium carbonate (TUMS EX) 750 MG chewable tablet [id number] Yes Chew 2 tablets by mouth daily as needed for heartburn.  [provider] Taking Active Self           Med Note Benancio Deeds Jun 30, 2015 10:16 AM)    cetirizine (ZYRTEC) 10 MG tablet [id number] Yes Take 10 mg by mouth daily as needed for allergies.  [provider] Taking Active Self           Med Note Benancio Deeds Jun 30, 2015 10:16 AM)    Coenzyme Q-10 100 MG capsule [id number] Yes Take 100 mg by mouth daily. [provider]  Taking Active Self  empagliflozin (JARDIANCE) 25 MG TABS tablet [id number] Yes Take 25 mg by mouth daily. Park Liter P, DO Taking Active   Garlic 6761 MG CAPS [id number] Yes Take 1,000 mg by mouth daily. [provider] Taking Active Self  hydrochlorothiazide (MICROZIDE) 12.5 MG capsule [id number] Yes Take 1 capsule (12.5 mg total) by mouth daily. Park Liter P, DO Taking Active   metFORMIN (GLUCOPHAGE) 500 MG tablet [id number] Yes Take 2 tablets (1,000 mg total) by mouth 2 (two) times daily with a meal. Wynetta Emery, [name], DO Taking Active   metoprolol tartrate (LOPRESSOR) 100 MG tablet [id number] Yes Take  1 tablet (100 mg total) by mouth 2 (two) times daily. Johnson, [name], DO Taking Active   Miconazole Nitrate (LOTRIMIN AF) 2 % AERO [id number]  Apply 1 application topically daily as needed. [provider]  Active   Potassium Citrate 15 MEQ (1620 MG) TBCR [id number] Yes Take 15 mEq by mouth 2 (two) times daily. 2 tab QAM, 1 tab QPM [provider] Taking Active Self           Med Note Benancio Deeds Jun 30, 2015 10:18 AM)    Semaglutide,0.25 or 0.5MG/DOS, (OZEMPIC, 0.25 OR 0.5 MG/DOSE,) 2 MG/1.5ML SOPN [id number] Yes Inject 0.5 mg into the skin once a week. On Tuesdays Johnson, [name], DO Taking Active   tamsulosin (FLOMAX) 0.4 MG CAPS capsule [id number] Yes Take 1 capsule (0.4 mg total) by mouth daily. Johnson, [name], DO Taking Active   tolnaftate (TINACTIN) 1 % spray [id number]  Apply 1 application topically as needed. [provider]  Active   VASCEPA 1 g capsule [id number] Yes Take 2 g by mouth 2 (two) times daily. [provider] Taking Active            Assessment:   Goals Addressed            This Visit's Progress     Patient Stated   . PharmD "I want to understand my medications" (pt-stated)       [place] (see longtitudinal plan of care for additional care plan information)  Current Barriers:  . Diabetes: uncontrolled; complicated by chronic medical conditions including HTN, HLD, most recent A1c 8.6% o Reports he has a glucometer, but does not know how to use it o Notes he gets his 2nd COVID shot on the 20th  o Utilizing copay cards for Imboden, Ozempic, and Vascepa  . Most recent eGFR: 80 mL/min . Current antihyperglycemic regimen: metformin 1000 mg BID, Jardiance 25 mg daily QAM, Ozempic 0.25 mg weekly (has been on for ~6 weeks now, but didn't increase at 4 weeks) . Reports nocturia x1-2 times per night, though drinks a lot during the day . Current meal patterns: o Breakfast: Egg, bacon, wheat toast; sweat tea  that his mother makes, he notes she uses smaller amounts of sugar o Lunch: usually skips because he eats breakfast late o Supper: Mom usually cooks, pork chops, green beans; sometimes baked chicken, beans; corn; salad  o Snacks: sometimes peanut butter pretzels, sometimes Pringles  o Drinks: lots of water, mostly sweet tea . Current exercise: Notes that he used to be a member of gym; knees and hips hurt him too much to walk much  . Current blood glucose readings: is not checking his blood sugars at home, does not know how to use  . Cardiovascular risk reduction: o Current hypertensive regimen: benazepril 40 mg daily QAM, HCTZ  12.5 mg QAM, metoprolol 100 mg BID; BP at goal <130/80 on last office check; is not checking at home o Current hyperlipidemia regimen: atorvastatin mg daily QPM, Vascepa 2 g BID; LDL at goal <70, TG was >250 prior to Vascepa start  o Current antiplatelet regimen: ASA 81 mg . Recurrent kidney stones: sees [facility]; potassium citrate 15 mEq BID, tamsulosin 0.4 mg daily   Pharmacist Clinical Goal(s):  [name] Over the next 90 days, patient will work with PharmD and primary care provider to address optimized medication management  Interventions: . Comprehensive medication review performed, medication list updated in electronic medical record . Inter-disciplinary care team collaboration (see longitudinal plan of care) . Reviewed instruction to increase Ozempic to 0.5 mg weekly. He verbalizes understanding.  . Reviewed that Ozempic copay card reduces cost to $25 for 30 or 90 day. Once on a stable dose, we can switch to  . Encouraged to bring glucometer to the office when he sees Dr. Wynetta Emery next, and a team member can help teach how to use.  . Reviewed that decreased appetite is related to Ozempic, and is normal   Patient Self Care Activities:  . Patient will bring, document, and provide at future appointments . Patient will take medications as prescribed . Patient will report  any questions or concerns to provider   Please see past updates related to this goal by clicking on the "Past Updates" button in the selected goal         Plan: - Will meet with patient face to face at next PCP visit for teaching  Catie Darnelle Maffucci, PharmD, [place] [phone]

## 2019-05-15 ENCOUNTER — Ambulatory Visit: Payer: Self-pay | Admitting: General Practice

## 2019-05-15 ENCOUNTER — Telehealth: Payer: BLUE CROSS/BLUE SHIELD | Admitting: General Practice

## 2019-05-15 DIAGNOSIS — I10 Essential (primary) hypertension: Secondary | ICD-10-CM

## 2019-05-15 DIAGNOSIS — N183 Chronic kidney disease, stage 3 unspecified: Secondary | ICD-10-CM

## 2019-05-15 DIAGNOSIS — E782 Mixed hyperlipidemia: Secondary | ICD-10-CM

## 2019-05-15 NOTE — Chronic Care Management (AMB) (Signed)
Care Management   Initial Visit Note  05/15/2019 Name: Joshua Lee MRN: [id number] DOB: 01-11-1966  Subjective: "I don't know how to use my meter" "I like sweet tea"  Objective: BP Readings from Last 3 Encounters:  04/03/19 129/74  03/06/19 134/77  08/29/18 109/75   Lab Results  Component Value Date   HGBA1C 8.6 (H) 03/06/2019    Assessment: Joshua Lee is a 54 y.o. year old male who sees [name], DO for primary care. The care management team was consulted for assistance with care management and care coordination needs related to Disease Management Educational Needs Care Coordination Medication Management and Education.   Review of patient status, including review of consultants reports, relevant laboratory and other test results, and collaboration with appropriate care team members and the patient's provider was performed as part of comprehensive patient evaluation and provision of care management services.    SDOH (Social Determinants of Health) assessments performed: Yes See Care Plan activities for detailed interventions related to [name])     Outpatient Encounter Medications as of 05/15/2019  Medication Sig   acetaminophen (TYLENOL) 650 MG CR tablet Take 650-1,950 mg by mouth every 8 (eight) hours as needed for pain.   aspirin 81 MG tablet Take 81 mg by mouth daily.   atorvastatin (LIPITOR) 80 MG tablet Take 1 tablet (80 mg total) by mouth at bedtime.   benazepril (LOTENSIN) 40 MG tablet Take 1 tablet (40 mg total) by mouth daily.   calcium carbonate (TUMS EX) 750 MG chewable tablet Chew 2 tablets by mouth daily as needed for heartburn.    cetirizine (ZYRTEC) 10 MG tablet Take 10 mg by mouth daily as needed for allergies.    Coenzyme Q-10 100 MG capsule Take 100 mg by mouth daily.   empagliflozin (JARDIANCE) 25 MG TABS tablet Take 25 mg by mouth daily.   Garlic 9485 MG CAPS Take 1,000 mg by mouth daily.   hydrochlorothiazide (MICROZIDE) 12.5 MG  capsule Take 1 capsule (12.5 mg total) by mouth daily.   metFORMIN (GLUCOPHAGE) 500 MG tablet Take 2 tablets (1,000 mg total) by mouth 2 (two) times daily with a meal.   metoprolol tartrate (LOPRESSOR) 100 MG tablet Take 1 tablet (100 mg total) by mouth 2 (two) times daily.   Miconazole Nitrate (LOTRIMIN AF) 2 % AERO Apply 1 application topically daily as needed.   Potassium Citrate 15 MEQ (1620 MG) TBCR Take 15 mEq by mouth 2 (two) times daily. 2 tab QAM, 1 tab QPM   Semaglutide,0.25 or 0.5MG/DOS, (OZEMPIC, 0.25 OR 0.5 MG/DOSE,) 2 MG/1.5ML SOPN Inject 0.5 mg into the skin once a week. On Tuesdays   tamsulosin (FLOMAX) 0.4 MG CAPS capsule Take 1 capsule (0.4 mg total) by mouth daily.   tolnaftate (TINACTIN) 1 % spray Apply 1 application topically as needed.   VASCEPA 1 g capsule Take 2 g by mouth 2 (two) times daily.   No facility-administered encounter medications on file as of 05/15/2019.    Goals Addressed            This Visit's Progress    RNCM: "I don't take my blood pressure at home" (pt-stated)       CARE PLAN ENTRY (see longtitudinal plan of care for additional care plan information)  Objective:   Last practice recorded BP readings:  BP Readings from Last 3 Encounters:  04/03/19 129/74  03/06/19 134/77  08/29/18 109/75     Most recent eGFR/CrCl: No results found for: EGFR  No components found for: CRCL  Current Barriers:   Knowledge Deficits related to basic understanding of hypertension pathophysiology and self care management  Literacy barriers  Limited Social Lawyer.   Case Manager Clinical Goal(s):   Over the next 120 days, patient will verbalize understanding of plan for hypertension management  Over the next 120 days, patient will attend all scheduled medical appointments: 06-05-2019  Over the next 120 days, patient will demonstrate improved adherence to prescribed treatment plan for hypertension as evidenced by taking  all medications as prescribed, monitoring and recording blood pressure as directed, adhering to low sodium/DASH diet  Over the next 120 days, patient will demonstrate improved health management independence as evidenced by checking blood pressure as directed and notifying PCP if SBP>160 or DBP > 90, taking all medications as prescribe, and adhering to a low sodium diet as discussed.  Over the next 120 days, patient will verbalize basic understanding of hypertension disease process and self health management plan as evidenced by positive lifestyle changes and working with CCM team to achieve health and wellness goals.   Interventions:   Evaluation of current treatment plan related to hypertension self management and patient's adherence to plan as established by provider.  Provided education to patient re: stroke prevention, s/s of heart attack and stroke, DASH diet, complications of uncontrolled blood pressure  Reviewed medications with patient and discussed importance of compliance  Discussed plans with patient for ongoing care management follow up and provided patient with direct contact information for care management team  Reviewed scheduled/upcoming provider appointments including: May 18-2021 at 1030  Patient Self Care Activities:   UNABLE to independently manage HTN and HLD  Follows a low sodium diet/DASH diet  Initial goal documentation       RNCM: Pt-"I don't know how to use my meter" (pt-stated)       CARE PLAN ENTRY (see longtitudinal plan of care for additional care plan information)  Objective:  Lab Results  Component Value Date   HGBA1C 8.6 (H) 03/06/2019    Lab Results  Component Value Date   CREATININE 1.06 04/03/2019   CREATININE 0.95 03/06/2019   CREATININE 1.00 12/05/2018     No results found for: EGFR  Current Barriers:   Knowledge Deficits related to basic Diabetes pathophysiology and self care/management as evidence of elevated hemoglobin A1C of  8.6  Knowledge Deficits related to medications used for management of diabetes  Financial Constraints  Literacy barriers  Knowledge Deficits related to self administration of injectable diabetes medications (ozempic dose was not increased when he was supposed to- is increasing to 0.5 today 05/15/2019)  Does not use cbg meter- knowledge deficit related to how to use glucose meter. The patient has 2 glucose meters but is unsure how to use them. The patient is bringing the meter into the office May 18th- will have hands on teach  Limited Social Support  Case Manager Clinical Goal(s):  Over the next 120 days, patient will demonstrate improved adherence to prescribed treatment plan for diabetes self care/management as evidenced by:   daily monitoring and recording of CBG   adherence to ADA/ carb modified diet  exercise 5 days/week  adherence to prescribed medication regimen  Interventions:   Provided education to patient about basic DM disease process  Reviewed medications with patient and discussed importance of medication adherence.  The patient will increase his Ozempic dose today 4-27  Discussed plans with patient for ongoing care management follow up and provided patient  with direct contact information for care management team  Reviewed scheduled/upcoming provider appointments including: 06-05-2019 at 1030- will have a face to face meeting with the pcp and pharm D and will demonstrate and use teach back method for the patient to check his blood sugars per MD recommendations.   Advised patient, providing education and rationale, to check cbg BID and record, calling pcp for findings outside established parameters.  Will provide the patient with a log book to record blood glucose readings.   Extensive education on ADA/carb modified diet. The patient likes to drink sweet tea. Discussed using sugar substitutes.  The patient verbalized he does like to drink water. Talked about flavored  waters like "clear American", diet lipton teas, diet drinks- the patient does not like diet drinks but is open to trying the diet lipton tea options. Encouraged the patient to explore sugar free beverages to help with controlling his diabetes. The patient states it is hard to eat right all the time. Will provide written educational material when the patient comes to the visit on 06/05/2019.   Patient Self Care Activities:   UNABLE to independently manage diabetes   Self administers injectable DM medication (Ozempic) as prescribed  Checks blood sugars as prescribed and utilize hyper and hypoglycemia protocol as needed  Adheres to prescribed ADA/carb modified  Initial goal documentation         Follow up plan:  Face to Face appointment with care management team member scheduled for: Jun 05, 2019 at 1030 am  Mr. Honeycutt was given information about Care Management services today including:  1. Care Management services include personalized support from designated clinical staff supervised by a physician, including individualized plan of care and coordination with other care providers 2. 24/7 contact phone numbers for assistance for urgent and routine care needs. 3. The patient may stop Care Management services at any time (effective at the end of the month) by phone call to the office staff.  Patient agreed to services and verbal consent obtained.  Noreene Larsson RN, MSN, [facility] Mobile: [phone]

## 2019-05-15 NOTE — Patient Instructions (Signed)
Visit Information  Goals Addressed            This Visit's Progress   . RNCM: "I don't take my blood pressure at home" (pt-stated)       CARE PLAN ENTRY (see longtitudinal plan of care for additional care plan information)  Objective:  . Last practice recorded BP readings:  BP Readings from Last 3 Encounters:  04/03/19 129/74  03/06/19 134/77  08/29/18 109/75 .   [name] Most recent eGFR/CrCl: No results found for: EGFR  No components found for: CRCL  Current Barriers:  [name] Knowledge Deficits related to basic understanding of hypertension pathophysiology and self care management . Literacy barriers . Limited Social Support . Film/video editor.   Case Manager Clinical Goal(s):  [name] Over the next 120 days, patient will verbalize understanding of plan for hypertension management . Over the next 120 days, patient will attend all scheduled medical appointments: 06-05-2019 . Over the next 120 days, patient will demonstrate improved adherence to prescribed treatment plan for hypertension as evidenced by taking all medications as prescribed, monitoring and recording blood pressure as directed, adhering to low sodium/DASH diet . Over the next 120 days, patient will demonstrate improved health management independence as evidenced by checking blood pressure as directed and notifying PCP if SBP>160 or DBP > 90, taking all medications as prescribe, and adhering to a low sodium diet as discussed. . Over the next 120 days, patient will verbalize basic understanding of hypertension disease process and self health management plan as evidenced by positive lifestyle changes and working with CCM team to achieve health and wellness goals.   Interventions:  . Evaluation of current treatment plan related to hypertension self management and patient's adherence to plan as established by provider. . Provided education to patient re: stroke prevention, s/s of heart attack and stroke, DASH diet, complications of  uncontrolled blood pressure . Reviewed medications with patient and discussed importance of compliance . Discussed plans with patient for ongoing care management follow up and provided patient with direct contact information for care management team . Reviewed scheduled/upcoming provider appointments including: May 18-2021 at 1030  Patient Self Care Activities:  . UNABLE to independently manage HTN and HLD . Follows a low sodium diet/DASH diet  Initial goal documentation      . RNCM: Pt-"I don't know how to use my meter" (pt-stated)       CARE PLAN ENTRY (see longtitudinal plan of care for additional care plan information)  Objective:  Lab Results  Component Value Date   HGBA1C 8.6 (H) 03/06/2019 .   Lab Results  Component Value Date   CREATININE 1.06 04/03/2019   CREATININE 0.95 03/06/2019   CREATININE 1.00 12/05/2018 .   [name] No results found for: EGFR  Current Barriers:  [name] Knowledge Deficits related to basic Diabetes pathophysiology and self care/management as evidence of elevated hemoglobin A1C of 8.6 . Knowledge Deficits related to medications used for management of diabetes . Film/video editor . Literacy barriers . Knowledge Deficits related to self administration of injectable diabetes medications (ozempic dose was not increased when he was supposed to- is increasing to 0.5 today 05/15/2019) . Does not use cbg meter- knowledge deficit related to how to use glucose meter. The patient has 2 glucose meters but is unsure how to use them. The patient is bringing the meter into the office May 18th- will have hands on teach . Limited Social Support  Case Manager Clinical Goal(s):  Over the next 120 days, patient will  demonstrate improved adherence to prescribed treatment plan for diabetes self care/management as evidenced by:  . daily monitoring and recording of CBG  . adherence to ADA/ carb modified diet . exercise 5 days/week . adherence to prescribed medication  regimen  Interventions:  . Provided education to patient about basic DM disease process . Reviewed medications with patient and discussed importance of medication adherence.  The patient will increase his Ozempic dose today 4-27 . Discussed plans with patient for ongoing care management follow up and provided patient with direct contact information for care management team . Reviewed scheduled/upcoming provider appointments including: 06-05-2019 at 1030- will have a face to face meeting with the pcp and pharm D and will demonstrate and use teach back method for the patient to check his blood sugars per MD recommendations.  . Advised patient, providing education and rationale, to check cbg BID and record, calling pcp for findings outside established parameters.  Will provide the patient with a log book to record blood glucose readings.  . Extensive education on ADA/carb modified diet. The patient likes to drink sweet tea. Discussed using sugar substitutes.  The patient verbalized he does like to drink water. Talked about flavored waters like "clear American", diet lipton teas, diet drinks- the patient does not like diet drinks but is open to trying the diet lipton tea options. Encouraged the patient to explore sugar free beverages to help with controlling his diabetes. The patient states it is hard to eat right all the time. Will provide written educational material when the patient comes to the visit on 06/05/2019.   Patient Self Care Activities:  . UNABLE to independently manage diabetes  . Self administers injectable DM medication (Ozempic) as prescribed . Checks blood sugars as prescribed and utilize hyper and hypoglycemia protocol as needed . Adheres to prescribed ADA/carb modified  Initial goal documentation        Joshua Lee was given information about Care Management services today including:  1. Care Management services include personalized support from designated clinical staff  supervised by his physician, including individualized plan of care and coordination with other care providers 2. 24/7 contact phone numbers for assistance for urgent and routine care needs. 3. The patient may stop CCM services at any time (effective at the end of the month) by phone call to the office staff.  Patient agreed to services and verbal consent obtained.   Patient verbalizes understanding of instructions provided today.   Face to Face appointment with care management team member scheduled for: Jun 05, 2019 at 10:30  [place], MSN, [facility] Mobile: [phone]

## 2019-06-05 ENCOUNTER — Ambulatory Visit: Payer: Self-pay | Admitting: General Practice

## 2019-06-05 ENCOUNTER — Telehealth: Payer: Self-pay

## 2019-06-05 ENCOUNTER — Ambulatory Visit (INDEPENDENT_AMBULATORY_CARE_PROVIDER_SITE_OTHER): Payer: BLUE CROSS/BLUE SHIELD | Admitting: Family Medicine

## 2019-06-05 ENCOUNTER — Telehealth: Payer: Self-pay | Admitting: General Practice

## 2019-06-05 ENCOUNTER — Other Ambulatory Visit: Payer: Self-pay

## 2019-06-05 ENCOUNTER — Encounter: Payer: Self-pay | Admitting: Family Medicine

## 2019-06-05 ENCOUNTER — Ambulatory Visit: Payer: Self-pay | Admitting: Pharmacist

## 2019-06-05 VITALS — BP 123/82 | HR 85 | Temp 97.6°F | Ht 68.31 in | Wt 252.0 lb

## 2019-06-05 DIAGNOSIS — E782 Mixed hyperlipidemia: Secondary | ICD-10-CM

## 2019-06-05 DIAGNOSIS — E1122 Type 2 diabetes mellitus with diabetic chronic kidney disease: Secondary | ICD-10-CM

## 2019-06-05 DIAGNOSIS — E1159 Type 2 diabetes mellitus with other circulatory complications: Secondary | ICD-10-CM

## 2019-06-05 DIAGNOSIS — N183 Chronic kidney disease, stage 3 unspecified: Secondary | ICD-10-CM | POA: Diagnosis not present

## 2019-06-05 DIAGNOSIS — I10 Essential (primary) hypertension: Secondary | ICD-10-CM

## 2019-06-05 LAB — BAYER DCA HB A1C WAIVED: HB A1C (BAYER DCA - WAIVED): 7.6 % — ABNORMAL HIGH (ref <7.0)

## 2019-06-05 MED ORDER — GLUCOSE BLOOD VI STRP: ORAL_STRIP | 12 refills | Status: DC

## 2019-06-05 MED ORDER — OZEMPIC (1 MG/DOSE) 2 MG/1.5ML ~~LOC~~ SOPN: 1.0000 mg | PEN_INJECTOR | SUBCUTANEOUS | 1 refills | Status: DC

## 2019-06-05 NOTE — Telephone Encounter (Cosign Needed)
Patient calling to state he was charged for a 2 month supply of Ozempic injection but only picked up the available 1 month dose. Please contact patient to discuss charges. Patient states he also has a coupon for medication.

## 2019-06-05 NOTE — Patient Instructions (Signed)
Visit Information  Goals Addressed            This Visit's Progress   . RNCM: "I don't take my blood pressure at home" (pt-stated)       CARE PLAN ENTRY (see longtitudinal plan of care for additional care plan information)  Objective:  . Last practice recorded BP readings:  . BP Readings from Last 3 Encounters: .  06/05/19 . 123/82 .  04/03/19 . 129/74 .  03/06/19 . 134/77 .    [name] Most recent eGFR/CrCl: No results found for: EGFR  No components found for: CRCL  Current Barriers:  [name] Knowledge Deficits related to basic understanding of hypertension pathophysiology and self care management . Literacy barriers . Limited Social Support . Film/video editor.   Case Manager Clinical Goal(s):  [name] Over the next 120 days, patient will verbalize understanding of plan for hypertension management . Over the next 120 days, patient will attend all scheduled medical appointments: 06-05-2019 . Over the next 120 days, patient will demonstrate improved adherence to prescribed treatment plan for hypertension as evidenced by taking all medications as prescribed, monitoring and recording blood pressure as directed, adhering to low sodium/DASH diet . Over the next 120 days, patient will demonstrate improved health management independence as evidenced by checking blood pressure as directed and notifying PCP if SBP>160 or DBP > 90, taking all medications as prescribe, and adhering to a low sodium diet as discussed. . Over the next 120 days, patient will verbalize basic understanding of hypertension disease process and self health management plan as evidenced by positive lifestyle changes and working with CCM team to achieve health and wellness goals.   Interventions:  . Evaluation of current treatment plan related to hypertension self management and patient's adherence to plan as established by provider. . Provided education to patient re: stroke prevention, s/s of heart attack and stroke, DASH diet,  complications of uncontrolled blood pressure . Reviewed medications with patient and discussed importance of compliance.  The patient did a complete review with the pharm D in co visit today when in the office . Discussed plans with patient for ongoing care management follow up and provided patient with direct contact information for care management team . Reviewed scheduled/upcoming provider appointments including: May 18-2021 at 1030.  Saw patient in the office on co-visit with the pharm D. Will monitor for changes.   Patient Self Care Activities:  . UNABLE to independently manage HTN and HLD . Follows a low sodium diet/DASH diet  Please see past updates related to this goal by clicking on the "Past Updates" button in the selected goal       . RNCM: Pt-"I don't know how to use my meter" (pt-stated)       CARE PLAN ENTRY (see longtitudinal plan of care for additional care plan information)  Objective:  Lab Results  Component Value Date   HGBA1C 8.6 (H) 03/06/2019 .   Lab Results  Component Value Date   CREATININE 1.06 04/03/2019   CREATININE 0.95 03/06/2019   CREATININE 1.00 12/05/2018 .   [name] No results found for: EGFR  Current Barriers:  [name] Knowledge Deficits related to basic Diabetes pathophysiology and self care/management as evidence of elevated hemoglobin A1C of 8.6- decrease of A1C to 7.6 in the office today: 06-05-2019 . Knowledge Deficits related to medications used for management of diabetes . Film/video editor . Literacy barriers . Knowledge Deficits related to self administration of injectable diabetes medications (ozempic dose was not increased when  he was supposed to- is increasing to 0.5 today 05/15/2019) . Does not use cbg meter- knowledge deficit related to how to use glucose meter. The patient has 2 glucose meters but is unsure how to use them. The patient is bringing the meter into the office May 18th- will have hands on teaching. This has been completed on  today's visit. . Limited Social Support  Case Manager Clinical Goal(s):  Over the next 120 days, patient will demonstrate improved adherence to prescribed treatment plan for diabetes self care/management as evidenced by:  . daily monitoring and recording of CBG.  .  . adherence to ADA/ carb modified diet . exercise 5 days/week . adherence to prescribed medication regimen  Interventions:  . Provided education to patient about basic DM disease process . Reviewed medications with patient and discussed importance of medication adherence.  The patient has increased his Ozempic dose to 0.75m and will see Dr. JWynetta Emerytoday for possibly new dose.  . Education on what hyperglycemia and hypoglycemia signs and symptoms are.  . Praised the patient for getting hemoglobin A1C down to 7.6 . Discussed plans with patient for ongoing care management follow up and provided patient with direct contact information for care management team . Reviewed scheduled/upcoming provider appointments including: 06-05-2019 at 1030- will have a face to face meeting with the pcp and pharm D and will demonstrate and use teach back method for the patient to check his blood sugars per MD recommendations.  . Advised patient, providing education and rationale, to check cbg BID and record, calling pcp for findings outside established parameters.  Will provide the patient with a log book to record blood glucose readings. The patient brought meters in and was instructed how to use. The patient needs new strips because the strips he had for the meter have expired. Hands on teaching provided by pharm D. Education on fasting blood glucose numbers and post prandial (after meals) education done. Log book supplied for the patient to write down his blood sugar readings . Extensive education on ADA/carb modified diet. The patient likes to drink sweet tea. Discussed using sugar substitutes.  The patient verbalized he does like to drink water. Talked  about flavored waters like "clear American", diet lipton teas, diet drinks- the patient does not like diet drinks but is open to trying the diet lipton tea options. Encouraged the patient to explore sugar free beverages to help with controlling his diabetes. The patient states it is hard to eat right all the time. Will provide written educational material when the patient comes to the visit on 06/05/2019.   Patient Self Care Activities:  . UNABLE to independently manage diabetes  . Self administers injectable DM medication (Ozempic) as prescribed . Checks blood sugars as prescribed and utilize hyper and hypoglycemia protocol as needed . Adheres to prescribed ADA/carb modified  Please see past updates related to this goal by clicking on the "Past Updates" button in the selected goal         Patient verbalizes understanding of instructions provided today.   The care management team will reach out to the patient again over the next 60 to 90 days.   PNoreene LarssonRN, MSN, [facility] Mobile: [phone]

## 2019-06-05 NOTE — Telephone Encounter (Signed)
Spoke with pharmacy staff and canceled 0.5MG  Ozempic refills per Olevia Perches, DO.

## 2019-06-05 NOTE — Chronic Care Management (AMB) (Signed)
Care Management   Follow Up Note   06/05/2019 Name: Joshua Lee MRN: [id number] DOB: [birth year]  Referred by: [name], DO Reason for referral : Care Coordination (Face to Face visit with pharmacy: Meter use/DM- othe rchronic conditions)   Joshua Lee is a 54 y.o. year old male who is a primary care patient of [name], DO. The care management team was consulted for assistance with care management and care coordination needs.    Review of patient status, including review of consultants reports, relevant laboratory and other test results, and collaboration with appropriate care team members and the patient's provider was performed as part of comprehensive patient evaluation and provision of chronic care management services.    SDOH (Social Determinants of Health) assessments performed: Yes- talked about activity level and AD/LW information provided See Care Plan activities for detailed interventions related to [facility])     Advanced Directives: See Care Plan and Vynca application for related entries.   Goals Addressed            This Visit's Progress   . RNCM: "I don't take my blood pressure at home" (pt-stated)       CARE PLAN ENTRY (see longtitudinal plan of care for additional care plan information)  Objective:  . Last practice recorded BP readings:  . BP Readings from Last 3 Encounters: .  06/05/19 . 123/82 .  04/03/19 . 129/74 .  03/06/19 . 134/77 .    [name] Most recent eGFR/CrCl: No results found for: EGFR  No components found for: CRCL  Current Barriers:  [name] Knowledge Deficits related to basic understanding of hypertension pathophysiology and self care management . Literacy barriers . Limited Social Support . Film/video editor.   Case Manager Clinical Goal(s):  [name] Over the next 120 days, patient will verbalize understanding of plan for hypertension management . Over the next 120 days, patient will attend all scheduled medical appointments:  06-05-2019 . Over the next 120 days, patient will demonstrate improved adherence to prescribed treatment plan for hypertension as evidenced by taking all medications as prescribed, monitoring and recording blood pressure as directed, adhering to low sodium/DASH diet . Over the next 120 days, patient will demonstrate improved health management independence as evidenced by checking blood pressure as directed and notifying PCP if SBP>160 or DBP > 90, taking all medications as prescribe, and adhering to a low sodium diet as discussed. . Over the next 120 days, patient will verbalize basic understanding of hypertension disease process and self health management plan as evidenced by positive lifestyle changes and working with CCM team to achieve health and wellness goals.   Interventions:  . Evaluation of current treatment plan related to hypertension self management and patient's adherence to plan as established by provider. . Provided education to patient re: stroke prevention, s/s of heart attack and stroke, DASH diet, complications of uncontrolled blood pressure . Reviewed medications with patient and discussed importance of compliance.  The patient did a complete review with the pharm D in co visit today when in the office . Discussed plans with patient for ongoing care management follow up and provided patient with direct contact information for care management team . Reviewed scheduled/upcoming provider appointments including: May 18-2021 at 1030.  Saw patient in the office on co-visit with the pharm D. Will monitor for changes.   Patient Self Care Activities:  . UNABLE to independently manage HTN and HLD . Follows a low sodium diet/DASH diet  Please see past  updates related to this goal by clicking on the "Past Updates" button in the selected goal       . RNCM: Pt-"I don't know how to use my meter" (pt-stated)       CARE PLAN ENTRY (see longtitudinal plan of care for additional care plan  information)  Objective:  Lab Results  Component Value Date   HGBA1C 8.6 (H) 03/06/2019 .   Lab Results  Component Value Date   CREATININE 1.06 04/03/2019   CREATININE 0.95 03/06/2019   CREATININE 1.00 12/05/2018 .   [name] No results found for: EGFR  Current Barriers:  [name] Knowledge Deficits related to basic Diabetes pathophysiology and self care/management as evidence of elevated hemoglobin A1C of 8.6- decrease of A1C to 7.6 in the office today: 06-05-2019 . Knowledge Deficits related to medications used for management of diabetes . Film/video editor . Literacy barriers . Knowledge Deficits related to self administration of injectable diabetes medications (ozempic dose was not increased when he was supposed to- is increasing to 0.5 today 05/15/2019) . Does not use cbg meter- knowledge deficit related to how to use glucose meter. The patient has 2 glucose meters but is unsure how to use them. The patient is bringing the meter into the office May 18th- will have hands on teaching. This has been completed on today's visit. . Limited Social Support  Case Manager Clinical Goal(s):  Over the next 120 days, patient will demonstrate improved adherence to prescribed treatment plan for diabetes self care/management as evidenced by:  . daily monitoring and recording of CBG.  .  . adherence to ADA/ carb modified diet . exercise 5 days/week . adherence to prescribed medication regimen  Interventions:  . Provided education to patient about basic DM disease process . Reviewed medications with patient and discussed importance of medication adherence.  The patient has increased his Ozempic dose to 0.'5mg'$  and will see Dr. Wynetta Emery today for possibly new dose.  . Education on what hyperglycemia and hypoglycemia signs and symptoms are.  . Praised the patient for getting hemoglobin A1C down to 7.6 . Discussed plans with patient for ongoing care management follow up and provided patient with direct contact  information for care management team . Reviewed scheduled/upcoming provider appointments including: 06-05-2019 at 1030- will have a face to face meeting with the pcp and pharm D and will demonstrate and use teach back method for the patient to check his blood sugars per MD recommendations.  . Advised patient, providing education and rationale, to check cbg BID and record, calling pcp for findings outside established parameters.  Will provide the patient with a log book to record blood glucose readings. The patient brought meters in and was instructed how to use. The patient needs new strips because the strips he had for the meter have expired. Hands on teaching provided by pharm D. Education on fasting blood glucose numbers and post prandial (after meals) education done. Log book supplied for the patient to write down his blood sugar readings . Extensive education on ADA/carb modified diet. The patient likes to drink sweet tea. Discussed using sugar substitutes.  The patient verbalized he does like to drink water. Talked about flavored waters like "clear American", diet lipton teas, diet drinks- the patient does not like diet drinks but is open to trying the diet lipton tea options. Encouraged the patient to explore sugar free beverages to help with controlling his diabetes. The patient states it is hard to eat right all the time. Will provide written  educational material when the patient comes to the visit on 06/05/2019.   Patient Self Care Activities:  . UNABLE to independently manage diabetes  . Self administers injectable DM medication (Ozempic) as prescribed . Checks blood sugars as prescribed and utilize hyper and hypoglycemia protocol as needed . Adheres to prescribed ADA/carb modified  Please see past updates related to this goal by clicking on the "Past Updates" button in the selected goal          The care management team will reach out to the patient again over the next 60 to 90 days.    Noreene Larsson RN, MSN, [facility] Mobile: [phone]

## 2019-06-05 NOTE — Telephone Encounter (Signed)
Pt would like Catie to call him back regarding dosage and copay savings cards.

## 2019-06-05 NOTE — Chronic Care Management (AMB) (Signed)
  Chronic Care Management   Note  06/05/2019 Name: Joshua Lee MRN: [id number] DOB: [birth year]  Joshua Lee is a 54 y.o. year old male who is a primary care patient of [name], DO. The CCM team was consulted for assistance with chronic disease management and care coordination needs.    Received call from patient. He notes he received 2 pens of Ozempic 1 mg, and was charged $49.99 for 56 day supply, though coupon card notes that cost will be $25 for 1 or 3 months.   [employer], spoke with the pharmacist. They have received the new Ozempic pens which have 4 mg total, so 4 doses per pen. They re-ran the script for 84 days, which had the same $49.99 copay. The patient is going to go back and pick up a third pen (completing the 84 day supply) and the pharmacist is going to call the help desk phone number on the coupon card to determine if the patient should be getting the $25 for the 84 days.   Catie Feliz Beam, PharmD, [facility] Clinical Pharmacist [facility] [phone]

## 2019-06-05 NOTE — Progress Notes (Signed)
BP 123/82 (BP Location: Right Arm, Patient Position: Sitting, Cuff Size: Large)   Pulse 85   Temp 97.6 F (36.4 C) (Oral)   Ht 5' 8.31" (1.735 m)   Wt 252 lb (114.3 kg)   SpO2 97%   BMI 37.97 kg/m    Subjective:    Patient ID: Joshua Lee, male    DOB: [birth year], 54 y.o.   MRN: [id number]  HPI: Joshua Lee is a 54 y.o. male  Chief Complaint  Patient presents with  . Diabetes   DIABETES Hypoglycemic episodes:no Polydipsia/polyuria: no Visual disturbance: no Chest pain: no Paresthesias: no Glucose Monitoring: no  Accucheck frequency: Not Checking Taking Insulin?: no Blood Pressure Monitoring: not checking Retinal Examination: Up to Date Foot Exam: Up to Date Diabetic Education: in the process Pneumovax: Up to Date Influenza: Up to Date Aspirin: yes   Relevant past medical, surgical, family and social history reviewed and updated as indicated. Interim medical history since our last visit reviewed. Allergies and medications reviewed and updated.  Review of Systems  Constitutional: Negative.   Respiratory: Negative.   Cardiovascular: Negative.   Gastrointestinal: Negative.   Psychiatric/Behavioral: Negative.     Per HPI unless specifically indicated above     Objective:    BP 123/82 (BP Location: Right Arm, Patient Position: Sitting, Cuff Size: Large)   Pulse 85   Temp 97.6 F (36.4 C) (Oral)   Ht 5' 8.31" (1.735 m)   Wt 252 lb (114.3 kg)   SpO2 97%   BMI 37.97 kg/m   Wt Readings from Last 3 Encounters:  06/05/19 252 lb (114.3 kg)  03/06/19 255 lb (115.7 kg)  08/29/18 253 lb (114.8 kg)    Physical Exam Vitals and nursing note reviewed.  Constitutional:      General: He is not in acute distress.    Appearance: Normal appearance. He is obese. He is not ill-appearing, toxic-appearing or diaphoretic.  HENT:     Head: Normocephalic and atraumatic.     Right Ear: External ear normal.     Left Ear: External ear normal.     Nose: Nose  normal.     Mouth/Throat:     Mouth: Mucous membranes are moist.     Pharynx: Oropharynx is clear.  Eyes:     General: No scleral icterus.       Right eye: No discharge.        Left eye: No discharge.     Extraocular Movements: Extraocular movements intact.     Conjunctiva/sclera: Conjunctivae normal.     Pupils: Pupils are equal, round, and reactive to light.  Cardiovascular:     Rate and Rhythm: Normal rate and regular rhythm.     Pulses: Normal pulses.     Heart sounds: Normal heart sounds. No murmur. No friction rub. No gallop.   Pulmonary:     Effort: Pulmonary effort is normal. No respiratory distress.     Breath sounds: Normal breath sounds. No stridor. No wheezing, rhonchi or rales.  Chest:     Chest wall: No tenderness.  Musculoskeletal:        General: Normal range of motion.     Cervical back: Normal range of motion and neck supple.  Skin:    General: Skin is warm and dry.     Capillary Refill: Capillary refill takes less than 2 seconds.     Coloration: Skin is not jaundiced or pale.     Findings: No bruising, erythema, lesion or rash.  Neurological:     General: No focal deficit present.     Mental Status: He is alert and oriented to person, place, and time. Mental status is at baseline.  Psychiatric:        Mood and Affect: Mood normal.        Behavior: Behavior normal.        Thought Content: Thought content normal.        Judgment: Judgment normal.     Results for orders placed or performed in visit on 04/05/19  Cologuard  Result Value Ref Range   Cologuard Negative Negative  HM COLONOSCOPY  Result Value Ref Range   HM Colonoscopy See Report (in chart) See Report (in chart), Patient Reported      Assessment & Plan:   Problem List Items Addressed This Visit      Endocrine   Type 2 diabetes mellitus with stage 3 chronic kidney disease, without long-term current use of insulin ([place]) - Primary    Doing much better with A1c down to 7.6 from 8.6. Will  increase his ozempic to 1mg  weekly (when he finishes the pen he has at home) and recheck 3 months. Continue to work with CCM. OK to just check AM sugars as not on insulin.       Relevant Medications   Semaglutide, 1 MG/DOSE, (OZEMPIC, 1 MG/DOSE,) 2 MG/1.5ML SOPN   Other Relevant Orders   Bayer DCA Hb A1c Waived       Follow up plan: Return in about 3 months (around 09/05/2019).

## 2019-06-05 NOTE — Patient Instructions (Signed)
Visit Information  Goals Addressed            This Visit's Progress     Patient Stated   . PharmD "I want to understand my medications" (pt-stated)       [place] (see longtitudinal plan of care for additional care plan information)  Current Barriers:  . Diabetes: uncontrolled; complicated by chronic medical conditions including HTN, HLD, most recent A1c 8.6% . Most recent eGFR: 80 mL/min . Current antihyperglycemic regimen: metformin 1000 mg BID, Jardiance 25 mg daily QAM, Ozempic 0.5 mg weekly (took 4th dose today) . Reports consistent nocturia  . Current blood glucose readings: not checking sugars at home, did not remember how to use glucometer. Brought Contour Next and One Touch old meters to clinic today.  . Cardiovascular risk reduction: o Current hypertensive regimen: benazepril 40 mg daily QAM, HCTZ 12.5 mg QAM, metoprolol 100 mg BID; BP at goal <130/80 today in office.  o Current hyperlipidemia regimen: atorvastatin mg daily QPM, Vascepa 2 g BID; LDL at goal <70, TG was >250 prior to Vascepa start  o Current antiplatelet regimen: ASA 81 mg . Recurrent kidney stones: sees [facility]; potassium citrate 15 mEq BID, tamsulosin 0.4 mg daily   Pharmacist Clinical Goal(s):  [name] Over the next 90 days, patient will work with PharmD and primary care provider to address optimized medication management  Interventions: . Comprehensive medication review performed, medication list updated in electronic medical record . Inter-disciplinary care team collaboration (see longitudinal plan of care) . Reviewed [employer] glucometer- Contour Next covered. Demonstrated glucometer technique. Updated time. Patient only has expired strips, collaborated w/ PCP to send updated prescription for Contour Next strips.  [name] PCP to increase Ozempic to 1 mg weekly. Will collaborate w/ RN CM to provide education and reinforcement.  . Reviewed goal A1c, goal fasting, and goal 2 hour post prandial glucose.    Patient Self Care Activities:  . Patient will bring, document, and provide at future appointments . Patient will take medications as prescribed . Patient will report any questions or concerns to provider   Please see past updates related to this goal by clicking on the "Past Updates" button in the selected goal         Patient verbalizes understanding of instructions provided today.    Plan:  - Scheduled f/u call in ~ 8 weeks  Catie Darnelle Maffucci, PharmD, [place] [phone]

## 2019-06-05 NOTE — Addendum Note (Signed)
Addended by: Lourena Simmonds on: 06/05/2019 03:35 PM   Modules accepted: Orders

## 2019-06-05 NOTE — Assessment & Plan Note (Addendum)
Doing much better with A1c down to 7.6 from 8.6. Will increase his ozempic to 1mg  weekly (when he finishes the pen he has at home) and recheck 3 months. Continue to work with CCM. OK to just check AM sugars as not on insulin.

## 2019-06-05 NOTE — Chronic Care Management (AMB) (Signed)
Chronic Care Management   Follow Up Note   06/05/2019 Name: Joshua Lee MRN: [id number] DOB: 07-18-1965  Referred by: [name], DO Reason for referral : Chronic Care Management (Medication Management)   Joshua Lee is a 54 y.o. year old male who is a primary care patient of [name], DO. The CCM team was consulted for assistance with chronic disease management and care coordination needs.    Met with patient face to face w/ RN CM prior to PCP appointment.   Review of patient status, including review of consultants reports, relevant laboratory and other test results, and collaboration with appropriate care team members and the patient's provider was performed as part of comprehensive patient evaluation and provision of chronic care management services.    SDOH (Social Determinants of Health) assessments performed: No See Care Plan activities for detailed interventions related to [facility])     Outpatient Encounter Medications as of 06/05/2019  Medication Sig  . acetaminophen (TYLENOL) 650 MG CR tablet Take 650-1,950 mg by mouth every 8 (eight) hours as needed for pain.  [name] aspirin 81 MG tablet Take 81 mg by mouth daily.  [name] atorvastatin (LIPITOR) 80 MG tablet Take 1 tablet (80 mg total) by mouth at bedtime.  . benazepril (LOTENSIN) 40 MG tablet Take 1 tablet (40 mg total) by mouth daily.  . calcium carbonate (TUMS EX) 750 MG chewable tablet Chew 2 tablets by mouth daily as needed for heartburn.   . cetirizine (ZYRTEC) 10 MG tablet Take 10 mg by mouth daily as needed for allergies.   . Coenzyme Q-10 100 MG capsule Take 100 mg by mouth daily.  . empagliflozin (JARDIANCE) 25 MG TABS tablet Take 25 mg by mouth daily.  . Garlic 0109 MG CAPS Take 1,000 mg by mouth daily.  . hydrochlorothiazide (MICROZIDE) 12.5 MG capsule Take 1 capsule (12.5 mg total) by mouth daily.  . metFORMIN (GLUCOPHAGE) 500 MG tablet Take 2 tablets (1,000 mg total) by mouth 2 (two) times daily with a  meal.  . metoprolol tartrate (LOPRESSOR) 100 MG tablet Take 1 tablet (100 mg total) by mouth 2 (two) times daily.  . Potassium Citrate 15 MEQ (1620 MG) TBCR Take 15 mEq by mouth 2 (two) times daily. 2 tab QAM, 1 tab QPM  . Semaglutide,0.25 or 0.5MG/DOS, (OZEMPIC, 0.25 OR 0.5 MG/DOSE,) 2 MG/1.5ML SOPN Inject 0.5 mg into the skin once a week. On Tuesdays  . tamsulosin (FLOMAX) 0.4 MG CAPS capsule Take 1 capsule (0.4 mg total) by mouth daily.  [name] VASCEPA 1 g capsule Take 2 g by mouth 2 (two) times daily.  . Miconazole Nitrate (LOTRIMIN AF) 2 % AERO Apply 1 application topically daily as needed.  . tolnaftate (TINACTIN) 1 % spray Apply 1 application topically as needed.   No facility-administered encounter medications on file as of 06/05/2019.     Objective:   Goals Addressed            This Visit's Progress     Patient Stated   . PharmD "I want to understand my medications" (pt-stated)       [place] (see longtitudinal plan of care for additional care plan information)  Current Barriers:  . Diabetes: uncontrolled; complicated by chronic medical conditions including HTN, HLD, most recent A1c 8.6% . Most recent eGFR: 80 mL/min . Current antihyperglycemic regimen: metformin 1000 mg BID, Jardiance 25 mg daily QAM, Ozempic 0.5 mg weekly (took 4th dose today) . Reports consistent nocturia  .  Current blood glucose readings: not checking sugars at home, did not remember how to use glucometer. Brought Contour Next and One Touch old meters to clinic today.  . Cardiovascular risk reduction: o Current hypertensive regimen: benazepril 40 mg daily QAM, HCTZ 12.5 mg QAM, metoprolol 100 mg BID; BP at goal <130/80 today in office.  o Current hyperlipidemia regimen: atorvastatin mg daily QPM, Vascepa 2 g BID; LDL at goal <70, TG was >250 prior to Vascepa start  o Current antiplatelet regimen: ASA 81 mg . Recurrent kidney stones: sees [facility]; potassium citrate 15 mEq BID, tamsulosin 0.4 mg  daily   Pharmacist Clinical Goal(s):  [name] Over the next 90 days, patient will work with PharmD and primary care provider to address optimized medication management  Interventions: . Comprehensive medication review performed, medication list updated in electronic medical record . Inter-disciplinary care team collaboration (see longitudinal plan of care) . Reviewed [employer] glucometer- Contour Next covered. Demonstrated glucometer technique. Updated time. Patient only has expired strips, collaborated w/ PCP to send updated prescription for Contour Next strips.  [name] PCP to increase Ozempic to 1 mg weekly. Will collaborate w/ RN CM to provide education and reinforcement.  . Reviewed goal A1c, goal fasting, and goal 2 hour post prandial glucose.   Patient Self Care Activities:  . Patient will bring, document, and provide at future appointments . Patient will take medications as prescribed . Patient will report any questions or concerns to provider   Please see past updates related to this goal by clicking on the "Past Updates" button in the selected goal          Plan:  - Scheduled f/u call in ~ 8 weeks  Catie Darnelle Maffucci, PharmD, [place] [phone]

## 2019-06-05 NOTE — Chronic Care Management (AMB) (Signed)
  Chronic Care Management   Note  06/05/2019 Name: Joshua Lee MRN: [id number] DOB: 1965/04/20  Joshua Lee is a 54 y.o. year old male who is a primary care patient of [name], DO. The CCM team was consulted for assistance with chronic disease management and care coordination needs.    Assisted patient in downloading and activating coupon card. Called information to the pharmacy. Patient is now receiving 3 pens (4 mg per 3 mL pen, so total of 12 doses of 1 mg, or 84 day supply) and coupon has run through for $25.  Joshua Lee, PharmD, [facility] Clinical Pharmacist [facility] [phone]

## 2019-07-11 LAB — HM DIABETES EYE EXAM

## 2019-08-08 ENCOUNTER — Telehealth: Payer: Self-pay

## 2019-08-08 ENCOUNTER — Ambulatory Visit: Payer: Self-pay | Admitting: General Practice

## 2019-08-08 NOTE — Chronic Care Management (AMB) (Signed)
  Chronic Care Management   Outreach Note  08/08/2019 Name: Joshua Lee MRN: [id number] DOB: [birth year]  Referred by: [name], DO Reason for referral : Care Coordination (RNCM: Follow up call for DM/HTN/HLD)   An unsuccessful telephone outreach was attempted today. The patient was referred to the case management team for assistance with care management and care coordination.   Follow Up Plan: A HIPPA compliant phone message was left for the patient providing contact information and requesting a return call.   Joshua Denver RN, MSN, CCM Community Care Coordinator Page  [facility] Mobile: [phone]

## 2019-08-30 ENCOUNTER — Other Ambulatory Visit: Payer: Self-pay | Admitting: Family Medicine

## 2019-08-30 NOTE — Telephone Encounter (Signed)
OZEMPIC, 1 MG/DOSE, 4 MG/3ML SOPN [[id number]]  Pharmacy requesting a 3 months supply not 2 months

## 2019-08-30 NOTE — Telephone Encounter (Signed)
Requested medication (s) are due for refill today: No  Requested medication (s) are on the active medication list: Yes  Last refill:  08/30/19  Future visit scheduled: Yes  Notes to clinic:  Pharmacy requests 3 month supply.    Requested Prescriptions  Pending Prescriptions Disp Refills   Semaglutide, 1 MG/DOSE, (OZEMPIC, 1 MG/DOSE,) 4 MG/3ML SOPN 6 mL 0    Sig: INJECT 1MG  INTO THE SKIN ONCE A WEEK      Endocrinology:  Diabetes - GLP-1 Receptor Agonists Passed - 08/30/2019 12:40 PM      Passed - HBA1C is between 0 and 7.9 and within 180 days    Hemoglobin A1C  Date Value Ref Range Status  09/04/2015 6.9  Final  09/04/2015 6.9  Final   HB A1C (BAYER DCA - WAIVED)  Date Value Ref Range Status  06/05/2019 7.6 (H) <7.0 % Final    Comment:                                          Diabetic Adult            <7.0                                       Healthy Adult        4.3 - 5.7                                                           (DCCT/NGSP) American Diabetes Association's Summary of Glycemic Recommendations for Adults with Diabetes: Hemoglobin A1c <7.0%. More stringent glycemic goals (A1c <6.0%) may further reduce complications at the cost of increased risk of hypoglycemia.           Passed - Valid encounter within last 6 months    Recent Outpatient Visits           2 months ago Type 2 diabetes mellitus with stage 3 chronic kidney disease, without long-term current use of insulin, unspecified whether stage 3a or 3b CKD (HCC)   [facility] Johnson, [name], DO   4 months ago Type 2 diabetes mellitus with stage 3 chronic kidney disease, without long-term current use of insulin, unspecified whether stage 3a or 3b CKD (HCC)   [facility] Johnson, [name], DO   5 months ago Routine general medical examination at a health care facility   [facility] [place], [name], DO   1 year ago Essential hypertension   [facility]  Cr[place], [place], MD   1 year ago Diabetes mellitus without complication [facility])   [facility] Crissman, [facility], MD       Future Appointments             In 1 week Johnson, Redge Gainer, DO [facility]

## 2019-08-31 ENCOUNTER — Other Ambulatory Visit: Payer: Self-pay | Admitting: Family Medicine

## 2019-09-04 ENCOUNTER — Telehealth: Payer: Self-pay | Admitting: Pharmacist

## 2019-09-04 NOTE — Telephone Encounter (Signed)
Received voicemail from patient that his pharmacy has only been filling a 30 day supply instead of 90 day supply of Ozempic.   Routing to CCM team

## 2019-09-06 ENCOUNTER — Ambulatory Visit (INDEPENDENT_AMBULATORY_CARE_PROVIDER_SITE_OTHER): Payer: BLUE CROSS/BLUE SHIELD | Admitting: Family Medicine

## 2019-09-06 ENCOUNTER — Other Ambulatory Visit: Payer: Self-pay

## 2019-09-06 ENCOUNTER — Encounter: Payer: Self-pay | Admitting: Family Medicine

## 2019-09-06 VITALS — BP 133/78 | HR 80 | Temp 97.6°F | Wt 247.0 lb

## 2019-09-06 DIAGNOSIS — N182 Chronic kidney disease, stage 2 (mild): Secondary | ICD-10-CM | POA: Diagnosis not present

## 2019-09-06 DIAGNOSIS — E1122 Type 2 diabetes mellitus with diabetic chronic kidney disease: Secondary | ICD-10-CM

## 2019-09-06 DIAGNOSIS — Z125 Encounter for screening for malignant neoplasm of prostate: Secondary | ICD-10-CM

## 2019-09-06 DIAGNOSIS — E782 Mixed hyperlipidemia: Secondary | ICD-10-CM | POA: Diagnosis not present

## 2019-09-06 DIAGNOSIS — I10 Essential (primary) hypertension: Secondary | ICD-10-CM

## 2019-09-06 DIAGNOSIS — N183 Chronic kidney disease, stage 3 unspecified: Secondary | ICD-10-CM

## 2019-09-06 LAB — URINALYSIS, ROUTINE W REFLEX MICROSCOPIC
Bilirubin, UA: NEGATIVE
Ketones, UA: NEGATIVE
Leukocytes,UA: NEGATIVE
Nitrite, UA: NEGATIVE
Protein,UA: NEGATIVE
RBC, UA: NEGATIVE
Specific Gravity, UA: 1.01 (ref 1.005–1.030)
Urobilinogen, Ur: 0.2 mg/dL (ref 0.2–1.0)
pH, UA: 5 (ref 5.0–7.5)

## 2019-09-06 LAB — MICROALBUMIN, URINE WAIVED
Creatinine, Urine Waived: 10 mg/dL (ref 10–300)
Microalb, Ur Waived: 10 mg/L (ref 0–19)

## 2019-09-06 LAB — BAYER DCA HB A1C WAIVED: HB A1C (BAYER DCA - WAIVED): 7.1 % — ABNORMAL HIGH (ref <7.0)

## 2019-09-06 MED ORDER — METOPROLOL TARTRATE 100 MG PO TABS: 100.0000 mg | ORAL_TABLET | Freq: Two times a day (BID) | ORAL | 1 refills | Status: DC

## 2019-09-06 MED ORDER — OZEMPIC (1 MG/DOSE) 4 MG/3ML ~~LOC~~ SOPN
PEN_INJECTOR | SUBCUTANEOUS | 1 refills | Status: DC
Start: 2019-09-06 — End: 2020-01-16

## 2019-09-06 MED ORDER — ATORVASTATIN CALCIUM 80 MG PO TABS: 80.0000 mg | ORAL_TABLET | Freq: Every day | ORAL | 1 refills | Status: DC

## 2019-09-06 MED ORDER — HYDROCHLOROTHIAZIDE 12.5 MG PO CAPS: 12.5000 mg | ORAL_CAPSULE | Freq: Every day | ORAL | 1 refills | Status: DC

## 2019-09-06 MED ORDER — TAMSULOSIN HCL 0.4 MG PO CAPS
0.4000 mg | ORAL_CAPSULE | Freq: Every day | ORAL | 1 refills | Status: DC
Start: 2019-09-06 — End: 2020-02-28

## 2019-09-06 MED ORDER — EMPAGLIFLOZIN 25 MG PO TABS: 25.0000 mg | ORAL_TABLET | Freq: Every day | ORAL | 1 refills | Status: DC

## 2019-09-06 MED ORDER — BENAZEPRIL HCL 40 MG PO TABS: 40.0000 mg | ORAL_TABLET | Freq: Every day | ORAL | 1 refills | Status: DC

## 2019-09-06 MED ORDER — METFORMIN HCL 500 MG PO TABS: 1000.0000 mg | ORAL_TABLET | Freq: Two times a day (BID) | ORAL | 1 refills | Status: DC

## 2019-09-06 NOTE — Assessment & Plan Note (Signed)
Rechecking labs today. Await results. Treat as needed.  °

## 2019-09-06 NOTE — Assessment & Plan Note (Signed)
Under good control on current regimen. Continue current regimen. Continue to monitor. Call with any concerns. Refills given. Labs drawn today.   

## 2019-09-06 NOTE — Patient Instructions (Signed)
A1c 7.1%

## 2019-09-06 NOTE — Progress Notes (Signed)
BP 133/78 (BP Location: Left Arm, Patient Position: Sitting, Cuff Size: Normal)    Pulse 80    Temp 97.6 F (36.4 C) (Oral)    Wt 247 lb (112 kg)    SpO2 96%    BMI 37.22 kg/m    Subjective:    Patient ID: Joshua Lee, male    DOB: Oct 19, 1965, 54 y.o.   MRN: [id number]  HPI: Joshua Lee is a 54 y.o. male  Chief Complaint  Patient presents with   Diabetes   Hypertension   Hyperlipidemia   covid booster    questions   DIABETES Hypoglycemic episodes:no Polydipsia/polyuria: no Visual disturbance: no Chest pain: no Paresthesias: no Glucose Monitoring: yes  Accucheck frequency: Daily  Fasting glucose: Taking Insulin?: no Blood Pressure Monitoring: not checking Retinal Examination: Up to Date Foot Exam: Up to Date Diabetic Education: Completed Pneumovax: Up to Date Influenza: Up to Date Aspirin: yes  HYPERTENSION / HYPERLIPIDEMIA Satisfied with current treatment? yes Duration of hypertension: chronic BP monitoring frequency: not checking BP medication side effects: no Past BP meds: benazepril, HCTZ, metoprolol Duration of hyperlipidemia: chronic Cholesterol medication side effects: no Cholesterol supplements: none Past cholesterol medications: atorvastatin Medication compliance: excellent compliance Aspirin: yes Recent stressors: no Recurrent headaches: no Visual changes: no Palpitations: no Dyspnea: no Chest pain: no Lower extremity edema: no Dizzy/lightheaded: no  Relevant past medical, surgical, family and social history reviewed and updated as indicated. Interim medical history since our last visit reviewed. Allergies and medications reviewed and updated.  Review of Systems  Constitutional: Negative.   Respiratory: Negative.   Cardiovascular: Negative.   Gastrointestinal: Negative.   Musculoskeletal: Negative.   Neurological: Negative.   Psychiatric/Behavioral: Negative.     Per HPI unless specifically indicated above       Objective:    BP 133/78 (BP Location: Left Arm, Patient Position: Sitting, Cuff Size: Normal)    Pulse 80    Temp 97.6 F (36.4 C) (Oral)    Wt 247 lb (112 kg)    SpO2 96%    BMI 37.22 kg/m   Wt Readings from Last 3 Encounters:  09/06/19 247 lb (112 kg)  06/05/19 252 lb (114.3 kg)  03/06/19 255 lb (115.7 kg)    Physical Exam Vitals and nursing note reviewed.  Constitutional:      General: He is not in acute distress.    Appearance: Normal appearance. He is not ill-appearing, toxic-appearing or diaphoretic.  HENT:     Head: Normocephalic and atraumatic.     Right Ear: External ear normal.     Left Ear: External ear normal.     Nose: Nose normal.     Mouth/Throat:     Mouth: Mucous membranes are moist.     Pharynx: Oropharynx is clear.  Eyes:     General: No scleral icterus.       Right eye: No discharge.        Left eye: No discharge.     Extraocular Movements: Extraocular movements intact.     Conjunctiva/sclera: Conjunctivae normal.     Pupils: Pupils are equal, round, and reactive to light.  Cardiovascular:     Rate and Rhythm: Normal rate and regular rhythm.     Pulses: Normal pulses.     Heart sounds: Normal heart sounds. No murmur heard.  No friction rub. No gallop.   Pulmonary:     Effort: Pulmonary effort is normal. No respiratory distress.     Breath sounds: Normal breath sounds.  No stridor. No wheezing, rhonchi or rales.  Chest:     Chest wall: No tenderness.  Musculoskeletal:        General: Normal range of motion.     Cervical back: Normal range of motion and neck supple.  Skin:    General: Skin is warm and dry.     Capillary Refill: Capillary refill takes less than 2 seconds.     Coloration: Skin is not jaundiced or pale.     Findings: No bruising, erythema, lesion or rash.  Neurological:     General: No focal deficit present.     Mental Status: He is alert and oriented to person, place, and time. Mental status is at baseline.  Psychiatric:         Mood and Affect: Mood normal.        Behavior: Behavior normal.        Thought Content: Thought content normal.        Judgment: Judgment normal.     Results for orders placed or performed in visit on 07/12/19  HM DIABETES EYE EXAM  Result Value Ref Range   HM Diabetic Eye Exam No Retinopathy No Retinopathy      Assessment & Plan:   Problem List Items Addressed This Visit      Cardiovascular and Mediastinum   Hypertension - Primary    Under good control on current regimen. Continue current regimen. Continue to monitor. Call with any concerns. Refills given. Labs drawn today.       Relevant Medications   atorvastatin (LIPITOR) 80 MG tablet   benazepril (LOTENSIN) 40 MG tablet   empagliflozin (JARDIANCE) 25 MG TABS tablet   hydrochlorothiazide (MICROZIDE) 12.5 MG capsule   metoprolol tartrate (LOPRESSOR) 100 MG tablet   Other Relevant Orders   CBC with Differential/Platelet   Comprehensive metabolic panel   Microalbumin, Urine Waived   TSH     Endocrine   Type 2 diabetes mellitus with stage 3 chronic kidney disease, without long-term current use of insulin (HCC)    Under good control with A1c of 7.1. Continue current regimen. Continue to monitor. Call with any concerns. Refills given. Labs drawn today.       Relevant Medications   atorvastatin (LIPITOR) 80 MG tablet   benazepril (LOTENSIN) 40 MG tablet   empagliflozin (JARDIANCE) 25 MG TABS tablet   metFORMIN (GLUCOPHAGE) 500 MG tablet   Semaglutide, 1 MG/DOSE, (OZEMPIC, 1 MG/DOSE,) 4 MG/3ML SOPN   Other Relevant Orders   Bayer DCA Hb A1c Waived   CBC with Differential/Platelet   Comprehensive metabolic panel   Microalbumin, Urine Waived   TSH   Urinalysis, Routine w reflex microscopic     Genitourinary   CKD (chronic kidney disease), stage II    Rechecking labs today. Await results. Treat as needed.       Relevant Orders   CBC with Differential/Platelet   Comprehensive metabolic panel   Urinalysis,  Routine w reflex microscopic     Other   Hyperlipidemia    Under good control on current regimen. Continue current regimen. Continue to monitor. Call with any concerns. Refills given. Labs drawn today.       Relevant Medications   atorvastatin (LIPITOR) 80 MG tablet   benazepril (LOTENSIN) 40 MG tablet   hydrochlorothiazide (MICROZIDE) 12.5 MG capsule   metoprolol tartrate (LOPRESSOR) 100 MG tablet   Other Relevant Orders   CBC with Differential/Platelet   Comprehensive metabolic panel   Lipid Panel w/o Chol/HDL Ratio  Other Visit Diagnoses    Screening for prostate cancer       Labs drawn today. Await results.    Relevant Orders   PSA       Follow up plan: Return in about 3 months (around 12/07/2019).

## 2019-09-06 NOTE — Assessment & Plan Note (Addendum)
Under good control with A1c of 7.1. Continue current regimen. Continue to monitor. Call with any concerns. Refills given. Labs drawn today.

## 2019-09-07 ENCOUNTER — Encounter: Payer: Self-pay | Admitting: Family Medicine

## 2019-09-07 LAB — COMPREHENSIVE METABOLIC PANEL
ALT: 17 IU/L (ref 0–44)
AST: 16 IU/L (ref 0–40)
Albumin/Globulin Ratio: 1.8 (ref 1.2–2.2)
Albumin: 4.2 g/dL (ref 3.8–4.9)
Alkaline Phosphatase: 47 IU/L — ABNORMAL LOW (ref 48–121)
BUN/Creatinine Ratio: 17 (ref 9–20)
BUN: 19 mg/dL (ref 6–24)
Bilirubin Total: 0.7 mg/dL (ref 0.0–1.2)
CO2: 26 mmol/L (ref 20–29)
Calcium: 9.4 mg/dL (ref 8.7–10.2)
Chloride: 97 mmol/L (ref 96–106)
Creatinine, Ser: 1.1 mg/dL (ref 0.76–1.27)
GFR calc Af Amer: 88 mL/min/{1.73_m2} (ref >59)
GFR calc non Af Amer: 76 mL/min/{1.73_m2} (ref >59)
Globulin, Total: 2.4 g/dL (ref 1.5–4.5)
Glucose: 167 mg/dL — ABNORMAL HIGH (ref 65–99)
Potassium: 4.4 mmol/L (ref 3.5–5.2)
Sodium: 138 mmol/L (ref 134–144)
Total Protein: 6.6 g/dL (ref 6.0–8.5)

## 2019-09-07 LAB — CBC WITH DIFFERENTIAL/PLATELET
Basophils Absolute: 0 10*3/uL (ref 0.0–0.2)
Basos: 0 %
EOS (ABSOLUTE): 0.1 10*3/uL (ref 0.0–0.4)
Eos: 1 %
Hematocrit: 43 % (ref 37.5–51.0)
Hemoglobin: 14.2 g/dL (ref 13.0–17.7)
Immature Grans (Abs): 0 10*3/uL (ref 0.0–0.1)
Immature Granulocytes: 0 %
Lymphocytes Absolute: 1.1 10*3/uL (ref 0.7–3.1)
Lymphs: 12 %
MCH: 28.2 pg (ref 26.6–33.0)
MCHC: 33 g/dL (ref 31.5–35.7)
MCV: 85 fL (ref 79–97)
Monocytes Absolute: 0.7 10*3/uL (ref 0.1–0.9)
Monocytes: 7 %
Neutrophils Absolute: 7.1 10*3/uL — ABNORMAL HIGH (ref 1.4–7.0)
Neutrophils: 80 %
Platelets: 239 10*3/uL (ref 150–450)
RBC: 5.04 x10E6/uL (ref 4.14–5.80)
RDW: 14.8 % (ref 11.6–15.4)
WBC: 9 10*3/uL (ref 3.4–10.8)

## 2019-09-07 LAB — LIPID PANEL W/O CHOL/HDL RATIO
Cholesterol, Total: 94 mg/dL — ABNORMAL LOW (ref 100–199)
HDL: 22 mg/dL — ABNORMAL LOW (ref >39)
LDL Chol Calc (NIH): 33 mg/dL (ref 0–99)
Triglycerides: 252 mg/dL — ABNORMAL HIGH (ref 0–149)
VLDL Cholesterol Cal: 39 mg/dL (ref 5–40)

## 2019-09-07 LAB — PSA: Prostate Specific Ag, Serum: 0.4 ng/mL (ref 0.0–4.0)

## 2019-09-07 LAB — TSH: TSH: 1.91 u[IU]/mL (ref 0.450–4.500)

## 2019-09-12 ENCOUNTER — Ambulatory Visit: Payer: Self-pay | Admitting: General Practice

## 2019-09-12 ENCOUNTER — Telehealth: Payer: Self-pay | Admitting: General Practice

## 2019-09-12 DIAGNOSIS — E782 Mixed hyperlipidemia: Secondary | ICD-10-CM

## 2019-09-12 DIAGNOSIS — N183 Chronic kidney disease, stage 3 unspecified: Secondary | ICD-10-CM

## 2019-09-12 DIAGNOSIS — E1122 Type 2 diabetes mellitus with diabetic chronic kidney disease: Secondary | ICD-10-CM

## 2019-09-12 DIAGNOSIS — I10 Essential (primary) hypertension: Secondary | ICD-10-CM

## 2019-09-12 NOTE — Chronic Care Management (AMB) (Signed)
Care Management   Follow Up Note   09/12/2019 Name: Joshua Lee MRN: [id number] DOB: 12/11/1965  Referred by: Johnson, [name], DO Reason for referral : Care Coordination (RNCM Follow up: DM/HTN/HLD and chronic disease management and care coordination needs)   Joshua Lee is a 53 y.o. year old male who is a primary care patient of Johnson, [name], DO. The care management team was consulted for assistance with care management and care coordination needs.    Review of patient status, including review of consultants reports, relevant laboratory and other test results, and collaboration with appropriate care team members and the patient's provider was performed as part of comprehensive patient evaluation and provision of chronic care management services.    SDOH (Social Determinants of Health) assessments performed: Yes See Care Plan activities for detailed interventions related to SDOH)     Advanced Directives: See Care Plan and Vynca application for related entries.   Goals Addressed              This Visit's Progress   .  RNCM: "I don't take my blood pressure at home" (pt-stated)        CARE PLAN ENTRY (see longtitudinal plan of care for additional care plan information)  Objective:  . Last practice recorded BP readings:  . BP Readings from Last 3 Encounters: .  09/06/19 . 133/78 .  06/05/19 . 123/82 .  04/03/19 . 129/74 .     . Most recent eGFR/CrCl: No results found for: EGFR  No components found for: CRCL  Current Barriers:  . Knowledge Deficits related to basic understanding of hypertension pathophysiology and self care management . Literacy barriers . Limited Social Support . Financial Constraints.   Case Manager Clinical Goal(s):  . Over the next 120 days, patient will verbalize understanding of plan for hypertension management . Over the next 120 days, patient will attend all scheduled medical appointments: 06-05-2019 . Over the next 120 days, patient  will demonstrate improved adherence to prescribed treatment plan for hypertension as evidenced by taking all medications as prescribed, monitoring and recording blood pressure as directed, adhering to low sodium/DASH diet . Over the next 120 days, patient will demonstrate improved health management independence as evidenced by checking blood pressure as directed and notifying PCP if SBP>160 or DBP > 90, taking all medications as prescribe, and adhering to a low sodium diet as discussed. . Over the next 120 days, patient will verbalize basic understanding of hypertension disease process and self health management plan as evidenced by positive lifestyle changes and working with CCM team to achieve health and wellness goals.   Interventions:  . Evaluation of current treatment plan related to hypertension self management and patient's adherence to plan as established by provider. . Provided education to patient re: stroke prevention, s/s of heart attack and stroke, DASH diet, complications of uncontrolled blood pressure . Reviewed medications with patient and discussed importance of compliance.  The patient did a complete review with the pharm D in co visit today when in the office.  09-12-2019 Provided information for the new CCM team pharmacist Julie. The patient has his Ozempic and is taking as prescribed. Will call the CCM team for education and support.  . Discussed plans with patient for ongoing care management follow up and provided patient with direct contact information for care management team . Reviewed scheduled/upcoming provider appointments including: 12-07-2019 with pcp  Patient Self Care Activities:  . UNABLE to independently manage HTN and HLD .   Follows a low sodium diet/DASH diet  Please see past updates related to this goal by clicking on the "Past Updates" button in the selected goal       .  RNCM: Pt-"I don't know how to use my meter" (pt-stated)        CARE PLAN ENTRY (see  longtitudinal plan of care for additional care plan information)  Objective:  . Lab Results .  Component . Value . Date .   . HGBA1C . 7.1 (H) . 09/06/2019 .    . Lab Results .  Component . Value . Date .   . CREATININE . 1.10 . 09/06/2019 .   . BUN . 19 . 09/06/2019 .   . NA . 138 . 09/06/2019 .   . K . 4.4 . 09/06/2019 .   . CL . 97 . 09/06/2019 .   . CO2 . 26 . 09/06/2019 .    . No results found for: EGFR  Current Barriers:  . Knowledge Deficits related to basic Diabetes pathophysiology and self care/management as evidence of elevated hemoglobin A1C of 8.6- decrease of A1C to 7.6 in the office today: 06-05-2019. Further decreased to 7.1 in the office on 09-06-2019 . Knowledge Deficits related to medications used for management of diabetes . Financial Constraints . Literacy barriers . Knowledge Deficits related to self administration of injectable diabetes medications (ozempic dose was not increased when he was supposed to- is increasing to 0.5 today 05/15/2019) . Does not use cbg meter- knowledge deficit related to how to use glucose meter. The patient has 2 glucose meters but is unsure how to use them. The patient is bringing the meter into the office May 18th- will have hands on teaching. This has been completed on today's visit. . Limited Social Support  Case Manager Clinical Goal(s):  Over the next 120 days, patient will demonstrate improved adherence to prescribed treatment plan for diabetes self care/management as evidenced by:  . daily monitoring and recording of CBG.  .  . adherence to ADA/ carb modified diet . exercise 5 days/week . adherence to prescribed medication regimen  Interventions:  . Provided education to patient about basic DM disease process . Reviewed medications with patient and discussed importance of medication adherence.  The patient has increased his Ozempic dose to 0.5mg and will see Dr. Johnson today for possibly new dose. 09-12-2019: The patient had  called about a 90 day supply of Ozempic and left messages. Today the patient verbalized he had his medications and he thinks that Dr. Johnson got the refill fixed to 90 days. Education and support given.  . Education on what hyperglycemia and hypoglycemia signs and symptoms are.  . Praised the patient for getting hemoglobin A1C down to 7.6.  09-12-2019: In the office on 09-06-2019 his hemoglobin A1C was 7.1- praised the patient for positive changes in his health.  . Discussed plans with patient for ongoing care management follow up and provided patient with direct contact information for care management team . Reviewed scheduled/upcoming provider appointments including: 12-07-2019 with the pcp  . Advised patient, providing education and rationale, to check cbg BID and record, calling the pcp for blood sugars out of range. The patient feels he is doing the best he can. He says it is really hard to keep up with everything he is doing. Encouraged the patient to keep moving forward in his health and well being.  . Extensive education on ADA/carb modified diet. The patient likes to drink sweet tea.   Discussed using sugar substitutes.  The patient verbalized he does like to drink water. Talked about flavored waters like "clear American", diet lipton teas, diet drinks- the patient does not like diet drinks but is open to trying the diet lipton tea options. Encouraged the patient to explore sugar free beverages to help with controlling his diabetes. The patient states it is hard to eat right all the time. Will provide written educational material for the patient and send via mail. The patient endorses drinking a lot of water and likes the diet lipton tea.    Patient Self Care Activities:  . UNABLE to independently manage diabetes  . Self administers injectable DM medication (Ozempic) as prescribed . Checks blood sugars as prescribed and utilize hyper and hypoglycemia protocol as needed . Adheres to prescribed  ADA/carb modified  Please see past updates related to this goal by clicking on the "Past Updates" button in the selected goal          Telephone follow up appointment with care management team member scheduled for: 11-14-2019 at [place], MSN, [facility] Mobile: [phone]

## 2019-09-12 NOTE — Patient Instructions (Signed)
Visit Information  Goals Addressed              This Visit's Progress     RNCM: "I don't take my blood pressure at home" (pt-stated)        CARE PLAN ENTRY (see longtitudinal plan of care for additional care plan information)  Objective:   Last practice recorded BP readings:   BP Readings from Last 3 Encounters:   09/06/19  133/78   06/05/19  123/82   04/03/19  129/74       Most recent eGFR/CrCl: No results found for: EGFR  No components found for: CRCL  Current Barriers:   Knowledge Deficits related to basic understanding of hypertension pathophysiology and self care management  Literacy barriers  Limited Social Lawyer.   Case Manager Clinical Goal(s):   Over the next 120 days, patient will verbalize understanding of plan for hypertension management  Over the next 120 days, patient will attend all scheduled medical appointments: 06-05-2019  Over the next 120 days, patient will demonstrate improved adherence to prescribed treatment plan for hypertension as evidenced by taking all medications as prescribed, monitoring and recording blood pressure as directed, adhering to low sodium/DASH diet  Over the next 120 days, patient will demonstrate improved health management independence as evidenced by checking blood pressure as directed and notifying PCP if SBP>160 or DBP > 90, taking all medications as prescribe, and adhering to a low sodium diet as discussed.  Over the next 120 days, patient will verbalize basic understanding of hypertension disease process and self health management plan as evidenced by positive lifestyle changes and working with CCM team to achieve health and wellness goals.   Interventions:   Evaluation of current treatment plan related to hypertension self management and patient's adherence to plan as established by provider.  Provided education to patient re: stroke prevention, s/s of heart attack and stroke, DASH  diet, complications of uncontrolled blood pressure  Reviewed medications with patient and discussed importance of compliance.  The patient did a complete review with the pharm D in co visit today when in the office.  09-12-2019 Provided information for the new CCM team pharmacist Almyra Free. The patient has his Ozempic and is taking as prescribed. Will call the CCM team for education and support.   Discussed plans with patient for ongoing care management follow up and provided patient with direct contact information for care management team  Reviewed scheduled/upcoming provider appointments including: 12-07-2019 with pcp  Patient Self Care Activities:   UNABLE to independently manage HTN and HLD  Follows a low sodium diet/DASH diet  Please see past updates related to this goal by clicking on the "Past Updates" button in the selected goal         RNCM: Pt-"I don't know how to use my meter" (pt-stated)        CARE PLAN ENTRY (see longtitudinal plan of care for additional care plan information)  Objective:   Lab Results   Component  Value  Date     HGBA1C  7.1 (H)  09/06/2019      Lab Results   Component  Value  Date     CREATININE  1.10  09/06/2019     BUN  19  09/06/2019     NA  138  09/06/2019     K  4.4  09/06/2019     CL  97  09/06/2019     CO2  26  09/06/2019  No results found for: EGFR  Current Barriers:   Knowledge Deficits related to basic Diabetes pathophysiology and self care/management as evidence of elevated hemoglobin A1C of 8.6- decrease of A1C to 7.6 in the office today: 06-05-2019. Further decreased to 7.1 in the office on 09-06-2019  Knowledge Deficits related to medications used for management of diabetes  Financial Constraints  Literacy barriers  Knowledge Deficits related to self administration of injectable diabetes medications (ozempic dose was not increased when he was supposed to- is increasing to 0.5 today  05/15/2019)  Does not use cbg meter- knowledge deficit related to how to use glucose meter. The patient has 2 glucose meters but is unsure how to use them. The patient is bringing the meter into the office May 18th- will have hands on teaching. This has been completed on today's visit.  Limited Social Support  Case Manager Clinical Goal(s):  Over the next 120 days, patient will demonstrate improved adherence to prescribed treatment plan for diabetes self care/management as evidenced by:   daily monitoring and recording of CBG.  [name]   adherence to ADA/ carb modified diet  exercise 5 days/week  adherence to prescribed medication regimen  Interventions:   Provided education to patient about basic DM disease process  Reviewed medications with patient and discussed importance of medication adherence.  The patient has increased his Ozempic dose to 0.27m and will see Dr. JWynetta Emerytoday for possibly new dose. 09-12-2019: The patient had called about a 90 day supply of Ozempic and left messages. Today the patient verbalized he had his medications and he thinks that Dr. JWynetta Emerygot the refill fixed to 90 days. Education and support given.   Education on what hyperglycemia and hypoglycemia signs and symptoms are.   Praised the patient for getting hemoglobin A1C down to 7.6.  09-12-2019: In the office on 09-06-2019 his hemoglobin A1C was 7.1- praised the patient for positive changes in his health.   Discussed plans with patient for ongoing care management follow up and provided patient with direct contact information for care management team  Reviewed scheduled/upcoming provider appointments including: 12-07-2019 with the pcp   Advised patient, providing education and rationale, to check cbg BID and record, calling the pcp for blood sugars out of range. The patient feels he is doing the best he can. He says it is really hard to keep up with everything he is doing. Encouraged the patient to keep moving  forward in his health and well being.   Extensive education on ADA/carb modified diet. The patient likes to drink sweet tea. Discussed using sugar substitutes.  The patient verbalized he does like to drink water. Talked about flavored waters like "clear American", diet lipton teas, diet drinks- the patient does not like diet drinks but is open to trying the diet lipton tea options. Encouraged the patient to explore sugar free beverages to help with controlling his diabetes. The patient states it is hard to eat right all the time. Will provide written educational material for the patient and send via mail. The patient endorses drinking a lot of water and likes the diet lipton tea.    Patient Self Care Activities:   UNABLE to independently manage diabetes   Self administers injectable DM medication (Ozempic) as prescribed  Checks blood sugars as prescribed and utilize hyper and hypoglycemia protocol as needed  Adheres to prescribed ADA/carb modified  Please see past updates related to this goal by clicking on the "Past Updates" button in the selected goal  Patient verbalizes understanding of instructions provided today.   Telephone follow up appointment with care management team member scheduled for: 11-14-2019 at 68 am  Noreene Larsson RN, MSN, [facility] Mobile: [phone]

## 2019-11-14 ENCOUNTER — Ambulatory Visit: Payer: Self-pay | Admitting: General Practice

## 2019-11-14 ENCOUNTER — Telehealth: Payer: Self-pay | Admitting: General Practice

## 2019-11-14 DIAGNOSIS — I10 Essential (primary) hypertension: Secondary | ICD-10-CM

## 2019-11-14 DIAGNOSIS — E1122 Type 2 diabetes mellitus with diabetic chronic kidney disease: Secondary | ICD-10-CM

## 2019-11-14 DIAGNOSIS — E782 Mixed hyperlipidemia: Secondary | ICD-10-CM

## 2019-11-14 DIAGNOSIS — N183 Chronic kidney disease, stage 3 unspecified: Secondary | ICD-10-CM

## 2019-11-14 NOTE — Chronic Care Management (AMB) (Signed)
Care Management   Follow Up Note   11/14/2019 Name: Joshua Lee MRN: [id number] DOB: 09-12-1965  Referred by: [name], DO Reason for referral : Care Coordination (RNCM follow up for Chronic Disease Management and Care Coordination Needs)   Joshua Lee is a 54 y.o. year old male who is a primary care patient of [name], DO. The care management team was consulted for assistance with care management and care coordination needs.    Review of patient status, including review of consultants reports, relevant laboratory and other test results, and collaboration with appropriate care team members and the patient's provider was performed as part of comprehensive patient evaluation and provision of chronic care management services.    SDOH (Social Determinants of Health) assessments performed: Yes See Care Plan activities for detailed interventions related to [facility])     Advanced Directives: See Care Plan and Vynca application for related entries.   Goals Addressed              This Visit's Progress   .  RNCM: "I don't take my blood pressure at home" (pt-stated)        CARE PLAN ENTRY (see longtitudinal plan of care for additional care plan information)  Objective:  . Last practice recorded BP readings:  . BP Readings from Last 3 Encounters: .  09/06/19 . 133/78 .  06/05/19 . 123/82 .  04/03/19 . 129/74 .     [name] Most recent eGFR/CrCl: No results found for: EGFR  No components found for: CRCL  Current Barriers:  [name] Knowledge Deficits related to basic understanding of hypertension pathophysiology and self care management . Literacy barriers . Limited Social Support . Film/video editor.   Case Manager Clinical Goal(s):  [name] Over the next 120 days, patient will verbalize understanding of plan for hypertension and hyperlipidemia management . Over the next 120 days, patient will attend all scheduled medical appointments: 12-07-2019 with pcp, 11-27-2019 with  cardiolgoist  . Over the next 120 days, patient will demonstrate improved adherence to prescribed treatment plan for hypertension as evidenced by taking all medications as prescribed, monitoring and recording blood pressure as directed, adhering to low sodium/DASH diet . Over the next 120 days, patient will demonstrate improved health management independence as evidenced by checking blood pressure as directed and notifying PCP if SBP>160 or DBP > 90, taking all medications as prescribe, and adhering to a low sodium diet as discussed. . Over the next 120 days, patient will verbalize basic understanding of hypertension disease process and self health management plan as evidenced by positive lifestyle changes and working with CCM team to achieve health and wellness goals.   Interventions:  . Evaluation of current treatment plan related to hypertension self management and patient's adherence to plan as established by provider.11-14-2019: the patient states that he is doing good and trying to watch what he eats. The patient denies any issues related to his HTN or HLD.  [name] Provided education to patient re: stroke prevention, s/s of heart attack and stroke, DASH diet, complications of uncontrolled blood pressure . Reviewed medications with patient and discussed importance of compliance.  The patient did a complete review with the pharm D in co visit today when in the office.  09-12-2019 Provided information for the new CCM team pharmacist Almyra Free. The patient has his Ozempic and is taking as prescribed. Will call the CCM team for education and support. 11-14-2019: the patient states that he is compliant with his medications regimen  and has not new concerns related to medications at this time.  . Discussed plans with patient for ongoing care management follow up and provided patient with direct contact information for care management team . Reviewed scheduled/upcoming provider appointments including: 12-07-2019 with  pcp  Patient Self Care Activities:  . UNABLE to independently manage HTN and HLD . Follows a low sodium diet/DASH diet  Please see past updates related to this goal by clicking on the "Past Updates" button in the selected goal       .  RNCM: Pt-"I don't know how to use my meter" (pt-stated)        CARE PLAN ENTRY (see longtitudinal plan of care for additional care plan information)  Objective:  . Lab Results .  Component . Value . Date .   [name] HGBA1C . 7.1 (H) . 09/06/2019 .    [name] Lab Results .  Component . Value . Date .   [name] CREATININE . 1.10 . 09/06/2019 .   [name] BUN . 19 . 09/06/2019 .   . NA . 138 . 09/06/2019 .   . K . 4.4 . 09/06/2019 .   . CL . 97 . 09/06/2019 .   [name] CO2 . 26 . 09/06/2019 .    [name] No results found for: EGFR  Current Barriers:  [name] Knowledge Deficits related to basic Diabetes pathophysiology and self care/management as evidence of elevated hemoglobin A1C of 8.6- decrease of A1C to 7.6 in the office today: 06-05-2019. Further decreased to 7.1 in the office on 09-06-2019 . Knowledge Deficits related to medications used for management of diabetes . Film/video editor . Literacy barriers . Knowledge Deficits related to self administration of injectable diabetes medications (ozempic dose was not increased when he was supposed to- is increasing to 0.5 today 05/15/2019) . Does not use cbg meter- knowledge deficit related to how to use glucose meter. The patient has 2 glucose meters but is unsure how to use them. The patient is bringing the meter into the office May 18th- will have hands on teaching. This has been completed on today's visit. . Limited Social Support  Case Manager Clinical Goal(s):  Over the next 120 days, patient will demonstrate improved adherence to prescribed treatment plan for diabetes self care/management as evidenced by:  . daily monitoring and recording of CBG.  .  . adherence to ADA/ carb modified diet . exercise 5 days/week . adherence to  prescribed medication regimen  Interventions:  . Provided education to patient about basic DM disease process . Reviewed medications with patient and discussed importance of medication adherence.  The patient has increased his Ozempic dose to 0.73m and will see Dr. JWynetta Emerytoday for possibly new dose. 09-12-2019: The patient had called about a 90 day supply of Ozempic and left messages. Today the patient verbalized he had his medications and he thinks that Dr. JWynetta Emerygot the refill fixed to 90 days. Education and support given. 11-14-2019: The patient is down to his last box. Review and the patient does have a refill.  The patient endorses taking Ozempic as directed.  . Education on what hyperglycemia and hypoglycemia signs and symptoms are. 11-14-2019: the patient states that his sugars have been up and down and he has had some lows. Review of lows and the patient states it was 110 and 112.  The patient denied any weakness, sweating, changes in mental status. Education on normal blood sugar ranges.  The patient states depending on what he eats is what  his blood sugars are.  . Praised the patient for getting hemoglobin A1C down to 7.6.  09-12-2019: In the office on 09-06-2019 his hemoglobin A1C was 7.1- praised the patient for positive changes in his health. 11-14-2019: The patient will likely have new A1C drawn at the next MD visit. Review of goal of A1C <7.0.  . Discussed plans with patient for ongoing care management follow up and provided patient with direct contact information for care management team . Reviewed scheduled/upcoming provider appointments including: 12-07-2019 with the pcp  . Advised patient, providing education and rationale, to check cbg BID and record, calling the pcp for blood sugars out of range. The patient feels he is doing the best he can. He says it is really hard to keep up with everything he is doing. Encouraged the patient to keep moving forward in his health and well being.   [name] Extensive education on ADA/carb modified diet. The patient likes to drink sweet tea. Discussed using sugar substitutes.  The patient verbalized he does like to drink water. Talked about flavored waters like "clear American", diet lipton teas, diet drinks- the patient does not like diet drinks but is open to trying the diet lipton tea options. Encouraged the patient to explore sugar free beverages to help with controlling his diabetes. The patient states it is hard to eat right all the time. Will provide written educational material for the patient and send via mail. The patient endorses drinking a lot of water and likes the diet lipton tea.    Patient Self Care Activities:  . UNABLE to independently manage diabetes  . Self administers injectable DM medication (Ozempic) as prescribed . Checks blood sugars as prescribed and utilize hyper and hypoglycemia protocol as needed . Adheres to prescribed ADA/carb modified  Please see past updates related to this goal by clicking on the "Past Updates" button in the selected goal          Telephone follow up appointment with care management team member scheduled for:01-09-2020 at 12:15 pm  Noreene Larsson RN, MSN, [facility] Mobile: [phone]

## 2019-11-14 NOTE — Patient Instructions (Signed)
Visit Information  Goals Addressed              This Visit's Progress   .  RNCM: "I don't take my blood pressure at home" (pt-stated)        CARE PLAN ENTRY (see longtitudinal plan of care for additional care plan information)  Objective:  . Last practice recorded BP readings:  . BP Readings from Last 3 Encounters: .  09/06/19 . 133/78 .  06/05/19 . 123/82 .  04/03/19 . 129/74 .     [name] Most recent eGFR/CrCl: No results found for: EGFR  No components found for: CRCL  Current Barriers:  [name] Knowledge Deficits related to basic understanding of hypertension pathophysiology and self care management . Literacy barriers . Limited Social Support . Film/video editor.   Case Manager Clinical Goal(s):  [name] Over the next 120 days, patient will verbalize understanding of plan for hypertension and hyperlipidemia management . Over the next 120 days, patient will attend all scheduled medical appointments: 12-07-2019 with pcp, 11-27-2019 with cardiolgoist  . Over the next 120 days, patient will demonstrate improved adherence to prescribed treatment plan for hypertension as evidenced by taking all medications as prescribed, monitoring and recording blood pressure as directed, adhering to low sodium/DASH diet . Over the next 120 days, patient will demonstrate improved health management independence as evidenced by checking blood pressure as directed and notifying PCP if SBP>160 or DBP > 90, taking all medications as prescribe, and adhering to a low sodium diet as discussed. . Over the next 120 days, patient will verbalize basic understanding of hypertension disease process and self health management plan as evidenced by positive lifestyle changes and working with CCM team to achieve health and wellness goals.   Interventions:  . Evaluation of current treatment plan related to hypertension self management and patient's adherence to plan as established by provider.11-14-2019: the patient states that he  is doing good and trying to watch what he eats. The patient denies any issues related to his HTN or HLD.  [name] Provided education to patient re: stroke prevention, s/s of heart attack and stroke, DASH diet, complications of uncontrolled blood pressure . Reviewed medications with patient and discussed importance of compliance.  The patient did a complete review with the pharm D in co visit today when in the office.  09-12-2019 Provided information for the new CCM team pharmacist Almyra Free. The patient has his Ozempic and is taking as prescribed. Will call the CCM team for education and support. 11-14-2019: the patient states that he is compliant with his medications regimen and has not new concerns related to medications at this time.  . Discussed plans with patient for ongoing care management follow up and provided patient with direct contact information for care management team . Reviewed scheduled/upcoming provider appointments including: 12-07-2019 with pcp  Patient Self Care Activities:  . UNABLE to independently manage HTN and HLD . Follows a low sodium diet/DASH diet  Please see past updates related to this goal by clicking on the "Past Updates" button in the selected goal       .  RNCM: Pt-"I don't know how to use my meter" (pt-stated)        CARE PLAN ENTRY (see longtitudinal plan of care for additional care plan information)  Objective:  . Lab Results .  Component . Value . Date .   [name] HGBA1C . 7.1 (H) . 09/06/2019 .    [name] Lab Results .  Component . Value . Date .   [name]  CREATININE . 1.10 . 09/06/2019 .   [name] BUN . 19 . 09/06/2019 .   . NA . 138 . 09/06/2019 .   . K . 4.4 . 09/06/2019 .   . CL . 97 . 09/06/2019 .   [name] CO2 . 26 . 09/06/2019 .    [name] No results found for: EGFR  Current Barriers:  [name] Knowledge Deficits related to basic Diabetes pathophysiology and self care/management as evidence of elevated hemoglobin A1C of 8.6- decrease of A1C to 7.6 in the office today: 06-05-2019. Further  decreased to 7.1 in the office on 09-06-2019 . Knowledge Deficits related to medications used for management of diabetes . Film/video editor . Literacy barriers . Knowledge Deficits related to self administration of injectable diabetes medications (ozempic dose was not increased when he was supposed to- is increasing to 0.5 today 05/15/2019) . Does not use cbg meter- knowledge deficit related to how to use glucose meter. The patient has 2 glucose meters but is unsure how to use them. The patient is bringing the meter into the office May 18th- will have hands on teaching. This has been completed on today's visit. . Limited Social Support  Case Manager Clinical Goal(s):  Over the next 120 days, patient will demonstrate improved adherence to prescribed treatment plan for diabetes self care/management as evidenced by:  . daily monitoring and recording of CBG.  .  . adherence to ADA/ carb modified diet . exercise 5 days/week . adherence to prescribed medication regimen  Interventions:  . Provided education to patient about basic DM disease process . Reviewed medications with patient and discussed importance of medication adherence.  The patient has increased his Ozempic dose to 0.$[id number]'[id number]$  and will see Dr. Wynetta Emery today for possibly new dose. 09-12-2019: The patient had called about a 90 day supply of Ozempic and left messages. Today the patient verbalized he had his medications and he thinks that Dr. Wynetta Emery got the refill fixed to 90 days. Education and support given. 11-14-2019: The patient is down to his last box. Review and the patient does have a refill.  The patient endorses taking Ozempic as directed.  . Education on what hyperglycemia and hypoglycemia signs and symptoms are. 11-14-2019: the patient states that his sugars have been up and down and he has had some lows. Review of lows and the patient states it was 110 and 112.  The patient denied any weakness, sweating, changes in mental status.  Education on normal blood sugar ranges.  The patient states depending on what he eats is what his blood sugars are.  . Praised the patient for getting hemoglobin A1C down to 7.6.  09-12-2019: In the office on 09-06-2019 his hemoglobin A1C was 7.1- praised the patient for positive changes in his health. 11-14-2019: The patient will likely have new A1C drawn at the next MD visit. Review of goal of A1C <7.0.  . Discussed plans with patient for ongoing care management follow up and provided patient with direct contact information for care management team . Reviewed scheduled/upcoming provider appointments including: 12-07-2019 with the pcp  . Advised patient, providing education and rationale, to check cbg BID and record, calling the pcp for blood sugars out of range. The patient feels he is doing the best he can. He says it is really hard to keep up with everything he is doing. Encouraged the patient to keep moving forward in his health and well being.  [name] Extensive education on ADA/carb modified diet. The patient likes to drink  sweet tea. Discussed using sugar substitutes.  The patient verbalized he does like to drink water. Talked about flavored waters like "clear American", diet lipton teas, diet drinks- the patient does not like diet drinks but is open to trying the diet lipton tea options. Encouraged the patient to explore sugar free beverages to help with controlling his diabetes. The patient states it is hard to eat right all the time. Will provide written educational material for the patient and send via mail. The patient endorses drinking a lot of water and likes the diet lipton tea.    Patient Self Care Activities:  . UNABLE to independently manage diabetes  . Self administers injectable DM medication (Ozempic) as prescribed . Checks blood sugars as prescribed and utilize hyper and hypoglycemia protocol as needed . Adheres to prescribed ADA/carb modified  Please see past updates related to this goal  by clicking on the "Past Updates" button in the selected goal         Patient verbalizes understanding of instructions provided today.   Telephone follow up appointment with care management team member scheduled for: 01-09-2020 at 12:15 pm  [place], MSN, [facility] Mobile: [phone]

## 2019-12-07 ENCOUNTER — Encounter: Payer: Self-pay | Admitting: Family Medicine

## 2019-12-07 ENCOUNTER — Other Ambulatory Visit: Payer: Self-pay

## 2019-12-07 ENCOUNTER — Ambulatory Visit (INDEPENDENT_AMBULATORY_CARE_PROVIDER_SITE_OTHER): Payer: BLUE CROSS/BLUE SHIELD | Admitting: Family Medicine

## 2019-12-07 VITALS — BP 104/66 | HR 81 | Temp 97.4°F | Ht 68.58 in | Wt 244.8 lb

## 2019-12-07 DIAGNOSIS — I10 Essential (primary) hypertension: Secondary | ICD-10-CM | POA: Diagnosis not present

## 2019-12-07 DIAGNOSIS — N183 Chronic kidney disease, stage 3 unspecified: Secondary | ICD-10-CM

## 2019-12-07 DIAGNOSIS — E1122 Type 2 diabetes mellitus with diabetic chronic kidney disease: Secondary | ICD-10-CM

## 2019-12-07 LAB — BAYER DCA HB A1C WAIVED: HB A1C (BAYER DCA - WAIVED): 6.4 % (ref <7.0)

## 2019-12-07 NOTE — Assessment & Plan Note (Signed)
Overtreated. Will stop 12.5mg  HCTZ and recheck 3 months. Call with any concerns.

## 2019-12-07 NOTE — Progress Notes (Signed)
BP 104/66   Pulse 81   Temp (!) 97.4 F (36.3 C) (Oral)   Ht 5' 8.58" (1.742 m)   Wt 244 lb 12.8 oz (111 kg)   SpO2 98%   BMI 36.59 kg/m    Subjective:    Patient ID: Joshua Lee, male    DOB: [birth year], 54 y.o.   MRN: [id number]  HPI: Joshua Lee is a 54 y.o. male  Chief Complaint  Patient presents with  . Hypertension  . Diabetes   DIABETES Hypoglycemic episodes:no Polydipsia/polyuria: no Visual disturbance: no Chest pain: no Paresthesias: no Glucose Monitoring: no  Accucheck frequency: Not Checking Taking Insulin?: no Blood Pressure Monitoring: not checking Retinal Examination: Up to Date Foot Exam: Up to Date Diabetic Education: Completed Pneumovax: Up to Date Influenza: Up to Date Aspirin: yes  HYPERTENSION Hypertension status: overtreated  Satisfied with current treatment? yes Duration of hypertension: chronic BP monitoring frequency:  not checking BP medication side effects:  no Medication compliance: excellent compliance Aspirin: yes Recurrent headaches: no Visual changes: no Palpitations: no Dyspnea: no Chest pain: no Lower extremity edema: no Dizzy/lightheaded: yes   Relevant past medical, surgical, family and social history reviewed and updated as indicated. Interim medical history since our last visit reviewed. Allergies and medications reviewed and updated.  Review of Systems  Constitutional: Negative.   Respiratory: Negative.   Cardiovascular: Negative.   Gastrointestinal: Negative.   Musculoskeletal: Negative.   Neurological: Positive for dizziness and light-headedness. Negative for tremors, seizures, syncope, facial asymmetry, speech difficulty, weakness, numbness and headaches.  Psychiatric/Behavioral: Negative.     Per HPI unless specifically indicated above     Objective:    BP 104/66   Pulse 81   Temp (!) 97.4 F (36.3 C) (Oral)   Ht 5' 8.58" (1.742 m)   Wt 244 lb 12.8 oz (111 kg)   SpO2 98%   BMI 36.59  kg/m   Wt Readings from Last 3 Encounters:  12/07/19 244 lb 12.8 oz (111 kg)  09/06/19 247 lb (112 kg)  06/05/19 252 lb (114.3 kg)    Physical Exam Vitals and nursing note reviewed.  Constitutional:      General: He is not in acute distress.    Appearance: Normal appearance. He is not ill-appearing, toxic-appearing or diaphoretic.  HENT:     Head: Normocephalic and atraumatic.     Right Ear: External ear normal.     Left Ear: External ear normal.     Nose: Nose normal.     Mouth/Throat:     Mouth: Mucous membranes are moist.     Pharynx: Oropharynx is clear.  Eyes:     General: No scleral icterus.       Right eye: No discharge.        Left eye: No discharge.     Extraocular Movements: Extraocular movements intact.     Conjunctiva/sclera: Conjunctivae normal.     Pupils: Pupils are equal, round, and reactive to light.  Cardiovascular:     Rate and Rhythm: Normal rate and regular rhythm.     Pulses: Normal pulses.     Heart sounds: Normal heart sounds. No murmur heard.  No friction rub. No gallop.   Pulmonary:     Effort: Pulmonary effort is normal. No respiratory distress.     Breath sounds: Normal breath sounds. No stridor. No wheezing, rhonchi or rales.  Chest:     Chest wall: No tenderness.  Musculoskeletal:  General: Normal range of motion.     Cervical back: Normal range of motion and neck supple.  Skin:    General: Skin is warm and dry.     Capillary Refill: Capillary refill takes less than 2 seconds.     Coloration: Skin is not jaundiced or pale.     Findings: No bruising, erythema, lesion or rash.  Neurological:     General: No focal deficit present.     Mental Status: He is alert and oriented to person, place, and time. Mental status is at baseline.  Psychiatric:        Mood and Affect: Mood normal.        Behavior: Behavior normal.        Thought Content: Thought content normal.        Judgment: Judgment normal.     Results for orders placed or  performed in visit on 09/06/19  Bayer DCA Hb A1c Waived  Result Value Ref Range   HB A1C (BAYER DCA - WAIVED) 7.1 (H) <7.0 %  CBC with Differential/Platelet  Result Value Ref Range   WBC 9.0 3.4 - 10.8 x10E3/uL   RBC 5.04 4.14 - 5.80 x10E6/uL   Hemoglobin 14.2 13.0 - 17.7 g/dL   Hematocrit 70.0 17.4 - 51.0 %   MCV 85 79 - 97 fL   MCH 28.2 26.6 - 33.0 pg   MCHC 33.0 31 - 35 g/dL   RDW 94.4 96.7 - 59.1 %   Platelets 239 150 - 450 x10E3/uL   Neutrophils 80 Not Estab. %   Lymphs 12 Not Estab. %   Monocytes 7 Not Estab. %   Eos 1 Not Estab. %   Basos 0 Not Estab. %   Neutrophils Absolute 7.1 (H) 1.40 - 7.00 x10E3/uL   Lymphocytes Absolute 1.1 0 - 3 x10E3/uL   Monocytes Absolute 0.7 0 - 0 x10E3/uL   EOS (ABSOLUTE) 0.1 0.0 - 0.4 x10E3/uL   Basophils Absolute 0.0 0 - 0 x10E3/uL   Immature Granulocytes 0 Not Estab. %   Immature Grans (Abs) 0.0 0.0 - 0.1 x10E3/uL  Comprehensive metabolic panel  Result Value Ref Range   Glucose 167 (H) 65 - 99 mg/dL   BUN 19 6 - 24 mg/dL   Creatinine, Ser 6.38 0.76 - 1.27 mg/dL   GFR calc non Af Amer 76 >59 mL/min/1.73   GFR calc Af Amer 88 >59 mL/min/1.73   BUN/Creatinine Ratio 17 9 - 20   Sodium 138 134 - 144 mmol/L   Potassium 4.4 3.5 - 5.2 mmol/L   Chloride 97 96 - 106 mmol/L   CO2 26 20 - 29 mmol/L   Calcium 9.4 8.7 - 10.2 mg/dL   Total Protein 6.6 6.0 - 8.5 g/dL   Albumin 4.2 3.8 - 4.9 g/dL   Globulin, Total 2.4 1.5 - 4.5 g/dL   Albumin/Globulin Ratio 1.8 1.2 - 2.2   Bilirubin Total 0.7 0.0 - 1.2 mg/dL   Alkaline Phosphatase 47 (L) 48 - 121 IU/L   AST 16 0 - 40 IU/L   ALT 17 0 - 44 IU/L  Lipid Panel w/o Chol/HDL Ratio  Result Value Ref Range   Cholesterol, Total 94 (L) 100 - 199 mg/dL   Triglycerides 466 (H) 0 - 149 mg/dL   HDL 22 (L) >59 mg/dL   VLDL Cholesterol Cal 39 5 - 40 mg/dL   LDL Chol Calc (NIH) 33 0 - 99 mg/dL  Microalbumin, Urine Waived  Result Value Ref Range  Microalb, Ur Waived 10 0 - 19 mg/L   Creatinine, Urine  Waived 10 10 - 300 mg/dL   Microalb/Creat Ratio 30-300 (H) <30 mg/g  PSA  Result Value Ref Range   Prostate Specific Ag, Serum 0.4 0.0 - 4.0 ng/mL  TSH  Result Value Ref Range   TSH 1.910 0.450 - 4.500 uIU/mL  Urinalysis, Routine w reflex microscopic  Result Value Ref Range   Specific Gravity, UA 1.010 1.005 - 1.030   pH, UA 5.0 5.0 - 7.5   Color, UA Yellow Yellow   Appearance Ur Clear Clear   Leukocytes,UA Negative Negative   Protein,UA Negative Negative/Trace   Glucose, UA 3+ (A) Negative   Ketones, UA Negative Negative   RBC, UA Negative Negative   Bilirubin, UA Negative Negative   Urobilinogen, Ur 0.2 0.2 - 1.0 mg/dL   Nitrite, UA Negative Negative      Assessment & Plan:   Problem List Items Addressed This Visit      Cardiovascular and Mediastinum   Hypertension    Overtreated. Will stop 12.5mg  HCTZ and recheck 3 months. Call with any concerns.         Endocrine   Type 2 diabetes mellitus with stage 3 chronic kidney disease, without long-term current use of insulin (HCC) - Primary    Doing great with A1c of 6.4. Continue current regimen. Continue to monitor. Recheck 3 months. Refills up to date.       Relevant Orders   Bayer DCA Hb A1c Waived       Follow up plan: Return in about 3 months (around 03/08/2020).

## 2019-12-07 NOTE — Assessment & Plan Note (Signed)
Doing great with A1c of 6.4. Continue current regimen. Continue to monitor. Recheck 3 months. Refills up to date.

## 2019-12-07 NOTE — Patient Instructions (Signed)
Stop the Hydrochlorothizide

## 2020-01-09 ENCOUNTER — Ambulatory Visit: Payer: Self-pay | Admitting: General Practice

## 2020-01-09 ENCOUNTER — Telehealth: Payer: Self-pay | Admitting: General Practice

## 2020-01-09 DIAGNOSIS — N183 Chronic kidney disease, stage 3 unspecified: Secondary | ICD-10-CM

## 2020-01-09 DIAGNOSIS — E782 Mixed hyperlipidemia: Secondary | ICD-10-CM

## 2020-01-09 DIAGNOSIS — I10 Essential (primary) hypertension: Secondary | ICD-10-CM

## 2020-01-09 NOTE — Patient Instructions (Signed)
Visit Information  Goals Addressed              This Visit's Progress     RNCM: "I don't take my blood pressure at home" (pt-stated)        CARE PLAN ENTRY (see longtitudinal plan of care for additional care plan information)  Objective:   Last practice recorded BP readings:   BP Readings from Last 3 Encounters:   12/07/19  104/66   09/06/19  133/78   06/05/19  123/82        Most recent eGFR/CrCl: No results found for: EGFR  No components found for: CRCL  Current Barriers:   Knowledge Deficits related to basic understanding of hypertension pathophysiology and self care management  Literacy barriers  Limited Social Lawyer.   Case Manager Clinical Goal(s):   Over the next 120 days, patient will verbalize understanding of plan for hypertension and hyperlipidemia management  Over the next 120 days, patient will attend all scheduled medical appointments: 12-07-2019 with pcp, 11-27-2019 with cardiolgoist   Over the next 120 days, patient will demonstrate improved adherence to prescribed treatment plan for hypertension as evidenced by taking all medications as prescribed, monitoring and recording blood pressure as directed, adhering to low sodium/DASH diet  Over the next 120 days, patient will demonstrate improved health management independence as evidenced by checking blood pressure as directed and notifying PCP if SBP>160 or DBP > 90, taking all medications as prescribe, and adhering to a low sodium diet as discussed.  Over the next 120 days, patient will verbalize basic understanding of hypertension disease process and self health management plan as evidenced by positive lifestyle changes and working with CCM team to achieve health and wellness goals.   Interventions:   Evaluation of current treatment plan related to hypertension self management and patient's adherence to plan as established by provider.01-09-2020: the patient states that he  is doing good and trying to watch what he eats. The patient denies any issues related to his HTN or HLD.   Provided education to patient re: stroke prevention, s/s of heart attack and stroke, DASH diet, complications of uncontrolled blood pressure.  01-09-2020: Reminded the patient of watching for hidden sodium in foods especially with the holidays. The patient is excited about Christmas and spending time with his mom.   Reviewed medications with patient and discussed importance of compliance.  The patient did a complete review with the pharm D in co visit today when in the office.  09-12-2019 Provided information for the new CCM team pharmacist Almyra Free. The patient has his Ozempic and is taking as prescribed. Will call the CCM team for education and support. 01-09-2020: the patient states that he is compliant with his medications regimen and has not new concerns related to medications at this time. States that he has been taken off of the hydrochlorothiazide and is doing well with this change.   Discussed plans with patient for ongoing care management follow up and provided patient with direct contact information for care management team  Reviewed scheduled/upcoming provider appointments including: 02-28-2020 at 0940 am with pcp  Patient Self Care Activities:   UNABLE to independently manage HTN and HLD  Follows a low sodium diet/DASH diet  Please see past updates related to this goal by clicking on the "Past Updates" button in the selected goal         RNCM: Pt-"I don't know how to use my meter" (pt-stated)  CARE PLAN ENTRY (see longtitudinal plan of care for additional care plan information)  Objective:   Lab Results   Component  Value  Date     HGBA1C  6.4  12/07/2019       Lab Results   Component  Value  Date     CREATININE  1.10  09/06/2019     BUN  19  09/06/2019     NA  138  09/06/2019     K  4.4  09/06/2019     CL  97  09/06/2019      CO2  26  09/06/2019       No results found for: EGFR  Current Barriers:   Knowledge Deficits related to basic Diabetes pathophysiology and self care/management as evidence of elevated hemoglobin A1C of 8.6- decrease of A1C to 7.6 in the office today: 06-05-2019. Further decreased to 7.1 in the office on 09-06-2019. Most recent hemoglobin A1C is 6.4 on 12-07-2019.   Knowledge Deficits related to medications used for management of diabetes  Financial Constraints  Literacy barriers  Knowledge Deficits related to self administration of injectable diabetes medications (ozempic dose was not increased when he was supposed to- is increasing to 0.5 today 05/15/2019)  Does not use cbg meter- knowledge deficit related to how to use glucose meter. The patient has 2 glucose meters but is unsure how to use them. The patient is bringing the meter into the office May 18th- will have hands on teaching. This has been completed on today's visit. Completed and using consistently now.   Limited Social Support  Case Manager Clinical Goal(s):  Over the next 120 days, patient will demonstrate improved adherence to prescribed treatment plan for diabetes self care/management as evidenced by:   daily monitoring and recording of CBG.  [name]   adherence to ADA/ carb modified diet  exercise 5 days/week  adherence to prescribed medication regimen  Interventions:   Provided education to patient about basic DM disease process  Reviewed medications with patient and discussed importance of medication adherence.  The patient has increased his Ozempic dose to 0.52m and will see Dr. JWynetta Emerytoday for possibly new dose. 09-12-2019: The patient had called about a 90 day supply of Ozempic and left messages. Today the patient verbalized he had his medications and he thinks that Dr. JWynetta Emerygot the refill fixed to 90 days. Education and support given. 01-09-2020: The patient has a 2 month supply of Ozempic. The patient wanted  to know if he had to do new paperwork to get Ozempic for $25.00.  Education provided and will have the CCM pharmacist contact the patient in the new year for help with questions related to [place]  Review and the patient does have a refill.  The patient endorses taking Ozempic as directed.   Education on what hyperglycemia and hypoglycemia signs and symptoms are. 01-09-2020: the patient states that his sugars have been up and down and he has had some lows. Review of lows and the patient states it was 100's.  The patient denied any weakness, sweating, changes in mental status. Education on normal blood sugar ranges.  The patient states depending on what he eats is what his blood sugars are. Blood sugar this am was 137.   Praised the patient for getting hemoglobin A1C down to 7.6.  09-12-2019: In the office on 09-06-2019 his hemoglobin A1C was 7.1- praised the patient for positive changes in his health. 11-14-2019: The patient will likely have new A1C drawn at  the next MD visit. Review of goal of A1C <7.0. 01-09-2020: The patients most recent hemoglobin A1C was 6.4 on 12-07-2019. Praised the patient for doing well with managing his blood sugars and keeping A1C under <7.0.  Discussed plans with patient for ongoing care management follow up and provided patient with direct contact information for care management team  Reviewed scheduled/upcoming provider appointments including: 02-28-2020 with the pcp   Advised patient, providing education and rationale, to check cbg BID and record, calling the pcp for blood sugars out of range. The patient feels he is doing the best he can. He says it is really hard to keep up with everything he is doing. Encouraged the patient to keep moving forward in his health and well being.   Extensive education on ADA/carb modified diet. The patient likes to drink sweet tea. Discussed using sugar substitutes.  The patient verbalized he does like to drink water. Talked about flavored  waters like "clear American", diet lipton teas, diet drinks- the patient does not like diet drinks but is open to trying the diet lipton tea options. Encouraged the patient to explore sugar free beverages to help with controlling his diabetes. The patient states it is hard to eat right all the time. Will provide written educational material for the patient and send via mail. The patient endorses drinking a lot of water and likes the diet lipton tea.    Patient Self Care Activities:   UNABLE to independently manage diabetes   Self administers injectable DM medication (Ozempic) as prescribed  Checks blood sugars as prescribed and utilize hyper and hypoglycemia protocol as needed  Adheres to prescribed ADA/carb modified  Please see past updates related to this goal by clicking on the "Past Updates" button in the selected goal         The patient verbalized understanding of instructions, educational materials, and care plan provided today and declined offer to receive copy of patient instructions, educational materials, and care plan.   Telephone follow up appointment with care management team member scheduled for: 03-12-2020 1:45 pm  Noreene Larsson RN, MSN, [facility] Mobile: [phone]

## 2020-01-09 NOTE — Chronic Care Management (AMB) (Signed)
Care Management   Follow Up Note   01/09/2020 Name: Joshua Lee MRN: [id number] DOB: February 16, 1965  Joshua Lee is enrolled in a Managed Medicaid plan: No. Outreach attempt today was successful.    Referred by: [name], DO Reason for referral : Care Coordination (RNCM Follow up call for Chronic Disease Management and Care Coordination Needs)   Joshua Lee is a 54 y.o. year old male who is a primary care patient of [name], DO. The care management team was consulted for assistance with care management and care coordination needs.    Review of patient status, including review of consultants reports, relevant laboratory and other test results, and collaboration with appropriate care team members and the patient's provider was performed as part of comprehensive patient evaluation and provision of chronic care management services.    Goals Addressed              This Visit's Progress   .  RNCM: "I don't take my blood pressure at home" (pt-stated)        CARE PLAN ENTRY (see longtitudinal plan of care for additional care plan information)  Objective:  . Last practice recorded BP readings:  . BP Readings from Last 3 Encounters: .  12/07/19 . 104/66 .  09/06/19 . 133/78 .  06/05/19 . 123/82 .      [name] Most recent eGFR/CrCl: No results found for: EGFR  No components found for: CRCL  Current Barriers:  [name] Knowledge Deficits related to basic understanding of hypertension pathophysiology and self care management . Literacy barriers . Limited Social Support . Film/video editor.   Case Manager Clinical Goal(s):  [name] Over the next 120 days, patient will verbalize understanding of plan for hypertension and hyperlipidemia management . Over the next 120 days, patient will attend all scheduled medical appointments: 12-07-2019 with pcp, 11-27-2019 with cardiolgoist  . Over the next 120 days, patient will demonstrate improved adherence to prescribed treatment  plan for hypertension as evidenced by taking all medications as prescribed, monitoring and recording blood pressure as directed, adhering to low sodium/DASH diet . Over the next 120 days, patient will demonstrate improved health management independence as evidenced by checking blood pressure as directed and notifying PCP if SBP>160 or DBP > 90, taking all medications as prescribe, and adhering to a low sodium diet as discussed. . Over the next 120 days, patient will verbalize basic understanding of hypertension disease process and self health management plan as evidenced by positive lifestyle changes and working with CCM team to achieve health and wellness goals.   Interventions:  . Evaluation of current treatment plan related to hypertension self management and patient's adherence to plan as established by provider.01-09-2020: the patient states that he is doing good and trying to watch what he eats. The patient denies any issues related to his HTN or HLD.  [name] Provided education to patient re: stroke prevention, s/s of heart attack and stroke, DASH diet, complications of uncontrolled blood pressure.  01-09-2020: Reminded the patient of watching for hidden sodium in foods especially with the holidays. The patient is excited about Christmas and spending time with his mom.  . Reviewed medications with patient and discussed importance of compliance.  The patient did a complete review with the pharm D in co visit today when in the office.  09-12-2019 Provided information for the new CCM team pharmacist Almyra Free. The patient has his Ozempic and is taking as prescribed. Will call the CCM team for  education and support. 01-09-2020: the patient states that he is compliant with his medications regimen and has not new concerns related to medications at this time. States that he has been taken off of the hydrochlorothiazide and is doing well with this change.  . Discussed plans with patient for ongoing care management  follow up and provided patient with direct contact information for care management team . Reviewed scheduled/upcoming provider appointments including: 02-28-2020 at 0940 am with pcp  Patient Self Care Activities:  . UNABLE to independently manage HTN and HLD . Follows a low sodium diet/DASH diet  Please see past updates related to this goal by clicking on the "Past Updates" button in the selected goal       .  RNCM: Pt-"I don't know how to use my meter" (pt-stated)        CARE PLAN ENTRY (see longtitudinal plan of care for additional care plan information)  Objective:  . Lab Results .  Component . Value . Date .   [name] HGBA1C . 6.4 . 12/07/2019 .     [name] Lab Results .  Component . Value . Date .   [name] CREATININE . 1.10 . 09/06/2019 .   [name] BUN . 19 . 09/06/2019 .   . NA . 138 . 09/06/2019 .   . K . 4.4 . 09/06/2019 .   . CL . 97 . 09/06/2019 .   [name] CO2 . 26 . 09/06/2019 .     [name] No results found for: EGFR  Current Barriers:  [name] Knowledge Deficits related to basic Diabetes pathophysiology and self care/management as evidence of elevated hemoglobin A1C of 8.6- decrease of A1C to 7.6 in the office today: 06-05-2019. Further decreased to 7.1 in the office on 09-06-2019. Most recent hemoglobin A1C is 6.4 on 12-07-2019.  [name] Knowledge Deficits related to medications used for management of diabetes . Film/video editor . Literacy barriers . Knowledge Deficits related to self administration of injectable diabetes medications (ozempic dose was not increased when he was supposed to- is increasing to 0.5 today 05/15/2019) . Does not use cbg meter- knowledge deficit related to how to use glucose meter. The patient has 2 glucose meters but is unsure how to use them. The patient is bringing the meter into the office May 18th- will have hands on teaching. This has been completed on today's visit. Completed and using consistently now.  . Limited Social Support  Case Manager Clinical Goal(s):  Over the next  120 days, patient will demonstrate improved adherence to prescribed treatment plan for diabetes self care/management as evidenced by:  . daily monitoring and recording of CBG.  .  . adherence to ADA/ carb modified diet . exercise 5 days/week . adherence to prescribed medication regimen  Interventions:  . Provided education to patient about basic DM disease process . Reviewed medications with patient and discussed importance of medication adherence.  The patient has increased his Ozempic dose to 0.77m and will see Dr. JWynetta Emerytoday for possibly new dose. 09-12-2019: The patient had called about a 90 day supply of Ozempic and left messages. Today the patient verbalized he had his medications and he thinks that Dr. JWynetta Emerygot the refill fixed to 90 days. Education and support given. 01-09-2020: The patient has a 2 month supply of Ozempic. The patient wanted to know if he had to do new paperwork to get Ozempic for $25.00.  Education provided and will have the CCM pharmacist contact the patient in the new year for  help with questions related to Ozempic.  Review and the patient does have a refill.  The patient endorses taking Ozempic as directed.  . Education on what hyperglycemia and hypoglycemia signs and symptoms are. 01-09-2020: the patient states that his sugars have been up and down and he has had some lows. Review of lows and the patient states it was 100's.  The patient denied any weakness, sweating, changes in mental status. Education on normal blood sugar ranges.  The patient states depending on what he eats is what his blood sugars are. Blood sugar this am was 137.  [name] Praised the patient for getting hemoglobin A1C down to 7.6.  09-12-2019: In the office on 09-06-2019 his hemoglobin A1C was 7.1- praised the patient for positive changes in his health. 11-14-2019: The patient will likely have new A1C drawn at the next MD visit. Review of goal of A1C <7.0. 01-09-2020: The patients most recent hemoglobin A1C  was 6.4 on 12-07-2019. Praised the patient for doing well with managing his blood sugars and keeping A1C under <7.0. . Discussed plans with patient for ongoing care management follow up and provided patient with direct contact information for care management team . Reviewed scheduled/upcoming provider appointments including: 02-28-2020 with the pcp  . Advised patient, providing education and rationale, to check cbg BID and record, calling the pcp for blood sugars out of range. The patient feels he is doing the best he can. He says it is really hard to keep up with everything he is doing. Encouraged the patient to keep moving forward in his health and well being.  [name] Extensive education on ADA/carb modified diet. The patient likes to drink sweet tea. Discussed using sugar substitutes.  The patient verbalized he does like to drink water. Talked about flavored waters like "clear American", diet lipton teas, diet drinks- the patient does not like diet drinks but is open to trying the diet lipton tea options. Encouraged the patient to explore sugar free beverages to help with controlling his diabetes. The patient states it is hard to eat right all the time. Will provide written educational material for the patient and send via mail. The patient endorses drinking a lot of water and likes the diet lipton tea.    Patient Self Care Activities:  . UNABLE to independently manage diabetes  . Self administers injectable DM medication (Ozempic) as prescribed . Checks blood sugars as prescribed and utilize hyper and hypoglycemia protocol as needed . Adheres to prescribed ADA/carb modified  Please see past updates related to this goal by clicking on the "Past Updates" button in the selected goal          Telephone follow up appointment with care management team member scheduled for: 03-12-2020 at 1:45 pm  Noreene Larsson RN, MSN, [facility] Mobile: [phone]

## 2020-01-10 LAB — HM DIABETES EYE EXAM

## 2020-01-16 ENCOUNTER — Other Ambulatory Visit: Payer: Self-pay | Admitting: Family Medicine

## 2020-01-17 ENCOUNTER — Telehealth: Payer: Self-pay

## 2020-01-17 NOTE — Telephone Encounter (Signed)
Medication sent in last night, patient notified.

## 2020-01-17 NOTE — Telephone Encounter (Signed)
Pt stated pharmacy did not have his ozempic Ozempic (1 MG/DOSE) OZEMPIC, 1 MG/DOSE, 4 MG/3ML SOPN  Pt stated he needs a refill but they are stating there was nothing on his file

## 2020-02-28 ENCOUNTER — Ambulatory Visit (INDEPENDENT_AMBULATORY_CARE_PROVIDER_SITE_OTHER): Payer: BLUE CROSS/BLUE SHIELD | Admitting: Family Medicine

## 2020-02-28 ENCOUNTER — Other Ambulatory Visit: Payer: Self-pay

## 2020-02-28 ENCOUNTER — Encounter: Payer: Self-pay | Admitting: Family Medicine

## 2020-02-28 VITALS — BP 115/72 | HR 76 | Temp 98.0°F | Wt 250.6 lb

## 2020-02-28 DIAGNOSIS — E1122 Type 2 diabetes mellitus with diabetic chronic kidney disease: Secondary | ICD-10-CM | POA: Diagnosis not present

## 2020-02-28 DIAGNOSIS — I10 Essential (primary) hypertension: Secondary | ICD-10-CM | POA: Diagnosis not present

## 2020-02-28 DIAGNOSIS — E782 Mixed hyperlipidemia: Secondary | ICD-10-CM

## 2020-02-28 DIAGNOSIS — N183 Chronic kidney disease, stage 3 unspecified: Secondary | ICD-10-CM

## 2020-02-28 LAB — BAYER DCA HB A1C WAIVED: HB A1C (BAYER DCA - WAIVED): 6.6 % (ref <7.0)

## 2020-02-28 MED ORDER — EMPAGLIFLOZIN 25 MG PO TABS: 25.0000 mg | ORAL_TABLET | Freq: Every day | ORAL | 1 refills | Status: DC

## 2020-02-28 MED ORDER — METOPROLOL TARTRATE 100 MG PO TABS: 100.0000 mg | ORAL_TABLET | Freq: Two times a day (BID) | ORAL | 1 refills | Status: DC

## 2020-02-28 MED ORDER — METFORMIN HCL 500 MG PO TABS: 1000.0000 mg | ORAL_TABLET | Freq: Two times a day (BID) | ORAL | 1 refills | Status: DC

## 2020-02-28 MED ORDER — TAMSULOSIN HCL 0.4 MG PO CAPS: 0.4000 mg | ORAL_CAPSULE | Freq: Every day | ORAL | 1 refills | Status: DC

## 2020-02-28 NOTE — Assessment & Plan Note (Signed)
Under good control on current regimen with a1c of 6.6. Continue current regimen. Continue to monitor. Call with any concerns. Refills given.

## 2020-02-28 NOTE — Progress Notes (Signed)
BP 115/72   Pulse 76   Temp 98 F (36.7 C) (Oral)   Wt 250 lb 9.6 oz (113.7 kg)   SpO2 95%   BMI 37.46 kg/m    Subjective:    Patient ID: Joshua Lee, male    DOB: 06-17-1965, 55 y.o.   MRN: [id number]  HPI: Joshua Lee is a 55 y.o. male  Chief Complaint  Patient presents with  . Hypertension  . Diabetes  . Hyperlipidemia   HYPERTENSION / HYPERLIPIDEMIA Satisfied with current treatment? yes Duration of hypertension: chronic BP monitoring frequency: not checking BP medication side effects: no Past BP meds: metoprolol, benazepril Duration of hyperlipidemia: chronic Cholesterol medication side effects: no Cholesterol supplements: none Past cholesterol medications: atorvastatin, vascepa Medication compliance: excellent compliance Aspirin: yes Recent stressors: no Recurrent headaches: no Visual changes: no Palpitations: no Dyspnea: no Chest pain: no Lower extremity edema: no Dizzy/lightheaded: no  DIABETES Hypoglycemic episodes:no Polydipsia/polyuria: no Visual disturbance: no Chest pain: no Paresthesias: no Glucose Monitoring: no  Accucheck frequency: Not Checking Taking Insulin?: no Blood Pressure Monitoring: not checking Retinal Examination: Up to Date Foot Exam: Up to Date Diabetic Education: Completed Pneumovax: Up to Date Influenza: Up to Date Aspirin: yes  Relevant past medical, surgical, family and social history reviewed and updated as indicated. Interim medical history since our last visit reviewed. Allergies and medications reviewed and updated.  Review of Systems  Constitutional: Negative.   Respiratory: Negative.   Cardiovascular: Negative.   Gastrointestinal: Negative.   Musculoskeletal: Negative.   Neurological: Negative.   Psychiatric/Behavioral: Negative.     Per HPI unless specifically indicated above     Objective:    BP 115/72   Pulse 76   Temp 98 F (36.7 C) (Oral)   Wt 250 lb 9.6 oz (113.7 kg)   SpO2 95%    BMI 37.46 kg/m   Wt Readings from Last 3 Encounters:  02/28/20 250 lb 9.6 oz (113.7 kg)  12/07/19 244 lb 12.8 oz (111 kg)  09/06/19 247 lb (112 kg)    Physical Exam Vitals and nursing note reviewed.  Constitutional:      General: He is not in acute distress.    Appearance: Normal appearance. He is not ill-appearing, toxic-appearing or diaphoretic.  HENT:     Head: Normocephalic and atraumatic.     Right Ear: External ear normal.     Left Ear: External ear normal.     Nose: Nose normal.     Mouth/Throat:     Mouth: Mucous membranes are moist.     Pharynx: Oropharynx is clear.  Eyes:     General: No scleral icterus.       Right eye: No discharge.        Left eye: No discharge.     Extraocular Movements: Extraocular movements intact.     Conjunctiva/sclera: Conjunctivae normal.     Pupils: Pupils are equal, round, and reactive to light.  Cardiovascular:     Rate and Rhythm: Normal rate and regular rhythm.     Pulses: Normal pulses.     Heart sounds: Normal heart sounds. No murmur heard. No friction rub. No gallop.   Pulmonary:     Effort: Pulmonary effort is normal. No respiratory distress.     Breath sounds: Normal breath sounds. No stridor. No wheezing, rhonchi or rales.  Chest:     Chest wall: No tenderness.  Musculoskeletal:        General: Normal range of motion.  Cervical back: Normal range of motion and neck supple.  Skin:    General: Skin is warm and dry.     Capillary Refill: Capillary refill takes less than 2 seconds.     Coloration: Skin is not jaundiced or pale.     Findings: No bruising, erythema, lesion or rash.  Neurological:     General: No focal deficit present.     Mental Status: He is alert and oriented to person, place, and time. Mental status is at baseline.  Psychiatric:        Mood and Affect: Mood normal.        Behavior: Behavior normal.        Thought Content: Thought content normal.        Judgment: Judgment normal.     Results for  orders placed or performed in visit on 02/28/20  Bayer DCA Hb A1c Waived  Result Value Ref Range   HB A1C (BAYER DCA - WAIVED) 6.6 <7.0 %      Assessment & Plan:   Problem List Items Addressed This Visit      Cardiovascular and Mediastinum   Hypertension    Under good control on current regimen. Continue current regimen. Continue to monitor. Call with any concerns. Refills given. Labs drawn today.       Relevant Medications   empagliflozin (JARDIANCE) 25 MG TABS tablet   metoprolol tartrate (LOPRESSOR) 100 MG tablet   Other Relevant Orders   Comprehensive metabolic panel   CBC with Differential/Platelet     Endocrine   Type 2 diabetes mellitus with stage 3 chronic kidney disease, without long-term current use of insulin (HCC) - Primary    Under good control on current regimen with a1c of 6.6. Continue current regimen. Continue to monitor. Call with any concerns. Refills given.        Relevant Medications   empagliflozin (JARDIANCE) 25 MG TABS tablet   metFORMIN (GLUCOPHAGE) 500 MG tablet   Other Relevant Orders   Comprehensive metabolic panel   CBC with Differential/Platelet   Bayer DCA Hb A1c Waived (Completed)     Other   Hyperlipidemia    Under good control on current regimen. Continue current regimen. Continue to monitor. Call with any concerns. Refills given. Labs drawn today.      Relevant Medications   metoprolol tartrate (LOPRESSOR) 100 MG tablet   Other Relevant Orders   Comprehensive metabolic panel   CBC with Differential/Platelet   Lipid Panel w/o Chol/HDL Ratio   Morbid obesity (HCC)    Encouraged diet and exercise with goal of losing 1-2lbs per week. Call with any concerns.       Relevant Medications   empagliflozin (JARDIANCE) 25 MG TABS tablet   metFORMIN (GLUCOPHAGE) 500 MG tablet    Other Visit Diagnoses    Essential hypertension       Relevant Medications   empagliflozin (JARDIANCE) 25 MG TABS tablet   metoprolol tartrate (LOPRESSOR) 100  MG tablet   Other Relevant Orders   Comprehensive metabolic panel   CBC with Differential/Platelet       Follow up plan: Return in about 6 months (around 08/27/2020) for physical.

## 2020-02-28 NOTE — Assessment & Plan Note (Signed)
Encouraged diet and exercise with goal of losing 1-2lbs per week. Call with any concerns.  

## 2020-02-28 NOTE — Assessment & Plan Note (Signed)
Under good control on current regimen. Continue current regimen. Continue to monitor. Call with any concerns. Refills given. Labs drawn today.   

## 2020-02-29 LAB — CBC WITH DIFFERENTIAL/PLATELET
Basophils Absolute: 0 10*3/uL (ref 0.0–0.2)
Basos: 0 %
EOS (ABSOLUTE): 0.1 10*3/uL (ref 0.0–0.4)
Eos: 1 %
Hematocrit: 44.4 % (ref 37.5–51.0)
Hemoglobin: 14.1 g/dL (ref 13.0–17.7)
Immature Grans (Abs): 0 10*3/uL (ref 0.0–0.1)
Immature Granulocytes: 0 %
Lymphocytes Absolute: 1.4 10*3/uL (ref 0.7–3.1)
Lymphs: 18 %
MCH: 26.1 pg — ABNORMAL LOW (ref 26.6–33.0)
MCHC: 31.8 g/dL (ref 31.5–35.7)
MCV: 82 fL (ref 79–97)
Monocytes Absolute: 0.6 10*3/uL (ref 0.1–0.9)
Monocytes: 7 %
Neutrophils Absolute: 5.6 10*3/uL (ref 1.4–7.0)
Neutrophils: 74 %
Platelets: 212 10*3/uL (ref 150–450)
RBC: 5.4 x10E6/uL (ref 4.14–5.80)
RDW: 14.6 % (ref 11.6–15.4)
WBC: 7.7 10*3/uL (ref 3.4–10.8)

## 2020-02-29 LAB — LIPID PANEL W/O CHOL/HDL RATIO
Cholesterol, Total: 98 mg/dL — ABNORMAL LOW (ref 100–199)
HDL: 25 mg/dL — ABNORMAL LOW (ref >39)
LDL Chol Calc (NIH): 44 mg/dL (ref 0–99)
Triglycerides: 173 mg/dL — ABNORMAL HIGH (ref 0–149)
VLDL Cholesterol Cal: 29 mg/dL (ref 5–40)

## 2020-02-29 LAB — COMPREHENSIVE METABOLIC PANEL
ALT: 20 IU/L (ref 0–44)
AST: 18 IU/L (ref 0–40)
Albumin/Globulin Ratio: 1.7 (ref 1.2–2.2)
Albumin: 4.3 g/dL (ref 3.8–4.9)
Alkaline Phosphatase: 55 IU/L (ref 44–121)
BUN/Creatinine Ratio: 15 (ref 9–20)
BUN: 13 mg/dL (ref 6–24)
Bilirubin Total: 0.5 mg/dL (ref 0.0–1.2)
CO2: 21 mmol/L (ref 20–29)
Calcium: 9.1 mg/dL (ref 8.7–10.2)
Chloride: 98 mmol/L (ref 96–106)
Creatinine, Ser: 0.86 mg/dL (ref 0.76–1.27)
GFR calc Af Amer: 114 mL/min/{1.73_m2} (ref >59)
GFR calc non Af Amer: 98 mL/min/{1.73_m2} (ref >59)
Globulin, Total: 2.6 g/dL (ref 1.5–4.5)
Glucose: 202 mg/dL — ABNORMAL HIGH (ref 65–99)
Potassium: 4.3 mmol/L (ref 3.5–5.2)
Sodium: 136 mmol/L (ref 134–144)
Total Protein: 6.9 g/dL (ref 6.0–8.5)

## 2020-03-12 ENCOUNTER — Ambulatory Visit: Payer: Self-pay | Admitting: General Practice

## 2020-03-12 ENCOUNTER — Telehealth: Payer: Self-pay | Admitting: General Practice

## 2020-03-12 DIAGNOSIS — N183 Chronic kidney disease, stage 3 unspecified: Secondary | ICD-10-CM

## 2020-03-12 DIAGNOSIS — E1122 Type 2 diabetes mellitus with diabetic chronic kidney disease: Secondary | ICD-10-CM

## 2020-03-12 DIAGNOSIS — E782 Mixed hyperlipidemia: Secondary | ICD-10-CM

## 2020-03-12 NOTE — Chronic Care Management (AMB) (Signed)
Care Management    RN Visit Note  03/12/2020 Name: Joshua Lee MRN: [id number] DOB: 1965-09-09  Subjective: Joshua Lee is a 55 y.o. year old male who is a primary care patient of [name], DO. The care management team was consulted for assistance with disease management and care coordination needs.    Engaged with patient by telephone for follow up visit in response to provider referral for case management and/or care coordination services.   Consent to Services:   Mr. Resnick was given information about Care Management services today including:  1. Care Management services includes personalized support from designated clinical staff supervised by his physician, including individualized plan of care and coordination with other care providers 2. 24/7 contact phone numbers for assistance for urgent and routine care needs. 3. The patient may stop case management services at any time by phone call to the office staff.  Patient agreed to services and consent obtained.   Assessment: Review of patient past medical history, allergies, medications, health status, including review of consultants reports, laboratory and other test data, was performed as part of comprehensive evaluation and provision of chronic care management services.   SDOH (Social Determinants of Health) assessments and interventions performed:    Care Plan  Allergies  Allergen Reactions  . Penicillins Rash    Has patient had a PCN reaction causing immediate rash, facial/tongue/throat swelling, SOB or lightheadedness with hypotension: unknown Has patient had a PCN reaction causing severe rash involving mucus membranes or skin necrosis: unknown Has patient had a PCN reaction that required hospitalization: unknown Has patient had a PCN reaction occurring within the last 10 years: no If all of the above answers are "NO", then may proceed with Cephalosporin use.     Outpatient Encounter Medications as of  03/12/2020  Medication Sig  . acetaminophen (TYLENOL) 650 MG CR tablet Take 650-1,950 mg by mouth every 8 (eight) hours as needed for pain.  [name] aspirin 81 MG tablet Take 81 mg by mouth daily.  [name] atorvastatin (LIPITOR) 80 MG tablet Take 1 tablet (80 mg total) by mouth at bedtime.  . benazepril (LOTENSIN) 40 MG tablet Take 1 tablet (40 mg total) by mouth daily.  . calcium carbonate (TUMS EX) 750 MG chewable tablet Chew 2 tablets by mouth daily as needed for heartburn.   . cetirizine (ZYRTEC) 10 MG tablet Take 10 mg by mouth daily as needed for allergies.   . clindamycin (CLEOCIN) 150 MG capsule SMARTSIG:4 Capsule(s) By Mouth  . clindamycin (CLEOCIN) 300 MG capsule Take 300 mg by mouth 3 (three) times daily.  . Coenzyme Q-10 100 MG capsule Take 100 mg by mouth daily.  . CONTOUR NEXT TEST test strip USE AS DIRECTED  . empagliflozin (JARDIANCE) 25 MG TABS tablet Take 1 tablet (25 mg total) by mouth daily.  [name] etodolac (LODINE) 400 MG tablet Take by mouth.  . Garlic 1245 MG CAPS Take 1,000 mg by mouth daily.  . metFORMIN (GLUCOPHAGE) 500 MG tablet Take 2 tablets (1,000 mg total) by mouth 2 (two) times daily with a meal.  . metoprolol tartrate (LOPRESSOR) 100 MG tablet Take 1 tablet (100 mg total) by mouth 2 (two) times daily.  . Miconazole Nitrate 2 % AERO Apply 1 application topically daily as needed.  [name] OZEMPIC, 1 MG/DOSE, 4 MG/3ML SOPN INJECT 1MG INTO THE SKIN ONCE A WEEK  . Potassium Citrate 15 MEQ (1620 MG) TBCR Take 15 mEq by mouth 2 (two) times daily. 2 tab  QAM, 1 tab QPM  . tamsulosin (FLOMAX) 0.4 MG CAPS capsule Take 1 capsule (0.4 mg total) by mouth daily.  [name] tolnaftate (TINACTIN) 1 % spray Apply 1 application topically as needed.  [name] VASCEPA 1 g capsule Take 2 g by mouth 2 (two) times daily.   No facility-administered encounter medications on file as of 03/12/2020.    Patient Active Problem List   Diagnosis Date Noted  . Type 2 diabetes mellitus with stage 3 chronic kidney disease,  without long-term current use of insulin ([place]) 03/06/2019  . Morbid obesity ([place]) 11/10/2017  . CKD (chronic kidney disease), stage II 03/16/2016  . Plantar fasciitis 04/17/2015  . Osteoarthritis of both feet 03/05/2015  . Nephrolithiasis 12/02/2014  . Hypertension 07/02/2014  . Hyperlipidemia 07/02/2014  . Obesity, diabetes, and hypertension syndrome ([place]) 07/02/2014  . Difficult airway for intubation 05/09/2014    Conditions to be addressed/monitored: HLD and DMII  Care Plan : RNCM: Diabetes Type 2 (Adult)  Updates made by Vanita Ingles since 03/12/2020 12:00 AM    Problem: RNCM: Glycemic Management (Diabetes, Type 2)   Priority: Medium    Long-Range Goal: RNCM: Glycemic Management Optimized   Priority: Medium  Note:   Objective:  Lab Results  Component Value Date   HGBA1C 6.6 02/28/2020 .   Lab Results  Component Value Date   CREATININE 0.86 02/28/2020   CREATININE 1.10 09/06/2019   CREATININE 1.06 04/03/2019 .   [name] No results found for: EGFR Current Barriers:  [name] Knowledge Deficits related to basic Diabetes pathophysiology and self care/management . Knowledge Deficits related to medications used for management of diabetes . Limited Social Support . Lacks social connections . Does not contact provider office for questions/concerns Case Manager Clinical Goal(s):  . patient will demonstrate improved adherence to prescribed treatment plan for diabetes self care/management as evidenced by: daily monitoring and recording of CBG  adherence to ADA/ carb modified diet adherence to prescribed medication regimen contacting provider for new or worsened symptoms or questions Interventions:  . Collaboration with [name], DO regarding development and update of comprehensive plan of care as evidenced by provider attestation and co-signature . Inter-disciplinary care team collaboration (see longitudinal plan of care) . Provided education to patient about basic DM disease  process . Reviewed medications with patient and discussed importance of medication adherence . Discussed plans with patient for ongoing care management follow up and provided patient with direct contact information for care management team . Provided patient with written educational materials related to hypo and hyperglycemia and importance of correct treatment . Reviewed scheduled/upcoming provider appointments including: 08-28-2020 . Advised patient, providing education and rationale, to check cbg as directed  and record, calling pcp for findings outside established parameters.   . Review of patient status, including review of consultants reports, relevant laboratory and other test results, and medications completed. Patient Goals: - barriers to adherence to treatment plan identified - blood glucose monitoring encouraged - blood glucose readings reviewed - resources required to improve adherence to care identified - self-awareness of signs/symptoms of hypo or hyperglycemia encouraged - use of blood glucose monitoring log promoted Self-Care Activities - Self administers oral medications as prescribed Self administers insulin as prescribed Attends all scheduled provider appointments Checks blood sugars as prescribed and utilize hyper and hypoglycemia protocol as needed Adheres to prescribed ADA/carb modified Follow Up Plan: Telephone follow up appointment with care management team member scheduled for: 05-14-2020 at 230 pm   Task: RNCM: Alleviate Barriers to Glycemic  Management   Note:   Care Management Activities:    - barriers to adherence to treatment plan identified - blood glucose monitoring encouraged - blood glucose readings reviewed - resources required to improve adherence to care identified - self-awareness of signs/symptoms of hypo or hyperglycemia encouraged - use of blood glucose monitoring log promoted       Care Plan : RNCM: HLD Management  Updates made by Vanita Ingles since 03/12/2020 12:00 AM    Problem: RNCM: HLD Management   Priority: Medium    Long-Range Goal: RNCM: HLD Management   Note:   Current Barriers:  . Poorly controlled hyperlipidemia, complicated by DM, smoker . Current antihyperlipidemic regimen: Lipitor 80 mg . Most recent lipid panel:     Component Value Date/Time   CHOL 98 (L) 02/28/2020 0945   CHOL 108 08/29/2018 1045   TRIG 173 (H) 02/28/2020 0945   TRIG 221 (H) 08/29/2018 1045   HDL 25 (L) 02/28/2020 0945   CHOLHDL 6.5 (H) 02/09/2017 1002   VLDL 44 (H) 08/29/2018 1045   LDLCALC 44 02/28/2020 0945 .   [name] ASCVD risk enhancing conditions: age 61,  DM, HTN, CKD, current smoker . Lacks social connections . Does not contact provider office for questions/concerns  RN Care Manager Clinical Goal(s):  [name] Over the next 120 days, patient will work with Consulting civil engineer, providers, and care team towards execution of optimized self-health management plan . patient will verbalize understanding of plan for effective management of HLD . patient will work with [facility] and pcp to address needs related to HLD . patient will attend all scheduled medical appointments: 08-28-2020  Interventions: . Collaboration with [name], DO regarding development and update of comprehensive plan of care as evidenced by provider attestation and co-signature . Inter-disciplinary care team collaboration (see longitudinal plan of care) . Medication review performed; medication list updated in electronic medical record.  Bertram Savin care team collaboration (see longitudinal plan of care) . Referred to pharmacy team for assistance with HLD medication management . Evaluation of current treatment plan related to HLD and patient's adherence to plan as established by provider. . Advised patient to call the office for changes or questions  . Provided education to patient re: heart healthy diet, medication compliance . Reviewed scheduled/upcoming  provider appointments including: 08-28-2020  . Discussed plans with patient for ongoing care management follow up and provided patient with direct contact information for care management team   Patient Goals/Self-Care Activities: . Over the next 120 days, patient will:   - call for medicine refill 2 or 3 days before it runs out - call if I am sick and can't take my medicine - keep a list of all the medicines I take; vitamins and herbals too - learn to read medicine labels - use a pillbox to sort medicine - use an alarm clock or phone to remind me to take my medicine - change to whole grain breads, cereal, pasta - drink 6 to 8 glasses of water each day - eat 3 to 5 servings of fruits and vegetables each day - eat 5 or 6 small meals each day - fill half the plate with nonstarchy vegetables - join a weight loss program - limit fast food meals to no more than 1 per week - manage portion size - prepare main meal at home 3 to 5 days each week - read food labels for fat, fiber, carbohydrates and portion size - be open to making changes -  I can manage, know and watch for signs of a heart attack - if I have chest pain, call for help - learn about small changes that will make a big difference - learn my personal risk factors - barriers to meeting goals identified - change-talk evoked - choices provided - collaboration with team encouraged - decision-making supported - difficulty of making life-long changes acknowledged - health risks reviewed - problem-solving facilitated - questions answered - readiness for change evaluated - reassurance provided - self-reflection promoted - self-reliance encouraged  Follow Up Plan: Telephone follow up appointment with care management team member scheduled for: 05-14-2020 at 230 pm     Task: RNCM:HLD   Note:   Care Management Activities:    - barriers to meeting goals identified - change-talk evoked - choices provided - collaboration with team  encouraged - decision-making supported - difficulty of making life-long changes acknowledged - health risks reviewed - problem-solving facilitated - questions answered - readiness for change evaluated - reassurance provided - self-reflection promoted - self-reliance encouraged         Plan: Telephone follow up appointment with care management team member scheduled for:  05-14-2020 at 230 pm  [place]by [place], MSN, [facility] Mobile: [phone]

## 2020-03-12 NOTE — Patient Instructions (Signed)
Visit Information  Goals Addressed              This Visit's Progress   .  COMPLETED: RNCM: "I don't take my blood pressure at home" (pt-stated)        CARE PLAN ENTRY (see longtitudinal plan of care for additional care plan information)  Objective: Closing this goal and opening in new ELS . Last practice recorded BP readings:  . BP Readings from Last 3 Encounters: .  12/07/19 . 104/66 .  09/06/19 . 133/78 .  06/05/19 . 123/82 .      [name] Most recent eGFR/CrCl: No results found for: EGFR  No components found for: CRCL  Current Barriers:  [name] Knowledge Deficits related to basic understanding of hypertension pathophysiology and self care management . Literacy barriers . Limited Social Support . Film/video editor.   Case Manager Clinical Goal(s):  [name] Over the next 120 days, patient will verbalize understanding of plan for hypertension and hyperlipidemia management . Over the next 120 days, patient will attend all scheduled medical appointments: 12-07-2019 with pcp, 11-27-2019 with cardiolgoist  . Over the next 120 days, patient will demonstrate improved adherence to prescribed treatment plan for hypertension as evidenced by taking all medications as prescribed, monitoring and recording blood pressure as directed, adhering to low sodium/DASH diet . Over the next 120 days, patient will demonstrate improved health management independence as evidenced by checking blood pressure as directed and notifying PCP if SBP>160 or DBP > 90, taking all medications as prescribe, and adhering to a low sodium diet as discussed. . Over the next 120 days, patient will verbalize basic understanding of hypertension disease process and self health management plan as evidenced by positive lifestyle changes and working with CCM team to achieve health and wellness goals.   Interventions:  . Evaluation of current treatment plan related to hypertension self management and patient's adherence to plan as established  by provider.01-09-2020: the patient states that he is doing good and trying to watch what he eats. The patient denies any issues related to his HTN or HLD.  [name] Provided education to patient re: stroke prevention, s/s of heart attack and stroke, DASH diet, complications of uncontrolled blood pressure.  01-09-2020: Reminded the patient of watching for hidden sodium in foods especially with the holidays. The patient is excited about Christmas and spending time with his mom.  . Reviewed medications with patient and discussed importance of compliance.  The patient did a complete review with the pharm D in co visit today when in the office.  09-12-2019 Provided information for the new CCM team pharmacist Almyra Free. The patient has his Ozempic and is taking as prescribed. Will call the CCM team for education and support. 01-09-2020: the patient states that he is compliant with his medications regimen and has not new concerns related to medications at this time. States that he has been taken off of the hydrochlorothiazide and is doing well with this change.  . Discussed plans with patient for ongoing care management follow up and provided patient with direct contact information for care management team . Reviewed scheduled/upcoming provider appointments including: 02-28-2020 at 0940 am with pcp  Patient Self Care Activities:  . UNABLE to independently manage HTN and HLD . Follows a low sodium diet/DASH diet  Please see past updates related to this goal by clicking on the "Past Updates" button in the selected goal       .  COMPLETED: RNCM: Pt-"I don't know how to use my  meter" (pt-stated)        CARE PLAN ENTRY (see longtitudinal plan of care for additional care plan information)  Objective:  . Lab Results .  Component . Value . Date .   [name] HGBA1C . 6.4 . 12/07/2019 .     [name] Lab Results .  Component . Value . Date .   [name] CREATININE . 1.10 . 09/06/2019 .   [name] BUN . 19 . 09/06/2019 .   . NA . 138 . 09/06/2019  .   . K . 4.4 . 09/06/2019 .   . CL . 97 . 09/06/2019 .   [name] CO2 . 26 . 09/06/2019 .     [name] No results found for: EGFR  Current Barriers: Closing this goal and opening new ELS . Knowledge Deficits related to basic Diabetes pathophysiology and self care/management as evidence of elevated hemoglobin A1C of 8.6- decrease of A1C to 7.6 in the office today: 06-05-2019. Further decreased to 7.1 in the office on 09-06-2019. Most recent hemoglobin A1C is 6.4 on 12-07-2019.  [name] Knowledge Deficits related to medications used for management of diabetes . Film/video editor . Literacy barriers . Knowledge Deficits related to self administration of injectable diabetes medications (ozempic dose was not increased when he was supposed to- is increasing to 0.5 today 05/15/2019) . Does not use cbg meter- knowledge deficit related to how to use glucose meter. The patient has 2 glucose meters but is unsure how to use them. The patient is bringing the meter into the office May 18th- will have hands on teaching. This has been completed on today's visit. Completed and using consistently now.  . Limited Social Support  Case Manager Clinical Goal(s):  Over the next 120 days, patient will demonstrate improved adherence to prescribed treatment plan for diabetes self care/management as evidenced by:  . daily monitoring and recording of CBG.  .  . adherence to ADA/ carb modified diet . exercise 5 days/week . adherence to prescribed medication regimen  Interventions:  . Provided education to patient about basic DM disease process . Reviewed medications with patient and discussed importance of medication adherence.  The patient has increased his Ozempic dose to 0.75m and will see Dr. JWynetta Emerytoday for possibly new dose. 09-12-2019: The patient had called about a 90 day supply of Ozempic and left messages. Today the patient verbalized he had his medications and he thinks that Dr. JWynetta Emerygot the refill fixed to 90 days.  Education and support given. 01-09-2020: The patient has a 2 month supply of Ozempic. The patient wanted to know if he had to do new paperwork to get Ozempic for $25.00.  Education provided and will have the CCM pharmacist contact the patient in the new year for help with questions related to [place] and the patient does have a refill.  The patient endorses taking Ozempic as directed.  . Education on what hyperglycemia and hypoglycemia signs and symptoms are. 01-09-2020: the patient states that his sugars have been up and down and he has had some lows. Review of lows and the patient states it was 100's.  The patient denied any weakness, sweating, changes in mental status. Education on normal blood sugar ranges.  The patient states depending on what he eats is what his blood sugars are. Blood sugar this am was 137.  .Marland KitchenPraised the patient for getting hemoglobin A1C down to 7.6.  09-12-2019: In the office on 09-06-2019 his hemoglobin A1C was 7.1- praised the patient for  positive changes in his health. 11-14-2019: The patient will likely have new A1C drawn at the next MD visit. Review of goal of A1C <7.0. 01-09-2020: The patients most recent hemoglobin A1C was 6.4 on 12-07-2019. Praised the patient for doing well with managing his blood sugars and keeping A1C under <7.0. . Discussed plans with patient for ongoing care management follow up and provided patient with direct contact information for care management team . Reviewed scheduled/upcoming provider appointments including: 02-28-2020 with the pcp  . Advised patient, providing education and rationale, to check cbg BID and record, calling the pcp for blood sugars out of range. The patient feels he is doing the best he can. He says it is really hard to keep up with everything he is doing. Encouraged the patient to keep moving forward in his health and well being.  [name] Extensive education on ADA/carb modified diet. The patient likes to drink sweet tea.  Discussed using sugar substitutes.  The patient verbalized he does like to drink water. Talked about flavored waters like "clear American", diet lipton teas, diet drinks- the patient does not like diet drinks but is open to trying the diet lipton tea options. Encouraged the patient to explore sugar free beverages to help with controlling his diabetes. The patient states it is hard to eat right all the time. Will provide written educational material for the patient and send via mail. The patient endorses drinking a lot of water and likes the diet lipton tea.    Patient Self Care Activities:  . UNABLE to independently manage diabetes  . Self administers injectable DM medication (Ozempic) as prescribed . Checks blood sugars as prescribed and utilize hyper and hypoglycemia protocol as needed . Adheres to prescribed ADA/carb modified  Please see past updates related to this goal by clicking on the "Past Updates" button in the selected goal         The patient verbalized understanding of instructions, educational materials, and care plan provided today and declined offer to receive copy of patient instructions, educational materials, and care plan.   Telephone follow up appointment with care management team member scheduled for: 05-14-2020 at 230 pm  Noreene Larsson RN, MSN, [facility] Mobile: [phone]

## 2020-04-03 ENCOUNTER — Telehealth: Payer: Self-pay

## 2020-04-03 ENCOUNTER — Other Ambulatory Visit: Payer: Self-pay | Admitting: Family Medicine

## 2020-04-03 DIAGNOSIS — I10 Essential (primary) hypertension: Secondary | ICD-10-CM

## 2020-04-03 DIAGNOSIS — E1122 Type 2 diabetes mellitus with diabetic chronic kidney disease: Secondary | ICD-10-CM

## 2020-04-03 DIAGNOSIS — N183 Chronic kidney disease, stage 3 unspecified: Secondary | ICD-10-CM

## 2020-04-03 NOTE — Telephone Encounter (Signed)
Is the patient asking about a specific medication or all of them?

## 2020-04-03 NOTE — Telephone Encounter (Signed)
Pt called in stating that the pharmacy states that he needed to see the provider before they could refill. Per refill looks like it's requested too soon but he should have the refills per Dr Laural Benes filling them in [place] with refills. Please look into this for the pt.

## 2020-04-03 NOTE — Telephone Encounter (Signed)
Called and spoke to pharmacy. They have both prescriptions on file and can file on 04/06/20 based on insurance.   Called and notified patient that he would be able to fill these on the 04/06/20

## 2020-04-04 ENCOUNTER — Other Ambulatory Visit: Payer: Self-pay | Admitting: Family Medicine

## 2020-04-11 ENCOUNTER — Other Ambulatory Visit: Payer: Self-pay | Admitting: Family Medicine

## 2020-04-11 DIAGNOSIS — I10 Essential (primary) hypertension: Secondary | ICD-10-CM

## 2020-05-14 ENCOUNTER — Ambulatory Visit: Payer: Self-pay | Admitting: General Practice

## 2020-05-14 ENCOUNTER — Telehealth: Payer: Self-pay | Admitting: General Practice

## 2020-05-14 DIAGNOSIS — E782 Mixed hyperlipidemia: Secondary | ICD-10-CM

## 2020-05-14 DIAGNOSIS — N183 Chronic kidney disease, stage 3 unspecified: Secondary | ICD-10-CM

## 2020-05-14 DIAGNOSIS — E1122 Type 2 diabetes mellitus with diabetic chronic kidney disease: Secondary | ICD-10-CM

## 2020-05-14 NOTE — Patient Instructions (Signed)
Visit Information  Patient Care Plan: RNCM: Diabetes Type 2 (Adult)    Problem Identified: RNCM: Glycemic Management (Diabetes, Type 2)   Priority: Medium    Long-Range Goal: RNCM: Glycemic Management Optimized   Priority: Medium  Note:   Objective:  Lab Results  Component Value Date   HGBA1C 6.6 02/28/2020 .   Lab Results  Component Value Date   CREATININE 0.86 02/28/2020   CREATININE 1.10 09/06/2019   CREATININE 1.06 04/03/2019 .   [name] No results found for: EGFR Current Barriers:  [name] Knowledge Deficits related to basic Diabetes pathophysiology and self care/management . Knowledge Deficits related to medications used for management of diabetes . Limited Social Support . Lacks social connections . Does not contact provider office for questions/concerns Case Manager Clinical Goal(s):  . patient will demonstrate improved adherence to prescribed treatment plan for diabetes self care/management as evidenced by: daily monitoring and recording of CBG  adherence to ADA/ carb modified diet adherence to prescribed medication regimen contacting provider for new or worsened symptoms or questions Interventions:  . Collaboration with [name], DO regarding development and update of comprehensive plan of care as evidenced by provider attestation and co-signature . Inter-disciplinary care team collaboration (see longitudinal plan of care) . Provided education to patient about basic DM disease process. 05-14-2020: Review of the patients DM management and the patient states that he is doing well. He states his blood sugars are around 113 to 130.  The patient states he has not experienced any lows at this time.  . Reviewed medications with patient and discussed importance of medication adherence. 05-14-2020: The patient is compliant with medications  . Discussed plans with patient for ongoing care management follow up and provided patient with direct contact information for care management  team . Provided patient with written educational materials related to hypo and hyperglycemia and importance of correct treatment . Reviewed scheduled/upcoming provider appointments including: 08-28-2020 . Advised patient, providing education and rationale, to check cbg as directed  and record, calling pcp for findings outside established parameters.  05-14-2020: Education and support given on checking blood sugars daily and when feeling different. The patient denies any issues with his DM.  [name] Review of patient status, including review of consultants reports, relevant laboratory and other test results, and medications completed. Patient Goals: - barriers to adherence to treatment plan identified - blood glucose monitoring encouraged - blood glucose readings reviewed - resources required to improve adherence to care identified - self-awareness of signs/symptoms of hypo or hyperglycemia encouraged - use of blood glucose monitoring log promoted Self-Care Activities - Self administers oral medications as prescribed Self administers insulin as prescribed Attends all scheduled provider appointments Checks blood sugars as prescribed and utilize hyper and hypoglycemia protocol as needed Adheres to prescribed ADA/carb modified Follow Up Plan: Telephone follow up appointment with care management team member scheduled for: 07-18-2020 at 230 pm   Task: RNCM: Alleviate Barriers to Glycemic Management   Note:   Care Management Activities:    - barriers to adherence to treatment plan identified - blood glucose monitoring encouraged - blood glucose readings reviewed - resources required to improve adherence to care identified - self-awareness of signs/symptoms of hypo or hyperglycemia encouraged - use of blood glucose monitoring log promoted       Patient Care Plan: RNCM: HLD Management    Problem Identified: RNCM: HLD Management   Priority: Medium    Long-Range Goal: RNCM: HLD Management    Priority: Medium  Note:  Current Barriers:  . Poorly controlled hyperlipidemia, complicated by DM, smoker . Current antihyperlipidemic regimen: Lipitor 80 mg . Most recent lipid panel:     Component Value Date/Time   CHOL 98 (L) 02/28/2020 0945   CHOL 108 08/29/2018 1045   TRIG 173 (H) 02/28/2020 0945   TRIG 221 (H) 08/29/2018 1045   HDL 25 (L) 02/28/2020 0945   CHOLHDL 6.5 (H) 02/09/2017 1002   VLDL 44 (H) 08/29/2018 1045   LDLCALC 44 02/28/2020 0945 .   [name] ASCVD risk enhancing conditions: age 55,  DM, HTN, CKD, current smoker . Lacks social connections . Does not contact provider office for questions/concerns  RN Care Manager Clinical Goal(s):  [name] Over the next 120 days, patient will work with Consulting civil engineer, providers, and care team towards execution of optimized self-health management plan . patient will verbalize understanding of plan for effective management of HLD . patient will work with [facility] and pcp to address needs related to HLD . patient will attend all scheduled medical appointments: 08-28-2020  Interventions: . Collaboration with [name], DO regarding development and update of comprehensive plan of care as evidenced by provider attestation and co-signature . Inter-disciplinary care team collaboration (see longitudinal plan of care) . Medication review performed; medication list updated in electronic medical record.  Bertram Savin care team collaboration (see longitudinal plan of care) . Referred to pharmacy team for assistance with HLD medication management . Evaluation of current treatment plan related to HLD and patient's adherence to plan as established by provider. 05-14-2020: The patient states that he is eating well, does not always follow a heart healthy/ADA diet.  . Advised patient to call the office for changes or questions  . Provided education to patient re: heart healthy diet, medication compliance. 05-14-2020: The patient states he is doing  well and denies any acute distress. Review of heart healthy/ADA diet and adequate rest.  . Reviewed scheduled/upcoming provider appointments including: 08-28-2020, goes to [facility] on 06-03-2020 for evaluation of his kidney stones.  . Discussed plans with patient for ongoing care management follow up and provided patient with direct contact information for care management team   Patient Goals/Self-Care Activities: . Over the next 120 days, patient will:   - call for medicine refill 2 or 3 days before it runs out - call if I am sick and can't take my medicine - keep a list of all the medicines I take; vitamins and herbals too - learn to read medicine labels - use a pillbox to sort medicine - use an alarm clock or phone to remind me to take my medicine - change to whole grain breads, cereal, pasta - drink 6 to 8 glasses of water each day - eat 3 to 5 servings of fruits and vegetables each day - eat 5 or 6 small meals each day - fill half the plate with nonstarchy vegetables - join a weight loss program - limit fast food meals to no more than 1 per week - manage portion size - prepare main meal at home 3 to 5 days each week - read food labels for fat, fiber, carbohydrates and portion size - be open to making changes - I can manage, know and watch for signs of a heart attack - if I have chest pain, call for help - learn about small changes that will make a big difference - learn my personal risk factors - barriers to meeting goals identified - change-talk evoked - choices provided -  collaboration with team encouraged - decision-making supported - difficulty of making life-long changes acknowledged - health risks reviewed - problem-solving facilitated - questions answered - readiness for change evaluated - reassurance provided - self-reflection promoted - self-reliance encouraged  Follow Up Plan: Telephone follow up appointment with care management team member scheduled for:  07-18-2020 at 230 pm     Task: RNCM:HLD   Note:   Care Management Activities:    - barriers to meeting goals identified - change-talk evoked - choices provided - collaboration with team encouraged - decision-making supported - difficulty of making life-long changes acknowledged - health risks reviewed - problem-solving facilitated - questions answered - readiness for change evaluated - reassurance provided - self-reflection promoted - self-reliance encouraged         The patient verbalized understanding of instructions, educational materials, and care plan provided today and declined offer to receive copy of patient instructions, educational materials, and care plan.   Telephone follow up appointment with care management team member scheduled for: 07-18-2020 at 230 pm  Noreene Larsson RN, MSN, [facility] Mobile: [phone]

## 2020-05-14 NOTE — Chronic Care Management (AMB) (Signed)
Care Management    RN Visit Note  05/14/2020 Name: Joshua Lee MRN: [id number] DOB: 30-Sep-1965  Subjective: Joshua Lee is a 55 y.o. year old male who is a primary care patient of [name], DO. The care management team was consulted for assistance with disease management and care coordination needs.    Engaged with patient by telephone for follow up visit in response to provider referral for case management and/or care coordination services.   Consent to Services:   Mr. Riddles was given information about Care Management services today including:  1. Care Management services includes personalized support from designated clinical staff supervised by his physician, including individualized plan of care and coordination with other care providers 2. 24/7 contact phone numbers for assistance for urgent and routine care needs. 3. The patient may stop case management services at any time by phone call to the office staff.  Patient agreed to services and consent obtained.   Assessment: Review of patient past medical history, allergies, medications, health status, including review of consultants reports, laboratory and other test data, was performed as part of comprehensive evaluation and provision of chronic care management services.   SDOH (Social Determinants of Health) assessments and interventions performed:    Care Plan  Allergies  Allergen Reactions  . Penicillins Rash    Has patient had a PCN reaction causing immediate rash, facial/tongue/throat swelling, SOB or lightheadedness with hypotension: unknown Has patient had a PCN reaction causing severe rash involving mucus membranes or skin necrosis: unknown Has patient had a PCN reaction that required hospitalization: unknown Has patient had a PCN reaction occurring within the last 10 years: no If all of the above answers are "NO", then may proceed with Cephalosporin use.     Outpatient Encounter Medications as of  05/14/2020  Medication Sig  . acetaminophen (TYLENOL) 650 MG CR tablet Take 650-1,950 mg by mouth every 8 (eight) hours as needed for pain.  [name] aspirin 81 MG tablet Take 81 mg by mouth daily.  [name] atorvastatin (LIPITOR) 80 MG tablet Take 1 tablet (80 mg total) by mouth at bedtime.  . benazepril (LOTENSIN) 40 MG tablet Take 1 tablet (40 mg total) by mouth daily.  . calcium carbonate (TUMS EX) 750 MG chewable tablet Chew 2 tablets by mouth daily as needed for heartburn.   . cetirizine (ZYRTEC) 10 MG tablet Take 10 mg by mouth daily as needed for allergies.   . clindamycin (CLEOCIN) 150 MG capsule SMARTSIG:4 Capsule(s) By Mouth  . clindamycin (CLEOCIN) 300 MG capsule Take 300 mg by mouth 3 (three) times daily.  . Coenzyme Q-10 100 MG capsule Take 100 mg by mouth daily.  . CONTOUR NEXT TEST test strip USE AS DIRECTED  . etodolac (LODINE) 400 MG tablet Take by mouth.  . Garlic 9833 MG CAPS Take 1,000 mg by mouth daily.  [name] JARDIANCE 25 MG TABS tablet TAKE 1 TABLET(25 MG) BY MOUTH DAILY  . metFORMIN (GLUCOPHAGE) 500 MG tablet Take 2 tablets (1,000 mg total) by mouth 2 (two) times daily with a meal.  . metoprolol tartrate (LOPRESSOR) 100 MG tablet Take 1 tablet (100 mg total) by mouth 2 (two) times daily.  . Miconazole Nitrate 2 % AERO Apply 1 application topically daily as needed.  [name] OZEMPIC, 1 MG/DOSE, 4 MG/3ML SOPN INJECT 1MG INTO THE SKIN ONCE A WEEK  . Potassium Citrate 15 MEQ (1620 MG) TBCR Take 15 mEq by mouth 2 (two) times daily. 2 tab QAM, 1 tab  QPM  . tamsulosin (FLOMAX) 0.4 MG CAPS capsule TAKE 1 CAPSULE(0.4 MG) BY MOUTH DAILY  . tolnaftate (TINACTIN) 1 % spray Apply 1 application topically as needed.  [name] VASCEPA 1 g capsule Take 2 g by mouth 2 (two) times daily.   No facility-administered encounter medications on file as of 05/14/2020.    Patient Active Problem List   Diagnosis Date Noted  . Type 2 diabetes mellitus with stage 3 chronic kidney disease, without long-term current use of  insulin ([place]) 03/06/2019  . Morbid obesity ([place]) 11/10/2017  . CKD (chronic kidney disease), stage II 03/16/2016  . Plantar fasciitis 04/17/2015  . Osteoarthritis of both feet 03/05/2015  . Nephrolithiasis 12/02/2014  . Hypertension 07/02/2014  . Hyperlipidemia 07/02/2014  . Obesity, diabetes, and hypertension syndrome ([place]) 07/02/2014  . Difficult airway for intubation 05/09/2014    Conditions to be addressed/monitored: HLD and DMII  Care Plan : RNCM: Diabetes Type 2 (Adult)  Updates made by Vanita Ingles since 05/14/2020 12:00 AM    Problem: RNCM: Glycemic Management (Diabetes, Type 2)   Priority: Medium    Long-Range Goal: RNCM: Glycemic Management Optimized   Priority: Medium  Note:   Objective:  Lab Results  Component Value Date   HGBA1C 6.6 02/28/2020 .   Lab Results  Component Value Date   CREATININE 0.86 02/28/2020   CREATININE 1.10 09/06/2019   CREATININE 1.06 04/03/2019 .   [name] No results found for: EGFR Current Barriers:  [name] Knowledge Deficits related to basic Diabetes pathophysiology and self care/management . Knowledge Deficits related to medications used for management of diabetes . Limited Social Support . Lacks social connections . Does not contact provider office for questions/concerns Case Manager Clinical Goal(s):  . patient will demonstrate improved adherence to prescribed treatment plan for diabetes self care/management as evidenced by: daily monitoring and recording of CBG  adherence to ADA/ carb modified diet adherence to prescribed medication regimen contacting provider for new or worsened symptoms or questions Interventions:  . Collaboration with [name], DO regarding development and update of comprehensive plan of care as evidenced by provider attestation and co-signature . Inter-disciplinary care team collaboration (see longitudinal plan of care) . Provided education to patient about basic DM disease process. 05-14-2020: Review of the  patients DM management and the patient states that he is doing well. He states his blood sugars are around 113 to 130.  The patient states he has not experienced any lows at this time.  . Reviewed medications with patient and discussed importance of medication adherence. 05-14-2020: The patient is compliant with medications  . Discussed plans with patient for ongoing care management follow up and provided patient with direct contact information for care management team . Provided patient with written educational materials related to hypo and hyperglycemia and importance of correct treatment . Reviewed scheduled/upcoming provider appointments including: 08-28-2020 . Advised patient, providing education and rationale, to check cbg as directed  and record, calling pcp for findings outside established parameters.  05-14-2020: Education and support given on checking blood sugars daily and when feeling different. The patient denies any issues with his DM.  [name] Review of patient status, including review of consultants reports, relevant laboratory and other test results, and medications completed. Patient Goals: - barriers to adherence to treatment plan identified - blood glucose monitoring encouraged - blood glucose readings reviewed - resources required to improve adherence to care identified - self-awareness of signs/symptoms of hypo or hyperglycemia encouraged - use of blood glucose monitoring  log promoted Self-Care Activities - Self administers oral medications as prescribed Self administers insulin as prescribed Attends all scheduled provider appointments Checks blood sugars as prescribed and utilize hyper and hypoglycemia protocol as needed Adheres to prescribed ADA/carb modified Follow Up Plan: Telephone follow up appointment with care management team member scheduled for: 07-18-2020 at 230 pm   Care Plan : RNCM: HLD Management  Updates made by Vanita Ingles since 05/14/2020 12:00 AM    Problem:  RNCM: HLD Management   Priority: Medium    Long-Range Goal: RNCM: HLD Management   Priority: Medium  Note:   Current Barriers:  . Poorly controlled hyperlipidemia, complicated by DM, smoker . Current antihyperlipidemic regimen: Lipitor 80 mg . Most recent lipid panel:     Component Value Date/Time   CHOL 98 (L) 02/28/2020 0945   CHOL 108 08/29/2018 1045   TRIG 173 (H) 02/28/2020 0945   TRIG 221 (H) 08/29/2018 1045   HDL 25 (L) 02/28/2020 0945   CHOLHDL 6.5 (H) 02/09/2017 1002   VLDL 44 (H) 08/29/2018 1045   LDLCALC 44 02/28/2020 0945 .   [name] ASCVD risk enhancing conditions: age 41,  DM, HTN, CKD, current smoker . Lacks social connections . Does not contact provider office for questions/concerns  RN Care Manager Clinical Goal(s):  [name] Over the next 120 days, patient will work with Consulting civil engineer, providers, and care team towards execution of optimized self-health management plan . patient will verbalize understanding of plan for effective management of HLD . patient will work with [facility] and pcp to address needs related to HLD . patient will attend all scheduled medical appointments: 08-28-2020  Interventions: . Collaboration with [name], DO regarding development and update of comprehensive plan of care as evidenced by provider attestation and co-signature . Inter-disciplinary care team collaboration (see longitudinal plan of care) . Medication review performed; medication list updated in electronic medical record.  Bertram Savin care team collaboration (see longitudinal plan of care) . Referred to pharmacy team for assistance with HLD medication management . Evaluation of current treatment plan related to HLD and patient's adherence to plan as established by provider. 05-14-2020: The patient states that he is eating well, does not always follow a heart healthy/ADA diet.  . Advised patient to call the office for changes or questions  . Provided education to patient re:  heart healthy diet, medication compliance. 05-14-2020: The patient states he is doing well and denies any acute distress. Review of heart healthy/ADA diet and adequate rest.  . Reviewed scheduled/upcoming provider appointments including: 08-28-2020, goes to [facility] on 06-03-2020 for evaluation of his kidney stones.  . Discussed plans with patient for ongoing care management follow up and provided patient with direct contact information for care management team   Patient Goals/Self-Care Activities: . Over the next 120 days, patient will:   - call for medicine refill 2 or 3 days before it runs out - call if I am sick and can't take my medicine - keep a list of all the medicines I take; vitamins and herbals too - learn to read medicine labels - use a pillbox to sort medicine - use an alarm clock or phone to remind me to take my medicine - change to whole grain breads, cereal, pasta - drink 6 to 8 glasses of water each day - eat 3 to 5 servings of fruits and vegetables each day - eat 5 or 6 small meals each day - fill half the plate with nonstarchy  vegetables - join a weight loss program - limit fast food meals to no more than 1 per week - manage portion size - prepare main meal at home 3 to 5 days each week - read food labels for fat, fiber, carbohydrates and portion size - be open to making changes - I can manage, know and watch for signs of a heart attack - if I have chest pain, call for help - learn about small changes that will make a big difference - learn my personal risk factors - barriers to meeting goals identified - change-talk evoked - choices provided - collaboration with team encouraged - decision-making supported - difficulty of making life-long changes acknowledged - health risks reviewed - problem-solving facilitated - questions answered - readiness for change evaluated - reassurance provided - self-reflection promoted - self-reliance encouraged  Follow Up  Plan: Telephone follow up appointment with care management team member scheduled for: 07-18-2020 at 230 pm       Plan: Telephone follow up appointment with care management team member scheduled for:  07-18-2020 at 230 pm  Noreene Larsson RN, MSN, [facility] Mobile: [phone]

## 2020-06-10 ENCOUNTER — Other Ambulatory Visit: Payer: Self-pay

## 2020-06-10 ENCOUNTER — Ambulatory Visit (INDEPENDENT_AMBULATORY_CARE_PROVIDER_SITE_OTHER): Payer: BLUE CROSS/BLUE SHIELD | Admitting: Internal Medicine

## 2020-06-10 ENCOUNTER — Encounter: Payer: Self-pay | Admitting: Internal Medicine

## 2020-06-10 VITALS — BP 104/65 | HR 84 | Temp 97.9°F | Wt 245.6 lb

## 2020-06-10 DIAGNOSIS — R109 Unspecified abdominal pain: Secondary | ICD-10-CM | POA: Diagnosis not present

## 2020-06-10 LAB — CBC WITH DIFFERENTIAL/PLATELET
Hematocrit: 43 % (ref 37.5–51.0)
Hemoglobin: 14.5 g/dL (ref 13.0–17.7)
Lymphocytes Absolute: 1 10*3/uL (ref 0.7–3.1)
Lymphs: 15 %
MCH: 28 pg (ref 26.6–33.0)
MCHC: 33.7 g/dL (ref 31.5–35.7)
MCV: 83 fL (ref 79–97)
MID (Absolute): 0.5 10*3/uL (ref 0.1–1.6)
MID: 8 %
Neutrophils Absolute: 5.3 10*3/uL (ref 1.4–7.0)
Neutrophils: 77 %
Platelets: 202 10*3/uL (ref 150–450)
RBC: 5.17 x10E6/uL (ref 4.14–5.80)
RDW: 16.2 % — ABNORMAL HIGH (ref 11.6–15.4)
WBC: 6.8 10*3/uL (ref 3.4–10.8)

## 2020-06-10 LAB — URINALYSIS, ROUTINE W REFLEX MICROSCOPIC
Bilirubin, UA: NEGATIVE
Ketones, UA: NEGATIVE
Leukocytes,UA: NEGATIVE
Nitrite, UA: NEGATIVE
Protein,UA: NEGATIVE
RBC, UA: NEGATIVE
Specific Gravity, UA: <1.005 — ABNORMAL LOW (ref 1.005–1.030)
Urobilinogen, Ur: 0.2 mg/dL (ref 0.2–1.0)
pH, UA: 5.5 (ref 5.0–7.5)

## 2020-06-10 MED ORDER — CULTURELLE PO CAPS: 1.0000 | ORAL_CAPSULE | Freq: Two times a day (BID) | ORAL | 0 refills | Status: AC

## 2020-06-10 NOTE — Progress Notes (Signed)
BP 104/65   Pulse 84   Temp 97.9 F (36.6 C) (Oral)   Wt 245 lb 9.6 oz (111.4 kg)   SpO2 99%   BMI 36.71 kg/m    Subjective:    Patient ID: Joshua Lee, male    DOB: 1965/11/14, 55 y.o.   MRN: [id number]  HPI: EMANUEL DOWSON is a 55 y.o. male  Abdominal Pain This is a new (has had pepto x 4 yesterday and noticed dark stools , no N, no V, no diarhea or constipation) problem. The current episode started yesterday (symptoms started sunday per pt). Onset quality: Took pepto tablets yesterday tiij 2 abd 2 more 30 mins later.  The problem has been gradually improving. The pain is at a severity of 0/10. The patient is experiencing no pain. Pertinent negatives include no flatus, headaches, melena, myalgias, nausea, vomiting or weight loss.    Chief Complaint  Patient presents with  . Abdominal Pain    Stomach pain since Sunday    Relevant past medical, surgical, family and social history reviewed and updated as indicated. Interim medical history since our last visit reviewed. Allergies and medications reviewed and updated.  Review of Systems  Constitutional: Negative for weight loss.  Gastrointestinal: Positive for abdominal pain. Negative for flatus, melena, nausea and vomiting.  Musculoskeletal: Negative for myalgias.  Neurological: Negative for headaches.    Per HPI unless specifically indicated above     Objective:    BP 104/65   Pulse 84   Temp 97.9 F (36.6 C) (Oral)   Wt 245 lb 9.6 oz (111.4 kg)   SpO2 99%   BMI 36.71 kg/m   Wt Readings from Last 3 Encounters:  06/10/20 245 lb 9.6 oz (111.4 kg)  02/28/20 250 lb 9.6 oz (113.7 kg)  12/07/19 244 lb 12.8 oz (111 kg)    Physical Exam Vitals and nursing note reviewed.  Constitutional:      General: He is not in acute distress.    Appearance: Normal appearance. He is not ill-appearing or diaphoretic.  HENT:     Head: Normocephalic.     Right Ear: Tympanic membrane and external ear normal. There is no  impacted cerumen.     Left Ear: External ear normal.     Nose: No congestion or rhinorrhea.     Mouth/Throat:     Pharynx: No oropharyngeal exudate or posterior oropharyngeal erythema.  Eyes:     Conjunctiva/sclera: Conjunctivae normal.  Abdominal:     General: Abdomen is flat. Bowel sounds are normal. There is no distension or abdominal bruit. There are no signs of injury.     Palpations: There is no mass.     Tenderness: There is no abdominal tenderness.  Musculoskeletal:        General: No swelling or deformity.     Cervical back: Normal range of motion and neck supple. No rigidity or tenderness.     Right lower leg: No edema.     Left lower leg: No edema.  Neurological:     Mental Status: He is alert. Mental status is at baseline.  Psychiatric:        Thought Content: Thought content normal.        Judgment: Judgment normal.     Results for orders placed or performed in visit on 02/28/20  Comprehensive metabolic panel  Result Value Ref Range   Glucose 202 (H) 65 - 99 mg/dL   BUN 13 6 - 24 mg/dL   Creatinine,  Ser 0.86 0.76 - 1.27 mg/dL   GFR calc non Af Amer 98 >59 mL/min/1.73   GFR calc Af Amer 114 >59 mL/min/1.73   BUN/Creatinine Ratio 15 9 - 20   Sodium 136 134 - 144 mmol/L   Potassium 4.3 3.5 - 5.2 mmol/L   Chloride 98 96 - 106 mmol/L   CO2 21 20 - 29 mmol/L   Calcium 9.1 8.7 - 10.2 mg/dL   Total Protein 6.9 6.0 - 8.5 g/dL   Albumin 4.3 3.8 - 4.9 g/dL   Globulin, Total 2.6 1.5 - 4.5 g/dL   Albumin/Globulin Ratio 1.7 1.2 - 2.2   Bilirubin Total 0.5 0.0 - 1.2 mg/dL   Alkaline Phosphatase 55 44 - 121 IU/L   AST 18 0 - 40 IU/L   ALT 20 0 - 44 IU/L  CBC with Differential/Platelet  Result Value Ref Range   WBC 7.7 3.4 - 10.8 x10E3/uL   RBC 5.40 4.14 - 5.80 x10E6/uL   Hemoglobin 14.1 13.0 - 17.7 g/dL   Hematocrit 45.6 25.6 - 51.0 %   MCV 82 79 - 97 fL   MCH 26.1 (L) 26.6 - 33.0 pg   MCHC 31.8 31.5 - 35.7 g/dL   RDW 38.9 37.3 - 42.8 %   Platelets 212 150 - 450  x10E3/uL   Neutrophils 74 Not Estab. %   Lymphs 18 Not Estab. %   Monocytes 7 Not Estab. %   Eos 1 Not Estab. %   Basos 0 Not Estab. %   Neutrophils Absolute 5.6 1.4 - 7.0 x10E3/uL   Lymphocytes Absolute 1.4 0.7 - 3.1 x10E3/uL   Monocytes Absolute 0.6 0.1 - 0.9 x10E3/uL   EOS (ABSOLUTE) 0.1 0.0 - 0.4 x10E3/uL   Basophils Absolute 0.0 0.0 - 0.2 x10E3/uL   Immature Granulocytes 0 Not Estab. %   Immature Grans (Abs) 0.0 0.0 - 0.1 x10E3/uL  Lipid Panel w/o Chol/HDL Ratio  Result Value Ref Range   Cholesterol, Total 98 (L) 100 - 199 mg/dL   Triglycerides 768 (H) 0 - 149 mg/dL   HDL 25 (L) >11 mg/dL   VLDL Cholesterol Cal 29 5 - 40 mg/dL   LDL Chol Calc (NIH) 44 0 - 99 mg/dL  Bayer DCA Hb X7W Waived  Result Value Ref Range   HB A1C (BAYER DCA - WAIVED) 6.6 <7.0 %           Assessment & Plan:  1. Stomach pain / rumbling :  To take otc probiotics.  Pt to stop taking pepto as this is causing black stools and worsneing his symptoms. Check labs today including UA, CBC, CMP   Problem List Items Addressed This Visit   None   Visit Diagnoses    Abdominal pain, unspecified abdominal location    -  Primary   Relevant Orders   CBC With Differential/Platelet   Urinalysis, Routine w reflex microscopic   Comprehensive metabolic panel       Follow up plan: No follow-ups on file.

## 2020-06-10 NOTE — Addendum Note (Signed)
Addended by: Leward Quan A on: 06/10/2020 01:56 PM   Modules accepted: Orders

## 2020-06-11 LAB — COMPREHENSIVE METABOLIC PANEL
ALT: 25 IU/L (ref 0–44)
AST: 24 IU/L (ref 0–40)
Albumin/Globulin Ratio: 1.6 (ref 1.2–2.2)
Albumin: 4.1 g/dL (ref 3.8–4.9)
Alkaline Phosphatase: 47 IU/L (ref 44–121)
BUN/Creatinine Ratio: 13 (ref 9–20)
BUN: 12 mg/dL (ref 6–24)
Bilirubin Total: 0.4 mg/dL (ref 0.0–1.2)
CO2: 22 mmol/L (ref 20–29)
Calcium: 8.7 mg/dL (ref 8.7–10.2)
Chloride: 99 mmol/L (ref 96–106)
Creatinine, Ser: 0.96 mg/dL (ref 0.76–1.27)
Globulin, Total: 2.5 g/dL (ref 1.5–4.5)
Glucose: 173 mg/dL — ABNORMAL HIGH (ref 65–99)
Potassium: 4 mmol/L (ref 3.5–5.2)
Sodium: 137 mmol/L (ref 134–144)
Total Protein: 6.6 g/dL (ref 6.0–8.5)
eGFR: 94 mL/min/{1.73_m2} (ref >59)

## 2020-06-13 ENCOUNTER — Telehealth: Payer: Self-pay

## 2020-06-13 NOTE — Telephone Encounter (Signed)
Pt called in to inquire about lab results. States that he hasn't heard anything yet.

## 2020-06-13 NOTE — Telephone Encounter (Signed)
Pt is calling back for his lab results. Please advise CB- [phone]

## 2020-06-13 NOTE — Telephone Encounter (Signed)
Looks like you saw him °

## 2020-06-17 NOTE — Telephone Encounter (Signed)
Patient was notified that recent lab results were normal and patient verbalized understanding. Advised to give our office a call back if he has any questions or concerns.

## 2020-06-17 NOTE — Telephone Encounter (Signed)
Please let him know that his labs came back normal. Thanks!

## 2020-07-09 LAB — HM DIABETES EYE EXAM

## 2020-07-18 ENCOUNTER — Telehealth: Payer: Self-pay | Admitting: General Practice

## 2020-07-18 ENCOUNTER — Ambulatory Visit: Payer: Self-pay | Admitting: General Practice

## 2020-07-18 DIAGNOSIS — E1122 Type 2 diabetes mellitus with diabetic chronic kidney disease: Secondary | ICD-10-CM

## 2020-07-18 DIAGNOSIS — E782 Mixed hyperlipidemia: Secondary | ICD-10-CM

## 2020-07-18 DIAGNOSIS — N183 Chronic kidney disease, stage 3 unspecified: Secondary | ICD-10-CM

## 2020-07-18 NOTE — Patient Instructions (Signed)
Visit Information   Goals Addressed             This Visit's Progress    RNCM: Monitor and Manage My Blood Sugar-Diabetes Type 2       Timeframe:  Short-Term Goal Priority:  High Start Date:     07-18-2021                        Expected End Date:       02-17-2020                Follow Up Date 09/26/2020    - check blood sugar at prescribed times - check blood sugar before and after exercise - check blood sugar if I feel it is too high or too low - enter blood sugar readings and medication or insulin into daily log - take the blood sugar log to all doctor visits - take the blood sugar meter to all doctor visits    Why is this important?   Checking your blood sugar at home helps to keep it from getting very high or very low.  Writing the results in a diary or log helps the doctor know how to care for you.  Your blood sugar log should have the time, date and the results.  Also, write down the amount of insulin or other medicine that you take.  Other information, like what you ate, exercise done and how you were feeling, will also be helpful.     Notes: 07-18-2020: The last time the patient took his blood sugar at home was 06-14-2020 with a reading of 99.  The patient states he has gotten slack. Education on the need to check blood sugars on a consistent bases and record. The patient denies any lows and endorses that he will start back checking his blood sugars. Discussed fasting of <130 and post prandial of <180.       RNCM: Set My Target A1C-Diabetes Type 2       Timeframe:  Short-Term Goal Priority:  High Start Date:            07-18-2020                 Expected End Date:        01-16-2021               Follow Up Date 09/26/2020    - set target A1C    Why is this important?   Your target A1C is decided together by you and your doctor.  It is based on several things like your age and other health issues.    Notes: Last hemoglobin A1C was 6.6. Discussed the goal of keeping A1C of  less than 7.0         The patient verbalized understanding of instructions, educational materials, and care plan provided today and declined offer to receive copy of patient instructions, educational materials, and care plan.   Telephone follow up appointment with care management team member scheduled for:09-26-2020 at 1 pm  Alto Denver RN, MSN, CCM Community Care Coordinator [place]  [facility] Mobile: [phone]

## 2020-07-18 NOTE — Chronic Care Management (AMB) (Signed)
Care Management    RN Visit Note  07/18/2020 Name: Joshua Lee MRN: [id number] DOB: [birth year]  Subjective: Joshua Lee is a 55 y.o. year old male who is a primary care patient of Joshua Roys, DO. The care management team was consulted for assistance with disease management and care coordination needs.    Engaged with patient by telephone for follow up visit in response to provider referral for case management and/or care coordination services.   Consent to Services:   Joshua Lee was given information about Care Management services today including:  Care Management services includes personalized support from designated clinical staff supervised by his physician, including individualized plan of care and coordination with other care providers 24/7 contact phone numbers for assistance for urgent and routine care needs. The patient may stop case management services at any time by phone call to the office staff.  Patient agreed to services and consent obtained.   Assessment: Review of patient past medical history, allergies, medications, health status, including review of consultants reports, laboratory and other test data, was performed as part of comprehensive evaluation and provision of chronic care management services.   SDOH (Social Determinants of Health) assessments and interventions performed:    Care Plan  Allergies  Allergen Reactions   Penicillins Rash    Has patient had a PCN reaction causing immediate rash, facial/tongue/throat swelling, SOB or lightheadedness with hypotension: unknown Has patient had a PCN reaction causing severe rash involving mucus membranes or skin necrosis: unknown Has patient had a PCN reaction that required hospitalization: unknown Has patient had a PCN reaction occurring within the last 10 years: no If all of the above answers are "NO", then may proceed with Cephalosporin use.     Outpatient Encounter Medications as of 07/18/2020   Medication Sig   acetaminophen (TYLENOL) 650 MG CR tablet Take 650-1,950 mg by mouth every 8 (eight) hours as needed for pain.   aspirin 81 MG tablet Take 81 mg by mouth daily.   atorvastatin (LIPITOR) 80 MG tablet Take 1 tablet (80 mg total) by mouth at bedtime.   benazepril (LOTENSIN) 40 MG tablet Take 1 tablet (40 mg total) by mouth daily.   calcium carbonate (TUMS EX) 750 MG chewable tablet Chew 2 tablets by mouth daily as needed for heartburn.    cetirizine (ZYRTEC) 10 MG tablet Take 10 mg by mouth daily as needed for allergies.    clindamycin (CLEOCIN) 150 MG capsule SMARTSIG:4 Capsule(s) By Mouth   clindamycin (CLEOCIN) 300 MG capsule Take 300 mg by mouth 3 (three) times daily.   Coenzyme Q-10 100 MG capsule Take 100 mg by mouth daily.   CONTOUR NEXT TEST test strip USE AS DIRECTED   etodolac (LODINE) 400 MG tablet Take by mouth.   Garlic 1829 MG CAPS Take 1,000 mg by mouth daily.   JARDIANCE 25 MG TABS tablet TAKE 1 TABLET(25 MG) BY MOUTH DAILY   metFORMIN (GLUCOPHAGE) 500 MG tablet Take 2 tablets (1,000 mg total) by mouth 2 (two) times daily with a meal.   metoprolol tartrate (LOPRESSOR) 100 MG tablet Take 1 tablet (100 mg total) by mouth 2 (two) times daily.   Miconazole Nitrate 2 % AERO Apply 1 application topically daily as needed.   OZEMPIC, 1 MG/DOSE, 4 MG/3ML SOPN INJECT 1MG INTO THE SKIN ONCE A WEEK   Potassium Citrate 15 MEQ (1620 MG) TBCR Take 15 mEq by mouth 2 (two) times daily. 2 tab QAM, 1 tab QPM  tamsulosin (FLOMAX) 0.4 MG CAPS capsule TAKE 1 CAPSULE(0.4 MG) BY MOUTH DAILY   tolnaftate (TINACTIN) 1 % spray Apply 1 application topically as needed.   VASCEPA 1 g capsule Take 2 g by mouth 2 (two) times daily.   No facility-administered encounter medications on file as of 07/18/2020.    Patient Active Problem List   Diagnosis Date Noted   Type 2 diabetes mellitus with stage 3 chronic kidney disease, without long-term current use of insulin ([place]) 03/06/2019   Morbid  obesity ([place]) 11/10/2017   CKD (chronic kidney disease), stage II 03/16/2016   Plantar fasciitis 04/17/2015   Osteoarthritis of both feet 03/05/2015   Nephrolithiasis 12/02/2014   Hypertension 07/02/2014   Hyperlipidemia 07/02/2014   Obesity, diabetes, and hypertension syndrome ([place]) 07/02/2014   Difficult airway for intubation 05/09/2014    Conditions to be addressed/monitored: HLD and DMII  Care Plan : RNCM: Diabetes Type 2 (Adult)  Updates made by Vanita Ingles since 07/18/2020 12:00 AM     Problem: RNCM: Glycemic Management (Diabetes, Type 2)   Priority: Medium     Long-Range Goal: RNCM: Glycemic Management Optimized   Start Date: 03/12/2020  Expected End Date: 07/13/2021  This Visit's Progress: On track  Priority: Medium  Note:   Objective:  Lab Results  Component Value Date   HGBA1C 6.6 02/28/2020   Lab Results  Component Value Date   CREATININE 0.86 02/28/2020   CREATININE 1.10 09/06/2019   CREATININE 1.06 04/03/2019   No results found for: EGFR Current Barriers:  Knowledge Deficits related to basic Diabetes pathophysiology and self care/management Knowledge Deficits related to medications used for management of diabetes Limited Social Support Lacks social connections Does not contact provider office for questions/concerns Case Manager Clinical Goal(s):  patient will demonstrate improved adherence to prescribed treatment plan for diabetes self care/management as evidenced by: daily monitoring and recording of CBG  adherence to ADA/ carb modified diet adherence to prescribed medication regimen contacting provider for new or worsened symptoms or questions Interventions:  Collaboration with Joshua Roys, DO regarding development and update of comprehensive plan of care as evidenced by provider attestation and co-signature Inter-disciplinary care team collaboration (see longitudinal plan of care) Provided education to patient about basic DM disease process.  05-14-2020: Review of the patients DM management and the patient states that he is doing well. He states his blood sugars are around 113 to 130.  The patient states he has not experienced any lows at this time. 07-18-2020: The patient has not been checking his blood sugars. Last reading at home was 06/14/2020 and it was 99. Discussed the need to check blood sugars on a consistent basis and write the numbers down. The patient states he has gotten slack but states he will start back recording his readings. Review of upcoming appointment in August with the pcp and ask the patient to bring readings in when he comes to see the pcp. Discussed the goal of <130 fasting and <180 post prandial. The patient verbalized understanding.  Reviewed medications with patient and discussed importance of medication adherence. 07-18-2020: The patient is compliant with medications  Discussed plans with patient for ongoing care management follow up and provided patient with direct contact information for care management team Provided patient with written educational materials related to hypo and hyperglycemia and importance of correct treatment. 07-18-2020: Review of the sx and sx of hypo and hyperglycemia. The patient denies any sx or sx. Education on monitoring for changes and if he feels  differently to take his blood sugars. Education and support given.  Reviewed scheduled/upcoming provider appointments including: 08-28-2020 at 10 am Advised patient, providing education and rationale, to check cbg as directed  and record, calling pcp for findings outside established parameters.  07-18-2020: Education and support given on checking blood sugars daily and when feeling different. The patient denies any issues with his DM.  Review of patient status, including review of consultants reports, relevant laboratory and other test results, and medications completed. Patient Goals: - barriers to adherence to treatment plan identified - blood glucose  monitoring encouraged - blood glucose readings reviewed - resources required to improve adherence to care identified - self-awareness of signs/symptoms of hypo or hyperglycemia encouraged - use of blood glucose monitoring log promoted Self-Care Activities - Self administers oral medications as prescribed Self administers insulin as prescribed Attends all scheduled provider appointments Checks blood sugars as prescribed and utilize hyper and hypoglycemia protocol as needed Adheres to prescribed ADA/carb modified Follow Up Plan: Telephone follow up appointment with care management team member scheduled for: 09-26-2020 at 1 pm    Task: RNCM: Alleviate Barriers to Glycemic Management Completed 07/18/2020  Outcome: Positive  Note:   Care Management Activities:    - barriers to adherence to treatment plan identified - blood glucose monitoring encouraged - blood glucose readings reviewed - resources required to improve adherence to care identified - self-awareness of signs/symptoms of hypo or hyperglycemia encouraged - use of blood glucose monitoring log promoted        Care Plan : RNCM: HLD Management  Updates made by Vanita Ingles since 07/18/2020 12:00 AM     Problem: RNCM: HLD Management   Priority: Medium     Long-Range Goal: RNCM: HLD Management   Start Date: 03/12/2020  Expected End Date: 05/14/2021  This Visit's Progress: On track  Priority: Medium  Note:   Current Barriers:  Poorly controlled hyperlipidemia, complicated by DM, smoker Current antihyperlipidemic regimen: Lipitor 80 mg Most recent lipid panel:     Component Value Date/Time   CHOL 98 (L) 02/28/2020 0945   CHOL 108 08/29/2018 1045   TRIG 173 (H) 02/28/2020 0945   TRIG 221 (H) 08/29/2018 1045   HDL 25 (L) 02/28/2020 0945   CHOLHDL 6.5 (H) 02/09/2017 1002   VLDL 44 (H) 08/29/2018 1045   [place] 44 02/28/2020 0945   ASCVD risk enhancing conditions: age 34, DM, HTN, CKD, current smoker Unable to  independently HLD Does not adhere to provider recommendations re: heart healthy/ADA diet, checking blood sugars as directed, smoker Does not attend all scheduled provider appointments Lacks social connections Does not maintain contact with provider office Does not contact provider office for questions/concerns RN Care Manager Clinical Goal(s):  patient will work with [place], providers, and care team towards execution of optimized self-health management plan patient will verbalize understanding of plan for effective management of HLD  patient will work with [facility], CCM team, and pcp  to address needs related to management of HLD patient will take all medications exactly as prescribed and will call provider for medication related questions patient will attend all scheduled medical appointments: 08-28-2020 at 10 am  patient will demonstrate improved health management independence the patient will demonstrate ongoing self health care management ability Interventions: Collaboration with Joshua Roys, DO regarding development and update of comprehensive plan of care as evidenced by provider attestation and co-signature Inter-disciplinary care team collaboration (see longitudinal plan of care) Medication review performed; medication list updated in electronic  medical record.  Inter-disciplinary care team collaboration (see longitudinal plan of care) Referred to pharmacy team for assistance with HLD medication management Evaluation of current treatment plan related to HLD and patient's adherence to plan as established by provider. Advised patient to call the office for changes in condition or questions, to keep upcoming appointment with pcp Provided education to patient re: heart healthy/ADA diet, and keeping watch on blood sugars Reviewed medications with patient and discussed compliance  Provided patient with heart healthy/ADA diet educational materials related to management of HLD with  diet and healthy lifestyle Reviewed scheduled/upcoming provider appointments including: 08-28-2020 at 10 am Discussed plans with patient for ongoing care management follow up and provided patient with direct contact information for care management team Patient Goals/Self-Care Activities: - call for medicine refill 2 or 3 days before it runs out - call if I am sick and can't take my medicine - keep a list of all the medicines I take; vitamins and herbals too - learn to read medicine labels - use a pillbox to sort medicine - use an alarm clock or phone to remind me to take my medicine - change to whole grain breads, cereal, pasta - drink 6 to 8 glasses of water each day - eat 3 to 5 servings of fruits and vegetables each day - eat 5 or 6 small meals each day - eat fish at least once per week - fill half the plate with nonstarchy vegetables - limit fast food meals to no more than 1 per week - prepare main meal at home 3 to 5 days each week - read food labels for fat, fiber, carbohydrates and portion size - reduce red meat to 2 to 3 times a week - be open to making changes - I can manage, know and watch for signs of a heart attack - if I have chest pain, call for help - learn about small changes that will make a big difference - learn my personal risk factors  Follow Up Plan: Telephone follow up appointment with care management team member scheduled for: 08-28-2020 at 10 am      Task: RNCM:HLD Completed 07/18/2020  Outcome: Positive  Note:   Care Management Activities:    - barriers to meeting goals identified - change-talk evoked - choices provided - collaboration with team encouraged - decision-making supported - difficulty of making life-long changes acknowledged - health risks reviewed - problem-solving facilitated - questions answered - readiness for change evaluated - reassurance provided - self-reflection promoted - self-reliance encouraged         Plan: Telephone  follow up appointment with care management team member scheduled for:  09-26-2020 at 1 pm  [place], MSN, [facility] Mobile: [phone]

## 2020-07-22 ENCOUNTER — Other Ambulatory Visit: Payer: Self-pay | Admitting: Family Medicine

## 2020-08-21 ENCOUNTER — Other Ambulatory Visit: Payer: Self-pay | Admitting: Family Medicine

## 2020-08-21 DIAGNOSIS — N183 Chronic kidney disease, stage 3 unspecified: Secondary | ICD-10-CM

## 2020-08-21 DIAGNOSIS — E1122 Type 2 diabetes mellitus with diabetic chronic kidney disease: Secondary | ICD-10-CM

## 2020-08-27 ENCOUNTER — Ambulatory Visit: Payer: Self-pay | Admitting: General Practice

## 2020-08-27 DIAGNOSIS — N183 Chronic kidney disease, stage 3 unspecified: Secondary | ICD-10-CM

## 2020-08-27 DIAGNOSIS — E782 Mixed hyperlipidemia: Secondary | ICD-10-CM

## 2020-08-27 DIAGNOSIS — U071 COVID-19: Secondary | ICD-10-CM

## 2020-08-27 NOTE — Chronic Care Management (AMB) (Signed)
Care Management    RN Visit Note  08/27/2020 Name: Joshua Lee MRN: [id number] DOB: 1965-10-19  Subjective: Joshua Lee is a 55 y.o. year old male who is a primary care patient of [name], DO. The care management team was consulted for assistance with disease management and care coordination needs.    Engaged with patient by telephone for follow up visit in response to provider referral for case management and/or care coordination services.   Consent to Services:   [name] was given information about Care Management services today including:  Care Management services includes personalized support from designated clinical staff supervised by his physician, including individualized plan of care and coordination with other care providers 24/7 contact phone numbers for assistance for urgent and routine care needs. The patient may stop case management services at any time by phone call to the office staff.  Patient agreed to services and consent obtained.   Assessment: Review of patient past medical history, allergies, medications, health status, including review of consultants reports, laboratory and other test data, was performed as part of comprehensive evaluation and provision of chronic care management services.   SDOH (Social Determinants of Health) assessments and interventions performed:  SDOH Interventions    Flowsheet Row Most Recent Value  SDOH Interventions   Physical Activity Interventions Other (Comments)  [no structured activity]        Care Plan  Allergies  Allergen Reactions   Penicillins Rash    Has patient had a PCN reaction causing immediate rash, facial/tongue/throat swelling, SOB or lightheadedness with hypotension: unknown Has patient had a PCN reaction causing severe rash involving mucus membranes or skin necrosis: unknown Has patient had a PCN reaction that required hospitalization: unknown Has patient had a PCN reaction occurring  within the last 10 years: no If all of the above answers are "NO", then may proceed with Cephalosporin use.     Outpatient Encounter Medications as of 08/27/2020  Medication Sig   acetaminophen (TYLENOL) 650 MG CR tablet Take 650-1,950 mg by mouth every 8 (eight) hours as needed for pain.   amLODipine (NORVASC) 5 MG tablet Take 5 mg by mouth daily.   aspirin 81 MG tablet Take 81 mg by mouth daily.   atorvastatin (LIPITOR) 80 MG tablet Take 1 tablet (80 mg total) by mouth at bedtime.   azithromycin (ZITHROMAX) 250 MG tablet Take 250 mg by mouth as directed.   benazepril (LOTENSIN) 40 MG tablet Take 1 tablet (40 mg total) by mouth daily.   benzonatate (TESSALON) 200 MG capsule Take by mouth.   calcium carbonate (TUMS EX) 750 MG chewable tablet Chew 2 tablets by mouth daily as needed for heartburn.    cetirizine (ZYRTEC) 10 MG tablet Take 10 mg by mouth daily as needed for allergies.    clindamycin (CLEOCIN) 150 MG capsule SMARTSIG:4 Capsule(s) By Mouth   clindamycin (CLEOCIN) 300 MG capsule Take 300 mg by mouth 3 (three) times daily.   Coenzyme Q-10 100 MG capsule Take 100 mg by mouth daily.   CONTOUR NEXT TEST test strip USE AS DIRECTED   etodolac (LODINE) 400 MG tablet Take by mouth.   Garlic 2023 MG CAPS Take 1,000 mg by mouth daily.   JARDIANCE 25 MG TABS tablet TAKE 1 TABLET(25 MG) BY MOUTH DAILY   metFORMIN (GLUCOPHAGE) 500 MG tablet TAKE 2 TABLETS(1000 MG) BY MOUTH TWICE DAILY WITH A MEAL   metoprolol tartrate (LOPRESSOR) 100 MG tablet Take 1 tablet (100 mg total)  by mouth 2 (two) times daily.   Miconazole Nitrate 2 % AERO Apply 1 application topically daily as needed.   Potassium Citrate 15 MEQ (1620 MG) TBCR Take 15 mEq by mouth 2 (two) times daily. 2 tab QAM, 1 tab QPM   Semaglutide, 1 MG/DOSE, (OZEMPIC, 1 MG/DOSE,) 4 MG/3ML SOPN INJECT 1 MG INTO THE SKIN ONCE A WEEK   tamsulosin (FLOMAX) 0.4 MG CAPS capsule TAKE 1 CAPSULE(0.4 MG) BY MOUTH DAILY   tolnaftate (TINACTIN) 1 % spray  Apply 1 application topically as needed.   VASCEPA 1 g capsule Take 2 g by mouth 2 (two) times daily.   No facility-administered encounter medications on file as of 08/27/2020.    Patient Active Problem List   Diagnosis Date Noted   Type 2 diabetes mellitus with stage 3 chronic kidney disease, without long-term current use of insulin ([place]) 03/06/2019   Morbid obesity ([place]) 11/10/2017   CKD (chronic kidney disease), stage II 03/16/2016   Plantar fasciitis 04/17/2015   Osteoarthritis of both feet 03/05/2015   Nephrolithiasis 12/02/2014   Hypertension 07/02/2014   Hyperlipidemia 07/02/2014   Obesity, diabetes, and hypertension syndrome ([place]) 07/02/2014   Difficult airway for intubation 05/09/2014    Conditions to be addressed/monitored: HLD, DMII, and Post COVID 19 assessment   Care Plan : RNCM: Diabetes Type 2 (Adult)  Updates made by Vanita Ingles since 08/27/2020 12:00 AM     Problem: RNCM: Glycemic Management (Diabetes, Type 2)   Priority: Medium     Long-Range Goal: RNCM: Glycemic Management Optimized   Start Date: 03/12/2020  Expected End Date: 07/13/2021  This Visit's Progress: On track  Recent Progress: On track  Priority: Medium  Note:   Objective:  Lab Results  Component Value Date   HGBA1C 6.6 02/28/2020   Lab Results  Component Value Date   CREATININE 0.96 06/10/2020   CREATININE 0.86 02/28/2020   CREATININE 1.10 09/06/2019   No results found for: EGFR Current Barriers:  Knowledge Deficits related to basic Diabetes pathophysiology and self care/management Knowledge Deficits related to medications used for management of diabetes Limited Social Support Lacks social connections Does not contact provider office for questions/concerns Case Manager Clinical Goal(s):  patient will demonstrate improved adherence to prescribed treatment plan for diabetes self care/management as evidenced by: daily monitoring and recording of CBG  adherence to ADA/ carb modified  diet adherence to prescribed medication regimen contacting provider for new or worsened symptoms or questions Interventions:  Collaboration with [name], DO regarding development and update of comprehensive plan of care as evidenced by provider attestation and co-signature Inter-disciplinary care team collaboration (see longitudinal plan of care) Provided education to patient about basic DM disease process. 05-14-2020: Review of the patients DM management and the patient states that he is doing well. He states his blood sugars are around 113 to 130.  The patient states he has not experienced any lows at this time. 07-18-2020: The patient has not been checking his blood sugars. Last reading at home was 06/14/2020 and it was 99. Discussed the need to check blood sugars on a consistent basis and write the numbers down. The patient states he has gotten slack but states he will start back recording his readings. Review of upcoming appointment in August with the pcp and ask the patient to bring readings in when he comes to see the pcp. Discussed the goal of <130 fasting and <180 post prandial. The patient verbalized understanding. 08-27-2020: The patient had been checking his  blood sugars better until recently when he had COVID 19. He is recovered well and will start back checking his blood sugars. Discussed the importance of checking blood sugars regularly especially when he is sick and not feeling well. The patient denies any lows. Will continue to monitor for changes.  Reviewed medications with patient and discussed importance of medication adherence. 08-27-2020: The patient is compliant with medications  Discussed plans with patient for ongoing care management follow up and provided patient with direct contact information for care management team Provided patient with written educational materials related to hypo and hyperglycemia and importance of correct treatment. 08-27-2020: Review of the sx and sx of  hypo and hyperglycemia. The patient denies any sx or sx. Education on monitoring for changes and if he feels differently to take his blood sugars. Education and support given.  Reviewed scheduled/upcoming provider appointments including: 08-28-2020 at 10 am Advised patient, providing education and rationale, to check cbg as directed  and record, calling pcp for findings outside established parameters.  08-27-2020: Education and support given on checking blood sugars daily and when feeling different. The patient denies any issues with his DM.  Review of patient status, including review of consultants reports, relevant laboratory and other test results, and medications completed. Patient Goals: - barriers to adherence to treatment plan identified - blood glucose monitoring encouraged - blood glucose readings reviewed - resources required to improve adherence to care identified - self-awareness of signs/symptoms of hypo or hyperglycemia encouraged - use of blood glucose monitoring log promoted Self-Care Activities - Self administers oral medications as prescribed Self administers insulin as prescribed Attends all scheduled provider appointments Checks blood sugars as prescribed and utilize hyper and hypoglycemia protocol as needed Adheres to prescribed ADA/carb modified Follow Up Plan: Telephone follow up appointment with care management team member scheduled for: 09-26-2020 at 1 pm    Care Plan : RNCM: HLD Management  Updates made by Vanita Ingles since 08/27/2020 12:00 AM     Problem: RNCM: HLD Management   Priority: Medium     Long-Range Goal: RNCM: HLD Management   Start Date: 03/12/2020  Expected End Date: 05/14/2021  This Visit's Progress: On track  Recent Progress: On track  Priority: Medium  Note:   Current Barriers:  Poorly controlled hyperlipidemia, complicated by DM, smoker Current antihyperlipidemic regimen: Lipitor 80 mg Most recent lipid panel:     Component Value  Date/Time   CHOL 98 (L) 02/28/2020 0945   CHOL 108 08/29/2018 1045   TRIG 173 (H) 02/28/2020 0945   TRIG 221 (H) 08/29/2018 1045   HDL 25 (L) 02/28/2020 0945   CHOLHDL 6.5 (H) 02/09/2017 1002   VLDL 44 (H) 08/29/2018 1045   [place] 44 02/28/2020 0945   ASCVD risk enhancing conditions: age 70, DM, HTN, CKD, current smoker Unable to independently HLD Does not adhere to provider recommendations re: heart healthy/ADA diet, checking blood sugars as directed, smoker Does not attend all scheduled provider appointments Lacks social connections Does not maintain contact with provider office Does not contact provider office for questions/concerns RN Care Manager Clinical Goal(s):  patient will work with Consulting civil engineer, providers, and care team towards execution of optimized self-health management plan patient will verbalize understanding of plan for effective management of HLD  patient will work with [facility], CCM team, and pcp  to address needs related to management of HLD patient will take all medications exactly as prescribed and will call provider for medication related questions patient will attend all  scheduled medical appointments: 08-28-2020 at 10 am  patient will demonstrate improved health management independence the patient will demonstrate ongoing self health care management ability Interventions: Collaboration with [name], DO regarding development and update of comprehensive plan of care as evidenced by provider attestation and co-signature Inter-disciplinary care team collaboration (see longitudinal plan of care) Medication review performed; medication list updated in electronic medical record.  Inter-disciplinary care team collaboration (see longitudinal plan of care) Referred to pharmacy team for assistance with HLD medication management Evaluation of current treatment plan related to HLD and patient's adherence to plan as established by provider. 08-27-2020: The patient has  been eating some fried foods since getting over [place]. Education on following a heart healthy/ADA diet. Reviewed reading labels and healthy food options. The patient verbalized understanding.  Advised patient to call the office for changes in condition or questions, to keep upcoming appointment with pcp Provided education to patient re: heart healthy/ADA diet, and keeping watch on blood sugars Reviewed medications with patient and discussed compliance  Provided patient with heart healthy/ADA diet educational materials related to management of HLD with diet and healthy lifestyle Reviewed scheduled/upcoming provider appointments including: 08-28-2020 at 10 am Discussed plans with patient for ongoing care management follow up and provided patient with direct contact information for care management team Patient Goals/Self-Care Activities: - call for medicine refill 2 or 3 days before it runs out - call if I am sick and can't take my medicine - keep a list of all the medicines I take; vitamins and herbals too - learn to read medicine labels - use a pillbox to sort medicine - use an alarm clock or phone to remind me to take my medicine - change to whole grain breads, cereal, pasta - drink 6 to 8 glasses of water each day - eat 3 to 5 servings of fruits and vegetables each day - eat 5 or 6 small meals each day - eat fish at least once per week - fill half the plate with nonstarchy vegetables - limit fast food meals to no more than 1 per week - prepare main meal at home 3 to 5 days each week - read food labels for fat, fiber, carbohydrates and portion size - reduce red meat to 2 to 3 times a week - be open to making changes - I can manage, know and watch for signs of a heart attack - if I have chest pain, call for help - learn about small changes that will make a big difference - learn my personal risk factors  Follow Up Plan: Telephone follow up appointment with care management team member  scheduled for: 09-26-2020 at 1 pm       Plan: Telephone follow up appointment with care management team member scheduled for:  09-26-2020 at 1 pm  [place] [address], MSN, [facility] Mobile: [phone]

## 2020-08-27 NOTE — Patient Instructions (Signed)
Visit Information   Goals Addressed             This Visit's Progress    RNCM: Monitor and Manage My Blood Sugar-Diabetes Type 2       Timeframe:  Long-Range Goal Priority:  High Start Date:     07-18-2021                        Expected End Date:       08-16-2021                Follow Up Date 09/26/2020    - check blood sugar at prescribed times - check blood sugar before and after exercise - check blood sugar if I feel it is too high or too low - enter blood sugar readings and medication or insulin into daily log - take the blood sugar log to all doctor visits - take the blood sugar meter to all doctor visits    Why is this important?   Checking your blood sugar at home helps to keep it from getting very high or very low.  Writing the results in a diary or log helps the doctor know how to care for you.  Your blood sugar log should have the time, date and the results.  Also, write down the amount of insulin or other medicine that you take.  Other information, like what you ate, exercise done and how you were feeling, will also be helpful.     Notes: 07-18-2020: The last time the patient took his blood sugar at home was 06-14-2020 with a reading of 99.  The patient states he has gotten slack. Education on the need to check blood sugars on a consistent bases and record. The patient denies any lows and endorses that he will start back checking his blood sugars. Discussed fasting of <130 and post prandial of <180. 08-27-2020: The patient was taking his blood sugars up until recently when he had COVID. Education on getting back to checking his blood sugars. Sees the pcp on 08-28-2020.      RNCM: Post COVID Assessment       08-16-2020 the patient went to the walk in clinic for sore throat and sinus drainage. The patient tested positive for COVID 19. The patient took a z-pack and tessalon perles. The patient states that he is feeling much better now. He is out of quarantine now. Sees the pcp on  08-28-2020. Patient goals:  -report any new sx and sx of COVID 19 -practice good hand hygiene -wear mask when outside of home -stay out of crowds -follow CDC guidelines -call office for changes, questions, or concerns         The patient verbalized understanding of instructions, educational materials, and care plan provided today and declined offer to receive copy of patient instructions, educational materials, and care plan.   Telephone follow up appointment with care management team member scheduled for:09-26-2020 at 1 pm  Alto Denver RN, MSN, CCM Community Care Coordinator [place]  [facility] Mobile: [phone]

## 2020-08-28 ENCOUNTER — Encounter: Payer: Self-pay | Admitting: Family Medicine

## 2020-08-28 ENCOUNTER — Ambulatory Visit (INDEPENDENT_AMBULATORY_CARE_PROVIDER_SITE_OTHER): Payer: BLUE CROSS/BLUE SHIELD | Admitting: Family Medicine

## 2020-08-28 ENCOUNTER — Other Ambulatory Visit: Payer: Self-pay

## 2020-08-28 VITALS — BP 102/64 | HR 85 | Temp 97.4°F | Ht 68.5 in | Wt 244.6 lb

## 2020-08-28 DIAGNOSIS — N182 Chronic kidney disease, stage 2 (mild): Secondary | ICD-10-CM | POA: Diagnosis not present

## 2020-08-28 DIAGNOSIS — E1122 Type 2 diabetes mellitus with diabetic chronic kidney disease: Secondary | ICD-10-CM

## 2020-08-28 DIAGNOSIS — L0591 Pilonidal cyst without abscess: Secondary | ICD-10-CM

## 2020-08-28 DIAGNOSIS — Z0000 Encounter for general adult medical examination without abnormal findings: Secondary | ICD-10-CM

## 2020-08-28 DIAGNOSIS — I10 Essential (primary) hypertension: Secondary | ICD-10-CM | POA: Diagnosis not present

## 2020-08-28 DIAGNOSIS — E782 Mixed hyperlipidemia: Secondary | ICD-10-CM

## 2020-08-28 DIAGNOSIS — N183 Chronic kidney disease, stage 3 unspecified: Secondary | ICD-10-CM

## 2020-08-28 DIAGNOSIS — Z125 Encounter for screening for malignant neoplasm of prostate: Secondary | ICD-10-CM

## 2020-08-28 LAB — MICROALBUMIN, URINE WAIVED
Creatinine, Urine Waived: 50 mg/dL (ref 10–300)
Microalb, Ur Waived: 10 mg/L (ref 0–19)
Microalb/Creat Ratio: <30 mg/g (ref <30)

## 2020-08-28 LAB — URINALYSIS, ROUTINE W REFLEX MICROSCOPIC
Bilirubin, UA: NEGATIVE
Ketones, UA: NEGATIVE
Leukocytes,UA: NEGATIVE
Nitrite, UA: NEGATIVE
Protein,UA: NEGATIVE
RBC, UA: NEGATIVE
Specific Gravity, UA: 1.01 (ref 1.005–1.030)
Urobilinogen, Ur: 0.2 mg/dL (ref 0.2–1.0)
pH, UA: 6.5 (ref 5.0–7.5)

## 2020-08-28 LAB — BAYER DCA HB A1C WAIVED: HB A1C (BAYER DCA - WAIVED): 6.5 % (ref <7.0)

## 2020-08-28 MED ORDER — METFORMIN HCL 500 MG PO TABS: ORAL_TABLET | ORAL | 1 refills | Status: DC

## 2020-08-28 MED ORDER — ATORVASTATIN CALCIUM 80 MG PO TABS: 80.0000 mg | ORAL_TABLET | Freq: Every day | ORAL | 1 refills | Status: DC

## 2020-08-28 MED ORDER — METOPROLOL TARTRATE 100 MG PO TABS: 100.0000 mg | ORAL_TABLET | Freq: Two times a day (BID) | ORAL | 1 refills | Status: DC

## 2020-08-28 MED ORDER — BENAZEPRIL HCL 40 MG PO TABS: 40.0000 mg | ORAL_TABLET | Freq: Every day | ORAL | 1 refills | Status: DC

## 2020-08-28 MED ORDER — VASCEPA 1 G PO CAPS: 2.0000 g | ORAL_CAPSULE | Freq: Two times a day (BID) | ORAL | 1 refills | Status: DC

## 2020-08-28 MED ORDER — OZEMPIC (1 MG/DOSE) 4 MG/3ML ~~LOC~~ SOPN: PEN_INJECTOR | SUBCUTANEOUS | 1 refills | Status: DC

## 2020-08-28 MED ORDER — AMLODIPINE BESYLATE 5 MG PO TABS: 5.0000 mg | ORAL_TABLET | Freq: Every day | ORAL | 1 refills | Status: DC

## 2020-08-28 MED ORDER — TAMSULOSIN HCL 0.4 MG PO CAPS: ORAL_CAPSULE | ORAL | 1 refills | Status: DC

## 2020-08-28 MED ORDER — EMPAGLIFLOZIN 25 MG PO TABS: ORAL_TABLET | ORAL | 1 refills | Status: DC

## 2020-08-28 NOTE — Assessment & Plan Note (Signed)
Under good control with A1c of 6.5. Continue current regimen. Continue to monitor. Call with any concerns.  

## 2020-08-28 NOTE — Patient Instructions (Addendum)
You have an appointment with [facility] on Tuesday September 02, 2020 at 1:30 PM. Their address is [address], [place] [address]. Their phone number is [phone].

## 2020-08-28 NOTE — Assessment & Plan Note (Signed)
Rechecking labs today. Await results. Treat as needed.  °

## 2020-08-28 NOTE — Assessment & Plan Note (Signed)
Under good control on current regimen. Continue current regimen. Continue to monitor. Call with any concerns. Refills given. Labs drawn today.   

## 2020-08-28 NOTE — Progress Notes (Signed)
BP 102/64   Pulse 85   Temp (!) 97.4 F (36.3 C) (Oral)   Ht 5' 8.5" (1.74 m)   Wt 244 lb 9.6 oz (110.9 kg)   SpO2 97%   BMI 36.65 kg/m    Subjective:    Patient ID: Joshua Lee, male    DOB: 1965-08-21, 55 y.o.   MRN: [id number]  HPI: Joshua Lee is a 55 y.o. male presenting on 08/28/2020 for comprehensive medical examination. Current medical complaints include:  HYPERTENSION / HYPERLIPIDEMIA Satisfied with current treatment? yes Duration of hypertension: chronic BP monitoring frequency: not checking BP medication side effects: no Past BP meds: metoprolol, amlodipine, benazepril Duration of hyperlipidemia: chronic Cholesterol medication side effects: no Cholesterol supplements:  garlic and fish oil Past cholesterol medications: lovaza, atorvastatin Medication compliance: excellent compliance Aspirin: yes Recent stressors: no Recurrent headaches: no Visual changes: no Palpitations: no Dyspnea: no Chest pain: no Lower extremity edema: no Dizzy/lightheaded: no  DIABETES Hypoglycemic episodes:no Polydipsia/polyuria: no Visual disturbance: no Chest pain: no Paresthesias: no Glucose Monitoring: yes  Accucheck frequency: Not Checking Taking Insulin?: yes Blood Pressure Monitoring: not checking Retinal Examination: Up to Date Foot Exam:  Done today Diabetic Education: Completed Pneumovax: Up to Date Influenza: Up to Date Aspirin: yes  Interim Problems from his last visit: no  Depression Screen done today and results listed below:  Depression screen [facility] 2/9 08/28/2020 08/27/2020 06/10/2020 05/15/2019 03/06/2019  Decreased Interest 0 0 0 0 0  Down, Depressed, Hopeless 0 0 0 0 0  PHQ - 2 Score 0 0 0 0 0  Altered sleeping - - - - 0  Tired, decreased energy - - - - 0  Change in appetite - - - - 0  Feeling bad or failure about yourself  - - - - 0  Trouble concentrating - - - - 0  Moving slowly or fidgety/restless - - - - 0  Suicidal thoughts - - - - 0   PHQ-9 Score - - - - 0  Difficult doing work/chores - - - - Not difficult at all    Past Medical History:  Past Medical History:  Diagnosis Date   Diabetes mellitus without complication (HCC)    GERD (gastroesophageal reflux disease)    Hyperlipidemia    Hypertension    Kidney stones    Plantar fascial fibromatosis     Surgical History:  Past Surgical History:  Procedure Laterality Date   CHOLECYSTECTOMY  2006   GALLBLADDER SURGERY     KIDNEY STONE SURGERY     KNEE ARTHROSCOPY     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   aspirin 81 MG tablet Take 81 mg by mouth daily.   calcium carbonate (TUMS EX) 750 MG chewable tablet Chew 2 tablets by mouth daily as needed for heartburn.    cetirizine (ZYRTEC) 10 MG tablet Take 10 mg by mouth daily as needed for allergies.    Coenzyme Q-10 100 MG capsule Take 100 mg by mouth daily.   CONTOUR NEXT TEST test strip USE AS DIRECTED   Garlic 1000 MG CAPS Take 1,000 mg by mouth daily.   Potassium Citrate 15 MEQ (  1620 MG) TBCR Take 15 mEq by mouth 2 (two) times daily. 2 tab QAM, 1 tab QPM   tolnaftate (TINACTIN) 1 % spray Apply 1 application topically as needed.   No current facility-administered medications on file prior to visit.    Allergies:  Allergies  Allergen Reactions   Penicillins Rash    Has patient had a PCN reaction causing immediate rash, facial/tongue/throat swelling, SOB or lightheadedness with hypotension: unknown Has patient had a PCN reaction causing severe rash involving mucus membranes or skin necrosis: unknown Has patient had a PCN reaction that required hospitalization:  unknown Has patient had a PCN reaction occurring within the last 10 years: no If all of the above answers are "NO", then may proceed with Cephalosporin use.     Social History:  Social History   Socioeconomic History   Marital status: Single    Spouse name: Not on file   Number of children: Not on file   Years of education: Not on file   Highest education level: Not on file  Occupational History   Not on file  Tobacco Use   Smoking status: Never   Smokeless tobacco: Current    Types: Snuff  Vaping Use   Vaping Use: Never used  Substance and Sexual Activity   Alcohol use: Not Currently    Comment: Occasionally drinks beer   Drug use: No   Sexual activity: Not on file  Other Topics Concern   Not on file  Social History Narrative   Not on file   Social Determinants of Health   Financial Resource Strain: Low Risk    Difficulty of Paying Living Expenses: Not very hard  Food Insecurity: No Food Insecurity   Worried About Running Out of Food in the Last Year: Never true   Ran Out of Food in the Last Year: Never true  Transportation Needs: No Transportation Needs   Lack of Transportation (Medical): No   Lack of Transportation (Non-Medical): No  Physical Activity: Inactive   Days of Exercise per Week: 0 days   Minutes of Exercise per Session: 0 min  Stress: No Stress Concern Present   Feeling of Stress : Not at all  Social Connections: Moderately Isolated   Frequency of Communication with Friends and Family: More than three times a week   Frequency of Social Gatherings with Friends and Family: More than three times a week   Attends Religious Services: More than 4 times per year   Active Member of [employer]ubs or Organizations: No   Attends Engineer, structuralClub or Organization Meetings: Never   Marital Status: Never married  Catering managerntimate Partner Violence: Not At Risk   Fear of Current or Ex-Partner: No   Emotionally Abused: No   Physically Abused: No   Sexually Abused: No   Social History    Tobacco Use  Smoking Status Never  Smokeless Tobacco Current   Types: Snuff   Social History   Substance and Sexual Activity  Alcohol Use Not Currently   Comment: Occasionally drinks beer    Family History:  Family History  Problem Relation Age of Onset   Diabetes Maternal Uncle     Past medical history, surgical history, medications, allergies, family history and social history reviewed with patient today and changes made to appropriate areas of the chart.   Review of Systems  Constitutional: Negative.   HENT: Negative.    Eyes: Negative.   Respiratory: Negative.    Cardiovascular: Negative.   Gastrointestinal: Negative.   Genitourinary: Negative.  Musculoskeletal: Negative.   Skin: Negative.   Neurological: Negative.   Endo/Heme/Allergies:  Negative for environmental allergies and polydipsia. Bruises/bleeds easily.  Psychiatric/Behavioral: Negative.    All other ROS negative except what is listed above and in the HPI.      Objective:    BP 102/64   Pulse 85   Temp (!) 97.4 F (36.3 C) (Oral)   Ht 5' 8.5" (1.74 m)   Wt 244 lb 9.6 oz (110.9 kg)   SpO2 97%   BMI 36.65 kg/m   Wt Readings from Last 3 Encounters:  08/28/20 244 lb 9.6 oz (110.9 kg)  06/10/20 245 lb 9.6 oz (111.4 kg)  02/28/20 250 lb 9.6 oz (113.7 kg)    Physical Exam Vitals and nursing note reviewed.  Constitutional:      General: He is not in acute distress.    Appearance: Normal appearance. He is obese. He is not ill-appearing, toxic-appearing or diaphoretic.  HENT:     Head: Normocephalic and atraumatic.     Right Ear: Tympanic membrane, ear canal and external ear normal. There is no impacted cerumen.     Left Ear: Tympanic membrane, ear canal and external ear normal. There is no impacted cerumen.     Nose: Nose normal. No congestion or rhinorrhea.     Mouth/Throat:     Mouth: Mucous membranes are moist.     Pharynx: Oropharynx is clear. No oropharyngeal exudate or posterior  oropharyngeal erythema.  Eyes:     General: No scleral icterus.       Right eye: No discharge.        Left eye: No discharge.     Extraocular Movements: Extraocular movements intact.     Conjunctiva/sclera: Conjunctivae normal.     Pupils: Pupils are equal, round, and reactive to light.  Neck:     Vascular: No carotid bruit.  Cardiovascular:     Rate and Rhythm: Normal rate and regular rhythm.     Pulses: Normal pulses.     Heart sounds: No murmur heard.   No friction rub. No gallop.  Pulmonary:     Effort: Pulmonary effort is normal. No respiratory distress.     Breath sounds: Normal breath sounds. No stridor. No wheezing, rhonchi or rales.  Chest:     Chest wall: No tenderness.  Abdominal:     General: Abdomen is flat. Bowel sounds are normal. There is no distension.     Palpations: Abdomen is soft. There is no mass.     Tenderness: There is no abdominal tenderness. There is no right CVA tenderness, left CVA tenderness, guarding or rebound.     Hernia: No hernia is present.  Genitourinary:    Comments: Genital exam deferred with shared decision making Musculoskeletal:        General: No swelling, tenderness, deformity or signs of injury.     Cervical back: Normal range of motion and neck supple. No rigidity. No muscular tenderness.     Right lower leg: No edema.     Left lower leg: No edema.  Lymphadenopathy:     Cervical: No cervical adenopathy.  Skin:    General: Skin is warm and dry.     Capillary Refill: Capillary refill takes less than 2 seconds.     Coloration: Skin is not jaundiced or pale.     Findings: No bruising, erythema, lesion or rash.  Neurological:     General: No focal deficit present.     Mental Status: He is  alert and oriented to person, place, and time.     Cranial Nerves: No cranial nerve deficit.     Sensory: No sensory deficit.     Motor: No weakness.     Coordination: Coordination normal.     Gait: Gait normal.     Deep Tendon Reflexes:  Reflexes normal.  Psychiatric:        Mood and Affect: Mood normal.        Behavior: Behavior normal.        Thought Content: Thought content normal.        Judgment: Judgment normal.    Results for orders placed or performed in visit on 08/28/20  Bayer DCA Hb A1c Waived  Result Value Ref Range   HB A1C (BAYER DCA - WAIVED) 6.5 <7.0 %  Microalbumin, Urine Waived  Result Value Ref Range   Microalb, Ur Waived 10 0 - 19 mg/L   Creatinine, Urine Waived 50 10 - 300 mg/dL   Microalb/Creat Ratio <30 <30 mg/g  Urinalysis, Routine w reflex microscopic  Result Value Ref Range   Specific Gravity, UA 1.010 1.005 - 1.030   pH, UA 6.5 5.0 - 7.5   Color, UA Yellow Yellow   Appearance Ur Clear Clear   Leukocytes,UA Negative Negative   Protein,UA Negative Negative/Trace   Glucose, UA 3+ (A) Negative   Ketones, UA Negative Negative   RBC, UA Negative Negative   Bilirubin, UA Negative Negative   Urobilinogen, Ur 0.2 0.2 - 1.0 mg/dL   Nitrite, UA Negative Negative      Assessment & Plan:   Problem List Items Addressed This Visit       Cardiovascular and Mediastinum   Hypertension    Under good control on current regimen. Continue current regimen. Continue to monitor. Call with any concerns. Refills given. Labs drawn today.        Relevant Medications   atorvastatin (LIPITOR) 80 MG tablet   benazepril (LOTENSIN) 40 MG tablet   empagliflozin (JARDIANCE) 25 MG TABS tablet   metoprolol tartrate (LOPRESSOR) 100 MG tablet   VASCEPA 1 g capsule   amLODipine (NORVASC) 5 MG tablet   Other Relevant Orders   CBC with Differential/Platelet   Comprehensive metabolic panel   Microalbumin, Urine Waived (Completed)   TSH     Endocrine   Type 2 diabetes mellitus with stage 3 chronic kidney disease, without long-term current use of insulin (HCC)    Under good control with A1c of 6.5. Continue current regimen. Continue to monitor. Call with any concerns.       Relevant Medications    atorvastatin (LIPITOR) 80 MG tablet   benazepril (LOTENSIN) 40 MG tablet   empagliflozin (JARDIANCE) 25 MG TABS tablet   metFORMIN (GLUCOPHAGE) 500 MG tablet   Semaglutide, 1 MG/DOSE, (OZEMPIC, 1 MG/DOSE,) 4 MG/3ML SOPN   Other Relevant Orders   Bayer DCA Hb A1c Waived (Completed)   CBC with Differential/Platelet   Comprehensive metabolic panel   Microalbumin, Urine Waived (Completed)     Genitourinary   CKD (chronic kidney disease), stage II    Rechecking labs today. Await results. Treat as needed.       Relevant Orders   CBC with Differential/Platelet   Comprehensive metabolic panel   Microalbumin, Urine Waived (Completed)   Urinalysis, Routine w reflex microscopic (Completed)     Other   Hyperlipidemia    Under good control on current regimen. Continue current regimen. Continue to monitor. Call with any concerns. Refills given. Labs  drawn today.        Relevant Medications   atorvastatin (LIPITOR) 80 MG tablet   benazepril (LOTENSIN) 40 MG tablet   metoprolol tartrate (LOPRESSOR) 100 MG tablet   VASCEPA 1 g capsule   amLODipine (NORVASC) 5 MG tablet   Other Relevant Orders   CBC with Differential/Platelet   Comprehensive metabolic panel   Lipid Panel w/o Chol/HDL Ratio   Other Visit Diagnoses     Routine general medical examination at a health care facility    -  Primary   Vaccines up to date. Screening labs checked today. Cologuard up to date. Continue diet and exercise. Call with any concerns.    Screening for prostate cancer       PSA drawn today. Await results.    Relevant Orders   PSA   Essential hypertension       Relevant Medications   atorvastatin (LIPITOR) 80 MG tablet   benazepril (LOTENSIN) 40 MG tablet   empagliflozin (JARDIANCE) 25 MG TABS tablet   metoprolol tartrate (LOPRESSOR) 100 MG tablet   VASCEPA 1 g capsule   amLODipine (NORVASC) 5 MG tablet   Pilonidal cyst       Will get him into surgery for evaluation. Appointment scheduled for next  week.    Relevant Orders   Ambulatory referral to General Surgery        Discussed aspirin prophylaxis for myocardial infarction prevention and decision was made to continue ASA  LABORATORY TESTING:  Health maintenance labs ordered today as discussed above.   The natural history of prostate cancer and ongoing controversy regarding screening and potential treatment outcomes of prostate cancer has been discussed with the patient. The meaning of a false positive PSA and a false negative PSA has been discussed. He indicates understanding of the limitations of this screening test and wishes to proceed with screening PSA testing.   IMMUNIZATIONS:   - Tdap: Tetanus vaccination status reviewed: last tetanus booster within 10 years. - Influenza: Postponed to flu season - Pneumovax: Up to date - Prevnar: Not applicable - COVID: Up to date - Shingrix vaccine: Refused  SCREENING: - Colonoscopy: Up to date  Discussed with patient purpose of the colonoscopy is to detect colon cancer at curable precancerous or early stages   PATIENT COUNSELING:    Sexuality: Discussed sexually transmitted diseases, partner selection, use of condoms, avoidance of unintended pregnancy  and contraceptive alternatives.   Advised to avoid cigarette smoking.  I discussed with the patient that most people either abstain from alcohol or drink within safe limits (<=14/week and <=4 drinks/occasion for males, <=7/weeks and <= 3 drinks/occasion for females) and that the risk for alcohol disorders and other health effects rises proportionally with the number of drinks per week and how often a drinker exceeds daily limits.  Discussed cessation/primary prevention of drug use and availability of treatment for abuse.   Diet: Encouraged to adjust caloric intake to maintain  or achieve ideal body weight, to reduce intake of dietary saturated fat and total fat, to limit sodium intake by avoiding high sodium foods and not adding  table salt, and to maintain adequate dietary potassium and calcium preferably from fresh fruits, vegetables, and low-fat dairy products.    stressed the importance of regular exercise  Injury prevention: Discussed safety belts, safety helmets, smoke detector, smoking near bedding or upholstery.   Dental health: Discussed importance of regular tooth brushing, flossing, and dental visits.   Follow up plan: NEXT PREVENTATIVE PHYSICAL DUE IN 1  YEAR. Return in about 6 months (around 02/28/2021).

## 2020-08-29 ENCOUNTER — Encounter: Payer: Self-pay | Admitting: Family Medicine

## 2020-08-29 LAB — COMPREHENSIVE METABOLIC PANEL
ALT: 26 IU/L (ref 0–44)
AST: 17 IU/L (ref 0–40)
Albumin/Globulin Ratio: 1.9 (ref 1.2–2.2)
Albumin: 4.5 g/dL (ref 3.8–4.9)
Alkaline Phosphatase: 49 IU/L (ref 44–121)
BUN/Creatinine Ratio: 14 (ref 9–20)
BUN: 12 mg/dL (ref 6–24)
Bilirubin Total: 0.6 mg/dL (ref 0.0–1.2)
CO2: 23 mmol/L (ref 20–29)
Calcium: 9.4 mg/dL (ref 8.7–10.2)
Chloride: 96 mmol/L (ref 96–106)
Creatinine, Ser: 0.83 mg/dL (ref 0.76–1.27)
Globulin, Total: 2.4 g/dL (ref 1.5–4.5)
Glucose: 210 mg/dL — ABNORMAL HIGH (ref 65–99)
Potassium: 4.2 mmol/L (ref 3.5–5.2)
Sodium: 136 mmol/L (ref 134–144)
Total Protein: 6.9 g/dL (ref 6.0–8.5)
eGFR: 104 mL/min/{1.73_m2} (ref >59)

## 2020-08-29 LAB — CBC WITH DIFFERENTIAL/PLATELET
Basophils Absolute: 0 10*3/uL (ref 0.0–0.2)
Basos: 0 %
EOS (ABSOLUTE): 0.1 10*3/uL (ref 0.0–0.4)
Eos: 2 %
Hematocrit: 45.1 % (ref 37.5–51.0)
Hemoglobin: 14.4 g/dL (ref 13.0–17.7)
Immature Grans (Abs): 0 10*3/uL (ref 0.0–0.1)
Immature Granulocytes: 0 %
Lymphocytes Absolute: 1.4 10*3/uL (ref 0.7–3.1)
Lymphs: 19 %
MCH: 26.6 pg (ref 26.6–33.0)
MCHC: 31.9 g/dL (ref 31.5–35.7)
MCV: 83 fL (ref 79–97)
Monocytes Absolute: 0.4 10*3/uL (ref 0.1–0.9)
Monocytes: 6 %
Neutrophils Absolute: 5.5 10*3/uL (ref 1.4–7.0)
Neutrophils: 73 %
Platelets: 250 10*3/uL (ref 150–450)
RBC: 5.41 x10E6/uL (ref 4.14–5.80)
RDW: 14.7 % (ref 11.6–15.4)
WBC: 7.5 10*3/uL (ref 3.4–10.8)

## 2020-08-29 LAB — LIPID PANEL W/O CHOL/HDL RATIO
Cholesterol, Total: 106 mg/dL (ref 100–199)
HDL: 27 mg/dL — ABNORMAL LOW (ref >39)
LDL Chol Calc (NIH): 44 mg/dL (ref 0–99)
Triglycerides: 220 mg/dL — ABNORMAL HIGH (ref 0–149)
VLDL Cholesterol Cal: 35 mg/dL (ref 5–40)

## 2020-08-29 LAB — TSH: TSH: 2.3 u[IU]/mL (ref 0.450–4.500)

## 2020-08-29 LAB — PSA: Prostate Specific Ag, Serum: 0.4 ng/mL (ref 0.0–4.0)

## 2020-09-01 ENCOUNTER — Telehealth: Payer: Self-pay

## 2020-09-01 NOTE — Telephone Encounter (Signed)
Copied from CRM [id number]. Topic: Referral - Status >> Sep 01, 2020 10:37 AM Randol Kern wrote: Reason for CRM: Pt called back to request a new referral, he says the current referral does not accept insurance. He needs to be referred to a facility that accepts his [facility]   Best contact: [phone] or [phone]   Brayton Caves, can you look into this please?

## 2020-09-02 ENCOUNTER — Other Ambulatory Visit: Payer: Self-pay

## 2020-09-02 ENCOUNTER — Ambulatory Visit (INDEPENDENT_AMBULATORY_CARE_PROVIDER_SITE_OTHER): Payer: BLUE CROSS/BLUE SHIELD | Admitting: General Surgery

## 2020-09-02 ENCOUNTER — Encounter: Payer: Self-pay | Admitting: General Surgery

## 2020-09-02 VITALS — BP 135/77 | HR 90 | Temp 97.7°F | Ht 68.0 in | Wt 247.4 lb

## 2020-09-02 DIAGNOSIS — L853 Xerosis cutis: Secondary | ICD-10-CM

## 2020-09-02 NOTE — Patient Instructions (Signed)
You have some skin irritation. You may try using A&D ointment or Desitin ointment to the area 1-2 times a day to help heal and sooth it.   Follow-up with our office as needed.  Please call and ask to speak with a nurse if you develop questions or concerns.

## 2020-09-02 NOTE — Progress Notes (Signed)
Patient ID: Joshua Lee, male   DOB: Dec 09, 1965, 55 y.o.   MRN: [id number]  Chief Complaint  Patient presents with   New Patient (Initial Visit)    Pilonidal cyst     HPI Joshua Lee is a 55 y.o. male.   He was referred by his primary care provider, Dr. Olevia Perches, for evaluation of a pilonidal cyst.  Mr. Sult states that about 4 to 5 months ago, he noticed some itching around his lower buttocks.  He says that it comes and goes.  He says that he has scratched it hard enough at times that it has bled.  Prior to 4 to 5 months ago, he had never had anything similar.  He states that no one in his family has ever had a similar episode.  He has never had any abscess or any drainage from the area.  He denies any fevers or chills.  His primary complaint is itching.   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   Procedure: TOTAL HIP ARTHROPLASTY ANTERIOR APPROACH;  Surgeon: Kennedy Bucker, MD;  Location: [facility] ORS;  Service: Orthopedics;  Laterality: Right;    Family History  Problem Relation Age of Onset   Diabetes Maternal Uncle     Social History Social History   Tobacco Use   Smoking status: Never   Smokeless tobacco: Current    Types: Snuff  Vaping Use   Vaping Use: Never used  Substance Use Topics   Alcohol use: Not Currently    Comment: Occasionally drinks beer   Drug use: No    Allergies  Allergen Reactions   Penicillins Rash    Has patient had a PCN reaction causing immediate rash, facial/tongue/throat swelling, SOB or lightheadedness with hypotension: unknown Has patient had a PCN reaction causing  severe rash involving mucus membranes or skin necrosis: unknown Has patient had a PCN reaction that required hospitalization: unknown Has patient had a PCN reaction occurring within the last 10 years: no If all of the above answers are "NO", then may proceed with Cephalosporin use.     Current Outpatient Medications  Medication Sig Dispense Refill   acetaminophen (TYLENOL) 650 MG CR tablet Take 650-1,950 mg by mouth every 8 (eight) hours as needed for pain.     amLODipine (NORVASC) 5 MG tablet Take 1 tablet (5 mg total) by mouth daily. 90 tablet 1   aspirin 81 MG tablet Take 81 mg by mouth daily.     atorvastatin (LIPITOR) 80 MG tablet Take 1 tablet (80 mg total) by mouth at bedtime. 90 tablet 1   benazepril (LOTENSIN) 40 MG tablet Take 1 tablet (40 mg total) by mouth daily. 90 tablet 1   calcium carbonate (TUMS EX) 750 MG chewable tablet Chew 2 tablets by mouth daily as needed for heartburn.      cetirizine (ZYRTEC) 10 MG tablet Take 10 mg by mouth daily as needed for allergies.      Coenzyme Q-10 100 MG capsule Take 100 mg by mouth daily.     CONTOUR NEXT TEST test  strip USE AS DIRECTED 100 strip 5   empagliflozin (JARDIANCE) 25 MG TABS tablet TAKE 1 TABLET(25 MG) BY MOUTH DAILY 90 tablet 1   Garlic 1000 MG CAPS Take 1,000 mg by mouth daily.     metFORMIN (GLUCOPHAGE) 500 MG tablet TAKE 2 TABLETS(1000 MG) BY MOUTH TWICE DAILY WITH A MEAL 360 tablet 1   metoprolol tartrate (LOPRESSOR) 100 MG tablet Take 1 tablet (100 mg total) by mouth 2 (two) times daily. 180 tablet 1   Potassium Citrate 15 MEQ (1620 MG) TBCR Take 15 mEq by mouth 2 (two) times daily. 2 tab QAM, 1 tab QPM  1   Semaglutide, 1 MG/DOSE, (OZEMPIC, 1 MG/DOSE,) 4 MG/3ML SOPN INJECT 1 MG INTO THE SKIN ONCE A WEEK 9 mL 1   tamsulosin (FLOMAX) 0.4 MG CAPS capsule TAKE 1 CAPSULE(0.4 MG) BY MOUTH DAILY 90 capsule 1   tolnaftate (TINACTIN) 1 % spray Apply 1 application topically as needed.     VASCEPA 1 g capsule Take 2 capsules (2 g  total) by mouth 2 (two) times daily. 360 capsule 1   No current facility-administered medications for this visit.    Review of Systems Review of Systems  Skin:        Pruritus  All other systems reviewed and are negative. Or as per the history of present illness.  Blood pressure 135/77, pulse 90, temperature 97.7 F (36.5 C), height 5\' 8"  (1.727 m), weight 247 lb 6.4 oz (112.2 kg), SpO2 99 %. Body mass index is 37.62 kg/m.  Physical Exam Physical Exam Exam conducted with a chaperone present.  Constitutional:      General: He is not in acute distress.    Appearance: Normal appearance. He is obese.  HENT:     Head: Normocephalic and atraumatic.     Nose:     Comments: Covered with a mask    Mouth/Throat:     Comments: Covered with a mask Eyes:     General: No scleral icterus.       Right eye: No discharge.        Left eye: No discharge.     Comments: Wearing glasses  Neck:     Comments: No palpable cervical or supraclavicular lymphadenopathy.  The trachea is midline.  No thyromegaly or dominant thyroid masses appreciated.  The gland moves freely with deglutition. Cardiovascular:     Rate and Rhythm: Normal rate and regular rhythm.     Pulses: Normal pulses.  Pulmonary:     Effort: Pulmonary effort is normal.     Breath sounds: Normal breath sounds.  Abdominal:     General: Bowel sounds are normal.     Palpations: Abdomen is soft.     Comments: Protuberant, consistent with his level of obesity.  Genitourinary:      Comments: There are areas of excoriated skin on his buttocks, consistent with his history of itching.  The skin does appear fairly dry.  In the natal cleft itself, there are no pits or sinuses visualized.  There is no fluctuance to suggest a cyst or abscess in the area. Musculoskeletal:        General: No deformity or signs of injury.     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  Skin:    General: Skin is  warm and dry.  Neurological:     General: No focal deficit present.     Mental Status: He is alert and oriented to person,  place, and time.  Psychiatric:        Mood and Affect: Mood normal.        Behavior: Behavior normal.    Data Reviewed I went through the electronic medical record in an attempt to identify where the diagnosis of pilonidal cyst was made.  I did not see anything in Dr. Henriette Combs recent clinic notes aside from the referral to our office.  The GU exam was deferred at the clinic visit on August 28, 2020.  There are chronic care management notes related to the patient's type 2 diabetes.  I also reviewed a urology note from Jun 13, 2020 in regards to nephrolithiasis treatment.  Again, there is no mention of pilonidal cyst in this evaluation.  Assessment This is a 55 year old man who was referred for possible pilonidal cyst.  On physical examination, I do not see any evidence of a cyst, sinus, or pit in his natal cleft.  He does have skin excoriation and dry skin on his buttocks.  Plan No surgical intervention is indicated.  I have recommended that he apply A&E ointment or Desitin to the areas of skin excoriation to help with healing.  I will see him on an as-needed basis.    Duanne Guess 09/02/2020, 1:35 PM

## 2020-09-26 ENCOUNTER — Telehealth: Payer: Self-pay

## 2020-09-26 NOTE — Telephone Encounter (Signed)
  Care Management   Follow Up Note   09/26/2020 Name: Joshua Lee MRN: [id number] DOB: 07-12-1965   Referred by: [name], DO Reason for referral : Care Coordination (RNCM: Follow up for Chronic Disease Management and Care Coordination Needs )   An unsuccessful telephone outreach was attempted today. The patient was referred to the case management team for assistance with care management and care coordination.   Follow Up Plan: A HIPPA compliant phone message was left for the patient providing contact information and requesting a return call.   Alto Denver RN, MSN, CCM Community Care Coordinator [place]  [facility] Mobile: [phone]

## 2020-10-03 NOTE — Telephone Encounter (Signed)
Pt has been rescheduled. 

## 2020-10-07 ENCOUNTER — Other Ambulatory Visit: Payer: Self-pay | Admitting: Family Medicine

## 2020-10-07 DIAGNOSIS — I10 Essential (primary) hypertension: Secondary | ICD-10-CM

## 2020-10-27 ENCOUNTER — Encounter: Payer: Self-pay | Admitting: General Surgery

## 2020-11-07 ENCOUNTER — Ambulatory Visit: Payer: Self-pay

## 2020-11-07 ENCOUNTER — Telehealth: Payer: BLUE CROSS/BLUE SHIELD | Admitting: General Practice

## 2020-11-07 DIAGNOSIS — E1122 Type 2 diabetes mellitus with diabetic chronic kidney disease: Secondary | ICD-10-CM

## 2020-11-07 DIAGNOSIS — E782 Mixed hyperlipidemia: Secondary | ICD-10-CM

## 2020-11-07 DIAGNOSIS — N183 Chronic kidney disease, stage 3 unspecified: Secondary | ICD-10-CM

## 2020-11-07 DIAGNOSIS — I10 Essential (primary) hypertension: Secondary | ICD-10-CM

## 2020-11-07 NOTE — Patient Instructions (Signed)
Visit Information   Goals Addressed             This Visit's Progress    RNCM: Monitor and Manage My Blood Sugar-Diabetes Type 2       Timeframe:  Long-Range Goal Priority:  High Start Date:     07-18-2021                        Expected End Date:       08-16-2021                Follow Up Date 01/07/2021    - check blood sugar at prescribed times - check blood sugar before and after exercise - check blood sugar if I feel it is too high or too low - enter blood sugar readings and medication or insulin into daily log - take the blood sugar log to all doctor visits - take the blood sugar meter to all doctor visits    Why is this important?   Checking your blood sugar at home helps to keep it from getting very high or very low.  Writing the results in a diary or log helps the doctor know how to care for you.  Your blood sugar log should have the time, date and the results.  Also, write down the amount of insulin or other medicine that you take.  Other information, like what you ate, exercise done and how you were feeling, will also be helpful.     Notes: 07-18-2020: The last time the patient took his blood sugar at home was 06-14-2020 with a reading of 99.  The patient states he has gotten slack. Education on the need to check blood sugars on a consistent bases and record. The patient denies any lows and endorses that he will start back checking his blood sugars. Discussed fasting of <130 and post prandial of <180. 08-27-2020: The patient was taking his blood sugars up until recently when he had COVID. Education on getting back to checking his blood sugars. Sees the pcp on 08-28-2020. 11-07-2020: The patient states he has not been checking his blood sugars regularly. Can't remember the last time he took it. States it was around 100. Gave several readings for September and range was 96 to 129. Education on checking blood sugars more frequently.      COMPLETED: RNCM: Post COVID Assessment        08-16-2020 the patient went to the walk in clinic for sore throat and sinus drainage. The patient tested positive for COVID 19. The patient took a z-pack and tessalon perles. The patient states that he is feeling much better now. He is out of quarantine now. Sees the pcp on 08-28-2020.11-07-2020: The patient is doing well. Denies any acute findings. Closing this goal.  Patient goals:  -report any new sx and sx of COVID 19 -practice good hand hygiene -wear mask when outside of home -stay out of crowds -follow CDC guidelines -call office for changes, questions, or concerns      RNCM: Set My Target A1C-Diabetes Type 2       Timeframe:  Short-Term Goal Priority:  High Start Date:            07-18-2020                 Expected End Date:        01-16-2021               Follow  Up Date 01/07/2021    - set target A1C    Why is this important?   Your target A1C is decided together by you and your doctor.  It is based on several things like your age and other health issues.    Notes: Last hemoglobin A1C was 6.6. Discussed the goal of keeping A1C of less than 7.0. 11-07-2020: The patient will see pcp in February and have additional blood work.         The patient verbalized understanding of instructions, educational materials, and care plan provided today and declined offer to receive copy of patient instructions, educational materials, and care plan.   Telephone follow up appointment with care management team member scheduled for: 01-07-2021 at 145 pm  Alto Denver RN, MSN, CCM Community Care Coordinator [place]  [facility] Mobile: [phone]

## 2020-11-07 NOTE — Chronic Care Management (AMB) (Signed)
Care Management    RN Visit Note  11/07/2020 Name: Joshua Lee MRN: [id number] DOB: [birth year]  Subjective: Joshua Lee is a 55 y.o. year old male who is a primary care patient of [name], DO. The care management team was consulted for assistance with disease management and care coordination needs.    Engaged with patient by telephone for follow up visit in response to provider referral for case management and/or care coordination services.   Consent to Services:   Mr. Petrey was given information about Care Management services today including:  Care Management services includes personalized support from designated clinical staff supervised by his physician, including individualized plan of care and coordination with other care providers 24/7 contact phone numbers for assistance for urgent and routine care needs. The patient may stop case management services at any time by phone call to the office staff.  Patient agreed to services and consent obtained.   Assessment: Review of patient past medical history, allergies, medications, health status, including review of consultants reports, laboratory and other test data, was performed as part of comprehensive evaluation and provision of chronic care management services.   SDOH (Social Determinants of Health) assessments and interventions performed:    Care Plan  Allergies  Allergen Reactions   Penicillins Rash    Has patient had a PCN reaction causing immediate rash, facial/tongue/throat swelling, SOB or lightheadedness with hypotension: unknown Has patient had a PCN reaction causing severe rash involving mucus membranes or skin necrosis: unknown Has patient had a PCN reaction that required hospitalization: unknown Has patient had a PCN reaction occurring within the last 10 years: no If all of the above answers are "NO", then may proceed with Cephalosporin use.     Outpatient Encounter Medications as of 11/07/2020   Medication Sig   acetaminophen (TYLENOL) 650 MG CR tablet Take 650-1,950 mg by mouth every 8 (eight) hours as needed for pain.   amLODipine (NORVASC) 5 MG tablet Take 1 tablet (5 mg total) by mouth daily.   aspirin 81 MG tablet Take 81 mg by mouth daily.   atorvastatin (LIPITOR) 80 MG tablet Take 1 tablet (80 mg total) by mouth at bedtime.   benazepril (LOTENSIN) 40 MG tablet Take 1 tablet (40 mg total) by mouth daily.   calcium carbonate (TUMS EX) 750 MG chewable tablet Chew 2 tablets by mouth daily as needed for heartburn.    cetirizine (ZYRTEC) 10 MG tablet Take 10 mg by mouth daily as needed for allergies.    Coenzyme Q-10 100 MG capsule Take 100 mg by mouth daily.   CONTOUR NEXT TEST test strip USE AS DIRECTED   Garlic 7654 MG CAPS Take 1,000 mg by mouth daily.   JARDIANCE 25 MG TABS tablet TAKE 1 TABLET(25 MG) BY MOUTH DAILY   metFORMIN (GLUCOPHAGE) 500 MG tablet TAKE 2 TABLETS(1000 MG) BY MOUTH TWICE DAILY WITH A MEAL   metoprolol tartrate (LOPRESSOR) 100 MG tablet Take 1 tablet (100 mg total) by mouth 2 (two) times daily.   Potassium Citrate 15 MEQ (1620 MG) TBCR Take 15 mEq by mouth 2 (two) times daily. 2 tab QAM, 1 tab QPM   Semaglutide, 1 MG/DOSE, (OZEMPIC, 1 MG/DOSE,) 4 MG/3ML SOPN INJECT 1 MG INTO THE SKIN ONCE A WEEK   tamsulosin (FLOMAX) 0.4 MG CAPS capsule TAKE 1 CAPSULE(0.4 MG) BY MOUTH DAILY   tolnaftate (TINACTIN) 1 % spray Apply 1 application topically as needed.   VASCEPA 1 g capsule Take 2 capsules (  2 g total) by mouth 2 (two) times daily.   No facility-administered encounter medications on file as of 11/07/2020.    Patient Active Problem List   Diagnosis Date Noted   Type 2 diabetes mellitus with stage 3 chronic kidney disease, without long-term current use of insulin ([place]) 03/06/2019   Morbid obesity ([place]) 11/10/2017   CKD (chronic kidney disease), stage II 03/16/2016   Plantar fasciitis 04/17/2015   Osteoarthritis of both feet 03/05/2015   Nephrolithiasis  12/02/2014   Hypertension 07/02/2014   Hyperlipidemia 07/02/2014   Obesity, diabetes, and hypertension syndrome ([place]) 07/02/2014   Difficult airway for intubation 05/09/2014    Conditions to be addressed/monitored: HTN, HLD, and DMII  Care Plan : RNCM: Diabetes Type 2 (Adult)  Updates made by Vanita Ingles, RN since 11/07/2020 12:00 AM  Completed 11/07/2020   Problem: RNCM: Glycemic Management (Diabetes, Type 2) Resolved 11/07/2020  Priority: Medium     Long-Range Goal: RNCM: Glycemic Management Optimized Completed 11/07/2020  Start Date: 03/12/2020  Expected End Date: 07/13/2021  Recent Progress: On track  Priority: Medium  Note:   Objective: resolving, duplicate goal Lab Results  Component Value Date   HGBA1C 6.6 02/28/2020   Lab Results  Component Value Date   CREATININE 0.96 06/10/2020   CREATININE 0.86 02/28/2020   CREATININE 1.10 09/06/2019   No results found for: EGFR Current Barriers:  Knowledge Deficits related to basic Diabetes pathophysiology and self care/management Knowledge Deficits related to medications used for management of diabetes Limited Social Support Lacks social connections Does not contact provider office for questions/concerns Case Manager Clinical Goal(s):  patient will demonstrate improved adherence to prescribed treatment plan for diabetes self care/management as evidenced by: daily monitoring and recording of CBG  adherence to ADA/ carb modified diet adherence to prescribed medication regimen contacting provider for new or worsened symptoms or questions Interventions:  Collaboration with [name], DO regarding development and update of comprehensive plan of care as evidenced by provider attestation and co-signature Inter-disciplinary care team collaboration (see longitudinal plan of care) Provided education to patient about basic DM disease process. 05-14-2020: Review of the patients DM management and the patient states that he is  doing well. He states his blood sugars are around 113 to 130.  The patient states he has not experienced any lows at this time. 07-18-2020: The patient has not been checking his blood sugars. Last reading at home was 06/14/2020 and it was 99. Discussed the need to check blood sugars on a consistent basis and write the numbers down. The patient states he has gotten slack but states he will start back recording his readings. Review of upcoming appointment in August with the pcp and ask the patient to bring readings in when he comes to see the pcp. Discussed the goal of <130 fasting and <180 post prandial. The patient verbalized understanding. 08-27-2020: The patient had been checking his blood sugars better until recently when he had COVID 19. He is recovered well and will start back checking his blood sugars. Discussed the importance of checking blood sugars regularly especially when he is sick and not feeling well. The patient denies any lows. Will continue to monitor for changes.  Reviewed medications with patient and discussed importance of medication adherence. 08-27-2020: The patient is compliant with medications  Discussed plans with patient for ongoing care management follow up and provided patient with direct contact information for care management team Provided patient with written educational materials related to hypo and hyperglycemia and  importance of correct treatment. 08-27-2020: Review of the sx and sx of hypo and hyperglycemia. The patient denies any sx or sx. Education on monitoring for changes and if he feels differently to take his blood sugars. Education and support given.  Reviewed scheduled/upcoming provider appointments including: 08-28-2020 at 10 am Advised patient, providing education and rationale, to check cbg as directed  and record, calling pcp for findings outside established parameters.  08-27-2020: Education and support given on checking blood sugars daily and when feeling different. The  patient denies any issues with his DM.  Review of patient status, including review of consultants reports, relevant laboratory and other test results, and medications completed. Patient Goals: - barriers to adherence to treatment plan identified - blood glucose monitoring encouraged - blood glucose readings reviewed - resources required to improve adherence to care identified - self-awareness of signs/symptoms of hypo or hyperglycemia encouraged - use of blood glucose monitoring log promoted Self-Care Activities - Self administers oral medications as prescribed Self administers insulin as prescribed Attends all scheduled provider appointments Checks blood sugars as prescribed and utilize hyper and hypoglycemia protocol as needed Adheres to prescribed ADA/carb modified Follow Up Plan: Telephone follow up appointment with care management team member scheduled for: 09-26-2020 at 1 pm    Care Plan : RNCM: HLD Management  Updates made by Vanita Ingles, RN since 11/07/2020 12:00 AM  Completed 11/07/2020   Problem: RNCM: HLD Management Resolved 11/07/2020  Priority: Medium     Long-Range Goal: RNCM: HLD Management Completed 11/07/2020  Start Date: 03/12/2020  Expected End Date: 05/14/2021  Recent Progress: On track  Priority: Medium  Note:   Current Barriers: Resolving, duplicate goal  Poorly controlled hyperlipidemia, complicated by DM, smoker Current antihyperlipidemic regimen: Lipitor 80 mg Most recent lipid panel:     Component Value Date/Time   CHOL 98 (L) 02/28/2020 0945   CHOL 108 08/29/2018 1045   TRIG 173 (H) 02/28/2020 0945   TRIG 221 (H) 08/29/2018 1045   HDL 25 (L) 02/28/2020 0945   CHOLHDL 6.5 (H) 02/09/2017 1002   VLDL 44 (H) 08/29/2018 1045   [place] 44 02/28/2020 0945   ASCVD risk enhancing conditions: age 36, DM, HTN, CKD, current smoker Unable to independently HLD Does not adhere to provider recommendations re: heart healthy/ADA diet, checking blood sugars as  directed, smoker Does not attend all scheduled provider appointments Lacks social connections Does not maintain contact with provider office Does not contact provider office for questions/concerns RN Care Manager Clinical Goal(s):  patient will work with RN Care Manager, providers, and care team towards execution of optimized self-health management plan patient will verbalize understanding of plan for effective management of HLD  patient will work with [employer], CCM team, and pcp  to address needs related to management of HLD patient will take all medications exactly as prescribed and will call provider for medication related questions patient will attend all scheduled medical appointments: 08-28-2020 at 10 am  patient will demonstrate improved health management independence the patient will demonstrate ongoing self health care management ability Interventions: Collaboration with [name], DO regarding development and update of comprehensive plan of care as evidenced by provider attestation and co-signature Inter-disciplinary care team collaboration (see longitudinal plan of care) Medication review performed; medication list updated in electronic medical record.  Inter-disciplinary care team collaboration (see longitudinal plan of care) Referred to pharmacy team for assistance with HLD medication management Evaluation of current treatment plan related to HLD and patient's adherence to plan as  established by provider. 08-27-2020: The patient has been eating some fried foods since getting over [place]. Education on following a heart healthy/ADA diet. Reviewed reading labels and healthy food options. The patient verbalized understanding.  Advised patient to call the office for changes in condition or questions, to keep upcoming appointment with pcp Provided education to patient re: heart healthy/ADA diet, and keeping watch on blood sugars Reviewed medications with patient and discussed compliance   Provided patient with heart healthy/ADA diet educational materials related to management of HLD with diet and healthy lifestyle Reviewed scheduled/upcoming provider appointments including: 08-28-2020 at 10 am Discussed plans with patient for ongoing care management follow up and provided patient with direct contact information for care management team Patient Goals/Self-Care Activities: - call for medicine refill 2 or 3 days before it runs out - call if I am sick and can't take my medicine - keep a list of all the medicines I take; vitamins and herbals too - learn to read medicine labels - use a pillbox to sort medicine - use an alarm clock or phone to remind me to take my medicine - change to whole grain breads, cereal, pasta - drink 6 to 8 glasses of water each day - eat 3 to 5 servings of fruits and vegetables each day - eat 5 or 6 small meals each day - eat fish at least once per week - fill half the plate with nonstarchy vegetables - limit fast food meals to no more than 1 per week - prepare main meal at home 3 to 5 days each week - read food labels for fat, fiber, carbohydrates and portion size - reduce red meat to 2 to 3 times a week - be open to making changes - I can manage, know and watch for signs of a heart attack - if I have chest pain, call for help - learn about small changes that will make a big difference - learn my personal risk factors  Follow Up Plan: Telephone follow up appointment with care management team member scheduled for: 09-26-2020 at 1 pm      Care Plan : RNCM: General Plan of Care (Adult) for Chronic Disease Management and Care Coordination needs  Updates made by Vanita Ingles, RN since 11/07/2020 12:00 AM     Problem: RNCM: Development of plan of care for Chronic Disease Management and Care Coordination Needs (HTN, HLD, DM)   Priority: High     Long-Range Goal: RNCM: Effective management  of plan of care for Chronic Disease Management and Care  Coordination Needs (HTN, HLD, DM)   Start Date: 11/07/2020  Expected End Date: 11/07/2021  Priority: High  Note:   Current Barriers:  Knowledge Deficits related to plan of care for management of HTN, HLD, and DMII  Chronic Disease Management support and education needs related to HTN, HLD, and DMII Lacks caregiver support.   RNCM Clinical Goal(s):  Patient will verbalize understanding of plan for management of HTN, HLD, and DMII  verbalize basic understanding of HTN, HLD, and DMII disease process and self health management plan   take all medications exactly as prescribed and will call provider for medication related questions demonstrate understanding of rationale for each prescribed medication   attend all scheduled medical appointments: 03-02-2021 demonstrate improved and ongoing adherence to prescribed treatment plan for HTN, HLD, and DMII as evidenced by daily monitoring and recording of CBG  adherence to ADA/ carb modified diet adherence to prescribed medication regimen contacting provider for new  or worsened symptoms or questions   demonstrate improved and ongoing health management independence   demonstrate a decrease in HTN, HLD, and DMII exacerbations   demonstrate ongoing self health care management ability effective management of chronic diseases  through collaboration with RN Care manager, provider, and care team.   Interventions: 1:1 collaboration with primary care provider regarding development and update of comprehensive plan of care as evidenced by provider attestation and co-signature Inter-disciplinary care team collaboration (see longitudinal plan of care) Evaluation of current treatment plan related to  self management and patient's adherence to plan as established by provider   SDOH Barriers (Status: Goal on track: YES.)  Patient interviewed and SDOH assessment performed        Patient interviewed and appropriate assessments performed Provided patient with  information about resources for [facility] and care guides to assist with any new SDOH needs Discussed plans with patient for ongoing care management follow up and provided patient with direct contact information for care management team Advised patient to call the office for changes in [place], questions, and concerns    Diabetes:  (Status: Goal on track: YES.) Lab Results  Component Value Date   HGBA1C 6.5 08/28/2020  Assessed patient's understanding of A1c goal: <7% Provided education to patient about basic DM disease process; Reviewed medications with patient and discussed importance of medication adherence. 11-07-2020: The patient is compliant with medications        Reviewed prescribed diet with patient heart healthy/ADA diet ; Counseled on importance of regular laboratory monitoring as prescribed;        Discussed plans with patient for ongoing care management follow up and provided patient with direct contact information for care management team;      Provided patient with written educational materials related to hypo and hyperglycemia and importance of correct treatment.  11-07-2020: The patient denies any issues with hypo and hyperglycemia. States that the lowest he has seen is 96.  Denies any issues with hypoglycemia at this time;       Reviewed scheduled/upcoming provider appointments including: 03-02-2021 at 1020 am;         Advised patient, providing education and rationale, to check cbg daily and record. 11-07-2020: States that he has not been taking his blood sugar reading.  The patient states that his blood sugars have been 96 to 129 that was back in September. Education on checking blood sugars at least daily or several times a week.       call provider for findings outside established parameters;       Review of patient status, including review of consultants reports, relevant laboratory and other test results, and medications completed;       Screening for signs and symptoms  of depression related to chronic disease state;        Assessed social determinant of health barriers;         Hyperlipidemia:  (Status: Goal on track: NO.) Lab Results  Component Value Date   CHOL 106 08/28/2020   HDL 27 (L) 08/28/2020   LDLCALC 44 08/28/2020   TRIG 220 (H) 08/28/2020   CHOLHDL 6.5 (H) 02/09/2017     Medication review performed; medication list updated in electronic medical record.  Provider established cholesterol goals reviewed; Counseled on importance of regular laboratory monitoring as prescribed; Provided HLD educational materials; Reviewed role and benefits of statin for ASCVD risk reduction; Discussed strategies to manage statin-induced myalgias; Reviewed importance of limiting foods high in cholesterol; Reviewed exercise goals  and target of 150 minutes per week; Screening for signs and symptoms of depression related to chronic disease state;  Assessed social determinant of health barriers;   Hypertension: (Status: Goal on track: YES.) Last practice recorded BP readings:  BP Readings from Last 3 Encounters:  09/02/20 135/77  08/28/20 102/64  06/10/20 104/65  Most recent eGFR/CrCl:  Lab Results  Component Value Date   EGFR 104 08/28/2020    No components found for: CRCL  Evaluation of current treatment plan related to hypertension self management and patient's adherence to plan as established by provider;   Provided education to patient re: stroke prevention, s/s of heart attack and stroke; Reviewed prescribed diet heart healthy/ADA  Reviewed medications with patient and discussed importance of compliance;  Counseled on adverse effects of illicit drug and excessive alcohol use in patients with high blood pressure;  Discussed plans with patient for ongoing care management follow up and provided patient with direct contact information for care management team; Advised patient, providing education and rationale, to monitor blood pressure daily and  record, calling PCP for findings outside established parameters;  Reviewed scheduled/upcoming provider appointments including:  Provided education on prescribed diet heart healthy/ADA;  Discussed complications of poorly controlled blood pressure such as heart disease, stroke, circulatory complications, vision complications, kidney impairment, sexual dysfunction;   Patient Goals/Self-Care Activities: Patient will self administer medications as prescribed as evidenced by self report/primary caregiver report  Patient will attend all scheduled provider appointments as evidenced by clinician review of documented attendance to scheduled appointments and patient/caregiver report Patient will call pharmacy for medication refills as evidenced by patient report and review of pharmacy fill history as appropriate Patient will attend church or other social activities as evidenced by patient report Patient will continue to perform ADL's independently as evidenced by patient/caregiver report Patient will continue to perform IADL's independently as evidenced by patient/caregiver report Patient will call provider office for new concerns or questions as evidenced by review of documented incoming telephone call notes and patient report Patient will work with BSW to address care coordination needs and will continue to work with the clinical team to address health care and disease management related needs as evidenced by documented adherence to scheduled care management/care coordination appointments - check blood pressure 3 times per week - check blood pressure daily - check blood pressure weekly - choose a place to take my blood pressure (home, clinic or office, retail store) - learn about high blood pressure - keep a blood pressure log - take blood pressure log to all doctor appointments - call doctor for signs and symptoms of high blood pressure - develop an action plan for high blood pressure - keep all  doctor appointments - take medications for blood pressure exactly as prescribed - begin an exercise program - report new symptoms to your doctor - eat more whole grains, fruits and vegetables, lean meats and healthy fats - call for medicine refill 2 or 3 days before it runs out - take all medications exactly as prescribed - call doctor with any symptoms you believe are related to your medicine - call doctor when you experience any new symptoms - go to all doctor appointments as scheduled - adhere to prescribed diet: Heart Healthy/ADA diet       Plan: Telephone follow up appointment with care management team member scheduled for:  01-07-2021 at 145 pm  Noreene Larsson RN, MSN, [facility] Mobile: [phone]

## 2020-11-21 ENCOUNTER — Other Ambulatory Visit: Payer: Self-pay | Admitting: Family Medicine

## 2020-11-21 NOTE — Telephone Encounter (Signed)
Requested Prescriptions  Pending Prescriptions Disp Refills  . CONTOUR NEXT TEST test strip [Pharmacy Med Name: CONTOUR NEXT TEST STRIPS 100S] 100 strip 3    Sig: USE AS DIRECTED     Endocrinology: Diabetes - Testing Supplies Passed - 11/21/2020 10:59 AM      Passed - Valid encounter within last 12 months    Recent Outpatient Visits          2 months ago Routine general medical examination at a health care facility   [facility], [name], DO   5 months ago Abdominal pain, unspecified abdominal location   [facility] Vigg, [place], MD   8 months ago Type 2 diabetes mellitus with stage 3 chronic kidney disease, without long-term current use of insulin, unspecified whether stage 3a or 3b CKD (HCC)   [facility] Johnson, [name], DO   11 months ago Type 2 diabetes 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

## 2021-01-07 ENCOUNTER — Telehealth: Payer: Self-pay

## 2021-01-07 ENCOUNTER — Telehealth: Payer: BLUE CROSS/BLUE SHIELD

## 2021-01-07 ENCOUNTER — Ambulatory Visit: Payer: Self-pay

## 2021-01-07 DIAGNOSIS — I10 Essential (primary) hypertension: Secondary | ICD-10-CM

## 2021-01-07 DIAGNOSIS — N183 Chronic kidney disease, stage 3 unspecified: Secondary | ICD-10-CM

## 2021-01-07 DIAGNOSIS — E782 Mixed hyperlipidemia: Secondary | ICD-10-CM

## 2021-01-07 NOTE — Chronic Care Management (AMB) (Signed)
Care Management    RN Visit Note  01/07/2021 Name: Joshua Lee MRN: [id number] DOB: Jun 30, 1965  Subjective: Joshua Lee is a 55 y.o. year old male who is a primary care patient of [name], DO. The care management team was consulted for assistance with disease management and care coordination needs.    Engaged with patient by telephone for follow up visit in response to provider referral for case management and/or care coordination services.   Consent to Services:   Mr. Gerrard was given information about Care Management services today including:  Care Management services includes personalized support from designated clinical staff supervised by his physician, including individualized plan of care and coordination with other care providers 24/7 contact phone numbers for assistance for urgent and routine care needs. The patient may stop case management services at any time by phone call to the office staff.  Patient agreed to services and consent obtained.   Assessment: Review of patient past medical history, allergies, medications, health status, including review of consultants reports, laboratory and other test data, was performed as part of comprehensive evaluation and provision of chronic care management services.   SDOH (Social Determinants of Health) assessments and interventions performed:    Care Plan  Allergies  Allergen Reactions   Penicillins Rash    Has patient had a PCN reaction causing immediate rash, facial/tongue/throat swelling, SOB or lightheadedness with hypotension: unknown Has patient had a PCN reaction causing severe rash involving mucus membranes or skin necrosis: unknown Has patient had a PCN reaction that required hospitalization: unknown Has patient had a PCN reaction occurring within the last 10 years: no If all of the above answers are "NO", then may proceed with Cephalosporin use.     Outpatient Encounter Medications as of 01/07/2021   Medication Sig   acetaminophen (TYLENOL) 650 MG CR tablet Take 650-1,950 mg by mouth every 8 (eight) hours as needed for pain.   amLODipine (NORVASC) 5 MG tablet Take 1 tablet (5 mg total) by mouth daily.   aspirin 81 MG tablet Take 81 mg by mouth daily.   atorvastatin (LIPITOR) 80 MG tablet Take 1 tablet (80 mg total) by mouth at bedtime.   benazepril (LOTENSIN) 40 MG tablet Take 1 tablet (40 mg total) by mouth daily.   calcium carbonate (TUMS EX) 750 MG chewable tablet Chew 2 tablets by mouth daily as needed for heartburn.    cetirizine (ZYRTEC) 10 MG tablet Take 10 mg by mouth daily as needed for allergies.    Coenzyme Q-10 100 MG capsule Take 100 mg by mouth daily.   CONTOUR NEXT TEST test strip USE AS DIRECTED   Garlic 8264 MG CAPS Take 1,000 mg by mouth daily.   JARDIANCE 25 MG TABS tablet TAKE 1 TABLET(25 MG) BY MOUTH DAILY   metFORMIN (GLUCOPHAGE) 500 MG tablet TAKE 2 TABLETS(1000 MG) BY MOUTH TWICE DAILY WITH A MEAL   metoprolol tartrate (LOPRESSOR) 100 MG tablet Take 1 tablet (100 mg total) by mouth 2 (two) times daily.   Potassium Citrate 15 MEQ (1620 MG) TBCR Take 15 mEq by mouth 2 (two) times daily. 2 tab QAM, 1 tab QPM   Semaglutide, 1 MG/DOSE, (OZEMPIC, 1 MG/DOSE,) 4 MG/3ML SOPN INJECT 1 MG INTO THE SKIN ONCE A WEEK   tamsulosin (FLOMAX) 0.4 MG CAPS capsule TAKE 1 CAPSULE(0.4 MG) BY MOUTH DAILY   tolnaftate (TINACTIN) 1 % spray Apply 1 application topically as needed.   VASCEPA 1 g capsule Take 2 capsules (  2 g total) by mouth 2 (two) times daily.   No facility-administered encounter medications on file as of 01/07/2021.    Patient Active Problem List   Diagnosis Date Noted   Type 2 diabetes mellitus with stage 3 chronic kidney disease, without long-term current use of insulin ([place]) 03/06/2019   Morbid obesity ([place]) 11/10/2017   CKD (chronic kidney disease), stage II 03/16/2016   Plantar fasciitis 04/17/2015   Osteoarthritis of both feet 03/05/2015   Nephrolithiasis  12/02/2014   Hypertension 07/02/2014   Hyperlipidemia 07/02/2014   Obesity, diabetes, and hypertension syndrome ([place]) 07/02/2014   Difficult airway for intubation 05/09/2014    Conditions to be addressed/monitored: HTN, HLD, and DMII  Care Plan : RNCM: General Plan of Care (Adult) for Chronic Disease Management and Care Coordination needs  Updates made by Vanita Ingles, RN since 01/07/2021 12:00 AM     Problem: RNCM: Development of plan of care for Chronic Disease Management and Care Coordination Needs (HTN, HLD, DM)   Priority: High     Long-Range Goal: RNCM: Effective management  of plan of care for Chronic Disease Management and Care Coordination Needs (HTN, HLD, DM)   Start Date: 11/07/2020  Expected End Date: 11/07/2021  Priority: High  Note:   Current Barriers:  Knowledge Deficits related to plan of care for management of HTN, HLD, and DMII  Chronic Disease Management support and education needs related to HTN, HLD, and DMII Lacks caregiver support.   RNCM Clinical Goal(s):  Patient will verbalize understanding of plan for management of HTN, HLD, and DMII  verbalize basic understanding of HTN, HLD, and DMII disease process and self health management plan   take all medications exactly as prescribed and will call provider for medication related questions demonstrate understanding of rationale for each prescribed medication   attend all scheduled medical appointments: 03-02-2021 demonstrate improved and ongoing adherence to prescribed treatment plan for HTN, HLD, and DMII as evidenced by daily monitoring and recording of CBG  adherence to ADA/ carb modified diet adherence to prescribed medication regimen contacting provider for new or worsened symptoms or questions   demonstrate improved and ongoing health management independence   demonstrate a decrease in HTN, HLD, and DMII exacerbations   demonstrate ongoing self health care management ability effective management of  chronic diseases  through collaboration with RN Care manager, provider, and care team.   Interventions: 1:1 collaboration with primary care provider regarding development and update of comprehensive plan of care as evidenced by provider attestation and co-signature Inter-disciplinary care team collaboration (see longitudinal plan of care) Evaluation of current treatment plan related to  self management and patient's adherence to plan as established by provider   SDOH Barriers (Status: Goal on track: YES.)  Patient interviewed and SDOH assessment performed        Patient interviewed and appropriate assessments performed Provided patient with information about resources for [facility] and care guides to assist with any new SDOH needs Discussed plans with patient for ongoing care management follow up and provided patient with direct contact information for care management team Advised patient to call the office for changes in [place], questions, and concerns    Diabetes:  (Status: Goal on track: YES.) Lab Results  Component Value Date   HGBA1C 6.5 08/28/2020  Assessed patient's understanding of A1c goal: <7% Provided education to patient about basic DM disease process; Reviewed medications with patient and discussed importance of medication adherence. 01-07-2021: The patient is compliant with medications  Reviewed prescribed diet with patient heart healthy/ADA diet ; Counseled on importance of regular laboratory monitoring as prescribed;        Discussed plans with patient for ongoing care management follow up and provided patient with direct contact information for care management team;      Provided patient with written educational materials related to hypo and hyperglycemia and importance of correct treatment.  01-07-2021: The patient denies any issues with hypo and hyperglycemia. States that the lowest he has seen is 81.  Denies any issues with hypoglycemia at this time;        Reviewed scheduled/upcoming provider appointments including: 03-02-2021 at 1020 am;         Advised patient, providing education and rationale, to check cbg daily and record. 01-07-2021: States that he has not been taking his blood sugar reading consistently.  The patient states that his blood sugars have been 81 to 130 that was back the first of December. Education on checking blood sugars at least daily or several times a week.       call provider for findings outside established parameters;       Review of patient status, including review of consultants reports, relevant laboratory and other test results, and medications completed;       Screening for signs and symptoms of depression related to chronic disease state;        Assessed social determinant of health barriers;         Hyperlipidemia:  (Status: Goal on track: NO.) Lab Results  Component Value Date   CHOL 106 08/28/2020   HDL 27 (L) 08/28/2020   LDLCALC 44 08/28/2020   TRIG 220 (H) 08/28/2020   CHOLHDL 6.5 (H) 02/09/2017     Medication review performed; medication list updated in electronic medical record.  Provider established cholesterol goals reviewed. 01-07-2021: Education and support given. Review of heart healthy/ADA diet.  Counseled on importance of regular laboratory monitoring as prescribed; Provided HLD educational materials; Reviewed role and benefits of statin for ASCVD risk reduction; Discussed strategies to manage statin-induced myalgias; Reviewed importance of limiting foods high in cholesterol; Reviewed exercise goals and target of 150 minutes per week; Screening for signs and symptoms of depression related to chronic disease state;  Assessed social determinant of health barriers;   Hypertension: (Status: Goal on track: YES.) Last practice recorded BP readings:  BP Readings from Last 3 Encounters:  09/02/20 135/77  08/28/20 102/64  06/10/20 104/65  Most recent eGFR/CrCl:  Lab Results  Component Value  Date   EGFR 104 08/28/2020    No components found for: CRCL  Evaluation of current treatment plan related to hypertension self management and patient's adherence to plan as established by provider. 01-07-2021: The patient denies any concerns with HTN and heart health. The patient is concerned about his mother and expressed how she has a lot of things going on and he is worried about her. Reflective listening and support given. Review of resources and to be patient with his parent ;   Provided education to patient re: stroke prevention, s/s of heart attack and stroke; Reviewed prescribed diet heart healthy/ADA  Reviewed medications with patient and discussed importance of compliance;  Counseled on adverse effects of illicit drug and excessive alcohol use in patients with high blood pressure;  Discussed plans with patient for ongoing care management follow up and provided patient with direct contact information for care management team; Advised patient, providing education and rationale, to monitor blood pressure daily and record,  calling PCP for findings outside established parameters;  Reviewed scheduled/upcoming provider appointments including:  Provided education on prescribed diet heart healthy/ADA;  Discussed complications of poorly controlled blood pressure such as heart disease, stroke, circulatory complications, vision complications, kidney impairment, sexual dysfunction;   Patient Goals/Self-Care Activities: Patient will self administer medications as prescribed as evidenced by self report/primary caregiver report  Patient will attend all scheduled provider appointments as evidenced by clinician review of documented attendance to scheduled appointments and patient/caregiver report Patient will call pharmacy for medication refills as evidenced by patient report and review of pharmacy fill history as appropriate Patient will attend church or other social activities as evidenced by patient  report Patient will continue to perform ADL's independently as evidenced by patient/caregiver report Patient will continue to perform IADL's independently as evidenced by patient/caregiver report Patient will call provider office for new concerns or questions as evidenced by review of documented incoming telephone call notes and patient report Patient will work with BSW to address care coordination needs and will continue to work with the clinical team to address health care and disease management related needs as evidenced by documented adherence to scheduled care management/care coordination appointments - check blood pressure 3 times per week - check blood pressure daily - check blood pressure weekly - choose a place to take my blood pressure (home, clinic or office, retail store) - learn about high blood pressure - keep a blood pressure log - take blood pressure log to all doctor appointments - call doctor for signs and symptoms of high blood pressure - develop an action plan for high blood pressure - keep all doctor appointments - take medications for blood pressure exactly as prescribed - begin an exercise program - report new symptoms to your doctor - eat more whole grains, fruits and vegetables, lean meats and healthy fats - call for medicine refill 2 or 3 days before it runs out - take all medications exactly as prescribed - call doctor with any symptoms you believe are related to your medicine - call doctor when you experience any new symptoms - go to all doctor appointments as scheduled - adhere to prescribed diet: Heart Healthy/ADA diet       Plan: Telephone follow up appointment with care management team member scheduled for:  03-11-2021 and 230 pm  Noreene Larsson RN, MSN, [facility] Mobile: [phone]

## 2021-01-07 NOTE — Patient Instructions (Signed)
Visit Information  Thank you for taking time to visit with me today. Please don't hesitate to contact me if I can be of assistance to you before our next scheduled telephone appointment.  Following are the goals we discussed today:  RNCM Clinical Goal(s):  Patient will verbalize understanding of plan for management of HTN, HLD, and DMII  verbalize basic understanding of HTN, HLD, and DMII disease process and self health management plan   take all medications exactly as prescribed and will call provider for medication related questions demonstrate understanding of rationale for each prescribed medication   attend all scheduled medical appointments: 03-02-2021 demonstrate improved and ongoing adherence to prescribed treatment plan for HTN, HLD, and DMII as evidenced by daily monitoring and recording of CBG  adherence to ADA/ carb modified diet adherence to prescribed medication regimen contacting provider for new or worsened symptoms or questions   demonstrate improved and ongoing health management independence   demonstrate a decrease in HTN, HLD, and DMII exacerbations   demonstrate ongoing self health care management ability effective management of chronic diseases  through collaboration with RN Care manager, provider, and care team.    Interventions: 1:1 collaboration with primary care provider regarding development and update of comprehensive plan of care as evidenced by provider attestation and co-signature Inter-disciplinary care team collaboration (see longitudinal plan of care) Evaluation of current treatment plan related to  self management and patient's adherence to plan as established by provider     SDOH Barriers (Status: Goal on track: YES.)  Patient interviewed and SDOH assessment performed        Patient interviewed and appropriate assessments performed Provided patient with information about resources for [facility] and care guides to assist with any new SDOH  needs Discussed plans with patient for ongoing care management follow up and provided patient with direct contact information for care management team Advised patient to call the office for changes in [place], questions, and concerns       Diabetes:  (Status: Goal on track: YES.)      Lab Results  Component Value Date    HGBA1C 6.5 08/28/2020  Assessed patient's understanding of A1c goal: <7% Provided education to patient about basic DM disease process; Reviewed medications with patient and discussed importance of medication adherence. 01-07-2021: The patient is compliant with medications        Reviewed prescribed diet with patient heart healthy/ADA diet ; Counseled on importance of regular laboratory monitoring as prescribed;        Discussed plans with patient for ongoing care management follow up and provided patient with direct contact information for care management team;      Provided patient with written educational materials related to hypo and hyperglycemia and importance of correct treatment.  01-07-2021: The patient denies any issues with hypo and hyperglycemia. States that the lowest he has seen is 81.  Denies any issues with hypoglycemia at this time;       Reviewed scheduled/upcoming provider appointments including: 03-02-2021 at 1020 am;         Advised patient, providing education and rationale, to check cbg daily and record. 01-07-2021: States that he has not been taking his blood sugar reading consistently.  The patient states that his blood sugars have been 81 to 130 that was back the first of December. Education on checking blood sugars at least daily or several times a week.       call provider for findings outside established parameters;  Review of patient status, including review of consultants reports, relevant laboratory and other test results, and medications completed;       Screening for signs and symptoms of depression related to chronic disease state;         Assessed social determinant of health barriers;          Hyperlipidemia:  (Status: Goal on track: NO.)      Lab Results  Component Value Date    CHOL 106 08/28/2020    HDL 27 (L) 08/28/2020    LDLCALC 44 08/28/2020    TRIG 220 (H) 08/28/2020    CHOLHDL 6.5 (H) 02/09/2017      Medication review performed; medication list updated in electronic medical record.  Provider established cholesterol goals reviewed. 01-07-2021: Education and support given. Review of heart healthy/ADA diet.  Counseled on importance of regular laboratory monitoring as prescribed; Provided HLD educational materials; Reviewed role and benefits of statin for ASCVD risk reduction; Discussed strategies to manage statin-induced myalgias; Reviewed importance of limiting foods high in cholesterol; Reviewed exercise goals and target of 150 minutes per week; Screening for signs and symptoms of depression related to chronic disease state;  Assessed social determinant of health barriers;    Hypertension: (Status: Goal on track: YES.) Last practice recorded BP readings:     BP Readings from Last 3 Encounters:  09/02/20 135/77  08/28/20 102/64  06/10/20 104/65  Most recent eGFR/CrCl:       Lab Results  Component Value Date    EGFR 104 08/28/2020    No components found for: CRCL   Evaluation of current treatment plan related to hypertension self management and patient's adherence to plan as established by provider. 01-07-2021: The patient denies any concerns with HTN and heart health. The patient is concerned about his mother and expressed how she has a lot of things going on and he is worried about her. Reflective listening and support given. Review of resources and to be patient with his parent ;   Provided education to patient re: stroke prevention, s/s of heart attack and stroke; Reviewed prescribed diet heart healthy/ADA  Reviewed medications with patient and discussed importance of compliance;  Counseled on  adverse effects of illicit drug and excessive alcohol use in patients with high blood pressure;  Discussed plans with patient for ongoing care management follow up and provided patient with direct contact information for care management team; Advised patient, providing education and rationale, to monitor blood pressure daily and record, calling PCP for findings outside established parameters;  Reviewed scheduled/upcoming provider appointments including:  Provided education on prescribed diet heart healthy/ADA;  Discussed complications of poorly controlled blood pressure such as heart disease, stroke, circulatory complications, vision complications, kidney impairment, sexual dysfunction;    Patient Goals/Self-Care Activities: Patient will self administer medications as prescribed as evidenced by self report/primary caregiver report  Patient will attend all scheduled provider appointments as evidenced by clinician review of documented attendance to scheduled appointments and patient/caregiver report Patient will call pharmacy for medication refills as evidenced by patient report and review of pharmacy fill history as appropriate Patient will attend church or other social activities as evidenced by patient report Patient will continue to perform ADL's independently as evidenced by patient/caregiver report Patient will continue to perform IADL's independently as evidenced by patient/caregiver report Patient will call provider office for new concerns or questions as evidenced by review of documented incoming telephone call notes and patient report Patient will work with BSW to address care coordination  needs and will continue to work with the clinical team to address health care and disease management related needs as evidenced by documented adherence to scheduled care management/care coordination appointments - check blood pressure 3 times per week - check blood pressure daily - check blood pressure  weekly - choose a place to take my blood pressure (home, clinic or office, retail store) - learn about high blood pressure - keep a blood pressure log - take blood pressure log to all doctor appointments - call doctor for signs and symptoms of high blood pressure - develop an action plan for high blood pressure - keep all doctor appointments - take medications for blood pressure exactly as prescribed - begin an exercise program - report new symptoms to your doctor - eat more whole grains, fruits and vegetables, lean meats and healthy fats - call for medicine refill 2 or 3 days before it runs out - take all medications exactly as prescribed - call doctor with any symptoms you believe are related to your medicine - call doctor when you experience any new symptoms - go to all doctor appointments as scheduled - adhere to prescribed diet: Heart Healthy/ADA diet    Our next appointment is by telephone on 03-11-2021 at 230 pm  Please call the care guide team at [phone] if you need to cancel or reschedule your appointment.   If you are experiencing a Mental Health or [place] or need someone to talk to, please call the Suicide and Crisis Lifeline: 988 call the [place] National Suicide Prevention Lifeline: 203-295-9446 or TTY: 575-628-5806 TTY 517-557-9840) to talk to a trained counselor call 1-800-273-TALK (toll free, 24 hour hotline)   The patient verbalized understanding of instructions, educational materials, and care plan provided today and declined offer to receive copy of patient instructions, educational materials, and care plan.   Telephone follow up appointment with care management team member scheduled for: 03-11-2021 at 230 pm  Noreene Larsson RN, MSN, [facility] Mobile: [phone]

## 2021-01-07 NOTE — Telephone Encounter (Signed)
°  Care Management   Follow Up Note   01/07/2021 Name: Joshua Lee MRN: [id number] DOB: [birth year]   Referred by: [name], DO Reason for referral : Care Coordination (RNCM: Follow up for Chronic Disease Management and Care Coordination Needs)   The patient called back and did not leave a message. RNCM called the patient back and was able to complete the call. The call was completed. See new encounter.   Follow Up Plan: Telephone follow up appointment with care management team member scheduled for: 03-11-2021 at 230 pm  Alto Denver RN, MSN, CCM Community Care Coordinator [place]   [facility] Mobile: [phone]

## 2021-01-08 LAB — HM DIABETES EYE EXAM

## 2021-01-20 DIAGNOSIS — K6289 Other specified diseases of anus and rectum: Secondary | ICD-10-CM | POA: Diagnosis not present

## 2021-01-29 DIAGNOSIS — K645 Perianal venous thrombosis: Secondary | ICD-10-CM | POA: Diagnosis not present

## 2021-02-12 DIAGNOSIS — M9903 Segmental and somatic dysfunction of lumbar region: Secondary | ICD-10-CM | POA: Diagnosis not present

## 2021-02-12 DIAGNOSIS — M9905 Segmental and somatic dysfunction of pelvic region: Secondary | ICD-10-CM | POA: Diagnosis not present

## 2021-02-12 DIAGNOSIS — M5136 Other intervertebral disc degeneration, lumbar region: Secondary | ICD-10-CM | POA: Diagnosis not present

## 2021-02-12 DIAGNOSIS — M5416 Radiculopathy, lumbar region: Secondary | ICD-10-CM | POA: Diagnosis not present

## 2021-03-02 ENCOUNTER — Other Ambulatory Visit: Payer: Self-pay

## 2021-03-02 ENCOUNTER — Other Ambulatory Visit: Payer: Self-pay | Admitting: Family Medicine

## 2021-03-02 ENCOUNTER — Encounter: Payer: Self-pay | Admitting: Family Medicine

## 2021-03-02 ENCOUNTER — Ambulatory Visit (INDEPENDENT_AMBULATORY_CARE_PROVIDER_SITE_OTHER): Payer: 59 | Admitting: Family Medicine

## 2021-03-02 VITALS — BP 110/75 | HR 86 | Temp 97.5°F | Wt 243.0 lb

## 2021-03-02 DIAGNOSIS — I10 Essential (primary) hypertension: Secondary | ICD-10-CM | POA: Diagnosis not present

## 2021-03-02 DIAGNOSIS — N182 Chronic kidney disease, stage 2 (mild): Secondary | ICD-10-CM

## 2021-03-02 DIAGNOSIS — N183 Chronic kidney disease, stage 3 unspecified: Secondary | ICD-10-CM | POA: Diagnosis not present

## 2021-03-02 DIAGNOSIS — E1122 Type 2 diabetes mellitus with diabetic chronic kidney disease: Secondary | ICD-10-CM

## 2021-03-02 DIAGNOSIS — E782 Mixed hyperlipidemia: Secondary | ICD-10-CM | POA: Diagnosis not present

## 2021-03-02 LAB — BAYER DCA HB A1C WAIVED: HB A1C (BAYER DCA - WAIVED): 6.6 % — ABNORMAL HIGH (ref 4.8–5.6)

## 2021-03-02 MED ORDER — EMPAGLIFLOZIN 25 MG PO TABS: ORAL_TABLET | ORAL | 1 refills | Status: DC

## 2021-03-02 MED ORDER — ATORVASTATIN CALCIUM 80 MG PO TABS: 80.0000 mg | ORAL_TABLET | Freq: Every day | ORAL | 1 refills | Status: DC

## 2021-03-02 MED ORDER — METOPROLOL TARTRATE 100 MG PO TABS: 100.0000 mg | ORAL_TABLET | Freq: Two times a day (BID) | ORAL | 1 refills | Status: DC

## 2021-03-02 MED ORDER — OZEMPIC (1 MG/DOSE) 4 MG/3ML ~~LOC~~ SOPN: PEN_INJECTOR | SUBCUTANEOUS | 1 refills | Status: DC

## 2021-03-02 MED ORDER — VASCEPA 1 G PO CAPS: 2.0000 g | ORAL_CAPSULE | Freq: Two times a day (BID) | ORAL | 1 refills | Status: DC

## 2021-03-02 MED ORDER — BENAZEPRIL HCL 40 MG PO TABS: 40.0000 mg | ORAL_TABLET | Freq: Every day | ORAL | 1 refills | Status: DC

## 2021-03-02 MED ORDER — METFORMIN HCL 500 MG PO TABS: ORAL_TABLET | ORAL | 1 refills | Status: DC

## 2021-03-02 MED ORDER — TAMSULOSIN HCL 0.4 MG PO CAPS: ORAL_CAPSULE | ORAL | 1 refills | Status: DC

## 2021-03-02 MED ORDER — AMLODIPINE BESYLATE 5 MG PO TABS: 5.0000 mg | ORAL_TABLET | Freq: Every day | ORAL | 1 refills | Status: DC

## 2021-03-02 NOTE — Assessment & Plan Note (Signed)
Under good control on current regimen. Continue current regimen. Continue to monitor. Call with any concerns. Refills given. Labs drawn today.   

## 2021-03-02 NOTE — Assessment & Plan Note (Signed)
Under good control on current regimen with A1c of 6.6. Continue current regimen. Continue to monitor. Call with any concerns. Refills given. Labs drawn today.  

## 2021-03-02 NOTE — Telephone Encounter (Signed)
Joshua Lee  went to [facility] to pick up meds ( Ozempic) he was told that it was not covered by his insurance. Please advise

## 2021-03-02 NOTE — Assessment & Plan Note (Signed)
Rechecking labs today. Await results. Treat as needed.  °

## 2021-03-02 NOTE — Assessment & Plan Note (Signed)
Encouraged diet and exercise. Goal of losing 1-2lbs per week.

## 2021-03-02 NOTE — Progress Notes (Signed)
BP 110/75    Pulse 86    Temp (!) 97.5 F (36.4 C)    Wt 243 lb (110.2 kg)    SpO2 97%    BMI 36.95 kg/m    Subjective:    Patient ID: Joshua Lee, male    DOB: 06-29-1965, 56 y.o.   MRN: [id number]  HPI: Joshua Lee is a 56 y.o. male  Chief Complaint  Patient presents with   Hypertension   Diabetes   Chronic Kidney Disease   Hyperlipidemia   DIABETES Hypoglycemic episodes:no Polydipsia/polyuria: no Visual disturbance: no Chest pain: no Paresthesias: no Glucose Monitoring: no  Accucheck frequency: Not Checking Taking Insulin?: no Blood Pressure Monitoring: not checking Retinal Examination: Up to Date Foot Exam: Up to Date Diabetic Education: Completed Pneumovax: Up to Date Influenza: Up to Date Aspirin: yes  HYPERTENSION / HYPERLIPIDEMIA Satisfied with current treatment? no Duration of hypertension: chronic BP monitoring frequency: not checking BP medication side effects: no Past BP meds:  Duration of hyperlipidemia: chronic Cholesterol medication side effects: no Cholesterol supplements: garlic Past cholesterol medications: atorvastatin, vascepa Medication compliance: excellent compliance Aspirin: yes Recent stressors: no Recurrent headaches: no Visual changes: no Palpitations: no Dyspnea: no Chest pain: no Lower extremity edema: no Dizzy/lightheaded: no   Relevant past medical, surgical, family and social history reviewed and updated as indicated. Interim medical history since our last visit reviewed. Allergies and medications reviewed and updated.  Review of Systems  Constitutional: Negative.   Respiratory: Negative.    Cardiovascular: Negative.   Gastrointestinal: Negative.   Musculoskeletal: Negative.   Neurological: Negative.   Psychiatric/Behavioral: Negative.     Per HPI unless specifically indicated above     Objective:    BP 110/75    Pulse 86    Temp (!) 97.5 F (36.4 C)    Wt 243 lb (110.2 kg)    SpO2 97%    BMI 36.95  kg/m   Wt Readings from Last 3 Encounters:  03/02/21 243 lb (110.2 kg)  09/02/20 247 lb 6.4 oz (112.2 kg)  08/28/20 244 lb 9.6 oz (110.9 kg)    Physical Exam Vitals and nursing note reviewed.  Constitutional:      General: He is not in acute distress.    Appearance: Normal appearance. He is not ill-appearing, toxic-appearing or diaphoretic.  HENT:     Head: Normocephalic and atraumatic.     Right Ear: External ear normal.     Left Ear: External ear normal.     Nose: Nose normal.     Mouth/Throat:     Mouth: Mucous membranes are moist.     Pharynx: Oropharynx is clear.  Eyes:     General: No scleral icterus.       Right eye: No discharge.        Left eye: No discharge.     Extraocular Movements: Extraocular movements intact.     Conjunctiva/sclera: Conjunctivae normal.     Pupils: Pupils are equal, round, and reactive to light.  Cardiovascular:     Rate and Rhythm: Normal rate and regular rhythm.     Pulses: Normal pulses.     Heart sounds: Normal heart sounds. No murmur heard.   No friction rub. No gallop.  Pulmonary:     Effort: Pulmonary effort is normal. No respiratory distress.     Breath sounds: Normal breath sounds. No stridor. No wheezing, rhonchi or rales.  Chest:     Chest wall: No tenderness.  Musculoskeletal:  General: Normal range of motion.     Cervical back: Normal range of motion and neck supple.  Skin:    General: Skin is warm and dry.     Capillary Refill: Capillary refill takes less than 2 seconds.     Coloration: Skin is not jaundiced or pale.     Findings: No bruising, erythema, lesion or rash.  Neurological:     General: No focal deficit present.     Mental Status: He is alert and oriented to person, place, and time. Mental status is at baseline.  Psychiatric:        Mood and Affect: Mood normal.        Behavior: Behavior normal.        Thought Content: Thought content normal.        Judgment: Judgment normal.    Results for orders  placed or performed in visit on 03/02/21  Bayer DCA Hb A1c Waived  Result Value Ref Range   HB A1C (BAYER DCA - WAIVED) 6.6 (H) 4.8 - 5.6 %      Assessment & Plan:   Problem List Items Addressed This Visit       Cardiovascular and Mediastinum   Hypertension - Primary    Under good control on current regimen. Continue current regimen. Continue to monitor. Call with any concerns. Refills given. Labs drawn today.        Relevant Medications   amLODipine (NORVASC) 5 MG tablet   atorvastatin (LIPITOR) 80 MG tablet   benazepril (LOTENSIN) 40 MG tablet   empagliflozin (JARDIANCE) 25 MG TABS tablet   metoprolol tartrate (LOPRESSOR) 100 MG tablet   VASCEPA 1 g capsule   Other Relevant Orders   CBC with Differential/Platelet   Comprehensive metabolic panel     Endocrine   Type 2 diabetes mellitus with stage 3 chronic kidney disease, without long-term current use of insulin (HCC)    Under good control on current regimen with A1c of 6.6. Continue current regimen. Continue to monitor. Call with any concerns. Refills given. Labs drawn today.        Relevant Medications   atorvastatin (LIPITOR) 80 MG tablet   benazepril (LOTENSIN) 40 MG tablet   empagliflozin (JARDIANCE) 25 MG TABS tablet   metFORMIN (GLUCOPHAGE) 500 MG tablet   Semaglutide, 1 MG/DOSE, (OZEMPIC, 1 MG/DOSE,) 4 MG/3ML SOPN   Other Relevant Orders   Bayer DCA Hb A1c Waived (Completed)   CBC with Differential/Platelet   Comprehensive metabolic panel   Ambulatory referral to Podiatry     Genitourinary   CKD (chronic kidney disease), stage II    Rechecking labs today. Await results. Treat as needed.       Relevant Orders   CBC with Differential/Platelet   Comprehensive metabolic panel     Other   Hyperlipidemia    Under good control on current regimen. Continue current regimen. Continue to monitor. Call with any concerns. Refills given. Labs drawn today.        Relevant Medications   amLODipine (NORVASC) 5 MG  tablet   atorvastatin (LIPITOR) 80 MG tablet   benazepril (LOTENSIN) 40 MG tablet   metoprolol tartrate (LOPRESSOR) 100 MG tablet   VASCEPA 1 g capsule   Other Relevant Orders   CBC with Differential/Platelet   Comprehensive metabolic panel   Lipid Panel w/o Chol/HDL Ratio   Morbid obesity (HCC)    Encouraged diet and exercise. Goal of losing 1-2lbs per week.       Relevant Medications  empagliflozin (JARDIANCE) 25 MG TABS tablet   metFORMIN (GLUCOPHAGE) 500 MG tablet   Semaglutide, 1 MG/DOSE, (OZEMPIC, 1 MG/DOSE,) 4 MG/3ML SOPN   Other Visit Diagnoses     Essential hypertension       Relevant Medications   amLODipine (NORVASC) 5 MG tablet   atorvastatin (LIPITOR) 80 MG tablet   benazepril (LOTENSIN) 40 MG tablet   empagliflozin (JARDIANCE) 25 MG TABS tablet   metoprolol tartrate (LOPRESSOR) 100 MG tablet   VASCEPA 1 g capsule        Follow up plan: Return in about 6 months (around 08/30/2021) for Physical.

## 2021-03-03 ENCOUNTER — Encounter: Payer: Self-pay | Admitting: Family Medicine

## 2021-03-03 LAB — COMPREHENSIVE METABOLIC PANEL
ALT: 21 IU/L (ref 0–44)
AST: 22 IU/L (ref 0–40)
Albumin/Globulin Ratio: 1.7 (ref 1.2–2.2)
Albumin: 4.3 g/dL (ref 3.8–4.9)
Alkaline Phosphatase: 52 IU/L (ref 44–121)
BUN/Creatinine Ratio: 13 (ref 9–20)
BUN: 11 mg/dL (ref 6–24)
Bilirubin Total: 0.5 mg/dL (ref 0.0–1.2)
CO2: 21 mmol/L (ref 20–29)
Calcium: 9.5 mg/dL (ref 8.7–10.2)
Chloride: 104 mmol/L (ref 96–106)
Creatinine, Ser: 0.88 mg/dL (ref 0.76–1.27)
Globulin, Total: 2.5 g/dL (ref 1.5–4.5)
Glucose: 236 mg/dL — ABNORMAL HIGH (ref 70–99)
Potassium: 4.6 mmol/L (ref 3.5–5.2)
Sodium: 144 mmol/L (ref 134–144)
Total Protein: 6.8 g/dL (ref 6.0–8.5)
eGFR: 102 mL/min/{1.73_m2} (ref >59)

## 2021-03-03 LAB — LIPID PANEL W/O CHOL/HDL RATIO
Cholesterol, Total: 109 mg/dL (ref 100–199)
HDL: 29 mg/dL — ABNORMAL LOW (ref >39)
LDL Chol Calc (NIH): 53 mg/dL (ref 0–99)
Triglycerides: 157 mg/dL — ABNORMAL HIGH (ref 0–149)
VLDL Cholesterol Cal: 27 mg/dL (ref 5–40)

## 2021-03-03 LAB — CBC WITH DIFFERENTIAL/PLATELET
Basophils Absolute: 0 10*3/uL (ref 0.0–0.2)
Basos: 0 %
EOS (ABSOLUTE): 0.1 10*3/uL (ref 0.0–0.4)
Eos: 1 %
Hematocrit: 45.8 % (ref 37.5–51.0)
Hemoglobin: 14.9 g/dL (ref 13.0–17.7)
Immature Grans (Abs): 0 10*3/uL (ref 0.0–0.1)
Immature Granulocytes: 0 %
Lymphocytes Absolute: 1.2 10*3/uL (ref 0.7–3.1)
Lymphs: 15 %
MCH: 27.4 pg (ref 26.6–33.0)
MCHC: 32.5 g/dL (ref 31.5–35.7)
MCV: 84 fL (ref 79–97)
Monocytes Absolute: 0.5 10*3/uL (ref 0.1–0.9)
Monocytes: 6 %
Neutrophils Absolute: 6.4 10*3/uL (ref 1.4–7.0)
Neutrophils: 78 %
Platelets: 228 10*3/uL (ref 150–450)
RBC: 5.44 x10E6/uL (ref 4.14–5.80)
RDW: 15 % (ref 11.6–15.4)
WBC: 8.2 10*3/uL (ref 3.4–10.8)

## 2021-03-03 NOTE — Telephone Encounter (Signed)
I think he needs a PA. Can we please start one or check with the pharmacy?

## 2021-03-03 NOTE — Telephone Encounter (Signed)
Requested medication (s) are due for refill today - no  Requested medication (s) are on the active medication list -yes  Future visit scheduled -yes  Last refill: 03/02/21  Notes to clinic: Pharmacy request: Alternative- not covered by patient insurance   Requested Prescriptions  Pending Prescriptions Disp Refills   TRULICITY [id number] SOPN [Pharmacy Med Name: TRULICITY [id number] ML PEN]  0     Endocrinology:  Diabetes - GLP-1 Receptor Agonists Passed - 03/02/2021 11:41 AM      Passed - HBA1C is between 0 and 7.9 and within 180 days    Hemoglobin A1C  Date Value Ref Range Status  09/04/2015 6.9  Final  09/04/2015 6.9  Final   HB A1C (BAYER DCA - WAIVED)  Date Value Ref Range Status  03/02/2021 6.6 (H) 4.8 - 5.6 % Final    Comment:             Prediabetes: 5.7 - 6.4          Diabetes: >6.4          Glycemic control for adults with diabetes: <7.0           Passed - Valid encounter within last 6 months    Recent Outpatient Visits           Yesterday Primary hypertension   [facility] [place], [name], DO   6 months ago Routine general medical examination at a health care facility   [facility], [name], DO   8 months ago Abdominal pain, unspecified abdominal location   [facility] [place], Avanti, MD   1 year ago Type 2 diabetes mellitus with stage 3 chronic kidney disease, without long-term current use of insulin, unspecified whether stage 3a or 3b CKD ([place])   [place], [name], DO   1 year ago Type 2 diabetes mellitus with stage 3 chronic kidney disease, without long-term current use of insulin, unspecified whether stage 3a or 3b CKD ([place])   [facility] [place], [name], DO       Future Appointments             In 6 months Johnson, Barb Merino, DO [place]               Requested Prescriptions  Pending Prescriptions Disp Refills   TRULICITY [id number]  [id number] SOPN [Pharmacy Med Name: TRULICITY [id number] ML PEN]  0     Endocrinology:  Diabetes - GLP-1 Receptor Agonists Passed - 03/02/2021 11:41 AM      Passed - HBA1C is between 0 and 7.9 and within 180 days    Hemoglobin A1C  Date Value Ref Range Status  09/04/2015 6.9  Final  09/04/2015 6.9  Final   HB A1C (BAYER DCA - WAIVED)  Date Value Ref Range Status  03/02/2021 6.6 (H) 4.8 - 5.6 % Final    Comment:             Prediabetes: 5.7 - 6.4          Diabetes: >6.4          Glycemic control for adults with diabetes: <7.0           Passed - Valid encounter within last 6 months    Recent Outpatient Visits           Yesterday Primary hypertension   [facility] [place], [name], DO   6 months ago Routine general  medical examination at a health care facility   [facility] [place] P, DO   8 months ago Abdominal pain, unspecified abdominal location   [facility] Vigg, Avanti, MD   1 year ago Type 2 diabetes mellitus with stage 3 chronic kidney disease, without long-term current use of insulin, unspecified whether stage 3a or 3b CKD ([place])   [place], [name], DO   1 year ago Type 2 diabetes mellitus with stage 3 chronic kidney disease, without long-term current use of insulin, unspecified whether stage 3a or 3b CKD ([place])   [place], Barb Merino, DO       Future Appointments             In 6 months Johnson, Barb Merino, DO [place]

## 2021-03-04 NOTE — Telephone Encounter (Signed)
PA initiated via CoverMyMeds for Ozempic 1MG /Dose 4MG /3ML  KEY: [id number] Waiting on response.

## 2021-03-05 ENCOUNTER — Telehealth: Payer: Self-pay | Admitting: Family Medicine

## 2021-03-05 MED ORDER — TRULICITY 3 MG/0.5ML ~~LOC~~ SOAJ: 3.0000 mg | SUBCUTANEOUS | 1 refills | Status: DC

## 2021-03-05 MED ORDER — TRULICITY 1.5 MG/0.5ML ~~LOC~~ SOAJ: 1.5000 mg | SUBCUTANEOUS | 0 refills | Status: DC

## 2021-03-05 NOTE — Telephone Encounter (Signed)
Will send trulicity to see if it's covered.

## 2021-03-05 NOTE — Addendum Note (Signed)
Addended by: Pablo Ledger on: 03/05/2021 04:11 PM   Modules accepted: Orders

## 2021-03-05 NOTE — Telephone Encounter (Signed)
Copied from [place] [phone]. Topic: General - Other >> Mar 05, 2021 11:45 AM Yvette Rack wrote: Reason for CRM: Pt stated his insurance will be sending in PA form for his medications: empagliflozin (JARDIANCE) 25 MG TABS tablet and Semaglutide, 1 MG/DOSE, (OZEMPIC, 1 MG/DOSE,) 4 MG/3ML SOPN.  Pt stated he can not afford the cost of these medications so he really needs the forms to be completed asap

## 2021-03-05 NOTE — Telephone Encounter (Signed)
PA for Ozempic denied.  Will await fax for denial letter.

## 2021-03-06 NOTE — Telephone Encounter (Signed)
He is to take 1 shot weekly of the 1.5 for 4 weeks, then go up to the 3mg  1 shot weekly

## 2021-03-06 NOTE — Telephone Encounter (Signed)
Returned call to patient to advise Ozempic has been denied and provider has sent in Trulicity.  Patient wants to know directions on how to take Trulicity because 2 pens were sent in.  Patient was advised to obtain discount card for jardiance, medication does not need prior authorization. Patient expresses concern about not being able to afford medications due to price without coupon.

## 2021-03-10 ENCOUNTER — Telehealth: Payer: Self-pay | Admitting: Family Medicine

## 2021-03-10 NOTE — Telephone Encounter (Signed)
Copied from CRM [phone]. Topic: Quick Communication - Rx Refill/Question >> Mar 10, 2021  2:12 PM Pawlus, Maxine Glenn A wrote: Pt called in and stated he can't afford Dulaglutide (TRULICITY) 3 MG/0.5ML SOPN, pt wanted to know if a coupon was sent in or if samples are available, please advise.

## 2021-03-11 ENCOUNTER — Telehealth: Payer: BLUE CROSS/BLUE SHIELD

## 2021-03-11 ENCOUNTER — Ambulatory Visit: Payer: Self-pay

## 2021-03-11 ENCOUNTER — Other Ambulatory Visit: Payer: Self-pay

## 2021-03-11 DIAGNOSIS — N183 Chronic kidney disease, stage 3 unspecified: Secondary | ICD-10-CM

## 2021-03-11 DIAGNOSIS — I10 Essential (primary) hypertension: Secondary | ICD-10-CM

## 2021-03-11 DIAGNOSIS — E782 Mixed hyperlipidemia: Secondary | ICD-10-CM

## 2021-03-11 DIAGNOSIS — E1122 Type 2 diabetes mellitus with diabetic chronic kidney disease: Secondary | ICD-10-CM

## 2021-03-11 NOTE — Chronic Care Management (AMB) (Signed)
Care Management    RN Visit Note  03/11/2021 Name: Joshua Lee MRN: [id number] DOB: 1965/08/03  Subjective: Joshua Lee is a 56 y.o. year old male who is a primary care patient of [name], DO. The care management team was consulted for assistance with disease management and care coordination needs.    Engaged with patient by telephone for follow up visit in response to provider referral for case management and/or care coordination services.   Consent to Services:   [name] was given information about Care Management services today including:  Care Management services includes personalized support from designated clinical staff supervised by his physician, including individualized plan of care and coordination with other care providers 24/7 contact phone numbers for assistance for urgent and routine care needs. The patient may stop case management services at any time by phone call to the office staff.  Patient agreed to services and consent obtained.   Assessment: Review of patient past medical history, allergies, medications, health status, including review of consultants reports, laboratory and other test data, was performed as part of comprehensive evaluation and provision of chronic care management services.   SDOH (Social Determinants of Health) assessments and interventions performed:    Care Plan  Allergies  Allergen Reactions   Penicillins Rash    Has patient had a PCN reaction causing immediate rash, facial/tongue/throat swelling, SOB or lightheadedness with hypotension: unknown Has patient had a PCN reaction causing severe rash involving mucus membranes or skin necrosis: unknown Has patient had a PCN reaction that required hospitalization: unknown Has patient had a PCN reaction occurring within the last 10 years: no If all of the above answers are "NO", then may proceed with Cephalosporin use.     Outpatient Encounter Medications as of 03/11/2021   Medication Sig   acetaminophen (TYLENOL) 650 MG CR tablet Take 650-1,950 mg by mouth every 8 (eight) hours as needed for pain.   amLODipine (NORVASC) 5 MG tablet Take 1 tablet (5 mg total) by mouth daily.   aspirin 81 MG tablet Take 81 mg by mouth daily.   atorvastatin (LIPITOR) 80 MG tablet Take 1 tablet (80 mg total) by mouth at bedtime.   benazepril (LOTENSIN) 40 MG tablet Take 1 tablet (40 mg total) by mouth daily.   calcium carbonate (TUMS EX) 750 MG chewable tablet Chew 2 tablets by mouth daily as needed for heartburn.    cetirizine (ZYRTEC) 10 MG tablet Take 10 mg by mouth daily as needed for allergies.  (Patient not taking: Reported on 03/02/2021)   chlorhexidine (PERIDEX) 0.12 % solution SMARTSIG:0.5 Ounce(s) By Mouth Morning-Night   Coenzyme Q-10 100 MG capsule Take 100 mg by mouth daily.   CONTOUR NEXT TEST test strip USE AS DIRECTED   Dulaglutide (TRULICITY) 1.5 IB/7.0WU SOPN Inject 1.5 mg into the skin once a week.   Dulaglutide (TRULICITY) 3 GQ/9.1QX SOPN Inject 3 mg as directed once a week.   empagliflozin (JARDIANCE) 25 MG TABS tablet TAKE 1 TABLET(25 MG) BY MOUTH DAILY   fluticasone (FLONASE) 50 MCG/ACT nasal spray Place 1 spray into both nostrils 2 (two) times daily.   Garlic 4503 MG CAPS Take 1,000 mg by mouth daily.   metFORMIN (GLUCOPHAGE) 500 MG tablet TAKE 2 TABLETS(1000 MG) BY MOUTH TWICE DAILY WITH A MEAL   metoprolol tartrate (LOPRESSOR) 100 MG tablet Take 1 tablet (100 mg total) by mouth 2 (two) times daily.   Potassium Citrate 15 MEQ (1620 MG) TBCR Take 15 mEq by  mouth 2 (two) times daily. 2 tab QAM, 1 tab QPM   tamsulosin (FLOMAX) 0.4 MG CAPS capsule TAKE 1 CAPSULE(0.4 MG) BY MOUTH DAILY   tolnaftate (TINACTIN) 1 % spray Apply 1 application topically as needed.   VASCEPA 1 g capsule Take 2 capsules (2 g total) by mouth 2 (two) times daily.   No facility-administered encounter medications on file as of 03/11/2021.    Patient Active Problem List   Diagnosis Date  Noted   Type 2 diabetes mellitus with stage 3 chronic kidney disease, without long-term current use of insulin ([place]) 03/06/2019   Morbid obesity ([place]) 11/10/2017   CKD (chronic kidney disease), stage II 03/16/2016   Plantar fasciitis 04/17/2015   Osteoarthritis of both feet 03/05/2015   Nephrolithiasis 12/02/2014   Hypertension 07/02/2014   Hyperlipidemia 07/02/2014   Obesity, diabetes, and hypertension syndrome ([place]) 07/02/2014   Difficult airway for intubation 05/09/2014    Conditions to be addressed/monitored: HTN, HLD, and DMII  Care Plan : RNCM: General Plan of Care (Adult) for Chronic Disease Management and Care Coordination needs  Updates made by Vanita Ingles, RN since 03/11/2021 12:00 AM     Problem: RNCM: Development of plan of care for Chronic Disease Management and Care Coordination Needs (HTN, HLD, DM)   Priority: High     Long-Range Goal: RNCM: Effective management  of plan of care for Chronic Disease Management and Care Coordination Needs (HTN, HLD, DM)   Start Date: 11/07/2020  Expected End Date: 11/07/2021  Priority: High  Note:   Current Barriers:  Knowledge Deficits related to plan of care for management of HTN, HLD, and DMII  Chronic Disease Management support and education needs related to HTN, HLD, and DMII Lacks caregiver support.   RNCM Clinical Goal(s):  Patient will verbalize understanding of plan for management of HTN, HLD, and DMII  verbalize basic understanding of HTN, HLD, and DMII disease process and self health management plan  take all medications exactly as prescribed and will call provider for medication related questions demonstrate understanding of rationale for each prescribed medication  attend all scheduled medical appointments: 09-02-2021 demonstrate improved and ongoing adherence to prescribed treatment plan for HTN, HLD, and DMII as evidenced by daily monitoring and recording of CBG  adherence to ADA/ carb modified diet adherence to  prescribed medication regimen contacting provider for new or worsened symptoms or questions  demonstrate improved and ongoing health management independence  demonstrate a decrease in HTN, HLD, and DMII exacerbations  demonstrate ongoing self health care management ability effective management of chronic diseases through collaboration with RN Care manager, provider, and care team.   Interventions: 1:1 collaboration with primary care provider regarding development and update of comprehensive plan of care as evidenced by provider attestation and co-signature Inter-disciplinary care team collaboration (see longitudinal plan of care) Evaluation of current treatment plan related to  self management and patient's adherence to plan as established by provider   SDOH Barriers (Status: Goal on track: YES.)  Patient interviewed and SDOH assessment performed        Patient interviewed and appropriate assessments performed Provided patient with information about resources for [facility] and care guides to assist with any new SDOH needs Discussed plans with patient for ongoing care management follow up and provided patient with direct contact information for care management team Advised patient to call the office for changes in [place], questions, and concerns    Diabetes:  (Status: Goal on track: YES.) Lab Results  Component Value  Date   HGBA1C 6.6 (H) 03/02/2021  Assessed patient's understanding of A1c goal: <7% Provided education to patient about basic DM disease process; Reviewed medications with patient and discussed importance of medication adherence. 01-07-2021: The patient is compliant with medications. 03-11-2021: The patient is having issues with affordability of medications. Call made to [facility] today and talked to the pharmacist at [phone]. Was directed to call Drue Dun at the [facility] outpatient pharmacy at [phone]. Spoke with [place] who advised about shortage of trulicity 3 mg but 1.5  mg being available. The patient can get a coupon and get the Trulicity 1.5 mg for $26.37 and a coupon for Jardiance at [place] is assisting the [facility] with calling [facility] and seeing if an e-voucher can be sent to [facility] so the patient can get the needed medications.  Drue Dun is to call the RNCM back to collaborate on the best way to help the patient to obtain the needed. Drue Dun returned the call and the patient even with the coupon would still have to pay 169.00 for jardiance. The pharm D at [facility] outpatient pharmacy states if the provider will write for Farxiga instead of Jardiance then it would be 15.00 a month.  The fax number to the [facility] pharmacy is [phone]. The RNCM will collaborate with the pcp and patient concerning the information received from the help of the pharmacist at [place].  The patient wants to call Kristi at the [facility] and ask questions before agreeing to switch medications over. Will alert Dr. Wynetta Emery of the current findings and the patient will also call RNCM back with his decision. The patient states he does have some jardiance and Ozempic on hand, but will run out and needs to have medications he can take. Will continue to monitor.        Reviewed prescribed diet with patient heart healthy/ADA diet. 03-11-2021: The patient is compliant with heart healthy diet ; Counseled on importance of regular laboratory monitoring as prescribed. 03-11-2021: The patient has regular lab test;        Discussed plans with patient for ongoing care management follow up and provided patient with direct contact information for care management team;      Provided patient with written educational materials related to hypo and hyperglycemia and importance of correct treatment.  01-07-2021: The patient denies any issues with hypo and hyperglycemia. States that the lowest he has seen is 81.  Denies any issues with hypoglycemia at this time. 03-11-2021: The patient is not checking his blood sugars. Encouraged the  patient to check his blood sugars.       Reviewed scheduled/upcoming provider appointments including: 03-02-2021 at 1020 am;         Advised patient, providing education and rationale, to check cbg daily and record. 01-07-2021: States that he has not been taking his blood sugar reading consistently.  The patient states that his blood sugars have been 81 to 130 that was back the first of December. Education on checking blood sugars at least daily or several times a week.       call provider for findings outside established parameters;       Review of patient status, including review of consultants reports, relevant laboratory and other test results, and medications completed;       Screening for signs and symptoms of depression related to chronic disease state;        Assessed social determinant of health barriers;         Hyperlipidemia:  (  Status: Goal on track: NO.) Lab Results  Component Value Date   CHOL 109 03/02/2021   HDL 29 (L) 03/02/2021   LDLCALC 53 03/02/2021   TRIG 157 (H) 03/02/2021   CHOLHDL 6.5 (H) 02/09/2017     Medication review performed; medication list updated in electronic medical record. 03-11-2021: The patient is taking Lipitor 80 mg daily as prescribed.  Provider established cholesterol goals reviewed. 03-11-2021: Education and support given. Review of heart healthy/ADA diet.  Counseled on importance of regular laboratory monitoring as prescribed; Provided HLD educational materials; Reviewed role and benefits of statin for ASCVD risk reduction; Discussed strategies to manage statin-induced myalgias; Reviewed importance of limiting foods high in cholesterol; Reviewed exercise goals and target of 150 minutes per week; Screening for signs and symptoms of depression related to chronic disease state;  Assessed social determinant of health barriers;   Hypertension: (Status: Goal on track: YES.) Last practice recorded BP readings:  BP Readings from Last 3 Encounters:   03/02/21 110/75  09/02/20 135/77  08/28/20 102/64  Most recent eGFR/CrCl:  Lab Results  Component Value Date   EGFR 104 08/28/2020    No components found for: CRCL  Evaluation of current treatment plan related to hypertension self management and patient's adherence to plan as established by provider. 03-11-2021: The patient denies any concerns with HTN and heart health. The patient is concerned about his mother and expressed how she has a lot of things going on and he is worried about her. Reflective listening and support given. Review of resources and to be patient with his parent ;   Provided education to patient re: stroke prevention, s/s of heart attack and stroke; Reviewed prescribed diet heart healthy/ADA  Reviewed medications with patient and discussed importance of compliance. 03-11-2021: The patient is compliant with medications for HTN health;  Counseled on adverse effects of illicit drug and excessive alcohol use in patients with high blood pressure;  Discussed plans with patient for ongoing care management follow up and provided patient with direct contact information for care management team; Advised patient, providing education and rationale, to monitor blood pressure daily and record, calling PCP for findings outside established parameters;  Reviewed scheduled/upcoming provider appointments including:  Provided education on prescribed diet heart healthy/ADA;  Discussed complications of poorly controlled blood pressure such as heart disease, stroke, circulatory complications, vision complications, kidney impairment, sexual dysfunction;   Patient Goals/Self-Care Activities: Patient will self administer medications as prescribed as evidenced by self report/primary caregiver report  Patient will attend all scheduled provider appointments as evidenced by clinician review of documented attendance to scheduled appointments and patient/caregiver report Patient will call pharmacy for  medication refills as evidenced by patient report and review of pharmacy fill history as appropriate Patient will attend church or other social activities as evidenced by patient report Patient will continue to perform ADL's independently as evidenced by patient/caregiver report Patient will continue to perform IADL's independently as evidenced by patient/caregiver report Patient will call provider office for new concerns or questions as evidenced by review of documented incoming telephone call notes and patient report Patient will work with BSW to address care coordination needs and will continue to work with the clinical team to address health care and disease management related needs as evidenced by documented adherence to scheduled care management/care coordination appointments - check blood pressure 3 times per week - check blood pressure daily - check blood pressure weekly - choose a place to take my blood pressure (home, clinic or office, retail  store) - learn about high blood pressure - keep a blood pressure log - take blood pressure log to all doctor appointments - call doctor for signs and symptoms of high blood pressure - develop an action plan for high blood pressure - keep all doctor appointments - take medications for blood pressure exactly as prescribed - begin an exercise program - report new symptoms to your doctor - eat more whole grains, fruits and vegetables, lean meats and healthy fats - call for medicine refill 2 or 3 days before it runs out - take all medications exactly as prescribed - call doctor with any symptoms you believe are related to your medicine - call doctor when you experience any new symptoms - go to all doctor appointments as scheduled - adhere to prescribed diet: Heart Healthy/ADA diet       Plan: Telephone follow up appointment with care management team member scheduled for:  04-03-2021 at [place] am  Noreene Larsson RN, MSN, [facility] Mobile: [phone]

## 2021-03-11 NOTE — Patient Instructions (Signed)
Visit Information  Thank you for taking time to visit with me today. Please don't hesitate to contact me if I can be of assistance to you before our next scheduled telephone appointment.  Following are the goals we discussed today:  RNCM Clinical Goal(s):  Patient will verbalize understanding of plan for management of HTN, HLD, and DMII  verbalize basic understanding of HTN, HLD, and DMII disease process and self health management plan  take all medications exactly as prescribed and will call provider for medication related questions demonstrate understanding of rationale for each prescribed medication  attend all scheduled medical appointments: 09-02-2021 demonstrate improved and ongoing adherence to prescribed treatment plan for HTN, HLD, and DMII as evidenced by daily monitoring and recording of CBG  adherence to ADA/ carb modified diet adherence to prescribed medication regimen contacting provider for new or worsened symptoms or questions  demonstrate improved and ongoing health management independence  demonstrate a decrease in HTN, HLD, and DMII exacerbations  demonstrate ongoing self health care management ability effective management of chronic diseases through collaboration with RN Care manager, provider, and care team.    Interventions: 1:1 collaboration with primary care provider regarding development and update of comprehensive plan of care as evidenced by provider attestation and co-signature Inter-disciplinary care team collaboration (see longitudinal plan of care) Evaluation of current treatment plan related to  self management and patient's adherence to plan as established by provider     SDOH Barriers (Status: Goal on track: YES.)  Patient interviewed and SDOH assessment performed        Patient interviewed and appropriate assessments performed Provided patient with information about resources for [facility] and care guides to assist with any new SDOH needs Discussed  plans with patient for ongoing care management follow up and provided patient with direct contact information for care management team Advised patient to call the office for changes in [place], questions, and concerns       Diabetes:  (Status: Goal on track: YES.)      Lab Results  Component Value Date    HGBA1C 6.6 (H) 03/02/2021  Assessed patient's understanding of A1c goal: <7% Provided education to patient about basic DM disease process; Reviewed medications with patient and discussed importance of medication adherence. 01-07-2021: The patient is compliant with medications. 03-11-2021: The patient is having issues with affordability of medications. Call made to [facility] today and talked to the pharmacist at [phone]. Was directed to call Drue Dun at the [facility] outpatient pharmacy at [phone]. Spoke with Kirsitie who advised about shortage of trulicity 3 mg but 1.5 mg being available. The patient can get a coupon and get the Trulicity 1.5 mg for $18.84 and a coupon for Jardiance at [place] is assisting the [facility] with calling [facility] and seeing if an e-voucher can be sent to [facility] so the patient can get the needed medications.  Drue Dun is to call the RNCM back to collaborate on the best way to help the patient to obtain the needed. Drue Dun returned the call and the patient even with the coupon would still have to pay 169.00 for jardiance. The pharm D at [facility] outpatient pharmacy states if the provider will write for Farxiga instead of Jardiance then it would be 15.00 a month.  The fax number to the [facility] pharmacy is [phone]. The RNCM will collaborate with the pcp and patient concerning the information received from the help of the pharmacist at [place].  The patient wants to call Kristi at the [facility]  and ask questions before agreeing to switch medications over. Will alert Dr. Wynetta Emery of the current findings and the patient will also call RNCM back with his decision. The patient states he does have  some jardiance and Ozempic on hand, but will run out and needs to have medications he can take. Will continue to monitor.        Reviewed prescribed diet with patient heart healthy/ADA diet. 03-11-2021: The patient is compliant with heart healthy diet ; Counseled on importance of regular laboratory monitoring as prescribed. 03-11-2021: The patient has regular lab test;        Discussed plans with patient for ongoing care management follow up and provided patient with direct contact information for care management team;      Provided patient with written educational materials related to hypo and hyperglycemia and importance of correct treatment.  01-07-2021: The patient denies any issues with hypo and hyperglycemia. States that the lowest he has seen is 81.  Denies any issues with hypoglycemia at this time. 03-11-2021: The patient is not checking his blood sugars. Encouraged the patient to check his blood sugars.       Reviewed scheduled/upcoming provider appointments including: 03-02-2021 at 1020 am;         Advised patient, providing education and rationale, to check cbg daily and record. 01-07-2021: States that he has not been taking his blood sugar reading consistently.  The patient states that his blood sugars have been 81 to 130 that was back the first of December. Education on checking blood sugars at least daily or several times a week.       call provider for findings outside established parameters;       Review of patient status, including review of consultants reports, relevant laboratory and other test results, and medications completed;       Screening for signs and symptoms of depression related to chronic disease state;        Assessed social determinant of health barriers;          Hyperlipidemia:  (Status: Goal on track: NO.)      Lab Results  Component Value Date    CHOL 109 03/02/2021    HDL 29 (L) 03/02/2021    LDLCALC 53 03/02/2021    TRIG 157 (H) 03/02/2021    CHOLHDL 6.5 (H)  02/09/2017      Medication review performed; medication list updated in electronic medical record. 03-11-2021: The patient is taking Lipitor 80 mg daily as prescribed.  Provider established cholesterol goals reviewed. 03-11-2021: Education and support given. Review of heart healthy/ADA diet.  Counseled on importance of regular laboratory monitoring as prescribed; Provided HLD educational materials; Reviewed role and benefits of statin for ASCVD risk reduction; Discussed strategies to manage statin-induced myalgias; Reviewed importance of limiting foods high in cholesterol; Reviewed exercise goals and target of 150 minutes per week; Screening for signs and symptoms of depression related to chronic disease state;  Assessed social determinant of health barriers;    Hypertension: (Status: Goal on track: YES.) Last practice recorded BP readings:     BP Readings from Last 3 Encounters:  03/02/21 110/75  09/02/20 135/77  08/28/20 102/64  Most recent eGFR/CrCl:       Lab Results  Component Value Date    EGFR 104 08/28/2020    No components found for: CRCL   Evaluation of current treatment plan related to hypertension self management and patient's adherence to plan as established by provider. 03-11-2021: The patient denies  any concerns with HTN and heart health. The patient is concerned about his mother and expressed how she has a lot of things going on and he is worried about her. Reflective listening and support given. Review of resources and to be patient with his parent ;   Provided education to patient re: stroke prevention, s/s of heart attack and stroke; Reviewed prescribed diet heart healthy/ADA  Reviewed medications with patient and discussed importance of compliance. 03-11-2021: The patient is compliant with medications for HTN health;  Counseled on adverse effects of illicit drug and excessive alcohol use in patients with high blood pressure;  Discussed plans with patient for ongoing  care management follow up and provided patient with direct contact information for care management team; Advised patient, providing education and rationale, to monitor blood pressure daily and record, calling PCP for findings outside established parameters;  Reviewed scheduled/upcoming provider appointments including:  Provided education on prescribed diet heart healthy/ADA;  Discussed complications of poorly controlled blood pressure such as heart disease, stroke, circulatory complications, vision complications, kidney impairment, sexual dysfunction;    Patient Goals/Self-Care Activities: Patient will self administer medications as prescribed as evidenced by self report/primary caregiver report  Patient will attend all scheduled provider appointments as evidenced by clinician review of documented attendance to scheduled appointments and patient/caregiver report Patient will call pharmacy for medication refills as evidenced by patient report and review of pharmacy fill history as appropriate Patient will attend church or other social activities as evidenced by patient report Patient will continue to perform ADL's independently as evidenced by patient/caregiver report Patient will continue to perform IADL's independently as evidenced by patient/caregiver report Patient will call provider office for new concerns or questions as evidenced by review of documented incoming telephone call notes and patient report Patient will work with BSW to address care coordination needs and will continue to work with the clinical team to address health care and disease management related needs as evidenced by documented adherence to scheduled care management/care coordination appointments - check blood pressure 3 times per week - check blood pressure daily - check blood pressure weekly - choose a place to take my blood pressure (home, clinic or office, retail store) - learn about high blood pressure - keep a blood  pressure log - take blood pressure log to all doctor appointments - call doctor for signs and symptoms of high blood pressure - develop an action plan for high blood pressure - keep all doctor appointments - take medications for blood pressure exactly as prescribed - begin an exercise program - report new symptoms to your doctor - eat more whole grains, fruits and vegetables, lean meats and healthy fats - call for medicine refill 2 or 3 days before it runs out - take all medications exactly as prescribed - call doctor with any symptoms you believe are related to your medicine - call doctor when you experience any new symptoms - go to all doctor appointments as scheduled - adhere to prescribed diet: Heart Healthy/ADA diet    Our next appointment is by telephone on 04-03-2021 at 0945 am  Please call the care guide team at [phone] if you need to cancel or reschedule your appointment.   If you are experiencing a Mental Health or [place] or need someone to talk to, please call the Suicide and Crisis Lifeline: 988 call the [place] National Suicide Prevention Lifeline: 601-323-2999 or TTY: 2135234600 TTY (813)108-1765) to talk to a trained counselor call 1-800-273-TALK (toll free, 24 hour  hotline)   The patient verbalized understanding of instructions, educational materials, and care plan provided today and declined offer to receive copy of patient instructions, educational materials, and care plan.   Telephone follow up appointment with care management team member scheduled for: 04-03-2021 at [place] am  Noreene Larsson RN, MSN, [facility] Mobile: [phone]

## 2021-03-11 NOTE — Telephone Encounter (Signed)
Returned patient phone call, advised patient referral has been sent to CCM team to help with medication cost. Patient states he can afford the 1.5 Trulicity but can not afford the 3.0 mg Trulicity or Jardiance. Per patient he is expecting a phone call today from [facility].

## 2021-03-12 ENCOUNTER — Ambulatory Visit: Payer: Self-pay

## 2021-03-12 ENCOUNTER — Other Ambulatory Visit: Payer: Self-pay

## 2021-03-12 DIAGNOSIS — M9905 Segmental and somatic dysfunction of pelvic region: Secondary | ICD-10-CM | POA: Diagnosis not present

## 2021-03-12 DIAGNOSIS — M9903 Segmental and somatic dysfunction of lumbar region: Secondary | ICD-10-CM | POA: Diagnosis not present

## 2021-03-12 DIAGNOSIS — M5136 Other intervertebral disc degeneration, lumbar region: Secondary | ICD-10-CM | POA: Diagnosis not present

## 2021-03-12 DIAGNOSIS — M5416 Radiculopathy, lumbar region: Secondary | ICD-10-CM | POA: Diagnosis not present

## 2021-03-12 NOTE — Chronic Care Management (AMB) (Signed)
Care Management    RN Visit Note  03/12/2021 Name: Joshua Lee MRN: [id number] DOB: 01/02/1966  Subjective: Joshua Lee is a 56 y.o. year old male who is a primary care patient of [name], DO. The care management team was consulted for assistance with disease management and care coordination needs.    Engaged with patient by telephone for follow up visit in response to provider referral for case management and/or care coordination services.   Consent to Services:   [name] was given information about Care Management services today including:  Care Management services includes personalized support from designated clinical staff supervised by his physician, including individualized plan of care and coordination with other care providers 24/7 contact phone numbers for assistance for urgent and routine care needs. The patient may stop case management services at any time by phone call to the office staff.  Patient agreed to services and consent obtained.   Assessment: Review of patient past medical history, allergies, medications, health status, including review of consultants reports, laboratory and other test data, was performed as part of comprehensive evaluation and provision of chronic care management services.   SDOH (Social Determinants of Health) assessments and interventions performed:    Care Plan  Allergies  Allergen Reactions   Penicillins Rash    Has patient had a PCN reaction causing immediate rash, facial/tongue/throat swelling, SOB or lightheadedness with hypotension: unknown Has patient had a PCN reaction causing severe rash involving mucus membranes or skin necrosis: unknown Has patient had a PCN reaction that required hospitalization: unknown Has patient had a PCN reaction occurring within the last 10 years: no If all of the above answers are "NO", then may proceed with Cephalosporin use.     Outpatient Encounter Medications as of 03/12/2021   Medication Sig   acetaminophen (TYLENOL) 650 MG CR tablet Take 650-1,950 mg by mouth every 8 (eight) hours as needed for pain.   amLODipine (NORVASC) 5 MG tablet Take 1 tablet (5 mg total) by mouth daily.   aspirin 81 MG tablet Take 81 mg by mouth daily.   atorvastatin (LIPITOR) 80 MG tablet Take 1 tablet (80 mg total) by mouth at bedtime.   benazepril (LOTENSIN) 40 MG tablet Take 1 tablet (40 mg total) by mouth daily.   calcium carbonate (TUMS EX) 750 MG chewable tablet Chew 2 tablets by mouth daily as needed for heartburn.    cetirizine (ZYRTEC) 10 MG tablet Take 10 mg by mouth daily as needed for allergies.  (Patient not taking: Reported on 03/02/2021)   chlorhexidine (PERIDEX) 0.12 % solution SMARTSIG:0.5 Ounce(s) By Mouth Morning-Night   Coenzyme Q-10 100 MG capsule Take 100 mg by mouth daily.   CONTOUR NEXT TEST test strip USE AS DIRECTED   Dulaglutide (TRULICITY) 1.5 WE/3.1VQ SOPN Inject 1.5 mg into the skin once a week.   Dulaglutide (TRULICITY) 3 MG/8.6PY SOPN Inject 3 mg as directed once a week.   empagliflozin (JARDIANCE) 25 MG TABS tablet TAKE 1 TABLET(25 MG) BY MOUTH DAILY   fluticasone (FLONASE) 50 MCG/ACT nasal spray Place 1 spray into both nostrils 2 (two) times daily.   Garlic 1950 MG CAPS Take 1,000 mg by mouth daily.   metFORMIN (GLUCOPHAGE) 500 MG tablet TAKE 2 TABLETS(1000 MG) BY MOUTH TWICE DAILY WITH A MEAL   metoprolol tartrate (LOPRESSOR) 100 MG tablet Take 1 tablet (100 mg total) by mouth 2 (two) times daily.   Potassium Citrate 15 MEQ (1620 MG) TBCR Take 15 mEq  mouth 2 (two) times daily. 2 tab QAM, 1 tab QPM   tamsulosin (FLOMAX) 0.4 MG CAPS capsule TAKE 1 CAPSULE(0.4 MG) BY MOUTH DAILY   tolnaftate (TINACTIN) 1 % spray Apply 1 application topically as needed.   VASCEPA 1 g capsule Take 2 capsules (2 g total) by mouth 2 (two) times daily.   No facility-administered encounter medications on file as of 03/12/2021.    Patient Active Problem List   Diagnosis Date  Noted   Type 2 diabetes mellitus with stage 3 chronic kidney disease, without long-term current use of insulin ([place]) 03/06/2019   Morbid obesity ([place]) 11/10/2017   CKD (chronic kidney disease), stage II 03/16/2016   Plantar fasciitis 04/17/2015   Osteoarthritis of both feet 03/05/2015   Nephrolithiasis 12/02/2014   Hypertension 07/02/2014   Hyperlipidemia 07/02/2014   Obesity, diabetes, and hypertension syndrome ([place]) 07/02/2014   Difficult airway for intubation 05/09/2014    Conditions to be addressed/monitored: DMII  Care Plan : RNCM: General Plan of Care (Adult) for Chronic Disease Management and Care Coordination needs  Updates made by Vanita Ingles, RN since 03/12/2021 12:00 AM     Problem: RNCM: Development of plan of care for Chronic Disease Management and Care Coordination Needs (HTN, HLD, DM)   Priority: High     Long-Range Goal: RNCM: Effective management  of plan of care for Chronic Disease Management and Care Coordination Needs (HTN, HLD, DM)   Start Date: 11/07/2020  Expected End Date: 11/07/2021  Priority: High  Note:   Current Barriers:  Knowledge Deficits related to plan of care for management of HTN, HLD, and DMII  Chronic Disease Management support and education needs related to HTN, HLD, and DMII Lacks caregiver support.   RNCM Clinical Goal(s):  Patient will verbalize understanding of plan for management of HTN, HLD, and DMII  verbalize basic understanding of HTN, HLD, and DMII disease process and self health management plan  take all medications exactly as prescribed and will call provider for medication related questions demonstrate understanding of rationale for each prescribed medication  attend all scheduled medical appointments: 09-02-2021 demonstrate improved and ongoing adherence to prescribed treatment plan for HTN, HLD, and DMII as evidenced by daily monitoring and recording of CBG  adherence to ADA/ carb modified diet adherence to prescribed  medication regimen contacting provider for new or worsened symptoms or questions  demonstrate improved and ongoing health management independence  demonstrate a decrease in HTN, HLD, and DMII exacerbations  demonstrate ongoing self health care management ability effective management of chronic diseases through collaboration with RN Care manager, provider, and care team.   Interventions: 1:1 collaboration with primary care provider regarding development and update of comprehensive plan of care as evidenced by provider attestation and co-signature Inter-disciplinary care team collaboration (see longitudinal plan of care) Evaluation of current treatment plan related to  self management and patient's adherence to plan as established by provider   SDOH Barriers (Status: Goal on track: YES.)  Patient interviewed and SDOH assessment performed        Patient interviewed and appropriate assessments performed Provided patient with information about resources for [facility] and care guides to assist with any new SDOH needs Discussed plans with patient for ongoing care management follow up and provided patient with direct contact information for care management team Advised patient to call the office for changes in [place], questions, and concerns    Diabetes:  (Status: Goal on track: YES.) Lab Results  Component Value Date  HGBA1C 6.6 (H) 03/02/2021  Assessed patient's understanding of A1c goal: <7% Provided education to patient about basic DM disease process; Reviewed medications with patient and discussed importance of medication adherence. 01-07-2021: The patient is compliant with medications. 03-11-2021: The patient is having issues with affordability of medications. Call made to [facility] today and talked to the pharmacist at [phone]. Was directed to call Drue Dun at the [facility] outpatient pharmacy at [phone]. Spoke with [place] who advised about shortage of trulicity 3 mg but 1.5 mg being  available. The patient can get a coupon and get the Trulicity 1.5 mg for $09.62 and a coupon for Jardiance at [place] is assisting the [facility] with calling [facility] and seeing if an e-voucher can be sent to [facility] so the patient can get the needed medications.  Drue Dun is to call the RNCM back to collaborate on the best way to help the patient to obtain the needed. Drue Dun returned the call and the patient even with the coupon would still have to pay 169.00 for jardiance. The pharm D at [facility] outpatient pharmacy states if the provider will write for Farxiga instead of Jardiance then it would be 15.00 a month.  The fax number to the [facility] pharmacy is [phone]. The RNCM will collaborate with the pcp and patient concerning the information received from the help of the pharmacist at [place].  The patient wants to call Kristi at the [facility] and ask questions before agreeing to switch medications over. Will alert Dr. Wynetta Emery of the current findings and the patient will also call RNCM back with his decision. The patient states he does have some jardiance and Ozempic on hand, but will run out and needs to have medications he can take. Will continue to monitor. 03-12-2021: Incoming call from the patient asking the RNCM to let Dr. Wynetta Emery know he would like the Trulicity sent to [facility] and if she desires to change the Jardiance to Fargixa to please send this to [facility] as well. The patient will work with Drue Dun at [place] to get these medications. Secure in-basket message sent to the the pcp with patients request. Will continue to monitor and follow accordingly.        Reviewed prescribed diet with patient heart healthy/ADA diet. 03-11-2021: The patient is compliant with heart healthy diet ; Counseled on importance of regular laboratory monitoring as prescribed. 03-11-2021: The patient has regular lab test;        Discussed plans with patient for ongoing care management follow up and provided patient with direct contact  information for care management team;      Provided patient with written educational materials related to hypo and hyperglycemia and importance of correct treatment.  01-07-2021: The patient denies any issues with hypo and hyperglycemia. States that the lowest he has seen is 81.  Denies any issues with hypoglycemia at this time. 03-11-2021: The patient is not checking his blood sugars. Encouraged the patient to check his blood sugars.       Reviewed scheduled/upcoming provider appointments including: 03-02-2021 at 1020 am;         Advised patient, providing education and rationale, to check cbg daily and record. 01-07-2021: States that he has not been taking his blood sugar reading consistently.  The patient states that his blood sugars have been 81 to 130 that was back the first of December. Education on checking blood sugars at least daily or several times a week.       call provider for findings  outside established parameters;       Review of patient status, including review of consultants reports, relevant laboratory and other test results, and medications completed;       Screening for signs and symptoms of depression related to chronic disease state;        Assessed social determinant of health barriers;         Hyperlipidemia:  (Status: Goal on track: NO.) Lab Results  Component Value Date   CHOL 109 03/02/2021   HDL 29 (L) 03/02/2021   LDLCALC 53 03/02/2021   TRIG 157 (H) 03/02/2021   CHOLHDL 6.5 (H) 02/09/2017     Medication review performed; medication list updated in electronic medical record. 03-11-2021: The patient is taking Lipitor 80 mg daily as prescribed.  Provider established cholesterol goals reviewed. 03-11-2021: Education and support given. Review of heart healthy/ADA diet.  Counseled on importance of regular laboratory monitoring as prescribed; Provided HLD educational materials; Reviewed role and benefits of statin for ASCVD risk reduction; Discussed strategies to manage  statin-induced myalgias; Reviewed importance of limiting foods high in cholesterol; Reviewed exercise goals and target of 150 minutes per week; Screening for signs and symptoms of depression related to chronic disease state;  Assessed social determinant of health barriers;   Hypertension: (Status: Goal on track: YES.) Last practice recorded BP readings:  BP Readings from Last 3 Encounters:  03/02/21 110/75  09/02/20 135/77  08/28/20 102/64  Most recent eGFR/CrCl:  Lab Results  Component Value Date   EGFR 104 08/28/2020    No components found for: CRCL  Evaluation of current treatment plan related to hypertension self management and patient's adherence to plan as established by provider. 03-11-2021: The patient denies any concerns with HTN and heart health. The patient is concerned about his mother and expressed how she has a lot of things going on and he is worried about her. Reflective listening and support given. Review of resources and to be patient with his parent ;   Provided education to patient re: stroke prevention, s/s of heart attack and stroke; Reviewed prescribed diet heart healthy/ADA  Reviewed medications with patient and discussed importance of compliance. 03-11-2021: The patient is compliant with medications for HTN health;  Counseled on adverse effects of illicit drug and excessive alcohol use in patients with high blood pressure;  Discussed plans with patient for ongoing care management follow up and provided patient with direct contact information for care management team; Advised patient, providing education and rationale, to monitor blood pressure daily and record, calling PCP for findings outside established parameters;  Reviewed scheduled/upcoming provider appointments including:  Provided education on prescribed diet heart healthy/ADA;  Discussed complications of poorly controlled blood pressure such as heart disease, stroke, circulatory complications, vision  complications, kidney impairment, sexual dysfunction;   Patient Goals/Self-Care Activities: Patient will self administer medications as prescribed as evidenced by self report/primary caregiver report  Patient will attend all scheduled provider appointments as evidenced by clinician review of documented attendance to scheduled appointments and patient/caregiver report Patient will call pharmacy for medication refills as evidenced by patient report and review of pharmacy fill history as appropriate Patient will attend church or other social activities as evidenced by patient report Patient will continue to perform ADL's independently as evidenced by patient/caregiver report Patient will continue to perform IADL's independently as evidenced by patient/caregiver report Patient will call provider office for new concerns or questions as evidenced by review of documented incoming telephone call notes and patient report Patient will work  with BSW to address care coordination needs and will continue to work with the clinical team to address health care and disease management related needs as evidenced by documented adherence to scheduled care management/care coordination appointments - check blood pressure 3 times per week - check blood pressure daily - check blood pressure weekly - choose a place to take my blood pressure (home, clinic or office, retail store) - learn about high blood pressure - keep a blood pressure log - take blood pressure log to all doctor appointments - call doctor for signs and symptoms of high blood pressure - develop an action plan for high blood pressure - keep all doctor appointments - take medications for blood pressure exactly as prescribed - begin an exercise program - report new symptoms to your doctor - eat more whole grains, fruits and vegetables, lean meats and healthy fats - call for medicine refill 2 or 3 days before it runs out - take all medications exactly as  prescribed - call doctor with any symptoms you believe are related to your medicine - call doctor when you experience any new symptoms - go to all doctor appointments as scheduled - adhere to prescribed diet: Heart Healthy/ADA diet       Plan: The care management team will reach out to the patient again over the next 30 to 60  days.  Noreene Larsson RN, MSN, [facility] Mobile: [phone]

## 2021-03-13 ENCOUNTER — Other Ambulatory Visit: Payer: Self-pay

## 2021-03-18 ENCOUNTER — Other Ambulatory Visit: Payer: Self-pay

## 2021-03-20 ENCOUNTER — Other Ambulatory Visit: Payer: Self-pay

## 2021-03-20 ENCOUNTER — Ambulatory Visit: Payer: Self-pay

## 2021-03-20 DIAGNOSIS — E1122 Type 2 diabetes mellitus with diabetic chronic kidney disease: Secondary | ICD-10-CM

## 2021-03-20 DIAGNOSIS — N183 Chronic kidney disease, stage 3 unspecified: Secondary | ICD-10-CM

## 2021-03-20 NOTE — Patient Instructions (Signed)
Visit Information  Thank you for taking time to visit with me today. Please don't hesitate to contact me if I can be of assistance to you before our next scheduled telephone appointment.  Following are the goals we discussed today:  RNCM Clinical Goal(s):  Patient will verbalize understanding of plan for management of HTN, HLD, and DMII  verbalize basic understanding of HTN, HLD, and DMII disease process and self health management plan  take all medications exactly as prescribed and will call provider for medication related questions demonstrate understanding of rationale for each prescribed medication  attend all scheduled medical appointments: 09-02-2021 demonstrate improved and ongoing adherence to prescribed treatment plan for HTN, HLD, and DMII as evidenced by daily monitoring and recording of CBG  adherence to ADA/ carb modified diet adherence to prescribed medication regimen contacting provider for new or worsened symptoms or questions  demonstrate improved and ongoing health management independence  demonstrate a decrease in HTN, HLD, and DMII exacerbations  demonstrate ongoing self health care management ability effective management of chronic diseases through collaboration with RN Care manager, provider, and care team.    Interventions: 1:1 collaboration with primary care provider regarding development and update of comprehensive plan of care as evidenced by provider attestation and co-signature Inter-disciplinary care team collaboration (see longitudinal plan of care) Evaluation of current treatment plan related to  self management and patient's adherence to plan as established by provider     SDOH Barriers (Status: Goal on track: YES.)  Patient interviewed and SDOH assessment performed        Patient interviewed and appropriate assessments performed Provided patient with information about resources for [facility] and care guides to assist with any new SDOH needs Discussed  plans with patient for ongoing care management follow up and provided patient with direct contact information for care management team Advised patient to call the office for changes in [place], questions, and concerns       Diabetes:  (Status: Goal on track: YES.)      Lab Results  Component Value Date    HGBA1C 6.6 (H) 03/02/2021  Assessed patient's understanding of A1c goal: <7% Provided education to patient about basic DM disease process; Reviewed medications with patient and discussed importance of medication adherence. 01-07-2021: The patient is compliant with medications. 03-11-2021: The patient is having issues with affordability of medications. Call made to [facility] today and talked to the pharmacist at [phone]. Was directed to call Drue Dun at the [facility] outpatient pharmacy at [phone]. Spoke with Kirsitie who advised about shortage of trulicity 3 mg but 1.5 mg being available. The patient can get a coupon and get the Trulicity 1.5 mg for $18.84 and a coupon for Jardiance at [place] is assisting the [facility] with calling [facility] and seeing if an e-voucher can be sent to [facility] so the patient can get the needed medications.  Drue Dun is to call the RNCM back to collaborate on the best way to help the patient to obtain the needed. Drue Dun returned the call and the patient even with the coupon would still have to pay 169.00 for jardiance. The pharm D at [facility] outpatient pharmacy states if the provider will write for Farxiga instead of Jardiance then it would be 15.00 a month.  The fax number to the [facility] pharmacy is [phone]. The RNCM will collaborate with the pcp and patient concerning the information received from the help of the pharmacist at [place].  The patient wants to call Kristi at the [facility]  and ask questions before agreeing to switch medications over. Will alert Dr. Wynetta Emery of the current findings and the patient will also call RNCM back with his decision. The patient states he does have  some jardiance and Ozempic on hand, but will run out and needs to have medications he can take. Will continue to monitor. 03-12-2021: Incoming call from the patient asking the RNCM to let Dr. Wynetta Emery know he would like the Trulicity sent to [facility] and if she desires to change the Jardiance to [place]ixa to please send this to [facility] as well. The patient will work with Drue Dun at [place] to get these medications. Secure in-basket message sent to the the pcp with patients request. Will continue to monitor and follow accordingly.  03-20-2021: Incoming call from the patient asking for assistance for medications. The patient states he has changed his mind and wants [facility] the [facility] and not the [facility] outpatient pharmacy. Drue Dun has agreed to continue to help him with his request.  The script for the Trulicity is there, but if Dr. Wynetta Emery desires she will need to change the Jardiance to [place] and send a new script for the [place]. The patient cannot afford the Jardiance. Will send an in basket message and ask Dr. Durenda Age recommendations.       Reviewed prescribed diet with patient heart healthy/ADA diet. 03-11-2021: The patient is compliant with heart healthy diet ; Counseled on importance of regular laboratory monitoring as prescribed. 03-11-2021: The patient has regular lab test;        Discussed plans with patient for ongoing care management follow up and provided patient with direct contact information for care management team;      Provided patient with written educational materials related to hypo and hyperglycemia and importance of correct treatment.  01-07-2021: The patient denies any issues with hypo and hyperglycemia. States that the lowest he has seen is 81.  Denies any issues with hypoglycemia at this time. 03-11-2021: The patient is not checking his blood sugars. Encouraged the patient to check his blood sugars.       Reviewed scheduled/upcoming provider appointments including: 03-02-2021 at 1020  am;         Advised patient, providing education and rationale, to check cbg daily and record. 01-07-2021: States that he has not been taking his blood sugar reading consistently.  The patient states that his blood sugars have been 81 to 130 that was back the first of December. Education on checking blood sugars at least daily or several times a week.       call provider for findings outside established parameters;       Review of patient status, including review of consultants reports, relevant laboratory and other test results, and medications completed;       Screening for signs and symptoms of depression related to chronic disease state;        Assessed social determinant of health barriers;          Hyperlipidemia:  (Status: Goal on track: NO.)      Lab Results  Component Value Date    CHOL 109 03/02/2021    HDL 29 (L) 03/02/2021    LDLCALC 53 03/02/2021    TRIG 157 (H) 03/02/2021    CHOLHDL 6.5 (H) 02/09/2017      Medication review performed; medication list updated in electronic medical record. 03-11-2021: The patient is taking Lipitor 80 mg daily as prescribed.  Provider established cholesterol goals reviewed. 03-11-2021: Education and  support given. Review of heart healthy/ADA diet.  Counseled on importance of regular laboratory monitoring as prescribed; Provided HLD educational materials; Reviewed role and benefits of statin for ASCVD risk reduction; Discussed strategies to manage statin-induced myalgias; Reviewed importance of limiting foods high in cholesterol; Reviewed exercise goals and target of 150 minutes per week; Screening for signs and symptoms of depression related to chronic disease state;  Assessed social determinant of health barriers;    Hypertension: (Status: Goal on track: YES.) Last practice recorded BP readings:     BP Readings from Last 3 Encounters:  03/02/21 110/75  09/02/20 135/77  08/28/20 102/64  Most recent eGFR/CrCl:       Lab Results   Component Value Date    EGFR 104 08/28/2020    No components found for: CRCL   Evaluation of current treatment plan related to hypertension self management and patient's adherence to plan as established by provider. 03-11-2021: The patient denies any concerns with HTN and heart health. The patient is concerned about his mother and expressed how she has a lot of things going on and he is worried about her. Reflective listening and support given. Review of resources and to be patient with his parent ;   Provided education to patient re: stroke prevention, s/s of heart attack and stroke; Reviewed prescribed diet heart healthy/ADA  Reviewed medications with patient and discussed importance of compliance. 03-11-2021: The patient is compliant with medications for HTN health;  Counseled on adverse effects of illicit drug and excessive alcohol use in patients with high blood pressure;  Discussed plans with patient for ongoing care management follow up and provided patient with direct contact information for care management team; Advised patient, providing education and rationale, to monitor blood pressure daily and record, calling PCP for findings outside established parameters;  Reviewed scheduled/upcoming provider appointments including:  Provided education on prescribed diet heart healthy/ADA;  Discussed complications of poorly controlled blood pressure such as heart disease, stroke, circulatory complications, vision complications, kidney impairment, sexual dysfunction;    Patient Goals/Self-Care Activities: Patient will self administer medications as prescribed as evidenced by self report/primary caregiver report  Patient will attend all scheduled provider appointments as evidenced by clinician review of documented attendance to scheduled appointments and patient/caregiver report Patient will call pharmacy for medication refills as evidenced by patient report and review of pharmacy fill history as  appropriate Patient will attend church or other social activities as evidenced by patient report Patient will continue to perform ADL's independently as evidenced by patient/caregiver report Patient will continue to perform IADL's independently as evidenced by patient/caregiver report Patient will call provider office for new concerns or questions as evidenced by review of documented incoming telephone call notes and patient report Patient will work with BSW to address care coordination needs and will continue to work with the clinical team to address health care and disease management related needs as evidenced by documented adherence to scheduled care management/care coordination appointments - check blood pressure 3 times per week - check blood pressure daily - check blood pressure weekly - choose a place to take my blood pressure (home, clinic or office, retail store) - learn about high blood pressure - keep a blood pressure log - take blood pressure log to all doctor appointments - call doctor for signs and symptoms of high blood pressure - develop an action plan for high blood pressure - keep all doctor appointments - take medications for blood pressure exactly as prescribed - begin an exercise program -  report new symptoms to your doctor - eat more whole grains, fruits and vegetables, lean meats and healthy fats - call for medicine refill 2 or 3 days before it runs out - take all medications exactly as prescribed - call doctor with any symptoms you believe are related to your medicine - call doctor when you experience any new symptoms - go to all doctor appointments as scheduled - adhere to prescribed diet: Heart Healthy/ADA diet    Our next appointment is by telephone as scheduled  Please call the care guide team at [phone] if you need to cancel or reschedule your appointment.   If you are experiencing a Mental Health or [place] or need someone to talk  to, please call the Suicide and Crisis Lifeline: 988 call the [place] National Suicide Prevention Lifeline: (863)625-0511 or TTY: (980) 323-2953 TTY 4237861387) to talk to a trained counselor call 1-800-273-TALK (toll free, 24 hour hotline)   The patient verbalized understanding of instructions, educational materials, and care plan provided today and declined offer to receive copy of patient instructions, educational materials, and care plan.   Telephone follow up appointment with care management team member scheduled for:as scheduled  Noreene Larsson RN, MSN, [facility] Mobile: [phone]

## 2021-03-20 NOTE — Chronic Care Management (AMB) (Signed)
Care Management    RN Visit Note  03/20/2021 Name: Joshua Lee MRN: [id number] DOB: Jun 04, 1965  Subjective: Joshua Lee is a 56 y.o. year old male who is a primary care patient of Joshua Roys, DO. The care management team was consulted for assistance with disease management and care coordination needs.    Engaged with patient by telephone for follow up visit in response to provider referral for case management and/or care coordination services.   Consent to Services:   Joshua Lee was given information about Care Management services today including:  Care Management services includes personalized support from designated clinical staff supervised by his physician, including individualized plan of care and coordination with other care providers 24/7 contact phone numbers for assistance for urgent and routine care needs. The patient may stop case management services at any time by phone call to the office staff.  Patient agreed to services and consent obtained.   Assessment: Review of patient past medical history, allergies, medications, health status, including review of consultants reports, laboratory and other test data, was performed as part of comprehensive evaluation and provision of chronic care management services.   SDOH (Social Determinants of Health) assessments and interventions performed:    Care Plan  Allergies  Allergen Reactions   Penicillins Rash    Has patient had a PCN reaction causing immediate rash, facial/tongue/throat swelling, SOB or lightheadedness with hypotension: unknown Has patient had a PCN reaction causing severe rash involving mucus membranes or skin necrosis: unknown Has patient had a PCN reaction that required hospitalization: unknown Has patient had a PCN reaction occurring within the last 10 years: no If all of the above answers are "NO", then may proceed with Cephalosporin use.     Outpatient Encounter Medications as of 03/20/2021   Medication Sig   acetaminophen (TYLENOL) 650 MG CR tablet Take 650-1,950 mg by mouth every 8 (eight) hours as needed for pain.   amLODipine (NORVASC) 5 MG tablet Take 1 tablet (5 mg total) by mouth daily.   aspirin 81 MG tablet Take 81 mg by mouth daily.   atorvastatin (LIPITOR) 80 MG tablet Take 1 tablet (80 mg total) by mouth at bedtime.   benazepril (LOTENSIN) 40 MG tablet Take 1 tablet (40 mg total) by mouth daily.   calcium carbonate (TUMS EX) 750 MG chewable tablet Chew 2 tablets by mouth daily as needed for heartburn.    cetirizine (ZYRTEC) 10 MG tablet Take 10 mg by mouth daily as needed for allergies.  (Patient not taking: Reported on 03/02/2021)   chlorhexidine (PERIDEX) 0.12 % solution SMARTSIG:0.5 Ounce(s) By Mouth Morning-Night   Coenzyme Q-10 100 MG capsule Take 100 mg by mouth daily.   CONTOUR NEXT TEST test strip USE AS DIRECTED   Dulaglutide (TRULICITY) 1.5 YI/5.0YD SOPN Inject 1.5 mg into the skin once a week.   Dulaglutide (TRULICITY) 3 XA/1.2IN SOPN Inject 3 mg as directed once a week.   empagliflozin (JARDIANCE) 25 MG TABS tablet TAKE 1 TABLET(25 MG) BY MOUTH DAILY   fluticasone (FLONASE) 50 MCG/ACT nasal spray Place 1 spray into both nostrils 2 (two) times daily.   Garlic 8676 MG CAPS Take 1,000 mg by mouth daily.   metFORMIN (GLUCOPHAGE) 500 MG tablet TAKE 2 TABLETS(1000 MG) BY MOUTH TWICE DAILY WITH A MEAL   metoprolol tartrate (LOPRESSOR) 100 MG tablet Take 1 tablet (100 mg total) by mouth 2 (two) times daily.   Potassium Citrate 15 MEQ (1620 MG) TBCR Take 15 mEq by  mouth 2 (two) times daily. 2 tab QAM, 1 tab QPM   tamsulosin (FLOMAX) 0.4 MG CAPS capsule TAKE 1 CAPSULE(0.4 MG) BY MOUTH DAILY   tolnaftate (TINACTIN) 1 % spray Apply 1 application topically as needed.   VASCEPA 1 g capsule Take 2 capsules (2 g total) by mouth 2 (two) times daily.   No facility-administered encounter medications on file as of 03/20/2021.    Patient Active Problem List   Diagnosis Date  Noted   Type 2 diabetes mellitus with stage 3 chronic kidney disease, without long-term current use of insulin ([place]) 03/06/2019   Morbid obesity ([place]) 11/10/2017   CKD (chronic kidney disease), stage II 03/16/2016   Plantar fasciitis 04/17/2015   Osteoarthritis of both feet 03/05/2015   Nephrolithiasis 12/02/2014   Hypertension 07/02/2014   Hyperlipidemia 07/02/2014   Obesity, diabetes, and hypertension syndrome ([place]) 07/02/2014   Difficult airway for intubation 05/09/2014    Conditions to be addressed/monitored: DMII  Care Plan : RNCM: General Plan of Care (Adult) for Chronic Disease Management and Care Coordination needs  Updates made by Joshua Ingles, RN since 03/20/2021 12:00 AM     Problem: RNCM: Development of plan of care for Chronic Disease Management and Care Coordination Needs (HTN, HLD, DM)   Priority: High     Long-Range Goal: RNCM: Effective management  of plan of care for Chronic Disease Management and Care Coordination Needs (HTN, HLD, DM)   Start Date: 11/07/2020  Expected End Date: 11/07/2021  Priority: High  Note:   Current Barriers:  Knowledge Deficits related to plan of care for management of HTN, HLD, and DMII  Chronic Disease Management support and education needs related to HTN, HLD, and DMII Lacks caregiver support.   RNCM Clinical Goal(s):  Patient will verbalize understanding of plan for management of HTN, HLD, and DMII  verbalize basic understanding of HTN, HLD, and DMII disease process and self health management plan  take all medications exactly as prescribed and will call provider for medication related questions demonstrate understanding of rationale for each prescribed medication  attend all scheduled medical appointments: 09-02-2021 demonstrate improved and ongoing adherence to prescribed treatment plan for HTN, HLD, and DMII as evidenced by daily monitoring and recording of CBG  adherence to ADA/ carb modified diet adherence to prescribed  medication regimen contacting provider for new or worsened symptoms or questions  demonstrate improved and ongoing health management independence  demonstrate a decrease in HTN, HLD, and DMII exacerbations  demonstrate ongoing self health care management ability effective management of chronic diseases through collaboration with RN Care manager, provider, and care team.   Interventions: 1:1 collaboration with primary care provider regarding development and update of comprehensive plan of care as evidenced by provider attestation and co-signature Inter-disciplinary care team collaboration (see longitudinal plan of care) Evaluation of current treatment plan related to  self management and patient's adherence to plan as established by provider   SDOH Barriers (Status: Goal on track: YES.)  Patient interviewed and SDOH assessment performed        Patient interviewed and appropriate assessments performed Provided patient with information about resources for [facility] and care guides to assist with any new SDOH needs Discussed plans with patient for ongoing care management follow up and provided patient with direct contact information for care management team Advised patient to call the office for changes in [place], questions, and concerns    Diabetes:  (Status: Goal on track: YES.) Lab Results  Component Value Date  HGBA1C 6.6 (H) 03/02/2021  Assessed patient's understanding of A1c goal: <7% Provided education to patient about basic DM disease process; Reviewed medications with patient and discussed importance of medication adherence. 01-07-2021: The patient is compliant with medications. 03-11-2021: The patient is having issues with affordability of medications. Call made to [facility] today and talked to the pharmacist at [phone]. Was directed to call Drue Dun at the [facility] outpatient pharmacy at [phone]. Spoke with [place] who advised about shortage of trulicity 3 mg but 1.5 mg being  available. The patient can get a coupon and get the Trulicity 1.5 mg for $96.29 and a coupon for Jardiance at [place] is assisting the [facility] with [facility] and seeing if an [facility] can [facility] so the patient can get the needed medications.  Drue Dun is to call the RNCM back to collaborate on the best way to help the patient to obtain the needed. Drue Dun returned the call and the patient even with the coupon would still have to pay 169.00 for jardiance. The pharm D at [facility] outpatient pharmacy states if the provider will write for Farxiga instead of Jardiance then it would be 15.00 a month.  The fax number to the [facility] pharmacy is [phone]. The RNCM will collaborate with the pcp and patient concerning the information received from the help of the pharmacist at [place].  The patient wants to call Kristi at the [facility] and ask questions before agreeing to switch medications over. Will alert Dr. Wynetta Emery of the current findings and the patient will also call RNCM back with his decision. The patient states he does have some jardiance and Ozempic on hand, but will run out and needs to have medications he can take. Will continue to monitor. 03-12-2021: Incoming call from the patient asking the RNCM to let Dr. Wynetta Emery know he would like the Trulicity sent to [facility] and if she desires to change the Jardiance to [place] to please send this to [facility] as well. The patient will work with Drue Dun at [place] to get these medications. Secure in-basket message sent to the the pcp with patients request. Will continue to monitor and follow accordingly.  03-20-2021: Incoming call from the patient asking for assistance for medications. The patient states he has changed his mind and [facility] not [facility] outpatient pharmacy. Drue Dun has agreed to continue to help him with his request.  The script for the Trulicity is there, but if Dr. Wynetta Emery desires she will need to change the Jardiance  to [place] and send a new script for the [place]. The patient cannot afford the Jardiance. Will send an in basket message and ask Dr. Durenda Age recommendations.       Reviewed prescribed diet with patient heart healthy/ADA diet. 03-11-2021: The patient is compliant with heart healthy diet ; Counseled on importance of regular laboratory monitoring as prescribed. 03-11-2021: The patient has regular lab test;        Discussed plans with patient for ongoing care management follow up and provided patient with direct contact information for care management team;      Provided patient with written educational materials related to hypo and hyperglycemia and importance of correct treatment.  01-07-2021: The patient denies any issues with hypo and hyperglycemia. States that the lowest he has seen is 81.  Denies any issues with hypoglycemia at this time. 03-11-2021: The patient is not checking his blood sugars. Encouraged the patient to check his blood sugars.  Reviewed scheduled/upcoming provider appointments including: 03-02-2021 at 1020 am;         Advised patient, providing education and rationale, to check cbg daily and record. 01-07-2021: States that he has not been taking his blood sugar reading consistently.  The patient states that his blood sugars have been 81 to 130 that was back the first of December. Education on checking blood sugars at least daily or several times a week.       call provider for findings outside established parameters;       Review of patient status, including review of consultants reports, relevant laboratory and other test results, and medications completed;       Screening for signs and symptoms of depression related to chronic disease state;        Assessed social determinant of health barriers;         Hyperlipidemia:  (Status: Goal on track: NO.) Lab Results  Component Value Date   CHOL 109 03/02/2021   HDL 29 (L) 03/02/2021   LDLCALC 53 03/02/2021   TRIG 157 (H)  03/02/2021   CHOLHDL 6.5 (H) 02/09/2017     Medication review performed; medication list updated in electronic medical record. 03-11-2021: The patient is taking Lipitor 80 mg daily as prescribed.  Provider established cholesterol goals reviewed. 03-11-2021: Education and support given. Review of heart healthy/ADA diet.  Counseled on importance of regular laboratory monitoring as prescribed; Provided HLD educational materials; Reviewed role and benefits of statin for ASCVD risk reduction; Discussed strategies to manage statin-induced myalgias; Reviewed importance of limiting foods high in cholesterol; Reviewed exercise goals and target of 150 minutes per week; Screening for signs and symptoms of depression related to chronic disease state;  Assessed social determinant of health barriers;   Hypertension: (Status: Goal on track: YES.) Last practice recorded BP readings:  BP Readings from Last 3 Encounters:  03/02/21 110/75  09/02/20 135/77  08/28/20 102/64  Most recent eGFR/CrCl:  Lab Results  Component Value Date   EGFR 104 08/28/2020    No components found for: CRCL  Evaluation of current treatment plan related to hypertension self management and patient's adherence to plan as established by provider. 03-11-2021: The patient denies any concerns with HTN and heart health. The patient is concerned about his mother and expressed how she has a lot of things going on and he is worried about her. Reflective listening and support given. Review of resources and to be patient with his parent ;   Provided education to patient re: stroke prevention, s/s of heart attack and stroke; Reviewed prescribed diet heart healthy/ADA  Reviewed medications with patient and discussed importance of compliance. 03-11-2021: The patient is compliant with medications for HTN health;  Counseled on adverse effects of illicit drug and excessive alcohol use in patients with high blood pressure;  Discussed plans with  patient for ongoing care management follow up and provided patient with direct contact information for care management team; Advised patient, providing education and rationale, to monitor blood pressure daily and record, calling PCP for findings outside established parameters;  Reviewed scheduled/upcoming provider appointments including:  Provided education on prescribed diet heart healthy/ADA;  Discussed complications of poorly controlled blood pressure such as heart disease, stroke, circulatory complications, vision complications, kidney impairment, sexual dysfunction;   Patient Goals/Self-Care Activities: Patient will self administer medications as prescribed as evidenced by self report/primary caregiver report  Patient will attend all scheduled provider appointments as evidenced by clinician review of documented attendance to scheduled appointments and  patient/caregiver report Patient will call pharmacy for medication refills as evidenced by patient report and review of pharmacy fill history as appropriate Patient will attend church or other social activities as evidenced by patient report Patient will continue to perform ADL's independently as evidenced by patient/caregiver report Patient will continue to perform IADL's independently as evidenced by patient/caregiver report Patient will call provider office for new concerns or questions as evidenced by review of documented incoming telephone call notes and patient report Patient will work with BSW to address care coordination needs and will continue to work with the clinical team to address health care and disease management related needs as evidenced by documented adherence to scheduled care management/care coordination appointments - check blood pressure 3 times per week - check blood pressure daily - check blood pressure weekly - choose a place to take my blood pressure (home, clinic or office, retail store) - learn about high blood  pressure - keep a blood pressure log - take blood pressure log to all doctor appointments - call doctor for signs and symptoms of high blood pressure - develop an action plan for high blood pressure - keep all doctor appointments - take medications for blood pressure exactly as prescribed - begin an exercise program - report new symptoms to your doctor - eat more whole grains, fruits and vegetables, lean meats and healthy fats - call for medicine refill 2 or 3 days before it runs out - take all medications exactly as prescribed - call doctor with any symptoms you believe are related to your medicine - call doctor when you experience any new symptoms - go to all doctor appointments as scheduled - adhere to prescribed diet: Heart Healthy/ADA diet       Plan: The care management team will reach out to the patient again over the next 30 to 60  days.  Noreene Larsson RN, MSN, [facility] Mobile: [phone]

## 2021-03-23 MED ORDER — DAPAGLIFLOZIN PROPANEDIOL 10 MG PO TABS: 10.0000 mg | ORAL_TABLET | Freq: Every day | ORAL | 3 refills | Status: DC

## 2021-03-23 NOTE — Addendum Note (Signed)
Addended by: [name] on: 03/23/2021 12:02 PM ? ? Modules accepted: Orders ? ?

## 2021-03-24 ENCOUNTER — Other Ambulatory Visit: Payer: Self-pay | Admitting: Family Medicine

## 2021-03-24 NOTE — Telephone Encounter (Signed)
Flomax refill refused [facility].    ?

## 2021-03-24 NOTE — Telephone Encounter (Signed)
Requested Prescriptions  ?Refused Prescriptions Disp Refills  ?? tamsulosin (FLOMAX) 0.4 MG CAPS capsule [Pharmacy Med Name: TAMSULOSIN 0.4MG  CAPSULES] 90 capsule 1  ?  Sig: TAKE 1 CAPSULE(0.4 MG) BY MOUTH DAILY  ?  ? Urology: Alpha-Adrenergic Blocker Passed - 03/24/2021  3:29 AM  ?  ?  Passed - PSA in normal range and within 360 days  ?  Prostate Specific Ag, Serum  ?Date Value Ref Range Status  ?08/28/2020 0.4 0.0 - 4.0 ng/mL Final  ?  Comment:  ?  Roche ECLIA methodology. ?According to the American Urological Association, Serum PSA should ?decrease and remain at undetectable levels after radical ?prostatectomy. The AUA defines biochemical recurrence as an initial ?PSA value 0.2 ng/mL or greater followed by a subsequent confirmatory ?PSA value 0.2 ng/mL or greater. ?Values obtained with different assay methods or kits cannot be used ?interchangeably. Results cannot be interpreted as absolute evidence ?of the presence or absence of malignant disease. ?  ?   ?  ?  Passed - Last BP in normal range  ?  BP Readings from Last 1 Encounters:  ?03/02/21 110/75  ?   ?  ?  Passed - Valid encounter within last 12 months  ?  Recent Outpatient Visits   ?      ? 3 weeks ago Primary hypertension  ? [place], DO  ? 6 months ago Routine general medical examination at a health care facility  ? [place] P, DO  ? 9 months ago Abdominal pain, unspecified abdominal location  ? [facility] Vigg, [place], MD  ? 1 year ago Type 2 diabetes mellitus with stage 3 chronic kidney disease, without long-term current use of insulin, unspecified whether stage 3a or 3b CKD ([place])  ? [place], [name], DO  ? 1 year ago Type 2 diabetes mellitus with stage 3 chronic kidney disease, without long-term current use of insulin, unspecified whether stage 3a or 3b CKD ([place])  ? [place], [place] P, DO  ?  ?  ?Future Appointments   ?        ?  In 5 months Wynetta Emery, Barb Merino, DO [facility], [facility]  ?  ? ?  ?  ?  ? ?

## 2021-03-24 NOTE — Telephone Encounter (Signed)
This patient is not a patient here in this office. ?

## 2021-03-25 NOTE — Telephone Encounter (Signed)
I mistakenly sent this to the wrong practice.    For [facility].   I have refused the Fl[facility].   ?

## 2021-04-02 DIAGNOSIS — E119 Type 2 diabetes mellitus without complications: Secondary | ICD-10-CM | POA: Diagnosis not present

## 2021-04-02 DIAGNOSIS — I2510 Atherosclerotic heart disease of native coronary artery without angina pectoris: Secondary | ICD-10-CM | POA: Diagnosis not present

## 2021-04-02 DIAGNOSIS — I10 Essential (primary) hypertension: Secondary | ICD-10-CM | POA: Diagnosis not present

## 2021-04-02 DIAGNOSIS — E782 Mixed hyperlipidemia: Secondary | ICD-10-CM | POA: Diagnosis not present

## 2021-04-02 DIAGNOSIS — I340 Nonrheumatic mitral (valve) insufficiency: Secondary | ICD-10-CM | POA: Diagnosis not present

## 2021-04-02 DIAGNOSIS — E669 Obesity, unspecified: Secondary | ICD-10-CM | POA: Diagnosis not present

## 2021-04-03 ENCOUNTER — Ambulatory Visit: Payer: Self-pay

## 2021-04-03 ENCOUNTER — Other Ambulatory Visit: Payer: Self-pay | Admitting: Family Medicine

## 2021-04-03 ENCOUNTER — Telehealth: Payer: 59

## 2021-04-03 DIAGNOSIS — N183 Chronic kidney disease, stage 3 unspecified: Secondary | ICD-10-CM

## 2021-04-03 DIAGNOSIS — I10 Essential (primary) hypertension: Secondary | ICD-10-CM

## 2021-04-03 DIAGNOSIS — E782 Mixed hyperlipidemia: Secondary | ICD-10-CM

## 2021-04-03 NOTE — Telephone Encounter (Signed)
Requested medication (s) are due for refill today: ? ? ?Requested medication (s) are on the active medication list: No ? ?Last refill:  10/07/20 ? ?Future visit scheduled: Yes ? ?Notes to clinic:  Is pt. Still on Jardiance? ? ? ? ?Requested Prescriptions  ?Pending Prescriptions Disp Refills  ? JARDIANCE 25 MG TABS tablet [Pharmacy Med Name: JARDIANCE 25MG TABLETS] 90 tablet 1  ?  Sig: TAKE 1 TABLET(25 MG) BY MOUTH DAILY  ?  ? Endocrinology:  Diabetes - SGLT2 Inhibitors Passed - 04/03/2021  9:05 AM  ?  ?  Passed - Cr in normal range and within 360 days  ?  Creatinine, Ser  ?Date Value Ref Range Status  ?03/02/2021 0.88 0.76 - 1.27 mg/dL Final  ?  ?  ?  ?  Passed - HBA1C is between 0 and 7.9 and within 180 days  ?  Hemoglobin A1C  ?Date Value Ref Range Status  ?09/04/2015 6.9  Final  ?09/04/2015 6.9  Final  ? ?HB A1C (BAYER DCA - WAIVED)  ?Date Value Ref Range Status  ?03/02/2021 6.6 (H) 4.8 - 5.6 % Final  ?  Comment:  ?           Prediabetes: 5.7 - 6.4 ?         Diabetes: >6.4 ?         Glycemic control for adults with diabetes: <7.0 ?  ?  ?  ?  ?  Passed - eGFR in normal range and within 360 days  ?  GFR calc Af Amer  ?Date Value Ref Range Status  ?02/28/2020 114 >59 mL/min/1.73 Final  ?  Comment:  ?  **In accordance with recommendations from the NKF-ASN Task force,** ?  [facility] is in the process of updating its eGFR calculation to the ?  2021 CKD-EPI creatinine equation that estimates kidney function ?  without a race variable. ?  ? ?GFR calc non Af Amer  ?Date Value Ref Range Status  ?02/28/2020 98 >59 mL/min/1.73 Final  ? ?eGFR  ?Date Value Ref Range Status  ?03/02/2021 102 >59 mL/min/1.73 Final  ?  ?  ?  ?  Passed - Valid encounter within last 6 months  ?  Recent Outpatient Visits   ? ?      ? 1 month ago Primary hypertension  ? [place], DO  ? 7 months ago Routine general medical examination at a health care facility  ? [place] P, DO  ? 9 months ago  Abdominal pain, unspecified abdominal location  ? [facility] Vigg, Avanti, MD  ? 1 year ago Type 2 diabetes mellitus with stage 3 chronic kidney disease, without long-term current use of insulin, unspecified whether stage 3a or 3b CKD ([place])  ? [place], [name], DO  ? 1 year ago Type 2 diabetes mellitus with stage 3 chronic kidney disease, without long-term current use of insulin, unspecified whether stage 3a or 3b CKD ([place])  ? [place], [place] P, DO  ? ?  ?  ?Future Appointments   ? ?        ? In 5 months Wynetta Emery, Barb Merino, DO [facility]  ? ?  ? ?  ?  ?  ? JARDIANCE 25 MG TABS tablet [Pharmacy Med Name: JARDIANCE 25MG TABLETS] 90 tablet 1  ?  Sig: TAKE 1 TABLET(25 MG) BY MOUTH DAILY  ?  ? Endocrinology:  Diabetes -  SGLT2 Inhibitors Passed - 04/03/2021  9:05 AM  ?  ?  Passed - Cr in normal range and within 360 days  ?  Creatinine, Ser  ?Date Value Ref Range Status  ?03/02/2021 0.88 0.76 - 1.27 mg/dL Final  ?  ?  ?  ?  Passed - HBA1C is between 0 and 7.9 and within 180 days  ?  Hemoglobin A1C  ?Date Value Ref Range Status  ?09/04/2015 6.9  Final  ?09/04/2015 6.9  Final  ? ?HB A1C (BAYER DCA - WAIVED)  ?Date Value Ref Range Status  ?03/02/2021 6.6 (H) 4.8 - 5.6 % Final  ?  Comment:  ?           Prediabetes: 5.7 - 6.4 ?         Diabetes: >6.4 ?         Glycemic control for adults with diabetes: <7.0 ?  ?  ?  ?  ?  Passed - eGFR in normal range and within 360 days  ?  GFR calc Af Amer  ?Date Value Ref Range Status  ?02/28/2020 114 >59 mL/min/1.73 Final  ?  Comment:  ?  **In accordance with recommendations from the NKF-ASN Task force,** ?  [facility] is in the process of updating its eGFR calculation to the ?  2021 CKD-EPI creatinine equation that estimates kidney function ?  without a race variable. ?  ? ?GFR calc non Af Amer  ?Date Value Ref Range Status  ?02/28/2020 98 >59 mL/min/1.73 Final  ? ?eGFR  ?Date Value Ref Range Status  ?03/02/2021  102 >59 mL/min/1.73 Final  ?  ?  ?  ?  Passed - Valid encounter within last 6 months  ?  Recent Outpatient Visits   ? ?      ? 1 month ago Primary hypertension  ? [place], DO  ? 7 months ago Routine general medical examination at a health care facility  ? [place] P, DO  ? 9 months ago Abdominal pain, unspecified abdominal location  ? [facility] Vigg, Avanti, MD  ? 1 year ago Type 2 diabetes mellitus with stage 3 chronic kidney disease, without long-term current use of insulin, unspecified whether stage 3a or 3b CKD ([place])  ? [place], [name], DO  ? 1 year ago Type 2 diabetes mellitus with stage 3 chronic kidney disease, without long-term current use of insulin, unspecified whether stage 3a or 3b CKD ([place])  ? [place], [place] P, DO  ? ?  ?  ?Future Appointments   ? ?        ? In 5 months Wynetta Emery, Barb Merino, DO [facility]  ? ?  ? ?  ?  ?  ? ?

## 2021-04-03 NOTE — Patient Instructions (Signed)
Visit Information ? ?Thank you for taking time to visit with me today. Please don't hesitate to contact me if I can be of assistance to you before our next scheduled telephone appointment. ? ?Following are the goals we discussed today:  ?RNCM Clinical Goal(s):  ?Patient will verbalize understanding of plan for management of HTN, HLD, and DMII  ?verbalize basic understanding of HTN, HLD, and DMII disease process and self health management plan  ?take all medications exactly as prescribed and will call provider for medication related questions ?demonstrate understanding of rationale for each prescribed medication  ?attend all scheduled medical appointments: 09-02-2021 ?demonstrate improved and ongoing adherence to prescribed treatment plan for HTN, HLD, and DMII as evidenced by daily monitoring and recording of CBG  adherence to ADA/ carb modified diet adherence to prescribed medication regimen contacting provider for new or worsened symptoms or questions  ?demonstrate improved and ongoing health management independence  ?demonstrate a decrease in HTN, HLD, and DMII exacerbations  ?demonstrate ongoing self health care management ability effective management of chronic diseases through collaboration with RN Care manager, provider, and care team.  ?  ?Interventions: ?1:1 collaboration with primary care provider regarding development and update of comprehensive plan of care as evidenced by provider attestation and co-signature ?Inter-disciplinary care team collaboration (see longitudinal plan of care) ?Evaluation of current treatment plan related to  self management and patient's adherence to plan as established by provider ?  ?  ?SDOH Barriers (Status: Goal on track: YES.)  ?Patient interviewed and SDOH assessment performed ?       ?Patient interviewed and appropriate assessments performed ?Provided patient with information about resources for [facility] and care guides to assist with any new SDOH needs ?Discussed  plans with patient for ongoing care management follow up and provided patient with direct contact information for care management team ?Advised patient to call the office for changes in SDOH, questions, and concerns ?  ?  ?  ?Diabetes:  (Status: Goal on track: YES.) ?     ?Lab Results  ?Component Value Date  ?  HGBA1C 6.6 (H) 03/02/2021  ?Assessed patient's understanding of A1c goal: <7% ?Provided education to patient about basic DM disease process; ?Reviewed medications with patient and discussed importance of medication adherence. 01-07-2021: The patient is compliant with medications. 03-11-2021: The patient is having issues with affordability of medications. Call made to [facility] today and talked to the pharmacist at [phone]. Was directed to call Danford Bad at the [facility] outpatient pharmacy at [phone]. Spoke with Kirsitie who advised about shortage of trulicity 3 mg but 1.5 mg being available. The patient can get a coupon and get the Trulicity 1.5 mg for $25.00 and a coupon [facility] at [facility].  Danford Bad is assisting the [facility] with calling [facility] and seeing if an e-voucher can be sent to [facility] so the patient can get the needed medications.  Danford Bad is to call the RNCM back to collaborate on the best way to help the patient to obtain the needed. Danford Bad returned the call and the patient even with the coupon would still have to pay 169.00 for jardiance. The pharm D at [facility] outpatient pharmacy states if the provider will write for Farxiga instead of Jardiance then it would be 15.00 a month.  The fax number to the [facility] pharmacy is [phone]. The RNCM will collaborate with the pcp and patient concerning the information received from the help of the pharmacist at [facility] pharmacy.  The patient wants to call Kristi at the [facility]  and ask questions before agreeing to switch medications over. Will alert Dr. Laural Benes of the current findings and the patient will also call RNCM back with his decision. The patient states he does have  some jardiance and Ozempic on hand, but will run out and needs to have medications he can take. Will continue to monitor. 03-12-2021: Incoming call from the patient asking the RNCM to let Dr. Laural Benes know he would like the Trulicity sent to [facility] and if she desires to change the Jardiance to F[place]ixa to please send this to [facility] as well. The patient will work with Danford Bad at [facility] pharmacy to get these medications. Secure in-basket message sent to the the pcp with patients request. Will continue to monitor and follow accordingly.  03-20-2021: Incoming call from the patient asking for assistance for medications. The patient states he has changed his mind and wants [facility] and not the [facility] outpatient pharmacy. Danford Bad has agreed to continue to help him with his request.  The script for the Trulicity is there, but if Dr. Laural Benes desires she will need to change the Jardiance to [place] and send a new script for the [place]. The patient cannot afford the Jardiance. Will send an in basket message and ask Dr. Henriette Combs recommendations. 04-03-2021: The patient has his medications. Still has some supply of the Jardiance and Ozempic and will use this up first. The patient will come to the office to get assistance with administration of Trulicity. The patient will call the office before coming to the office to get the [facility] to assist with administration.       ?Reviewed prescribed diet with patient heart healthy/ADA diet. 04-03-2021: The patient is compliant with heart healthy diet ; ?Counseled on importance of regular laboratory monitoring as prescribed. 04-03-2021: The patient has regular lab test;        ?Discussed plans with patient for ongoing care management follow up and provided patient with direct contact information for care management team;      ?Provided patient with written educational materials related to hypo and hyperglycemia and importance of correct treatment.  01-07-2021: The patient denies any  issues with hypo and hyperglycemia. States that the lowest he has seen is 81.  Denies any issues with hypoglycemia at this time. 04-03-2021: The patient is not checking his blood sugars. Encouraged the patient to check his blood sugars.  Review today the benefits of checking blood sugars on a regular basis. The patient states he will start back checking his blood sugars.      ?Reviewed scheduled/upcoming provider appointments including: 09-02-2021         ?Advised patient, providing education and rationale, to check cbg daily and record. 01-07-2021: States that he has not been taking his blood sugar reading consistently.  The patient states that his blood sugars have been 81 to 130 that was back the first of December. Education on checking blood sugars at least daily or several times a week.  04-03-2021: Does not have any readings to give the the [facility]. Reminder given today.      ?call provider for findings outside established parameters;       ?Review of patient status, including review of consultants reports, relevant laboratory and other test results, and medications completed;       ?Screening for signs and symptoms of depression related to chronic disease state;        ?Assessed social determinant of health barriers;        ?  ?  Hyperlipidemia:  (Status: Goal on track: NO.) ?     ?Lab Results  ?Component Value Date  ?  CHOL 109 03/02/2021  ?  HDL 29 (L) 03/02/2021  ?  LDLCALC 53 03/02/2021  ?  TRIG 157 (H) 03/02/2021  ?  CHOLHDL 6.5 (H) 02/09/2017  ?  ?  ?Medication review performed; medication list updated in electronic medical record. 04-03-2021: The patient is taking Lipitor 80 mg daily as prescribed.  ?Provider established cholesterol goals reviewed. 04-03-2021: Education and support given. Review of heart healthy/ADA diet.  ?Counseled on importance of regular laboratory monitoring as prescribed; ?Provided HLD educational materials; ?Reviewed role and benefits of statin for ASCVD risk reduction; ?Discussed  strategies to manage statin-induced myalgias; ?Reviewed importance of limiting foods high in cholesterol; ?Reviewed exercise goals and target of 150 minutes per week; ?Screening for signs and symptoms of depressi

## 2021-04-03 NOTE — Chronic Care Management (AMB) (Signed)
? Care Management ?  ? RN Visit Note ? ?04/03/2021 ?Name: Joshua Lee MRN: [id number] DOB: 1965-08-01 ? ?Subjective: ?Joshua Lee is a 56 y.o. year old male who is a primary care patient of Joshua Roys, DO. The care management team was consulted for assistance with disease management and care coordination needs.   ? ?Engaged with patient by telephone for follow up visit in response to provider referral for case management and/or care coordination services.  ? ?Consent to Services:  ? Joshua Lee was given information about Care Management services today including:  ?Care Management services includes personalized support from designated clinical staff supervised by his physician, including individualized plan of care and coordination with other care providers ?24/7 contact phone numbers for assistance for urgent and routine care needs. ?The patient may stop case management services at any time by phone call to the office staff. ? ?Patient agreed to services and consent obtained.  ? ?Assessment: Review of patient past medical history, allergies, medications, health status, including review of consultants reports, laboratory and other test data, was performed as part of comprehensive evaluation and provision of chronic care management services.  ? ?SDOH (Social Determinants of Health) assessments and interventions performed:   ? ?Care Plan ? ?Allergies  ?Allergen Reactions  ? Penicillins Rash  ?  Has patient had a PCN reaction causing immediate rash, facial/tongue/throat swelling, SOB or lightheadedness with hypotension: unknown ?Has patient had a PCN reaction causing severe rash involving mucus membranes or skin necrosis: unknown ?Has patient had a PCN reaction that required hospitalization: unknown ?Has patient had a PCN reaction occurring within the last 10 years: no ?If all of the above answers are "NO", then may proceed with Cephalosporin use. ?  ? ? ?Outpatient Encounter Medications as of 04/03/2021   ?Medication Sig  ? acetaminophen (TYLENOL) 650 MG CR tablet Take 650-1,950 mg by mouth every 8 (eight) hours as needed for pain.  ? amLODipine (NORVASC) 5 MG tablet Take 1 tablet (5 mg total) by mouth daily.  ? aspirin 81 MG tablet Take 81 mg by mouth daily.  ? atorvastatin (LIPITOR) 80 MG tablet Take 1 tablet (80 mg total) by mouth at bedtime.  ? benazepril (LOTENSIN) 40 MG tablet Take 1 tablet (40 mg total) by mouth daily.  ? calcium carbonate (TUMS EX) 750 MG chewable tablet Chew 2 tablets by mouth daily as needed for heartburn.   ? cetirizine (ZYRTEC) 10 MG tablet Take 10 mg by mouth daily as needed for allergies.  (Patient not taking: Reported on 03/02/2021)  ? chlorhexidine (PERIDEX) 0.12 % solution SMARTSIG:0.5 Ounce(s) By Mouth Morning-Night  ? Coenzyme Q-10 100 MG capsule Take 100 mg by mouth daily.  ? CONTOUR NEXT TEST test strip USE AS DIRECTED  ? dapagliflozin propanediol (FARXIGA) 10 MG TABS tablet Take 1 tablet (10 mg total) by mouth daily before breakfast.  ? Dulaglutide (TRULICITY) 1.5 0000000 SOPN Inject 1.5 mg into the skin once a week.  ? Dulaglutide (TRULICITY) 3 0000000 SOPN Inject 3 mg as directed once a week.  ? fluticasone (FLONASE) 50 MCG/ACT nasal spray Place 1 spray into both nostrils 2 (two) times daily.  ? Garlic 123XX123 MG CAPS Take 1,000 mg by mouth daily.  ? metFORMIN (GLUCOPHAGE) 500 MG tablet TAKE 2 TABLETS(1000 MG) BY MOUTH TWICE DAILY WITH A MEAL  ? metoprolol tartrate (LOPRESSOR) 100 MG tablet Take 1 tablet (100 mg total) by mouth 2 (two) times daily.  ? Potassium Citrate 15 MEQ (1620 MG)  TBCR Take 15 mEq by mouth 2 (two) times daily. 2 tab QAM, 1 tab QPM  ? tamsulosin (FLOMAX) 0.4 MG CAPS capsule TAKE 1 CAPSULE(0.4 MG) BY MOUTH DAILY  ? tolnaftate (TINACTIN) 1 % spray Apply 1 application topically as needed.  ? VASCEPA 1 g capsule Take 2 capsules (2 g total) by mouth 2 (two) times daily.  ? ?No facility-administered encounter medications on file as of 04/03/2021.  ? ? ?Patient  Active Problem List  ? Diagnosis Date Noted  ? Type 2 diabetes mellitus with stage 3 chronic kidney disease, without long-term current use of insulin ([place]) 03/06/2019  ? Morbid obesity ([place]) 11/10/2017  ? CKD (chronic kidney disease), stage II 03/16/2016  ? Plantar fasciitis 04/17/2015  ? Osteoarthritis of both feet 03/05/2015  ? Nephrolithiasis 12/02/2014  ? Hypertension 07/02/2014  ? Hyperlipidemia 07/02/2014  ? Obesity, diabetes, and hypertension syndrome ([place]) 07/02/2014  ? Difficult airway for intubation 05/09/2014  ? ? ?Conditions to be addressed/monitored: HTN, HLD, and DMII ? ?Care Plan : RNCM: General Plan of Care (Adult) for Chronic Disease Management and Care Coordination needs  ?Updates made by Vanita Ingles, RN since 04/03/2021 12:00 AM  ?  ? ?Problem: RNCM: Development of plan of care for Chronic Disease Management and Care Coordination Needs (HTN, HLD, DM)   ?Priority: High  ?  ? ?Long-Range Goal: RNCM: Effective management  of plan of care for Chronic Disease Management and Care Coordination Needs (HTN, HLD, DM)   ?Start Date: 11/07/2020  ?Expected End Date: 11/07/2021  ?Priority: High  ?Note:   ?Current Barriers:  ?Knowledge Deficits related to plan of care for management of HTN, HLD, and DMII  ?Chronic Disease Management support and education needs related to HTN, HLD, and DMII ?Lacks caregiver support.  ? ?RNCM Clinical Goal(s):  ?Patient will verbalize understanding of plan for management of HTN, HLD, and DMII  ?verbalize basic understanding of HTN, HLD, and DMII disease process and self health management plan  ?take all medications exactly as prescribed and will call provider for medication related questions ?demonstrate understanding of rationale for each prescribed medication  ?attend all scheduled medical appointments: 09-02-2021 ?demonstrate improved and ongoing adherence to prescribed treatment plan for HTN, HLD, and DMII as evidenced by daily monitoring and recording of CBG  adherence to  ADA/ carb modified diet adherence to prescribed medication regimen contacting provider for new or worsened symptoms or questions  ?demonstrate improved and ongoing health management independence  ?demonstrate a decrease in HTN, HLD, and DMII exacerbations  ?demonstrate ongoing self health care management ability effective management of chronic diseases through collaboration with RN Care manager, provider, and care team.  ? ?Interventions: ?1:1 collaboration with primary care provider regarding development and update of comprehensive plan of care as evidenced by provider attestation and co-signature ?Inter-disciplinary care team collaboration (see longitudinal plan of care) ?Evaluation of current treatment plan related to  self management and patient's adherence to plan as established by provider ? ? ?SDOH Barriers (Status: Goal on track: YES.)  ?Patient interviewed and SDOH assessment performed ?       ?Patient interviewed and appropriate assessments performed ?Provided patient with information about resources for [facility] and care guides to assist with any new SDOH needs ?Discussed plans with patient for ongoing care management follow up and provided patient with direct contact information for care management team ?Advised patient to call the office for changes in SDOH, questions, and concerns ? ? ? ?Diabetes:  (Status: Goal on track: YES.) ?  Lab Results  ?Component Value Date  ? HGBA1C 6.6 (H) 03/02/2021  ?Assessed patient's understanding of A1c goal: <7% ?Provided education to patient about basic DM disease process; ?Reviewed medications with patient and discussed importance of medication adherence. 01-07-2021: The patient is compliant with medications. 03-11-2021: The patient is having issues with affordability of medications. Call made to [facility] today and talked to the pharmacist at [phone]. Was directed to call Drue Dun at the [facility] outpatient pharmacy at [phone]. Spoke with Kirsitie who advised  about shortage of trulicity 3 mg but 1.5 mg being available. The patient can get a coupon and get the Trulicity 1.5 mg for 0000000 and a coupon for Jardiance at [place] is assisting the [facility] with calling [facility]

## 2021-04-06 ENCOUNTER — Other Ambulatory Visit: Payer: Self-pay | Admitting: Family Medicine

## 2021-04-08 NOTE — Telephone Encounter (Signed)
Requested medications are due for refill today.  unsure ? ?Requested medications are on the active medications list.  unsure ? ?Last refill. unsure ? ?Future visit scheduled.   yes ? ?Notes to clinic.  Please review for refill - Med list has different dosages. Last OV lists this dosage. ? ? ? ?Requested Prescriptions  ?Pending Prescriptions Disp Refills  ? OZEMPIC, 1 MG/DOSE, 4 MG/3ML SOPN [Pharmacy Med Name: OZEMPIC 1MG  PER DOSE (1X4MG  PEN)] 9 mL 1  ?  Sig: INJECT 1 MG INTO THE SKIN ONCE A WEEK  ?  ? Endocrinology:  Diabetes - GLP-1 Receptor Agonists - semaglutide Failed - 04/06/2021 11:34 AM  ?  ?  Failed - HBA1C in normal range and within 180 days  ?  Hemoglobin A1C  ?Date Value Ref Range Status  ?09/04/2015 6.9  Final  ?09/04/2015 6.9  Final  ? ?HB A1C (BAYER DCA - WAIVED)  ?Date Value Ref Range Status  ?03/02/2021 6.6 (H) 4.8 - 5.6 % Final  ?  Comment:  ?           Prediabetes: 5.7 - 6.4 ?         Diabetes: >6.4 ?         Glycemic control for adults with diabetes: <7.0 ?  ?  ?  ?  ?  Passed - Cr in normal range and within 360 days  ?  Creatinine, Ser  ?Date Value Ref Range Status  ?03/02/2021 0.88 0.76 - 1.27 mg/dL Final  ?  ?  ?  ?  Passed - Valid encounter within last 6 months  ?  Recent Outpatient Visits   ? ?      ? 1 month ago Primary hypertension  ? [place], DO  ? 7 months ago Routine general medical examination at a health care facility  ? [place] P, DO  ? 10 months ago Abdominal pain, unspecified abdominal location  ? [facility] Vigg, Avanti, MD  ? 1 year ago Type 2 diabetes mellitus with stage 3 chronic kidney disease, without long-term current use of insulin, unspecified whether stage 3a or 3b CKD ([place])  ? [place], [name], DO  ? 1 year ago Type 2 diabetes mellitus with stage 3 chronic kidney disease, without long-term current use of insulin, unspecified whether stage 3a or 3b CKD ([place])  ? [place], [place] P, DO  ? ?  ?  ?Future Appointments   ? ?        ? In 4 months Wynetta Emery, Barb Merino, DO [facility]  ? ?  ? ?  ?  ?  ?  ?

## 2021-04-09 DIAGNOSIS — M9903 Segmental and somatic dysfunction of lumbar region: Secondary | ICD-10-CM | POA: Diagnosis not present

## 2021-04-09 DIAGNOSIS — M5416 Radiculopathy, lumbar region: Secondary | ICD-10-CM | POA: Diagnosis not present

## 2021-04-09 DIAGNOSIS — M5136 Other intervertebral disc degeneration, lumbar region: Secondary | ICD-10-CM | POA: Diagnosis not present

## 2021-04-09 DIAGNOSIS — M9905 Segmental and somatic dysfunction of pelvic region: Secondary | ICD-10-CM | POA: Diagnosis not present

## 2021-05-05 ENCOUNTER — Ambulatory Visit: Payer: Self-pay

## 2021-05-05 DIAGNOSIS — E782 Mixed hyperlipidemia: Secondary | ICD-10-CM

## 2021-05-05 DIAGNOSIS — N183 Chronic kidney disease, stage 3 unspecified: Secondary | ICD-10-CM

## 2021-05-05 NOTE — Patient Instructions (Signed)
Visit Information ? ?Thank you for taking time to visit with me today. Please don't hesitate to contact me if I can be of assistance to you before our next scheduled telephone appointment. ? ?Following are the goals we discussed today:  ?RNCM Clinical Goal(s):  ?Patient will verbalize understanding of plan for management of HTN, HLD, and DMII  ?verbalize basic understanding of HTN, HLD, and DMII disease process and self health management plan  ?take all medications exactly as prescribed and will call provider for medication related questions ?demonstrate understanding of rationale for each prescribed medication  ?attend all scheduled medical appointments: 09-02-2021 at 1020 am ?demonstrate improved and ongoing adherence to prescribed treatment plan for HTN, HLD, and DMII as evidenced by daily monitoring and recording of CBG  adherence to ADA/ carb modified diet adherence to prescribed medication regimen contacting provider for new or worsened symptoms or questions  ?demonstrate improved and ongoing health management independence  ?demonstrate a decrease in HTN, HLD, and DMII exacerbations  ?demonstrate ongoing self health care management ability effective management of chronic diseases through collaboration with RN Care manager, provider, and care team.  ?  ?Interventions: ?1:1 collaboration with primary care provider regarding development and update of comprehensive plan of care as evidenced by provider attestation and co-signature ?Inter-disciplinary care team collaboration (see longitudinal plan of care) ?Evaluation of current treatment plan related to  self management and patient's adherence to plan as established by provider ?  ?  ?SDOH Barriers (Status: Goal on track: YES.)  ?Patient interviewed and SDOH assessment performed ?       ?Patient interviewed and appropriate assessments performed ?Provided patient with information about resources for [facility] and care guides to assist with any new SDOH  needs ?Discussed plans with patient for ongoing care management follow up and provided patient with direct contact information for care management team ?Advised patient to call the office for changes in SDOH, questions, and concerns ?  ?  ?  ?Diabetes:  (Status: Goal on track: YES.) ?     ?Lab Results  ?Component Value Date  ?  HGBA1C 6.6 (H) 03/02/2021  ?Assessed patient's understanding of A1c goal: <7%. 05-05-2021: The patient is in range of A1C. Review with patient today at face to face visit the goal of A1C ?Provided education to patient about basic DM disease process. 05-05-2021: Education provided today with handouts on healthy eating choices and effective management of DM. The patient is good about asking questions and making sure he understands his options. ; ?Reviewed medications with patient and discussed importance of medication adherence. 01-07-2021: The patient is compliant with medications. 03-11-2021: The patient is having issues with affordability of medications. [facility] today and talked to the pharmacist at [phone]. Was directed to call Danford BadKristie at the [facility] pharmacy at [phone]. Spoke with Kirsitie who advised about shortage of trulicity 3 mg but 1.5 mg being available. The patient can get a coupon and get the Trulicity 1.5 mg for $25.00 and a [facility].  Danford BadKristie is assisting the [facility] and seeing if an [facility]ou[facility] can [facility] so the patient can get the needed medications.  Danford BadKristie is to call the RNCM back to collaborate on the best way to help the patient to obtain the needed. Danford BadKristie returned the call and the patient even with the coupon would still have to pay 169.00 for jardiance. The pharm D at [facility] outpatient pharmacy states if the provider will write  for Farxiga instead of Jardiance then it would be 15.00 a month.  The fax number to the [facility] pharmacy is [phone]. The RNCM will collaborate with the pcp and patient concerning  the information received from the help of the pharmacist at [facility] pharmacy.  The patient wants to call Kristi at the [facility] and ask questions before agreeing to switch medications over. Will alert Dr. Laural Benes of the current findings and the patient will also call RNCM back with his decision. The patient states he does have some jardiance and Ozempic on hand, but will run out and needs to have medications he can take. Will continue to monitor. 03-12-2021: Incoming call from the patient asking the RNCM to let Dr. Laural Benes know he would like the Trulicity sent to [facility] and if she desires to change the Jardiance to F[place]ixa to please send this to [facility] as well. The patient will work with Danford Bad at [facility] pharmacy to get these medications. Secure in-basket message sent to the the pcp with patients request. Will continue to monitor and follow accordingly.  03-20-2021: Incoming call from the patient asking for assistance for medications. The patient states he has changed his mind and wants [facility] and not [facility]. Danford Bad has agreed to continue to help him with his request.  The script for the Trulicity is there, but if Dr. Laural Benes desires she will need to change the Jardiance to [place] and send a new script for the [place]. The patient cannot afford the Jardiance. Will send an in basket message and ask Dr. Henriette Combs recommendations. 04-03-2021: The patient has his medications. Still has some supply of the Jardiance and Ozempic and will use this up first. The patient will come to the office to get assistance with administration of Trulicity. The patient will call the office before coming to the office to get the [facility] to assist with administration. 05-05-2021: The patient came to the office today for face to face visit with the [facility] for instructions and education on administering Trulicity effecively.  The patient viewed a 4 minute video on how to effectively administer the injection from the  [employer]. Then the [facility] assisted the patient with his first injection and monitored for correct process. The patient was able to ask questions and the patient felt confident in administration after. Gave the patient information on safe disposable of Trulicity pen and gave drop off for sharps disposal in [place] [facility] [phone] [place]. The patient received handouts for effective management of DM. Will continue to monitor.     ?Reviewed prescribed diet with patient heart healthy/ADA diet. 05-05-2021: The patient is compliant with heart healthy/ADA diet.  Handout provided today to the patient on healthy eating and food options ; ?Counseled on importance of regular laboratory monitoring as prescribed. 05-05-2021: The patient has regular lab test;        ?Discussed plans with patient for ongoing care management follow up and provided patient with direct contact information for care management team;      ?Provided patient with written educational materials related to hypo and hyperglycemia and importance of correct treatment.  01-07-2021: The patient denies any issues with hypo and hyperglycemia. States that the lowest he has seen is 81.  Denies any issues with hypoglycemia at this time. 04-03-2021: The patient is not checking his blood sugars. Encouraged the patient to check his blood sugars.  Review today the benefits of checking blood sugars on a regular  basis. The patient states he will start back checking his blood sugars.      ?Reviewed scheduled/upcoming provider appointments including: 09-02-2021         ?Advised patient, providing education and rationale, to check cbg daily and record. 01-07-2021: States that he has not been taking his blood sugar reading consistently.  The patient states that his blood sugars have been 81 to 130 that was back the first of December. Education on checking blood sugars at least daily or several times a week.  04-03-2021: Does not have any readings  to give the the [facility]. Reminder given today.      ?call provider for findings outside established parameters;       ?Review of patient status, including review of consultants reports, relevant laboratory and o

## 2021-05-05 NOTE — Chronic Care Management (AMB) (Signed)
? Care Management ?  ? RN Visit Note ? ?05/05/2021 ?Name: Joshua Lee MRN: [id number] DOB: [birth year] ? ?Subjective: ?Joshua Lee is a 56 y.o. year old male who is a primary care patient of [name], DO. The care management team was consulted for assistance with disease management and care coordination needs.   ? ?Engaged with patient by telephone for follow up visit in response to provider referral for case management and/or care coordination services.  ? ?Consent to Services:  ? Joshua Lee was given information about Care Management services today including:  ?Care Management services includes personalized support from designated clinical staff supervised by his physician, including individualized plan of care and coordination with other care providers ?24/7 contact phone numbers for assistance for urgent and routine care needs. ?The patient may stop case management services at any time by phone call to the office staff. ? ?Patient agreed to services and consent obtained.  ? ?Assessment: Review of patient past medical history, allergies, medications, health status, including review of consultants reports, laboratory and other test data, was performed as part of comprehensive evaluation and provision of chronic care management services.  ? ?SDOH (Social Determinants of Health) assessments and interventions performed:   ? ?Care Plan ? ?Allergies  ?Allergen Reactions  ? Penicillins Rash  ?  Has patient had a PCN reaction causing immediate rash, facial/tongue/throat swelling, SOB or lightheadedness with hypotension: unknown ?Has patient had a PCN reaction causing severe rash involving mucus membranes or skin necrosis: unknown ?Has patient had a PCN reaction that required hospitalization: unknown ?Has patient had a PCN reaction occurring within the last 10 years: no ?If all of the above answers are "NO", then may proceed with Cephalosporin use. ?  ? ? ?Outpatient Encounter Medications as of 05/05/2021   ?Medication Sig  ? acetaminophen (TYLENOL) 650 MG CR tablet Take 650-1,950 mg by mouth every 8 (eight) hours as needed for pain.  ? amLODipine (NORVASC) 5 MG tablet Take 1 tablet (5 mg total) by mouth daily.  ? aspirin 81 MG tablet Take 81 mg by mouth daily.  ? atorvastatin (LIPITOR) 80 MG tablet Take 1 tablet (80 mg total) by mouth at bedtime.  ? benazepril (LOTENSIN) 40 MG tablet Take 1 tablet (40 mg total) by mouth daily.  ? calcium carbonate (TUMS EX) 750 MG chewable tablet Chew 2 tablets by mouth daily as needed for heartburn.   ? cetirizine (ZYRTEC) 10 MG tablet Take 10 mg by mouth daily as needed for allergies.  (Patient not taking: Reported on 03/02/2021)  ? chlorhexidine (PERIDEX) 0.12 % solution SMARTSIG:0.5 Ounce(s) By Mouth Morning-Night  ? Coenzyme Q-10 100 MG capsule Take 100 mg by mouth daily.  ? CONTOUR NEXT TEST test strip USE AS DIRECTED  ? dapagliflozin propanediol (FARXIGA) 10 MG TABS tablet Take 1 tablet (10 mg total) by mouth daily before breakfast.  ? Dulaglutide (TRULICITY) 1.5 0000000 SOPN Inject 1.5 mg into the skin once a week.  ? Dulaglutide (TRULICITY) 3 0000000 SOPN Inject 3 mg as directed once a week.  ? fluticasone (FLONASE) 50 MCG/ACT nasal spray Place 1 spray into both nostrils 2 (two) times daily.  ? Garlic 123XX123 MG CAPS Take 1,000 mg by mouth daily.  ? metFORMIN (GLUCOPHAGE) 500 MG tablet TAKE 2 TABLETS(1000 MG) BY MOUTH TWICE DAILY WITH A MEAL  ? metoprolol tartrate (LOPRESSOR) 100 MG tablet Take 1 tablet (100 mg total) by mouth 2 (two) times daily.  ? Potassium Citrate 15 MEQ (1620 MG)  TBCR Take 15 mEq by mouth 2 (two) times daily. 2 tab QAM, 1 tab QPM  ? tamsulosin (FLOMAX) 0.4 MG CAPS capsule TAKE 1 CAPSULE(0.4 MG) BY MOUTH DAILY  ? tolnaftate (TINACTIN) 1 % spray Apply 1 application topically as needed.  ? VASCEPA 1 g capsule Take 2 capsules (2 g total) by mouth 2 (two) times daily.  ? ?No facility-administered encounter medications on file as of 05/05/2021.  ? ? ?Patient  Active Problem List  ? Diagnosis Date Noted  ? Type 2 diabetes mellitus with stage 3 chronic kidney disease, without long-term current use of insulin ([place]) 03/06/2019  ? Morbid obesity ([place]) 11/10/2017  ? CKD (chronic kidney disease), stage II 03/16/2016  ? Plantar fasciitis 04/17/2015  ? Osteoarthritis of both feet 03/05/2015  ? Nephrolithiasis 12/02/2014  ? Hypertension 07/02/2014  ? Hyperlipidemia 07/02/2014  ? Obesity, diabetes, and hypertension syndrome ([place]) 07/02/2014  ? Difficult airway for intubation 05/09/2014  ? ? ?Conditions to be addressed/monitored: HLD and DMII ? ?Care Plan : RNCM: General Plan of Care (Adult) for Chronic Disease Management and Care Coordination needs  ?Updates made by Vanita Ingles, RN since 05/05/2021 12:00 AM  ?  ? ?Problem: RNCM: Development of plan of care for Chronic Disease Management and Care Coordination Needs (HTN, HLD, DM)   ?Priority: High  ?  ? ?Long-Range Goal: RNCM: Effective management  of plan of care for Chronic Disease Management and Care Coordination Needs (HTN, HLD, DM)   ?Start Date: 11/07/2020  ?Expected End Date: 11/07/2021  ?Priority: High  ?Note:   ?Current Barriers:  ?Knowledge Deficits related to plan of care for management of HTN, HLD, and DMII  ?Chronic Disease Management support and education needs related to HTN, HLD, and DMII ?Lacks caregiver support.  ? ?RNCM Clinical Goal(s):  ?Patient will verbalize understanding of plan for management of HTN, HLD, and DMII  ?verbalize basic understanding of HTN, HLD, and DMII disease process and self health management plan  ?take all medications exactly as prescribed and will call provider for medication related questions ?demonstrate understanding of rationale for each prescribed medication  ?attend all scheduled medical appointments: 09-02-2021 at 1020 am ?demonstrate improved and ongoing adherence to prescribed treatment plan for HTN, HLD, and DMII as evidenced by daily monitoring and recording of CBG  adherence  to ADA/ carb modified diet adherence to prescribed medication regimen contacting provider for new or worsened symptoms or questions  ?demonstrate improved and ongoing health management independence  ?demonstrate a decrease in HTN, HLD, and DMII exacerbations  ?demonstrate ongoing self health care management ability effective management of chronic diseases through collaboration with RN Care manager, provider, and care team.  ? ?Interventions: ?1:1 collaboration with primary care provider regarding development and update of comprehensive plan of care as evidenced by provider attestation and co-signature ?Inter-disciplinary care team collaboration (see longitudinal plan of care) ?Evaluation of current treatment plan related to  self management and patient's adherence to plan as established by provider ? ? ?SDOH Barriers (Status: Goal on track: YES.)  ?Patient interviewed and SDOH assessment performed ?       ?Patient interviewed and appropriate assessments performed ?Provided patient with information about resources for [facility] and care guides to assist with any new SDOH needs ?Discussed plans with patient for ongoing care management follow up and provided patient with direct contact information for care management team ?Advised patient to call the office for changes in SDOH, questions, and concerns ? ? ? ?Diabetes:  (Status: Goal on  track: YES.) ?Lab Results  ?Component Value Date  ? HGBA1C 6.6 (H) 03/02/2021  ?Assessed patient's understanding of A1c goal: <7%. 05-05-2021: The patient is in range of A1C. Review with patient today at face to face visit the goal of A1C ?Provided education to patient about basic DM disease process. 05-05-2021: Education provided today with handouts on healthy eating choices and effective management of DM. The patient is good about asking questions and making sure he understands his options. ; ?Reviewed medications with patient and discussed importance of medication adherence.  01-07-2021: The patient is compliant with medications. 03-11-2021: The patient is having issues with affordability of medications. Call made to [facility] today and talked to the pharmacist at [phone]. Was directe

## 2021-05-07 DIAGNOSIS — M5136 Other intervertebral disc degeneration, lumbar region: Secondary | ICD-10-CM | POA: Diagnosis not present

## 2021-05-07 DIAGNOSIS — M5416 Radiculopathy, lumbar region: Secondary | ICD-10-CM | POA: Diagnosis not present

## 2021-05-07 DIAGNOSIS — M9903 Segmental and somatic dysfunction of lumbar region: Secondary | ICD-10-CM | POA: Diagnosis not present

## 2021-05-07 DIAGNOSIS — M9905 Segmental and somatic dysfunction of pelvic region: Secondary | ICD-10-CM | POA: Diagnosis not present

## 2021-05-15 ENCOUNTER — Ambulatory Visit: Payer: Self-pay

## 2021-05-15 ENCOUNTER — Telehealth: Payer: 59

## 2021-05-15 DIAGNOSIS — E782 Mixed hyperlipidemia: Secondary | ICD-10-CM

## 2021-05-15 DIAGNOSIS — N183 Chronic kidney disease, stage 3 unspecified: Secondary | ICD-10-CM

## 2021-05-15 DIAGNOSIS — I10 Essential (primary) hypertension: Secondary | ICD-10-CM

## 2021-05-15 NOTE — Chronic Care Management (AMB) (Signed)
? Care Management ?  ? RN Visit Note ? ?05/15/2021 ?Name: Joshua Lee MRN: [id number] DOB: March 07, 1965 ? ?Subjective: ?Joshua Lee is a 56 y.o. year old male who is a primary care patient of [name], DO. The care management team was consulted for assistance with disease management and care coordination needs.   ? ?Engaged with patient by telephone for follow up visit in response to provider referral for case management and/or care coordination services.  ? ?Consent to Services:  ? [name] was given information about Care Management services today including:  ?Care Management services includes personalized support from designated clinical staff supervised by his physician, including individualized plan of care and coordination with other care providers ?24/7 contact phone numbers for assistance for urgent and routine care needs. ?The patient may stop case management services at any time by phone call to the office staff. ? ?Patient agreed to services and consent obtained.  ? ?Assessment: Review of patient past medical history, allergies, medications, health status, including review of consultants reports, laboratory and other test data, was performed as part of comprehensive evaluation and provision of chronic care management services.  ? ?SDOH (Social Determinants of Health) assessments and interventions performed:   ? ?Care Plan ? ?Allergies  ?Allergen Reactions  ? Penicillins Rash  ?  Has patient had a PCN reaction causing immediate rash, facial/tongue/throat swelling, SOB or lightheadedness with hypotension: unknown ?Has patient had a PCN reaction causing severe rash involving mucus membranes or skin necrosis: unknown ?Has patient had a PCN reaction that required hospitalization: unknown ?Has patient had a PCN reaction occurring within the last 10 years: no ?If all of the above answers are "NO", then may proceed with Cephalosporin use. ?  ? ? ?Outpatient Encounter Medications as of 05/15/2021   ?Medication Sig  ? acetaminophen (TYLENOL) 650 MG CR tablet Take 650-1,950 mg by mouth every 8 (eight) hours as needed for pain.  ? amLODipine (NORVASC) 5 MG tablet Take 1 tablet (5 mg total) by mouth daily.  ? aspirin 81 MG tablet Take 81 mg by mouth daily.  ? atorvastatin (LIPITOR) 80 MG tablet Take 1 tablet (80 mg total) by mouth at bedtime.  ? benazepril (LOTENSIN) 40 MG tablet Take 1 tablet (40 mg total) by mouth daily.  ? calcium carbonate (TUMS EX) 750 MG chewable tablet Chew 2 tablets by mouth daily as needed for heartburn.   ? cetirizine (ZYRTEC) 10 MG tablet Take 10 mg by mouth daily as needed for allergies.  (Patient not taking: Reported on 03/02/2021)  ? chlorhexidine (PERIDEX) 0.12 % solution SMARTSIG:0.5 Ounce(s) By Mouth Morning-Night  ? Coenzyme Q-10 100 MG capsule Take 100 mg by mouth daily.  ? CONTOUR NEXT TEST test strip USE AS DIRECTED  ? dapagliflozin propanediol (FARXIGA) 10 MG TABS tablet Take 1 tablet (10 mg total) by mouth daily before breakfast.  ? Dulaglutide (TRULICITY) 1.5 0000000 SOPN Inject 1.5 mg into the skin once a week.  ? Dulaglutide (TRULICITY) 3 0000000 SOPN Inject 3 mg as directed once a week.  ? fluticasone (FLONASE) 50 MCG/ACT nasal spray Place 1 spray into both nostrils 2 (two) times daily.  ? Garlic 123XX123 MG CAPS Take 1,000 mg by mouth daily.  ? metFORMIN (GLUCOPHAGE) 500 MG tablet TAKE 2 TABLETS(1000 MG) BY MOUTH TWICE DAILY WITH A MEAL  ? metoprolol tartrate (LOPRESSOR) 100 MG tablet Take 1 tablet (100 mg total) by mouth 2 (two) times daily.  ? Potassium Citrate 15 MEQ (1620 MG)  TBCR Take 15 mEq by mouth 2 (two) times daily. 2 tab QAM, 1 tab QPM  ? tamsulosin (FLOMAX) 0.4 MG CAPS capsule TAKE 1 CAPSULE(0.4 MG) BY MOUTH DAILY  ? tolnaftate (TINACTIN) 1 % spray Apply 1 application topically as needed.  ? VASCEPA 1 g capsule Take 2 capsules (2 g total) by mouth 2 (two) times daily.  ? ?No facility-administered encounter medications on file as of 05/15/2021.  ? ? ?Patient  Active Problem List  ? Diagnosis Date Noted  ? Type 2 diabetes mellitus with stage 3 chronic kidney disease, without long-term current use of insulin ([place]) 03/06/2019  ? Morbid obesity ([place]) 11/10/2017  ? CKD (chronic kidney disease), stage II 03/16/2016  ? Plantar fasciitis 04/17/2015  ? Osteoarthritis of both feet 03/05/2015  ? Nephrolithiasis 12/02/2014  ? Hypertension 07/02/2014  ? Hyperlipidemia 07/02/2014  ? Obesity, diabetes, and hypertension syndrome ([place]) 07/02/2014  ? Difficult airway for intubation 05/09/2014  ? ? ?Conditions to be addressed/monitored: HTN, HLD, and DMII ? ?Care Plan : RNCM: General Plan of Care (Adult) for Chronic Disease Management and Care Coordination needs  ?Updates made by Vanita Ingles, RN since 05/15/2021 12:00 AM  ?  ? ?Problem: RNCM: Development of plan of care for Chronic Disease Management and Care Coordination Needs (HTN, HLD, DM)   ?Priority: High  ?  ? ?Long-Range Goal: RNCM: Effective management  of plan of care for Chronic Disease Management and Care Coordination Needs (HTN, HLD, DM)   ?Start Date: 11/07/2020  ?Expected End Date: 11/07/2021  ?Priority: High  ?Note:   ?Current Barriers:  ?Knowledge Deficits related to plan of care for management of HTN, HLD, and DMII  ?Chronic Disease Management support and education needs related to HTN, HLD, and DMII ?Lacks caregiver support.  ? ?RNCM Clinical Goal(s):  ?Patient will verbalize understanding of plan for management of HTN, HLD, and DMII  ?verbalize basic understanding of HTN, HLD, and DMII disease process and self health management plan  ?take all medications exactly as prescribed and will call provider for medication related questions ?demonstrate understanding of rationale for each prescribed medication  ?attend all scheduled medical appointments: 09-02-2021 at 1020 am ?demonstrate improved and ongoing adherence to prescribed treatment plan for HTN, HLD, and DMII as evidenced by daily monitoring and recording of CBG   adherence to ADA/ carb modified diet adherence to prescribed medication regimen contacting provider for new or worsened symptoms or questions  ?demonstrate improved and ongoing health management independence  ?demonstrate a decrease in HTN, HLD, and DMII exacerbations  ?demonstrate ongoing self health care management ability effective management of chronic diseases through collaboration with RN Care manager, provider, and care team.  ? ?Interventions: ?1:1 collaboration with primary care provider regarding development and update of comprehensive plan of care as evidenced by provider attestation and co-signature ?Inter-disciplinary care team collaboration (see longitudinal plan of care) ?Evaluation of current treatment plan related to  self management and patient's adherence to plan as established by provider ? ? ?SDOH Barriers (Status: Goal on track: YES.)  ?Patient interviewed and SDOH assessment performed ?       ?Patient interviewed and appropriate assessments performed ?Provided patient with information about resources for [facility] and care guides to assist with any new SDOH needs ?Discussed plans with patient for ongoing care management follow up and provided patient with direct contact information for care management team ?Advised patient to call the office for changes in SDOH, questions, and concerns ? ? ? ?Diabetes:  (Status: Goal  on track: YES.) ?Lab Results  ?Component Value Date  ? HGBA1C 6.6 (H) 03/02/2021  ?Assessed patient's understanding of A1c goal: <7%. 05-05-2021: The patient is in range of A1C. Review with patient today at face to face visit the goal of A1C. 05-15-2021: The patient is at goal.  ?Provided education to patient about basic DM disease process. 05-05-2021: Education provided today with handouts on healthy eating choices and effective management of DM. The patient is good about asking questions and making sure he understands his options. 05-15-2021: The patient has good questions and  ask about good food options. The patient wants to know more about his DM and how to effectively manage. ; ?Reviewed medications with patient and discussed importance of medication adherence. 01-07-2021: The p

## 2021-05-15 NOTE — Patient Instructions (Signed)
Visit Information ? ?Thank you for taking time to visit with me today. Please don't hesitate to contact me if I can be of assistance to you before our next scheduled telephone appointment. ? ?Following are the goals we discussed today:  ?RNCM Clinical Goal(s):  ?Patient will verbalize understanding of plan for management of HTN, HLD, and DMII  ?verbalize basic understanding of HTN, HLD, and DMII disease process and self health management plan  ?take all medications exactly as prescribed and will call provider for medication related questions ?demonstrate understanding of rationale for each prescribed medication  ?attend all scheduled medical appointments: 09-02-2021 at 1020 am ?demonstrate improved and ongoing adherence to prescribed treatment plan for HTN, HLD, and DMII as evidenced by daily monitoring and recording of CBG  adherence to ADA/ carb modified diet adherence to prescribed medication regimen contacting provider for new or worsened symptoms or questions  ?demonstrate improved and ongoing health management independence  ?demonstrate a decrease in HTN, HLD, and DMII exacerbations  ?demonstrate ongoing self health care management ability effective management of chronic diseases through collaboration with RN Care manager, provider, and care team.  ?  ?Interventions: ?1:1 collaboration with primary care provider regarding development and update of comprehensive plan of care as evidenced by provider attestation and co-signature ?Inter-disciplinary care team collaboration (see longitudinal plan of care) ?Evaluation of current treatment plan related to  self management and patient's adherence to plan as established by provider ?  ?  ?SDOH Barriers (Status: Goal on track: YES.)  ?Patient interviewed and SDOH assessment performed ?       ?Patient interviewed and appropriate assessments performed ?Provided patient with information about resources for [facility] and care guides to assist with any new SDOH  needs ?Discussed plans with patient for ongoing care management follow up and provided patient with direct contact information for care management team ?Advised patient to call the office for changes in SDOH, questions, and concerns ?  ?  ?  ?Diabetes:  (Status: Goal on track: YES.) ?     ?Lab Results  ?Component Value Date  ?  HGBA1C 6.6 (H) 03/02/2021  ?Assessed patient's understanding of A1c goal: <7%. 05-05-2021: The patient is in range of A1C. Review with patient today at face to face visit the goal of A1C. 05-15-2021: The patient is at goal.  ?Provided education to patient about basic DM disease process. 05-05-2021: Education provided today with handouts on healthy eating choices and effective management of DM. The patient is good about asking questions and making sure he understands his options. 05-15-2021: The patient has good questions and ask about good food options. The patient wants to know more about his DM and how to effectively manage. ; ?Reviewed medications with patient and discussed importance of medication adherence. 01-07-2021: The patient is compliant with medications. 03-11-2021: The patient is having issues with affordability of medications. [facility] and talked to the pharmacist at [phone]. Was directed to call Dan[facility] at [phone]. Spoke with Kirsitie who advised about shortage of trulicity 3 mg but 1.5 mg being available. The patient can get a coupon and get the Trulicity 1.5 mg for $25.00 and a [facility] at [facility]  [facility]ristie [facility] and seeing if an [facility]ou[facility] can [facility] so the patient can get the needed medications.  Danford BadKristie is to call the RNCM back to collaborate on the best way to help the patient to obtain the  needed. Danford Bad returned the call and the patient even with the coupon would still have to pay 169.00 for jardiance. The pharm D at [facility] outpatient pharmacy states if the provider will  write for Farxiga instead of Jardiance then it would be 15.00 a month.  The fax number to the [facility] pharmacy is [phone]. The RNCM will collaborate with the pcp and patient concerning the information received from the help of the pharmacist at [facility] pharmacy.  The patient wants to call Kristi at the [facility] and ask questions before agreeing to switch medications over. Will alert Dr. Laural Benes of the current findings and the patient will also call RNCM back with his decision. The patient states he does have some jardiance and Ozempic on hand, but will run out and needs to have medications he can take. Will continue to monitor. 03-12-2021: Incoming call from the patient asking the RNCM to let Dr. Laural Benes know he would like the Trulicity sent to [facility] and if she desires to change the Jardiance to [place] to please send this to [facility] as well. The patient will work with Danford Bad at [facility] pharmacy to get these medications. Secure in-basket message sent to the the pcp with patients request. Will continue to monitor and follow accordingly.  03-20-2021: Incoming call from the patient asking for assistance for medications. The patient states he has changed his mind and wants [facility] and not [facility] outpatient pharmacy. Danford Bad has agreed to continue to help him with his request.  The script for the Trulicity is there, but if Dr. Laural Benes desires she will need to change the Jardiance to [place] and send a new script for the [place]. The patient cannot afford the Jardiance. Will send an in basket message and ask Dr. Henriette Combs recommendations. 04-03-2021: The patient has his medications. Still has some supply of the Jardiance and Ozempic and will use this up first. The patient will come to the office to get assistance with administration of Trulicity. The patient will call the office before coming to the office to get the [facility] to assist with administration. 05-05-2021: The patient came to the office today for face to  face visit with the [facility] for instructions and education on administering Trulicity effecively.  The patient viewed a 4 minute video on how to effectively administer the injection from the [employer]. Then the [facility] assisted the patient with his first injection and monitored for correct process. The patient was able to ask questions and the patient felt confident in administration after. Gave the patient information on safe disposable of Trulicity pen and gave drop off for sharps disposal in [place] at [facility] [phone]- [address], [place]. The patient received handouts for effective management of DM. Will continue to monitor.  05-15-2021: The patient has two more weeks of the 1.5 mg of Trulicity and then he will go to 3.0 mg. He states that he thinks he is doing it correctly. Denies any issue with medications at this time.  ?Reviewed prescribed diet with patient heart healthy/ADA diet. 05-05-2021: The patient is compliant with heart healthy/ADA diet.  Handout provided today to the patient on healthy eating and food options. 05-15-2021: The patient is being more mindful of his dietary restrictions. The patient is watching what he eats and eating more fruits and vegetables. ; ?Counseled on importance of regular laboratory monitoring as prescribed. 05-15-2021: The patient has regular lab test;        ?Discussed plans with patient for ongoing  care management follow up and provided patient with direct contact information for care management team;      ?Provided patient with written educational materials related to hypo and hyperglycemia and importance of correct treatment.  01-07-2021: The patient denies any issues with hypo and hyperglycemia. States that the lowest he has seen is 81.  Denies any issues with hypoglycemia at this time. 04-03-2021: The patient is not checking his blood sugars. Encouraged the patient to check his blood sugars.  Review today the benefits of checking blood sugars on a regular  basis. The patient states he will start back checking his blood sugars. 05-15-2021: The patient provided several readings over the last couple weeks. The patients range is 107 to 138. Praised the patient for [employer]

## 2021-06-04 DIAGNOSIS — M9905 Segmental and somatic dysfunction of pelvic region: Secondary | ICD-10-CM | POA: Diagnosis not present

## 2021-06-04 DIAGNOSIS — M5136 Other intervertebral disc degeneration, lumbar region: Secondary | ICD-10-CM | POA: Diagnosis not present

## 2021-06-04 DIAGNOSIS — M9903 Segmental and somatic dysfunction of lumbar region: Secondary | ICD-10-CM | POA: Diagnosis not present

## 2021-06-04 DIAGNOSIS — M5416 Radiculopathy, lumbar region: Secondary | ICD-10-CM | POA: Diagnosis not present

## 2021-06-16 ENCOUNTER — Telehealth: Payer: 59

## 2021-06-19 ENCOUNTER — Telehealth: Payer: Self-pay

## 2021-06-19 ENCOUNTER — Telehealth: Payer: 59

## 2021-06-19 NOTE — Telephone Encounter (Signed)
  Care Management   Follow Up Note   06/19/2021 Name: Joshua Lee MRN: [id number] DOB: 06-Oct-1965   Referred by: [name], DO Reason for referral : Chronic Care Management (RNCM: Follow up for Chronic Disease Management and Care Coordination Needs )   An unsuccessful telephone outreach was attempted today. The patient was referred to the case management team for assistance with care management and care coordination.   Follow Up Plan: A HIPPA compliant phone message was left for the patient providing contact information and requesting a return call.   Alto Denver RN, MSN, CCM Community Care Coordinator [place]  [facility] Mobile: [phone]

## 2021-07-02 DIAGNOSIS — M5136 Other intervertebral disc degeneration, lumbar region: Secondary | ICD-10-CM | POA: Diagnosis not present

## 2021-07-02 DIAGNOSIS — M9905 Segmental and somatic dysfunction of pelvic region: Secondary | ICD-10-CM | POA: Diagnosis not present

## 2021-07-02 DIAGNOSIS — M5416 Radiculopathy, lumbar region: Secondary | ICD-10-CM | POA: Diagnosis not present

## 2021-07-02 DIAGNOSIS — M9903 Segmental and somatic dysfunction of lumbar region: Secondary | ICD-10-CM | POA: Diagnosis not present

## 2021-07-09 LAB — HM DIABETES EYE EXAM

## 2021-07-22 ENCOUNTER — Telehealth: Payer: 59

## 2021-07-22 ENCOUNTER — Ambulatory Visit: Payer: Self-pay

## 2021-07-22 DIAGNOSIS — N183 Chronic kidney disease, stage 3 unspecified: Secondary | ICD-10-CM

## 2021-07-22 DIAGNOSIS — I10 Essential (primary) hypertension: Secondary | ICD-10-CM

## 2021-07-22 DIAGNOSIS — E782 Mixed hyperlipidemia: Secondary | ICD-10-CM

## 2021-07-22 NOTE — Chronic Care Management (AMB) (Signed)
Care Management    RN Visit Note  07/22/2021 Name: Joshua Lee MRN: [id number] DOB: Oct 20, 1965  Subjective: Joshua Lee is a 56 y.o. year old male who is a primary care patient of [name], DO. The care management team was consulted for assistance with disease management and care coordination needs.    Engaged with patient by telephone for follow up visit in response to provider referral for case management and/or care coordination services.   Consent to Services:   Joshua Lee was given information about Care Management services today including:  Care Management services includes personalized support from designated clinical staff supervised by his physician, including individualized plan of care and coordination with other care providers 24/7 contact phone numbers for assistance for urgent and routine care needs. The patient may stop case management services at any time by phone call to the office staff.  Patient agreed to services and consent obtained.   Assessment: Review of patient past medical history, allergies, medications, health status, including review of consultants reports, laboratory and other test data, was performed as part of comprehensive evaluation and provision of chronic care management services.   SDOH (Social Determinants of Health) assessments and interventions performed:    Care Plan  Allergies  Allergen Reactions   Penicillins Rash    Has patient had a PCN reaction causing immediate rash, facial/tongue/throat swelling, SOB or lightheadedness with hypotension: unknown Has patient had a PCN reaction causing severe rash involving mucus membranes or skin necrosis: unknown Has patient had a PCN reaction that required hospitalization: unknown Has patient had a PCN reaction occurring within the last 10 years: no If all of the above answers are "NO", then may proceed with Cephalosporin use.     Outpatient Encounter Medications as of 07/22/2021   Medication Sig   acetaminophen (TYLENOL) 650 MG CR tablet Take 650-1,950 mg by mouth every 8 (eight) hours as needed for pain.   amLODipine (NORVASC) 5 MG tablet Take 1 tablet (5 mg total) by mouth daily.   aspirin 81 MG tablet Take 81 mg by mouth daily.   atorvastatin (LIPITOR) 80 MG tablet Take 1 tablet (80 mg total) by mouth at bedtime.   benazepril (LOTENSIN) 40 MG tablet Take 1 tablet (40 mg total) by mouth daily.   calcium carbonate (TUMS EX) 750 MG chewable tablet Chew 2 tablets by mouth daily as needed for heartburn.    cetirizine (ZYRTEC) 10 MG tablet Take 10 mg by mouth daily as needed for allergies.  (Patient not taking: Reported on 03/02/2021)   chlorhexidine (PERIDEX) 0.12 % solution SMARTSIG:0.5 Ounce(s) By Mouth Morning-Night   Coenzyme Q-10 100 MG capsule Take 100 mg by mouth daily.   CONTOUR NEXT TEST test strip USE AS DIRECTED   dapagliflozin propanediol (FARXIGA) 10 MG TABS tablet Take 1 tablet (10 mg total) by mouth daily before breakfast.   Dulaglutide (TRULICITY) 1.5 TK/3.5WS SOPN Inject 1.5 mg into the skin once a week.   Dulaglutide (TRULICITY) 3 FK/8.1EX SOPN Inject 3 mg as directed once a week.   fluticasone (FLONASE) 50 MCG/ACT nasal spray Place 1 spray into both nostrils 2 (two) times daily.   Garlic 5170 MG CAPS Take 1,000 mg by mouth daily.   metFORMIN (GLUCOPHAGE) 500 MG tablet TAKE 2 TABLETS(1000 MG) BY MOUTH TWICE DAILY WITH A MEAL   metoprolol tartrate (LOPRESSOR) 100 MG tablet Take 1 tablet (100 mg total) by mouth 2 (two) times daily.   Potassium Citrate 15 MEQ (1620 MG)  TBCR Take 15 mEq by mouth 2 (two) times daily. 2 tab QAM, 1 tab QPM   tamsulosin (FLOMAX) 0.4 MG CAPS capsule TAKE 1 CAPSULE(0.4 MG) BY MOUTH DAILY   tolnaftate (TINACTIN) 1 % spray Apply 1 application topically as needed.   VASCEPA 1 g capsule Take 2 capsules (2 g total) by mouth 2 (two) times daily.   No facility-administered encounter medications on file as of 07/22/2021.    Patient  Active Problem List   Diagnosis Date Noted   Type 2 diabetes mellitus with stage 3 chronic kidney disease, without long-term current use of insulin ([place]) 03/06/2019   Morbid obesity ([place]) 11/10/2017   CKD (chronic kidney disease), stage II 03/16/2016   Plantar fasciitis 04/17/2015   Osteoarthritis of both feet 03/05/2015   Nephrolithiasis 12/02/2014   Hypertension 07/02/2014   Hyperlipidemia 07/02/2014   Obesity, diabetes, and hypertension syndrome ([place]) 07/02/2014   Difficult airway for intubation 05/09/2014    Conditions to be addressed/monitored: HTN, HLD, and DMII  Care Plan : RNCM: General Plan of Care (Adult) for Chronic Disease Management and Care Coordination needs  Updates made by Vanita Ingles, RN since 07/22/2021 12:00 AM  Completed 07/22/2021   Problem: RNCM: Development of plan of care for Chronic Disease Management and Care Coordination Needs (HTN, HLD, DM) Resolved 07/22/2021  Priority: High     Long-Range Goal: RNCM: Effective management  of plan of care for Chronic Disease Management and Care Coordination Needs (HTN, HLD, DM) Completed 07/22/2021  Start Date: 11/07/2020  Expected End Date: 11/07/2021  Priority: High  Note:   Current Barriers: 07-22-2021: Goals met and care plan is being closed. The patient has met the goals of care. The patient know the care plan is being closed and doing well. The patient knows to call the [facility] for new concerns, changes, or questions Knowledge Deficits related to plan of care for management of HTN, HLD, and DMII  Chronic Disease Management support and education needs related to HTN, HLD, and DMII Lacks caregiver support.   RNCM Clinical Goal(s):  Patient will verbalize understanding of plan for management of HTN, HLD, and DMII  verbalize basic understanding of HTN, HLD, and DMII disease process and self health management plan  take all medications exactly as prescribed and will call provider for medication related questions demonstrate  understanding of rationale for each prescribed medication  attend all scheduled medical appointments: 09-02-2021 at 1020 am demonstrate improved and ongoing adherence to prescribed treatment plan for HTN, HLD, and DMII as evidenced by daily monitoring and recording of CBG  adherence to ADA/ carb modified diet adherence to prescribed medication regimen contacting provider for new or worsened symptoms or questions  demonstrate improved and ongoing health management independence  demonstrate a decrease in HTN, HLD, and DMII exacerbations  demonstrate ongoing self health care management ability effective management of chronic diseases through collaboration with RN Care manager, provider, and care team.   Interventions: 1:1 collaboration with primary care provider regarding development and update of comprehensive plan of care as evidenced by provider attestation and co-signature Inter-disciplinary care team collaboration (see longitudinal plan of care) Evaluation of current treatment plan related to  self management and patient's adherence to plan as established by provider   SDOH Barriers (Status: Goal Met.) 07-22-2021: Goals met and care plan is being closed  Patient interviewed and SDOH assessment performed        Patient interviewed and appropriate assessments performed Provided patient with information about resources for [facility]  and care guides to assist with any new SDOH needs Discussed plans with patient for ongoing care management follow up and provided patient with direct contact information for care management team Advised patient to call the office for changes in SDOH, questions, and concerns    Diabetes:  (Status: Goal Met.) 07-22-2021: Goals met and care plan is being closed  Lab Results  Component Value Date   HGBA1C 6.6 (H) 03/02/2021  Assessed patient's understanding of A1c goal: <7%. 05-05-2021: The patient is in range of A1C. Review with patient today at face to face visit  the goal of A1C. 07-22-2021: The patient is at goal.  Provided education to patient about basic DM disease process. 05-05-2021: Education provided today with handouts on healthy eating choices and effective management of DM. The patient is good about asking questions and making sure he understands his options. 05-15-2021: The patient has good questions and ask about good food options. The patient wants to know more about his DM and how to effectively manage. ; Reviewed medications with patient and discussed importance of medication adherence. 01-07-2021: The patient is compliant with medications. 03-11-2021: The patient is having issues with affordability of medications. Call made to [facility] today and talked to the pharmacist at [phone]. Was directed to call Drue Dun at the [facility] outpatient pharmacy at [phone]. Spoke with [place] who advised about shortage of trulicity 3 mg but 1.5 mg being available. The patient can get a coupon and get the Trulicity 1.5 mg for $93.90 and a coupon for Jardiance at [place] is assisting the [facility] with calling [facility] and seeing if an e-voucher can be sent to [facility] so the patient can get the needed medications.  Drue Dun is to call the RNCM back to collaborate on the best way to help the patient to obtain the needed. Drue Dun returned the call and the patient even with the coupon would still have to pay 169.00 for jardiance. The pharm D at [facility] outpatient pharmacy states if the provider will write for Farxiga instead of Jardiance then it would be 15.00 a month.  The fax number to the [facility] pharmacy is [phone]. The RNCM will collaborate with the pcp and patient concerning the information received from the help of the pharmacist at [place].  The patient wants to call Kristi at the [facility] and ask questions before agreeing to switch medications over. Will alert Dr. Wynetta Emery of the current findings and the patient will also call RNCM back with his decision. The patient states he does  have some jardiance and Ozempic on hand, but will run out and needs to have medications he can take. Will continue to monitor. 03-12-2021: Incoming call from the patient asking the RNCM to let Dr. Wynetta Emery know he would like the Trulicity sent to [facility] and if she desires to change the Jardiance to Fargixa to please send this to [facility] as well. The patient will work with Drue Dun at [place] to get these medications. Secure in-basket message sent to the the pcp with patients request. Will continue to monitor and follow accordingly.  03-20-2021: Incoming call from the patient asking for assistance for medications. The patient states he has changed his mind and wants to get the [facility] and not the [facility] outpatient pharmacy. Drue Dun has agreed to continue to help him with his request.  The script for the Trulicity is there, but if Dr. Wynetta Emery desires she will need to change the Jardiance to [place] and send a new script for  the [place]. The patient cannot afford the Jardiance. Will send an in basket message and ask Dr. Durenda Age recommendations. 04-03-2021: The patient has his medications. Still has some supply of the Jardiance and Ozempic and will use this up first. The patient will come to the office to get assistance with administration of Trulicity. The patient will call the office before coming to the office to get the [facility] to assist with administration. 05-05-2021: The patient came to the office today for face to face visit with the [facility] for instructions and education on administering Trulicity effecively.  The patient viewed a 4 minute video on how to effectively administer the injection from the [employer]. Then the [facility] assisted the patient with his first injection and monitored for correct process. The patient was able to ask questions and the patient felt confident in administration after. Gave the patient information on safe disposable of Trulicity pen and gave drop off for sharps disposal in  [place] [phone]- [address], [place]. The patient received handouts for effective management of DM. Will continue to monitor.  05-15-2021: The patient has two more weeks of the 1.5 mg of Trulicity and then he will go to 3.0 mg. He states that he thinks he is doing it correctly. Denies any issue with medications at this time.  Reviewed prescribed diet with patient heart healthy/ADA diet. 05-05-2021: The patient is compliant with heart healthy/ADA diet.  Handout provided today to the patient on healthy eating and food options. 05-15-2021: The patient is being more mindful of his dietary restrictions. The patient is watching what he eats and eating more fruits and vegetables. ; Counseled on importance of regular laboratory monitoring as prescribed. 05-15-2021: The patient has regular lab test;        Discussed plans with patient for ongoing care management follow up and provided patient with direct contact information for care management team;      Provided patient with written educational materials related to hypo and hyperglycemia and importance of correct treatment.  01-07-2021: The patient denies any issues with hypo and hyperglycemia. States that the lowest he has seen is 81.  Denies any issues with hypoglycemia at this time. 04-03-2021: The patient is not checking his blood sugars. Encouraged the patient to check his blood sugars.  Review today the benefits of checking blood sugars on a regular basis. The patient states he will start back checking his blood sugars. 05-15-2021: The patient provided several readings over the last couple weeks. The patients range is 107 to 138. Praised the patient for taking blood sugars and recording. Denies any real lows or real highs.      Reviewed scheduled/upcoming provider appointments including: 09-02-2021         Advised patient, providing education and rationale, to check cbg daily and record. 01-07-2021: States that he has not been taking his blood  sugar reading consistently.  The patient states that his blood sugars have been 81 to 130 that was back the first of December. Education on checking blood sugars at least daily or several times a week.  04-03-2021: Does not have any readings to give the the [facility]. Reminder given today. 05-15-2021: The patient provided several readings for the [facility]. Praised the patient for taking blood sugars and recording. Range has been 107 to 138 over last 2 weeks with an average of: 109. Praised the patient for compliance.     call provider for findings outside established parameters;  Review of patient status, including review of consultants reports, relevant laboratory and other test results, and medications completed;       Screening for signs and symptoms of depression related to chronic disease state;        Assessed social determinant of health barriers;         Hyperlipidemia:  (Status: Goal Met.)07-22-2021: Goals met and care plan is being closed. The patient is working on dietary restrictions and monitoring dietary habits.  Lab Results  Component Value Date   CHOL 109 03/02/2021   HDL 29 (L) 03/02/2021   LDLCALC 53 03/02/2021   TRIG 157 (H) 03/02/2021   CHOLHDL 6.5 (H) 02/09/2017     Medication review performed; medication list updated in electronic medical record. 07-22-2021: The patient is taking Lipitor 80 mg daily as prescribed.  Provider established cholesterol goals reviewed. 07-22-2021: Education and support given. Review of heart healthy/ADA diet.  Counseled on importance of regular laboratory monitoring as prescribed; Provided HLD educational materials; Reviewed role and benefits of statin for ASCVD risk reduction; Discussed strategies to manage statin-induced myalgias; Reviewed importance of limiting foods high in cholesterol. 07-22-2021: Education on heart healthy/ADA diet Reviewed exercise goals and target of 150 minutes per week; Screening for signs and symptoms of depression related to  chronic disease state;  Assessed social determinant of health barriers;   Hypertension: (Status: Goal Met.)07-22-2021: Goals met and care plan is being closed  Last practice recorded BP readings:  BP Readings from Last 3 Encounters:  03/02/21 110/75  09/02/20 135/77  08/28/20 102/64   Lab Results  Component Value Date   CREATININE 0.88 03/02/2021   BUN 11 03/02/2021   NA 144 03/02/2021   K 4.6 03/02/2021   CL 104 03/02/2021   CO2 21 03/02/2021        Evaluation of current treatment plan related to hypertension self management and patient's adherence to plan as established by provider. 03-11-2021: The patient denies any concerns with HTN and heart health. The patient is concerned about his mother and expressed how she has a lot of things going on and he is worried about her. Reflective listening and support given. Review of resources and to be patient with his parent. 04-03-2021: The patient had follow up with cardiology on 04-02-2021 and kidney specialist recently in [facility]. States that he is doing well and no changes were made to the current plan of care. The patient states he is doing well and denies any issues with HTN or heart health. 07-22-2021: The patients HTN and heart health are stable;   Provided education to patient re: stroke prevention, s/s of heart attack and stroke; Reviewed prescribed diet heart healthy/ADA  Reviewed medications with patient and discussed importance of compliance. 07-22-2021: The patient is compliant with medications for HTN health;  Counseled on adverse effects of illicit drug and excessive alcohol use in patients with high blood pressure;  Discussed plans with patient for ongoing care management follow up and provided patient with direct contact information for care management team; Advised patient, providing education and rationale, to monitor blood pressure daily and record, calling PCP for findings outside established parameters;  Reviewed  scheduled/upcoming provider appointments including:  Provided education on prescribed diet heart healthy/ADA;  Discussed complications of poorly controlled blood pressure such as heart disease, stroke, circulatory complications, vision complications, kidney impairment, sexual dysfunction;   Patient Goals/Self-Care Activities: Patient will self administer medications as prescribed as evidenced by self report/primary caregiver report  Patient will attend all  scheduled provider appointments as evidenced by clinician review of documented attendance to scheduled appointments and patient/caregiver report Patient will call pharmacy for medication refills as evidenced by patient report and review of pharmacy fill history as appropriate Patient will attend church or other social activities as evidenced by patient report Patient will continue to perform ADL's independently as evidenced by patient/caregiver report Patient will continue to perform IADL's independently as evidenced by patient/caregiver report Patient will call provider office for new concerns or questions as evidenced by review of documented incoming telephone call notes and patient report Patient will work with BSW to address care coordination needs and will continue to work with the clinical team to address health care and disease management related needs as evidenced by documented adherence to scheduled care management/care coordination appointments - check blood pressure 3 times per week - check blood pressure daily - check blood pressure weekly - choose a place to take my blood pressure (home, clinic or office, retail store) - learn about high blood pressure - keep a blood pressure log - take blood pressure log to all doctor appointments - call doctor for signs and symptoms of high blood pressure - develop an action plan for high blood pressure - keep all doctor appointments - take medications for blood pressure exactly as  prescribed - begin an exercise program - report new symptoms to your doctor - eat more whole grains, fruits and vegetables, lean meats and healthy fats - call for medicine refill 2 or 3 days before it runs out - take all medications exactly as prescribed - call doctor with any symptoms you believe are related to your medicine - call doctor when you experience any new symptoms - go to all doctor appointments as scheduled - adhere to prescribed diet: Heart Healthy/ADA diet       Plan: No further follow up required: the patient has met the goals and the plan of care has been closed. The patient knows how to reach the [facility] for new questions or concerns.  Noreene Larsson RN, MSN, [facility] Mobile: [phone]

## 2021-07-22 NOTE — Patient Instructions (Signed)
Visit Information  Thank you for taking time to visit with me today. Please don't hesitate to contact me if I can be of assistance to you before our next scheduled telephone appointment.  Following are the goals we discussed today:  Diabetes:  (Status: Goal Met.) 07-22-2021: Goals met and care plan is being closed       Lab Results  Component Value Date    HGBA1C 6.6 (H) 03/02/2021  Assessed patient's understanding of A1c goal: <7%. 05-05-2021: The patient is in range of A1C. Review with patient today at face to face visit the goal of A1C. 07-22-2021: The patient is at goal.  Provided education to patient about basic DM disease process. 05-05-2021: Education provided today with handouts on healthy eating choices and effective management of DM. The patient is good about asking questions and making sure he understands his options. 05-15-2021: The patient has good questions and ask about good food options. The patient wants to know more about his DM and how to effectively manage. ; Reviewed medications with patient and discussed importance of medication adherence. 01-07-2021: The patient is compliant with medications. 03-11-2021: The patient is having issues with affordability of medications. Call made to [facility] today and talked to the pharmacist at [phone]. Was directed to call Drue Dun at the [facility] outpatient pharmacy at [phone]. Spoke with Kirsitie who advised about shortage of trulicity 3 mg but 1.5 mg being available. The patient can get a coupon and get the Trulicity 1.5 mg for $97.58 and a coupon for Jardiance at [place] is assisting the [facility] with calling [facility] and seeing if an e-voucher can be sent to [facility] so the patient can get the needed medications.  Drue Dun is to call the RNCM back to collaborate on the best way to help the patient to obtain the needed. Drue Dun returned the call and the patient even with the coupon would still have to pay 169.00 for jardiance. The pharm D at [facility] outpatient  pharmacy states if the provider will write for Farxiga instead of Jardiance then it would be 15.00 a month.  The fax number to the [facility] pharmacy is [phone]. The RNCM will collaborate with the pcp and patient concerning the information received from the help of the pharmacist at [place].  The patient wants to call Kristi at the [facility] and ask questions before agreeing to switch medications over. Will alert Dr. Wynetta Emery of the current findings and the patient will also call RNCM back with his decision. The patient states he does have some jardiance and Ozempic on hand, but will run out and needs to have medications he can take. Will continue to monitor. 03-12-2021: Incoming call from the patient asking the RNCM to let Dr. Wynetta Emery know he would like the Trulicity sent to [facility] and if she desires to change the Jardiance to Fargixa to please send this to [facility] as well. The patient will work with Drue Dun at [place] to get these medications. Secure in-basket message sent to the the pcp with patients request. Will continue to monitor and follow accordingly.  03-20-2021: Incoming call from the patient asking for assistance for medications. The patient states he has changed his mind and wants [facility] and not the [facility] outpatient pharmacy. Drue Dun has agreed to continue to help him with his request.  The script for the Trulicity is there, but if Dr. Wynetta Emery desires she will need to change the Jardiance to Blanca and send a new script for  the [place]. The patient cannot afford the Jardiance. Will send an in basket message and ask Dr. Durenda Age recommendations. 04-03-2021: The patient has his medications. Still has some supply of the Jardiance and Ozempic and will use this up first. The patient will come to the office to get assistance with administration of Trulicity. The patient will call the office before coming to the office to get the [facility] to assist with administration. 05-05-2021: The patient  came to the office today for face to face visit with the [facility] for instructions and education on administering Trulicity effecively.  The patient viewed a 4 minute video on how to effectively administer the injection from the [employer]. Then the [facility] assisted the patient with his first injection and monitored for correct process. The patient was able to ask questions and the patient felt confident in administration after. Gave the patient information on safe disposable of Trulicity pen and gave drop off for sharps disposal in [place] at [place] [phone]- [address], [place]. The patient received handouts for effective management of DM. Will continue to monitor.  05-15-2021: The patient has two more weeks of the 1.5 mg of Trulicity and then he will go to 3.0 mg. He states that he thinks he is doing it correctly. Denies any issue with medications at this time.  Reviewed prescribed diet with patient heart healthy/ADA diet. 05-05-2021: The patient is compliant with heart healthy/ADA diet.  Handout provided today to the patient on healthy eating and food options. 05-15-2021: The patient is being more mindful of his dietary restrictions. The patient is watching what he eats and eating more fruits and vegetables. ; Counseled on importance of regular laboratory monitoring as prescribed. 05-15-2021: The patient has regular lab test;        Discussed plans with patient for ongoing care management follow up and provided patient with direct contact information for care management team;      Provided patient with written educational materials related to hypo and hyperglycemia and importance of correct treatment.  01-07-2021: The patient denies any issues with hypo and hyperglycemia. States that the lowest he has seen is 81.  Denies any issues with hypoglycemia at this time. 04-03-2021: The patient is not checking his blood sugars. Encouraged the patient to check his blood sugars.  Review today the benefits of  checking blood sugars on a regular basis. The patient states he will start back checking his blood sugars. 05-15-2021: The patient provided several readings over the last couple weeks. The patients range is 107 to 138. Praised the patient for taking blood sugars and recording. Denies any real lows or real highs.      Reviewed scheduled/upcoming provider appointments including: 09-02-2021         Advised patient, providing education and rationale, to check cbg daily and record. 01-07-2021: States that he has not been taking his blood sugar reading consistently.  The patient states that his blood sugars have been 81 to 130 that was back the first of December. Education on checking blood sugars at least daily or several times a week.  04-03-2021: Does not have any readings to give the the [facility]. Reminder given today. 05-15-2021: The patient provided several readings for the [facility]. Praised the patient for taking blood sugars and recording. Range has been 107 to 138 over last 2 weeks with an average of: 109. Praised the patient for compliance.     call provider for findings outside established parameters;  Review of patient status, including review of consultants reports, relevant laboratory and other test results, and medications completed;       Screening for signs and symptoms of depression related to chronic disease state;        Assessed social determinant of health barriers;          Hyperlipidemia:  (Status: Goal Met.)07-22-2021: Goals met and care plan is being closed. The patient is working on dietary restrictions and monitoring dietary habits.       Lab Results  Component Value Date    CHOL 109 03/02/2021    HDL 29 (L) 03/02/2021    LDLCALC 53 03/02/2021    TRIG 157 (H) 03/02/2021    CHOLHDL 6.5 (H) 02/09/2017      Medication review performed; medication list updated in electronic medical record. 07-22-2021: The patient is taking Lipitor 80 mg daily as prescribed.  Provider established  cholesterol goals reviewed. 07-22-2021: Education and support given. Review of heart healthy/ADA diet.  Counseled on importance of regular laboratory monitoring as prescribed; Provided HLD educational materials; Reviewed role and benefits of statin for ASCVD risk reduction; Discussed strategies to manage statin-induced myalgias; Reviewed importance of limiting foods high in cholesterol. 07-22-2021: Education on heart healthy/ADA diet Reviewed exercise goals and target of 150 minutes per week; Screening for signs and symptoms of depression related to chronic disease state;  Assessed social determinant of health barriers;    Hypertension: (Status: Goal Met.)07-22-2021: Goals met and care plan is being closed  Last practice recorded BP readings:     BP Readings from Last 3 Encounters:  03/02/21 110/75  09/02/20 135/77  08/28/20 102/64         Lab Results  Component Value Date    CREATININE 0.88 03/02/2021    BUN 11 03/02/2021    NA 144 03/02/2021    K 4.6 03/02/2021    CL 104 03/02/2021    CO2 21 03/02/2021         Evaluation of current treatment plan related to hypertension self management and patient's adherence to plan as established by provider. 03-11-2021: The patient denies any concerns with HTN and heart health. The patient is concerned about his mother and expressed how she has a lot of things going on and he is worried about her. Reflective listening and support given. Review of resources and to be patient with his parent. 04-03-2021: The patient had follow up with cardiology on 04-02-2021 and kidney specialist recently in [facility] States that he is doing well and no changes were made to the current plan of care. The patient states he is doing well and denies any issues with HTN or heart health. 07-22-2021: The patients HTN and heart health are stable;   Provided education to patient re: stroke prevention, s/s of heart attack and stroke; Reviewed prescribed diet heart healthy/ADA   Reviewed medications with patient and discussed importance of compliance. 07-22-2021: The patient is compliant with medications for HTN health;  Counseled on adverse effects of illicit drug and excessive alcohol use in patients with high blood pressure;  Discussed plans with patient for ongoing care management follow up and provided patient with direct contact information for care management team; Advised patient, providing education and rationale, to monitor blood pressure daily and record, calling PCP for findings outside established parameters;  Reviewed scheduled/upcoming provider appointments including:  Provided education on prescribed diet heart healthy/ADA;  Discussed complications of poorly controlled blood pressure such as heart disease, stroke, circulatory complications, vision  complications, kidney impairment, sexual dysfunction;     No further outreach required at this time. The patient has met the goals of care and the care plan is being closed.   Please call the care guide team at [phone] if you need to schedule an appointment.   If you are experiencing a Mental Health or [place] or need someone to talk to, please call the Suicide and Crisis Lifeline: 988 call the [place] National Suicide Prevention Lifeline: 216-708-7601 or TTY: 260-453-0830 TTY 303 670 3435) to talk to a trained counselor call 1-800-273-TALK (toll free, 24 hour hotline)   The patient verbalized understanding of instructions, educational materials, and care plan provided today and DECLINED offer to receive copy of patient instructions, educational materials, and care plan.   No further follow up required: The patient has met the goals of care and the care plan has been closed.  Noreene Larsson RN, MSN, [facility] Mobile: [phone]

## 2021-07-31 DIAGNOSIS — M9905 Segmental and somatic dysfunction of pelvic region: Secondary | ICD-10-CM | POA: Diagnosis not present

## 2021-07-31 DIAGNOSIS — M5136 Other intervertebral disc degeneration, lumbar region: Secondary | ICD-10-CM | POA: Diagnosis not present

## 2021-07-31 DIAGNOSIS — M9903 Segmental and somatic dysfunction of lumbar region: Secondary | ICD-10-CM | POA: Diagnosis not present

## 2021-07-31 DIAGNOSIS — M5416 Radiculopathy, lumbar region: Secondary | ICD-10-CM | POA: Diagnosis not present

## 2021-08-06 DIAGNOSIS — E119 Type 2 diabetes mellitus without complications: Secondary | ICD-10-CM | POA: Diagnosis not present

## 2021-08-06 DIAGNOSIS — E669 Obesity, unspecified: Secondary | ICD-10-CM | POA: Diagnosis not present

## 2021-08-06 DIAGNOSIS — I2510 Atherosclerotic heart disease of native coronary artery without angina pectoris: Secondary | ICD-10-CM | POA: Diagnosis not present

## 2021-08-06 DIAGNOSIS — E782 Mixed hyperlipidemia: Secondary | ICD-10-CM | POA: Diagnosis not present

## 2021-08-06 DIAGNOSIS — I10 Essential (primary) hypertension: Secondary | ICD-10-CM | POA: Diagnosis not present

## 2021-08-07 ENCOUNTER — Other Ambulatory Visit: Payer: Self-pay

## 2021-08-12 ENCOUNTER — Other Ambulatory Visit: Payer: Self-pay

## 2021-08-19 ENCOUNTER — Other Ambulatory Visit: Payer: Self-pay | Admitting: Family Medicine

## 2021-08-20 NOTE — Telephone Encounter (Signed)
Requested Prescriptions  Pending Prescriptions Disp Refills  . amLODipine (NORVASC) 5 MG tablet [Pharmacy Med Name: AMLODIPINE BESYLATE 5 MG TAB] 30 tablet 5    Sig: TAKE 1 TABLET (5 MG TOTAL) BY MOUTH DAILY.     Cardiovascular: Calcium Channel Blockers 2 Passed - 08/19/2021  2:19 AM      Passed - Last BP in normal range    BP Readings from Last 1 Encounters:  03/02/21 110/75         Passed - Last Heart Rate in normal range    Pulse Readings from Last 1 Encounters:  03/02/21 86         Passed - Valid encounter within last 6 months    Recent Outpatient Visits          5 months ago Primary hypertension   [facility] [place], [name], DO   11 months ago Routine general medical examination at a health care facility   [facility] [place] P, DO   1 year ago Abdominal pain, unspecified abdominal location   [facility] V[place], A[place]i, MD   1 year ago Type 2 diabetes mellitus with stage 3 chronic kidney disease, without long-term current use of insulin, unspecified whether stage 3a or 3b CKD (HCC)   [facility] [place], [name], DO   1 year ago Type 2 diabetes mellitus with stage 3 chronic kidney disease, without long-term current use of insulin, unspecified whether stage 3a or 3b CKD (HCC)   [facility] [place], Oralia Rud, DO      Future Appointments            In 1 week Laural Benes, Oralia Rud, DO [facility]

## 2021-08-27 DIAGNOSIS — M5136 Other intervertebral disc degeneration, lumbar region: Secondary | ICD-10-CM | POA: Diagnosis not present

## 2021-08-27 DIAGNOSIS — M9903 Segmental and somatic dysfunction of lumbar region: Secondary | ICD-10-CM | POA: Diagnosis not present

## 2021-08-27 DIAGNOSIS — M9905 Segmental and somatic dysfunction of pelvic region: Secondary | ICD-10-CM | POA: Diagnosis not present

## 2021-08-27 DIAGNOSIS — M5416 Radiculopathy, lumbar region: Secondary | ICD-10-CM | POA: Diagnosis not present

## 2021-09-01 ENCOUNTER — Emergency Department: Payer: 59

## 2021-09-01 ENCOUNTER — Encounter: Payer: Self-pay | Admitting: Emergency Medicine

## 2021-09-01 ENCOUNTER — Emergency Department
Admission: EM | Admit: 2021-09-01 | Discharge: 2021-09-01 | Disposition: A | Discharge status: Home / Self Care | Payer: 59 | Attending: Emergency Medicine | Admitting: Emergency Medicine

## 2021-09-01 ENCOUNTER — Other Ambulatory Visit: Payer: Self-pay

## 2021-09-01 DIAGNOSIS — M79651 Pain in right thigh: Secondary | ICD-10-CM | POA: Insufficient documentation

## 2021-09-01 DIAGNOSIS — M7989 Other specified soft tissue disorders: Secondary | ICD-10-CM | POA: Diagnosis not present

## 2021-09-01 DIAGNOSIS — M25551 Pain in right hip: Secondary | ICD-10-CM

## 2021-09-01 DIAGNOSIS — E1165 Type 2 diabetes mellitus with hyperglycemia: Secondary | ICD-10-CM | POA: Diagnosis not present

## 2021-09-01 DIAGNOSIS — M9905 Segmental and somatic dysfunction of pelvic region: Secondary | ICD-10-CM | POA: Diagnosis not present

## 2021-09-01 DIAGNOSIS — M5136 Other intervertebral disc degeneration, lumbar region: Secondary | ICD-10-CM | POA: Diagnosis not present

## 2021-09-01 DIAGNOSIS — M545 Low back pain, unspecified: Secondary | ICD-10-CM | POA: Diagnosis not present

## 2021-09-01 DIAGNOSIS — M9903 Segmental and somatic dysfunction of lumbar region: Secondary | ICD-10-CM | POA: Diagnosis not present

## 2021-09-01 DIAGNOSIS — M1711 Unilateral primary osteoarthritis, right knee: Secondary | ICD-10-CM | POA: Diagnosis not present

## 2021-09-01 DIAGNOSIS — M5416 Radiculopathy, lumbar region: Secondary | ICD-10-CM | POA: Insufficient documentation

## 2021-09-01 LAB — CBG MONITORING, ED: Glucose-Capillary: 189 mg/dL — ABNORMAL HIGH (ref 70–99)

## 2021-09-01 MED ORDER — OXYCODONE-ACETAMINOPHEN 5-325 MG PO TABS: 1.0000 | ORAL_TABLET | ORAL | 0 refills | Status: DC | PRN

## 2021-09-01 MED ORDER — CYCLOBENZAPRINE HCL 10 MG PO TABS
10.0000 mg | ORAL_TABLET | Freq: Once | ORAL | Status: AC
  Administered 2021-09-01: 10 mg via ORAL
  Filled 2021-09-01: qty 1

## 2021-09-01 MED ORDER — KETOROLAC TROMETHAMINE 30 MG/ML IJ SOLN
30.0000 mg | Freq: Once | INTRAMUSCULAR | Status: AC
  Administered 2021-09-01: 30 mg via INTRAMUSCULAR
  Filled 2021-09-01: qty 1

## 2021-09-01 MED ORDER — LIDOCAINE 5 % EX PTCH
1.0000 | MEDICATED_PATCH | CUTANEOUS | Status: DC
Start: 2021-09-01 — End: 2021-09-02
  Administered 2021-09-01: 1 via TRANSDERMAL
  Filled 2021-09-01: qty 1

## 2021-09-01 MED ORDER — CYCLOBENZAPRINE HCL 10 MG PO TABS: 10.0000 mg | ORAL_TABLET | Freq: Three times a day (TID) | ORAL | 0 refills | Status: DC | PRN

## 2021-09-01 MED ORDER — METHYLPREDNISOLONE 4 MG PO TBPK: ORAL_TABLET | ORAL | 0 refills | Status: DC

## 2021-09-01 MED ORDER — OXYCODONE-ACETAMINOPHEN 5-325 MG PO TABS
1.0000 | ORAL_TABLET | Freq: Once | ORAL | Status: AC
  Administered 2021-09-01: 1 via ORAL
  Filled 2021-09-01: qty 1

## 2021-09-01 NOTE — Discharge Instructions (Signed)
Follow-up with your regular doctor.  Follow-up with orthopedics.  Return if worsening.  Take medications as prescribed.

## 2021-09-01 NOTE — ED Provider Triage Note (Signed)
  Emergency Medicine Provider Triage Evaluation Note  Joshua Lee , a 56 y.o.male,  was evaluated in triage.  Pt complains of right lower leg swelling x2 days.  Endorses pain in his right hip and right knee as well.  Denies any recent injuries or illnesses.   Review of Systems  Positive: Right lower extremity pain/swelling Negative: Denies fever, chest pain, vomiting  Physical Exam  There were no vitals filed for this visit. Gen:   Awake, no distress   Resp:  Normal effort  MSK:   Moves extremities without difficulty  Other:  Mild increased swelling in the right thigh.  No erythema.  No distal pitting edema.  Medical Decision Making  Given the patient's initial medical screening exam, the following diagnostic evaluation has been ordered. The patient will be placed in the appropriate treatment space, once one is available, to complete the evaluation and treatment. I have discussed the plan of care with the patient and I have advised the patient that an ED physician or mid-level practitioner will reevaluate their condition after the test results have been received, as the results may give them additional insight into the type of treatment they may need.    Diagnostics: Right lower extremity ultrasound, x-rays  Treatments: none immediately   Varney Daily, [place] 09/01/21 1526

## 2021-09-01 NOTE — ED Provider Notes (Signed)
[facility] Provider Note    Event Date/Time   First MD Initiated Contact with Patient 09/01/21 1616     (approximate)   History   Hip Pain   HPI  Joshua Lee is a 56 y.o. male complains of right-sided lower back, right hip, right knee and right thigh pain.  Patient states that he has diabetes.  He has been switched from Ozempic to Trulicity.  States that he was hurting so much that he could not stand up without help.  He had called his mother to help him.  States he felt like the knee gave out and he fell onto the right knee.  No numbness or tingling.  Has had 2 back adjustments in the last 2 weeks by chiropractor.      Physical Exam   Triage Vital Signs: ED Triage Vitals  Enc Vitals Group     BP 09/01/21 1557 (!) 164/76     Pulse Rate 09/01/21 1557 88     Resp 09/01/21 1557 18     Temp 09/01/21 1557 98.3 F (36.8 C)     Temp Source 09/01/21 1557 Oral     SpO2 09/01/21 1557 100 %     Weight 09/01/21 1624 242 lb 15.2 oz (110.2 kg)     Height 09/01/21 1624 5\' 8"  (1.727 m)     Head Circumference --      Peak Flow --      Pain Score 09/01/21 1557 10     Pain Loc --      Pain Edu? --      Excl. in GC? --     Most recent vital signs: Vitals:   09/01/21 1557  BP: (!) 164/76  Pulse: 88  Resp: 18  Temp: 98.3 F (36.8 C)  SpO2: 100%     General: Awake, no distress.   CV:  Good peripheral perfusion. regular rate and  rhythm Resp:  Normal effort.  Abd:  No distention.   Other:  Lumbar spine nontender, right knee is not swollen, he does have full range of motion, no swelling noted on the right thigh, hip is nontender, IT band is tender   ED Results / Procedures / Treatments   Labs (all labs ordered are listed, but only abnormal results are displayed) Labs Reviewed  CBG MONITORING, ED - Abnormal; Notable for the following components:      Result Value   Glucose-Capillary 189 (*)    All other components within normal limits      EKG     RADIOLOGY X-ray of the right hip, right knee, ultrasound right lower extremity    PROCEDURES:   Procedures   MEDICATIONS ORDERED IN ED: Medications  lidocaine (LIDODERM) 5 % 1 patch (1 patch Transdermal Patch Applied 09/01/21 1742)  ketorolac (TORADOL) 30 MG/ML injection 30 mg (30 mg Intramuscular Given 09/01/21 1744)  cyclobenzaprine (FLEXERIL) tablet 10 mg (10 mg Oral Given 09/01/21 1746)  oxyCODONE-acetaminophen (PERCOCET/ROXICET) 5-325 MG per tablet 1 tablet (1 tablet Oral Given 09/01/21 1847)     IMPRESSION / MDM / ASSESSMENT AND PLAN / ED COURSE  I reviewed the triage vital signs and the nursing notes.                              Differential diagnosis includes, but is not limited to, lumbar radiculopathy, prosthetic malfunction, contusion, sprain, strain  Patient's presentation is most consistent with acute complicated  illness / injury requiring diagnostic workup.   The patient's labs are reassuring his FSBS is 189, which is actually good considering the patient had to him biscuits and sweet tea for breakfast this morning  X-rays were independently reviewed and interpreted by me as being negative.  X-ray of the right hip, right knee were negative for any acute abnormality  Ultrasound of the right lower extremity independently reviewed and interpreted by me as being negative for DVT.  Confirmed by radiology  I did explain the findings to the patient.  I explained to him that we will give him pain medication make sure he is able to walk prior to discharge.  This time I do not have any reason to admit him.  Patient was given Toradol 30 mg IM, Flexeril 10 mg p.o., and Lidoderm patch applied to the right side of the lower back.   ----------------------------------------- 6:28 PM on 09/01/2021 ----------------------------------------- Patient stating he had absolutely no relief with our pain medications.  Due to this fact and the fact that he had difficulty  standing we will have to order a CT of his lumbar spine and his right hip.  He was given Percocet for pain.  CT of the lumbar spine and right hip individually reviewed and interpreted by me as being negative for any acute abnormality.  Patient does have some narrowing on his spine which could cause some radicular pain from the L5-S1.  I did explain these findings to the patient.  To explain that we do not have a reason to admit him as he was able to stand on his own.  He does not have cauda equina.  He was given a prescription for Medrol Dosepak, Flexeril, and Percocet.  He is to follow-up with his regular doctor or orthopedics.  Return emergency department worsening.  Apply ice to the areas that hurt.  He was discharged in stable condition in the care of his mother.  Was also given a walker   FINAL CLINICAL IMPRESSION(S) / ED DIAGNOSES   Final diagnoses:  Right hip pain  Lumbar radiculopathy     Rx / DC Orders   ED Discharge Orders          Ordered    methylPREDNISolone (MEDROL DOSEPAK) 4 MG TBPK tablet        09/01/21 1943    cyclobenzaprine (FLEXERIL) 10 MG tablet  3 times daily PRN        09/01/21 1943    oxyCODONE-acetaminophen (PERCOCET) 5-325 MG tablet  Every 4 hours PRN        09/01/21 1943             Note:  This document was prepared using Dragon voice recognition software and may include unintentional dictation errors.    Faythe Ghee, PA-C 09/01/21 1946    Concha Se, MD 09/02/21 [phone]

## 2021-09-01 NOTE — ED Triage Notes (Signed)
Pt reports right sided hip/back pain that radiates down his right leg to his knee. Pt reports taking Tramadol at home with no relief.

## 2021-09-01 NOTE — ED Notes (Signed)
Pt provided with walker. Pt did not sign form. Pt and mother gave verbal consent to DC

## 2021-09-01 NOTE — ED Notes (Signed)
Pt reports pain in right hip

## 2021-09-02 ENCOUNTER — Encounter: Payer: 59 | Admitting: Family Medicine

## 2021-09-02 ENCOUNTER — Telehealth: Payer: Self-pay | Admitting: *Deleted

## 2021-09-02 NOTE — Telephone Encounter (Signed)
Pt stated he wanted to let Remi Haggard know that he can't get through [facility]: Dola Argyle. Odis Luster, MD Address: [address] [place], [place], [place] [address]  Pt mentioned he would keep trying.   Please advise.

## 2021-09-02 NOTE — Telephone Encounter (Signed)
Transition Care Management Follow-up Telephone Call Date of discharge and from where: [facility] 8-15 How have you been since you were released from the hospital? Patient states he is feeling a little better but still in pain Any questions or concerns? No  Items Reviewed: Did the pt receive and understand the discharge instructions provided? Yes  Medications obtained and verified? Yes  Other? No  Any new allergies since your discharge? No  Dietary orders reviewed? No Do you have support at home? No   Home Care and Equipment/Supplies: Were home health services ordered?  If so, what is the name of the agency?  Has the agency set up a time to come to the patient's home?  Were any new equipment or medical supplies ordered?   What is the name of the medical supply agency?  Were you able to get the supplies/equipment?  Do you have any questions related to the use of the equipment or supplies?   Functional Questionnaire: (I = Independent and D = Dependent) ADLs: I  Bathing/Dressing- I  Meal Prep- I  Eating- I  Maintaining continence- I  Transferring/Ambulation- I  Managing Meds- I  Follow up appointments reviewed:  PCP Hospital f/u appt confirmed? No   Specialist Hospital f/u appt confirmed?   The number was given for ortho doctor Are transportation arrangements needed? No  If their condition worsens, is the pt aware to call PCP or go to the Emergency Dept.? Yes Was the patient provided with contact information for the PCP's office or ED? Yes Was to pt encouraged to call back with questions or concerns? Yes

## 2021-09-07 DIAGNOSIS — M545 Low back pain, unspecified: Secondary | ICD-10-CM | POA: Diagnosis not present

## 2021-09-07 DIAGNOSIS — M5416 Radiculopathy, lumbar region: Secondary | ICD-10-CM | POA: Diagnosis not present

## 2021-09-08 DIAGNOSIS — M5416 Radiculopathy, lumbar region: Secondary | ICD-10-CM | POA: Diagnosis not present

## 2021-09-08 DIAGNOSIS — M545 Low back pain, unspecified: Secondary | ICD-10-CM | POA: Diagnosis not present

## 2021-09-16 DIAGNOSIS — M545 Low back pain, unspecified: Secondary | ICD-10-CM | POA: Diagnosis not present

## 2021-09-16 DIAGNOSIS — M5416 Radiculopathy, lumbar region: Secondary | ICD-10-CM | POA: Diagnosis not present

## 2021-09-18 DIAGNOSIS — M545 Low back pain, unspecified: Secondary | ICD-10-CM | POA: Diagnosis not present

## 2021-09-18 DIAGNOSIS — M5416 Radiculopathy, lumbar region: Secondary | ICD-10-CM | POA: Diagnosis not present

## 2021-09-23 ENCOUNTER — Encounter: Payer: Self-pay | Admitting: Family Medicine

## 2021-09-23 ENCOUNTER — Ambulatory Visit (INDEPENDENT_AMBULATORY_CARE_PROVIDER_SITE_OTHER): Payer: 59 | Admitting: Family Medicine

## 2021-09-23 ENCOUNTER — Other Ambulatory Visit: Payer: Self-pay | Admitting: Family Medicine

## 2021-09-23 VITALS — BP 113/76 | HR 85 | Temp 97.9°F | Ht 67.99 in | Wt 244.5 lb

## 2021-09-23 DIAGNOSIS — R3911 Hesitancy of micturition: Secondary | ICD-10-CM

## 2021-09-23 DIAGNOSIS — Z0000 Encounter for general adult medical examination without abnormal findings: Secondary | ICD-10-CM | POA: Diagnosis not present

## 2021-09-23 DIAGNOSIS — Z23 Encounter for immunization: Secondary | ICD-10-CM | POA: Diagnosis not present

## 2021-09-23 DIAGNOSIS — E669 Obesity, unspecified: Secondary | ICD-10-CM | POA: Diagnosis not present

## 2021-09-23 DIAGNOSIS — I10 Essential (primary) hypertension: Secondary | ICD-10-CM

## 2021-09-23 DIAGNOSIS — N182 Chronic kidney disease, stage 2 (mild): Secondary | ICD-10-CM | POA: Diagnosis not present

## 2021-09-23 DIAGNOSIS — E1122 Type 2 diabetes mellitus with diabetic chronic kidney disease: Secondary | ICD-10-CM | POA: Diagnosis not present

## 2021-09-23 DIAGNOSIS — E1159 Type 2 diabetes mellitus with other circulatory complications: Secondary | ICD-10-CM | POA: Diagnosis not present

## 2021-09-23 DIAGNOSIS — E1169 Type 2 diabetes mellitus with other specified complication: Secondary | ICD-10-CM | POA: Diagnosis not present

## 2021-09-23 DIAGNOSIS — E782 Mixed hyperlipidemia: Secondary | ICD-10-CM

## 2021-09-23 DIAGNOSIS — N183 Chronic kidney disease, stage 3 unspecified: Secondary | ICD-10-CM

## 2021-09-23 DIAGNOSIS — I152 Hypertension secondary to endocrine disorders: Secondary | ICD-10-CM | POA: Diagnosis not present

## 2021-09-23 LAB — MICROALBUMIN, URINE WAIVED
Creatinine, Urine Waived: 100 mg/dL (ref 10–300)
Microalb, Ur Waived: 10 mg/L (ref 0–19)
Microalb/Creat Ratio: <30 mg/g (ref <30)

## 2021-09-23 LAB — URINALYSIS, ROUTINE W REFLEX MICROSCOPIC
Bilirubin, UA: NEGATIVE
Ketones, UA: NEGATIVE
Leukocytes,UA: NEGATIVE
Nitrite, UA: NEGATIVE
Protein,UA: NEGATIVE
RBC, UA: NEGATIVE
Specific Gravity, UA: 1.02 (ref 1.005–1.030)
Urobilinogen, Ur: 0.2 mg/dL (ref 0.2–1.0)
pH, UA: 5.5 (ref 5.0–7.5)

## 2021-09-23 LAB — BAYER DCA HB A1C WAIVED: HB A1C (BAYER DCA - WAIVED): 7.1 % — ABNORMAL HIGH (ref 4.8–5.6)

## 2021-09-23 MED ORDER — CYCLOBENZAPRINE HCL 10 MG PO TABS: 10.0000 mg | ORAL_TABLET | Freq: Every day | ORAL | 1 refills | Status: AC

## 2021-09-23 MED ORDER — AMLODIPINE BESYLATE 5 MG PO TABS
5.0000 mg | ORAL_TABLET | Freq: Every day | ORAL | 2 refills | Status: DC
Start: 2021-09-23 — End: 2021-12-23

## 2021-09-23 MED ORDER — TRULICITY 3 MG/0.5ML ~~LOC~~ SOAJ: 3.0000 mg | SUBCUTANEOUS | 1 refills | Status: DC

## 2021-09-23 MED ORDER — TAMSULOSIN HCL 0.4 MG PO CAPS: ORAL_CAPSULE | ORAL | 1 refills | Status: DC

## 2021-09-23 MED ORDER — VASCEPA 1 G PO CAPS
2.0000 g | ORAL_CAPSULE | Freq: Two times a day (BID) | ORAL | 1 refills | Status: DC
Start: 2021-09-23 — End: 2021-09-24

## 2021-09-23 MED ORDER — ATORVASTATIN CALCIUM 80 MG PO TABS: 80.0000 mg | ORAL_TABLET | Freq: Every day | ORAL | 1 refills | Status: DC

## 2021-09-23 MED ORDER — METFORMIN HCL 500 MG PO TABS: ORAL_TABLET | ORAL | 1 refills | Status: DC

## 2021-09-23 MED ORDER — METOPROLOL TARTRATE 100 MG PO TABS: 100.0000 mg | ORAL_TABLET | Freq: Two times a day (BID) | ORAL | 1 refills | Status: DC

## 2021-09-23 MED ORDER — BENAZEPRIL HCL 40 MG PO TABS: 40.0000 mg | ORAL_TABLET | Freq: Every day | ORAL | 1 refills | Status: DC

## 2021-09-23 NOTE — Progress Notes (Signed)
BP 113/76   Pulse 85   Temp 97.9 F (36.6 C) (Oral)   Ht 5' 7.99" (1.727 m)   Wt 244 lb 8 oz (110.9 kg)   SpO2 98%   BMI 37.18 kg/m    Subjective:    Patient ID: Joshua Lee, male    DOB: 11/06/1965, 56 y.o.   MRN: [id number]  HPI: Joshua Lee is a 56 y.o. male presenting on 09/23/2021 for comprehensive medical examination. Current medical complaints include:  DIABETES Hypoglycemic episodes:no Polydipsia/polyuria: no Visual disturbance: no Chest pain: no Paresthesias: no Glucose Monitoring: no  Accucheck frequency: Not Checking Taking Insulin?: no Blood Pressure Monitoring: not checking Retinal Examination: Up to Date Foot Exam: Up to Date Diabetic Education: Completed Pneumovax: Up to Date Influenza: Up to Date Aspirin: yes  HYPERTENSION / HYPERLIPIDEMIA Satisfied with current treatment? no Duration of hypertension: chronic BP monitoring frequency: not checking BP medication side effects: no Past BP meds: benazepril, metoprolol, amlodipine Duration of hyperlipidemia: chronic Cholesterol medication side effects: no Cholesterol supplements: none Past cholesterol medications: atorvastatin Medication compliance: excellent compliance Aspirin: yes Recent stressors: no Recurrent headaches: no Visual changes: no Palpitations: no Dyspnea: no Chest pain: no Lower extremity edema: no Dizzy/lightheaded: no  Back pain- has been doing PT and seeing chiropractry. Seeing Ortho.  He currently lives with: alone Interim Problems from his last visit: no  Depression Screen done today and results listed below:     09/24/2021    4:35 PM 08/28/2020   10:19 AM 08/27/2020    3:54 PM 06/10/2020    1:12 PM 05/15/2019   10:18 AM  Depression screen PHQ 2/9  Decreased Interest 0 0 0 0 0  Down, Depressed, Hopeless 0 0 0 0 0  PHQ - 2 Score 0 0 0 0 0    Past Medical History:  Past Medical History:  Diagnosis Date   Diabetes mellitus without complication (HCC)    GERD  (gastroesophageal reflux disease)    Hyperlipidemia    Hypertension    Kidney stones    Plantar fascial fibromatosis     Surgical History:  Past Surgical History:  Procedure Laterality Date   CHOLECYSTECTOMY  2006   GALLBLADDER SURGERY     KIDNEY STONE SURGERY     KNEE ARTHROSCOPY     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   aspirin 81 MG tablet Take 81 mg by mouth daily.   calcium carbonate (TUMS EX) 750 MG chewable tablet Chew 2 tablets by mouth daily as needed for heartburn.    chlorhexidine (PERIDEX) 0.12 % solution SMARTSIG:0.5 Ounce(s) By Mouth Morning-Night   Coenzyme Q-10 100 MG capsule Take 100 mg by mouth daily.   CONTOUR NEXT TEST test strip USE AS DIRECTED   dapagliflozin propanediol (FARXIGA) 10 MG TABS tablet Take 1 tablet (10 mg total) by mouth daily before breakfast.   fluticasone (FLONASE) 50 MCG/ACT nasal spray Place 1 spray into both nostrils 2 (two) times daily.   Garlic 6314 MG CAPS Take 1,000 mg by mouth daily.   Potassium Citrate 15 MEQ (1620 MG) TBCR Take 15 mEq by mouth 2 (two) times daily. 2 tab QAM,  1 tab QPM   tolnaftate (TINACTIN) 1 % spray Apply 1 application topically as needed.   cetirizine (ZYRTEC) 10 MG tablet Take 10 mg by mouth daily as needed for allergies.  (Patient not taking: Reported on 09/23/2021)   No current facility-administered medications on file prior to visit.    Allergies:  Allergies  Allergen Reactions   Penicillins Rash    Has patient had a PCN reaction causing immediate rash, facial/tongue/throat swelling, SOB or lightheadedness with hypotension: unknown Has patient had a PCN reaction  causing severe rash involving mucus membranes or skin necrosis: unknown Has patient had a PCN reaction that required hospitalization: unknown Has patient had a PCN reaction occurring within the last 10 years: no If all of the above answers are "NO", then may proceed with Cephalosporin use.     Social History:  Social History   Socioeconomic History   Marital status: Single    Spouse name: Not on file   Number of children: Not on file   Years of education: Not on file   Highest education level: Not on file  Occupational History   Not on file  Tobacco Use   Smoking status: Never   Smokeless tobacco: Current    Types: Snuff  Vaping Use   Vaping Use: Never used  Substance and Sexual Activity   Alcohol use: Not Currently    Comment: Occasionally drinks beer   Drug use: No   Sexual activity: Not Currently  Other Topics Concern   Not on file  Social History Narrative   Not on file   Social Determinants of Health   Financial Resource Strain: Low Risk  (09/24/2021)   Overall Financial Resource Strain (CARDIA)    Difficulty of Paying Living Expenses: Not very hard  Food Insecurity: No Food Insecurity (09/24/2021)   Hunger Vital Sign    Worried About Running Out of Food in the Last Year: Never true    Ran Out of Food in the Last Year: Never true  Transportation Needs: No Transportation Needs (09/24/2021)   PRAPARE - Hydrologist (Medical): No    Lack of Transportation (Non-Medical): No  Physical Activity: Inactive (08/27/2020)   Exercise Vital Sign    Days of Exercise per Week: 0 days    Minutes of Exercise per Session: 0 min  Stress: No Stress Concern Present (08/27/2020)   [place]    Feeling of Stress : Not at all  Social Connections: Moderately Isolated (08/27/2020)   Social Connection and Isolation Panel [NHANES]    Frequency of Communication with Friends and Family: More than  three times a week    Frequency of Social Gatherings with Friends and Family: More than three times a week    Attends Religious Services: More than 4 times per year    Active Member of [employer] or Organizations: No    Attends Archivist Meetings: Never    Marital Status: Never married  Intimate Partner Violence: Not At Risk (09/24/2021)   Humiliation, Afraid, Rape, and Kick questionnaire    Fear of Current or Ex-Partner: No    Emotionally Abused: No    Physically Abused: No    Sexually Abused: No   Social History   Tobacco Use  Smoking Status Never  Smokeless Tobacco Current   Types: Snuff   Social History   Substance and Sexual Activity  Alcohol Use Not Currently   Comment: Occasionally drinks beer  Family History:  Family History  Problem Relation Age of Onset   Diabetes Maternal Uncle     Past medical history, surgical history, medications, allergies, family history and social history reviewed with patient today and changes made to appropriate areas of the chart.   Review of Systems  Constitutional: Negative.   HENT: Negative.    Eyes: Negative.   Respiratory: Negative.    Cardiovascular: Negative.   Gastrointestinal:  Positive for heartburn (with food choices). Negative for abdominal pain, blood in stool, constipation, diarrhea, melena, nausea and vomiting.  Genitourinary: Negative.   Musculoskeletal:  Positive for back pain and myalgias. Negative for falls, joint pain and neck pain.  Skin: Negative.   Neurological:  Positive for tingling and weakness. Negative for dizziness, tremors, sensory change, speech change, focal weakness, seizures, loss of consciousness and headaches.  Endo/Heme/Allergies:  Positive for polydipsia. Negative for environmental allergies. Does not bruise/bleed easily.  Psychiatric/Behavioral: Negative.     All other ROS negative except what is listed above and in the HPI.      Objective:    BP 113/76   Pulse 85   Temp 97.9 F  (36.6 C) (Oral)   Ht 5' 7.99" (1.727 m)   Wt 244 lb 8 oz (110.9 kg)   SpO2 98%   BMI 37.18 kg/m   Wt Readings from Last 3 Encounters:  09/23/21 244 lb 8 oz (110.9 kg)  09/01/21 242 lb 15.2 oz (110.2 kg)  03/02/21 243 lb (110.2 kg)    Physical Exam Vitals and nursing note reviewed.  Constitutional:      General: He is not in acute distress.    Appearance: Normal appearance. He is obese. He is not ill-appearing, toxic-appearing or diaphoretic.  HENT:     Head: Normocephalic and atraumatic.     Right Ear: Tympanic membrane, ear canal and external ear normal. There is no impacted cerumen.     Left Ear: Tympanic membrane, ear canal and external ear normal. There is no impacted cerumen.     Nose: Nose normal. No congestion or rhinorrhea.     Mouth/Throat:     Mouth: Mucous membranes are moist.     Pharynx: Oropharynx is clear. No oropharyngeal exudate or posterior oropharyngeal erythema.  Eyes:     General: No scleral icterus.       Right eye: No discharge.        Left eye: No discharge.     Extraocular Movements: Extraocular movements intact.     Conjunctiva/sclera: Conjunctivae normal.     Pupils: Pupils are equal, round, and reactive to light.  Neck:     Vascular: No carotid bruit.  Cardiovascular:     Rate and Rhythm: Normal rate and regular rhythm.     Pulses: Normal pulses.     Heart sounds: No murmur heard.    No friction rub. No gallop.  Pulmonary:     Effort: Pulmonary effort is normal. No respiratory distress.     Breath sounds: Normal breath sounds. No stridor. No wheezing, rhonchi or rales.  Chest:     Chest wall: No tenderness.  Abdominal:     General: Abdomen is flat. Bowel sounds are normal. There is no distension.     Palpations: Abdomen is soft. There is no mass.     Tenderness: There is no abdominal tenderness. There is no right CVA tenderness, left CVA tenderness, guarding or rebound.     Hernia: No hernia is present.  Genitourinary:    Comments: Genital  exam deferred with shared decision making Musculoskeletal:        General: No swelling, tenderness, deformity or signs of injury.     Cervical back: Normal range of motion and neck supple. No rigidity. No muscular tenderness.     Right lower leg: No edema.     Left lower leg: No edema.  Lymphadenopathy:     Cervical: No cervical adenopathy.  Skin:    General: Skin is warm and dry.     Capillary Refill: Capillary refill takes less than 2 seconds.     Coloration: Skin is not jaundiced or pale.     Findings: No bruising, erythema, lesion or rash.  Neurological:     General: No focal deficit present.     Mental Status: He is alert and oriented to person, place, and time.     Cranial Nerves: No cranial nerve deficit.     Sensory: No sensory deficit.     Motor: No weakness.     Coordination: Coordination normal.     Gait: Gait normal.     Deep Tendon Reflexes: Reflexes normal.  Psychiatric:        Mood and Affect: Mood normal.        Behavior: Behavior normal.        Thought Content: Thought content normal.        Judgment: Judgment normal.     Results for orders placed or performed in visit on 09/23/21  Comprehensive metabolic panel  Result Value Ref Range   Glucose 246 (H) 70 - 99 mg/dL   BUN 10 6 - 24 mg/dL   Creatinine, Ser 0.81 0.76 - 1.27 mg/dL   eGFR 104 >59 mL/min/1.73   BUN/Creatinine Ratio 12 9 - 20   Sodium 138 134 - 144 mmol/L   Potassium 4.1 3.5 - 5.2 mmol/L   Chloride 97 96 - 106 mmol/L   CO2 22 20 - 29 mmol/L   Calcium 9.3 8.7 - 10.2 mg/dL   Total Protein 6.7 6.0 - 8.5 g/dL   Albumin 4.3 3.8 - 4.9 g/dL   Globulin, Total 2.4 1.5 - 4.5 g/dL   Albumin/Globulin Ratio 1.8 1.2 - 2.2   Bilirubin Total 0.5 0.0 - 1.2 mg/dL   Alkaline Phosphatase 51 44 - 121 IU/L   AST 22 0 - 40 IU/L   ALT 24 0 - 44 IU/L  CBC with Differential/Platelet  Result Value Ref Range   WBC 6.6 3.4 - 10.8 x10E3/uL   RBC 5.19 4.14 - 5.80 x10E6/uL   Hemoglobin 14.2 13.0 - 17.7 g/dL    Hematocrit 44.1 37.5 - 51.0 %   MCV 85 79 - 97 fL   MCH 27.4 26.6 - 33.0 pg   MCHC 32.2 31.5 - 35.7 g/dL   RDW 14.6 11.6 - 15.4 %   Platelets 201 150 - 450 x10E3/uL   Neutrophils 72 Not Estab. %   Lymphs 17 Not Estab. %   Monocytes 7 Not Estab. %   Eos 2 Not Estab. %   Basos 1 Not Estab. %   Neutrophils Absolute 4.9 1.4 - 7.0 x10E3/uL   Lymphocytes Absolute 1.1 0.7 - 3.1 x10E3/uL   Monocytes Absolute 0.4 0.1 - 0.9 x10E3/uL   EOS (ABSOLUTE) 0.1 0.0 - 0.4 x10E3/uL   Basophils Absolute 0.0 0.0 - 0.2 x10E3/uL   Immature Granulocytes 1 Not Estab. %   Immature Grans (Abs) 0.0 0.0 - 0.1 x10E3/uL  Lipid Panel w/o Chol/HDL Ratio  Result Value Ref Range  Cholesterol, Total 104 100 - 199 mg/dL   Triglycerides 167 (H) 0 - 149 mg/dL   HDL 30 (L) >39 mg/dL   VLDL Cholesterol Cal 28 5 - 40 mg/dL   LDL Chol Calc (NIH) 46 0 - 99 mg/dL  PSA  Result Value Ref Range   Prostate Specific Ag, Serum 0.7 0.0 - 4.0 ng/mL  TSH  Result Value Ref Range   TSH 3.020 0.450 - 4.500 uIU/mL  Urinalysis, Routine w reflex microscopic  Result Value Ref Range   Specific Gravity, UA 1.020 1.005 - 1.030   pH, UA 5.5 5.0 - 7.5   Color, UA Yellow Yellow   Appearance Ur Clear Clear   Leukocytes,UA Negative Negative   Protein,UA Negative Negative/Trace   Glucose, UA 3+ (A) Negative   Ketones, UA Negative Negative   RBC, UA Negative Negative   Bilirubin, UA Negative Negative   Urobilinogen, Ur 0.2 0.2 - 1.0 mg/dL   Nitrite, UA Negative Negative  Microalbumin, Urine Waived  Result Value Ref Range   Microalb, Ur Waived 10 0 - 19 mg/L   Creatinine, Urine Waived 100 10 - 300 mg/dL   Microalb/Creat Ratio <30 <30 mg/g  Bayer DCA Hb A1c Waived  Result Value Ref Range   HB A1C (BAYER DCA - WAIVED) 7.1 (H) 4.8 - 5.6 %      Assessment & Plan:   Problem List Items Addressed This Visit       Cardiovascular and Mediastinum   Hypertension    Under good control on current regimen. Continue current regimen.  Continue to monitor. Call with any concerns. Refills given. Labs drawn today.       Relevant Medications   amLODipine (NORVASC) 5 MG tablet   atorvastatin (LIPITOR) 80 MG tablet   benazepril (LOTENSIN) 40 MG tablet   metoprolol tartrate (LOPRESSOR) 100 MG tablet   Other Relevant Orders   Comprehensive metabolic panel (Completed)   CBC with Differential/Platelet (Completed)   TSH (Completed)   Microalbumin, Urine Waived (Completed)   Obesity, diabetes, and hypertension syndrome (HCC)    Under good control on current regimen. Continue current regimen. Continue to monitor. Call with any concerns. Refills given.        Relevant Medications   amLODipine (NORVASC) 5 MG tablet   atorvastatin (LIPITOR) 80 MG tablet   benazepril (LOTENSIN) 40 MG tablet   Dulaglutide (TRULICITY) 3 BX/4.3HW SOPN   metFORMIN (GLUCOPHAGE) 500 MG tablet   metoprolol tartrate (LOPRESSOR) 100 MG tablet     Endocrine   Type 2 diabetes mellitus with stage 3 chronic kidney disease, without long-term current use of insulin (HCC)    Up slightly with A1c of 7.1. Will continue to work on diet and exercise. Continue to monitor. Call with any concerns. Refills given.       Relevant Medications   atorvastatin (LIPITOR) 80 MG tablet   benazepril (LOTENSIN) 40 MG tablet   Dulaglutide (TRULICITY) 3 YS/1.6OH SOPN   metFORMIN (GLUCOPHAGE) 500 MG tablet   Other Relevant Orders   Comprehensive metabolic panel (Completed)   CBC with Differential/Platelet (Completed)   Microalbumin, Urine Waived (Completed)   Bayer DCA Hb A1c Waived (Completed)     Genitourinary   CKD (chronic kidney disease), stage II    Rechecking labs today. Await results. Treat as needed.       Relevant Orders   Comprehensive metabolic panel (Completed)   CBC with Differential/Platelet (Completed)   Urinalysis, Routine w reflex microscopic (Completed)     Other  Hyperlipidemia    Under good control on current regimen. Continue current  regimen. Continue to monitor. Call with any concerns. Refills given. Labs drawn today.       Relevant Medications   amLODipine (NORVASC) 5 MG tablet   atorvastatin (LIPITOR) 80 MG tablet   benazepril (LOTENSIN) 40 MG tablet   metoprolol tartrate (LOPRESSOR) 100 MG tablet   Other Relevant Orders   Comprehensive metabolic panel (Completed)   CBC with Differential/Platelet (Completed)   Lipid Panel w/o Chol/HDL Ratio (Completed)   Other Visit Diagnoses     Routine general medical examination at a health care facility    -  Primary   Vaccines up to date. Screening labs checked today. Colonoscopy up to date. Continue diet and exercise. Call with any concerns.    Hesitancy       Labs drawn today. Await results.    Relevant Orders   Comprehensive metabolic panel (Completed)   CBC with Differential/Platelet (Completed)   PSA (Completed)   Need for influenza vaccination       Relevant Orders   Flu Vaccine QUAD 67moIM (Fluarix, Fluzone & Alfiuria Quad PF) (Completed)   Essential hypertension       Relevant Medications   amLODipine (NORVASC) 5 MG tablet   atorvastatin (LIPITOR) 80 MG tablet   benazepril (LOTENSIN) 40 MG tablet   metoprolol tartrate (LOPRESSOR) 100 MG tablet        Discussed aspirin prophylaxis for myocardial infarction prevention and decision was made to continue ASA  LABORATORY TESTING:  Health maintenance labs ordered today as discussed above.   The natural history of prostate cancer and ongoing controversy regarding screening and potential treatment outcomes of prostate cancer has been discussed with the patient. The meaning of a false positive PSA and a false negative PSA has been discussed. He indicates understanding of the limitations of this screening test and wishes to proceed with screening PSA testing.   IMMUNIZATIONS:   - Tdap: Tetanus vaccination status reviewed: last tetanus booster within 10 years. - Influenza: Administered today - Pneumovax: Up to  date - Prevnar: Not applicable - COVID: Up to date - HPV: Not applicable - Shingrix vaccine: Administered today  SCREENING: - Colonoscopy: Up to date  Discussed with patient purpose of the colonoscopy is to detect colon cancer at curable precancerous or early stages    PATIENT COUNSELING:    Sexuality: Discussed sexually transmitted diseases, partner selection, use of condoms, avoidance of unintended pregnancy  and contraceptive alternatives.   Advised to avoid cigarette smoking.  I discussed with the patient that most people either abstain from alcohol or drink within safe limits (<=14/week and <=4 drinks/occasion for males, <=7/weeks and <= 3 drinks/occasion for females) and that the risk for alcohol disorders and other health effects rises proportionally with the number of drinks per week and how often a drinker exceeds daily limits.  Discussed cessation/primary prevention of drug use and availability of treatment for abuse.   Diet: Encouraged to adjust caloric intake to maintain  or achieve ideal body weight, to reduce intake of dietary saturated fat and total fat, to limit sodium intake by avoiding high sodium foods and not adding table salt, and to maintain adequate dietary potassium and calcium preferably from fresh fruits, vegetables, and low-fat dairy products.    stressed the importance of regular exercise  Injury prevention: Discussed safety belts, safety helmets, smoke detector, smoking near bedding or upholstery.   Dental health: Discussed importance of regular tooth brushing, flossing, and dental  visits.   Follow up plan: NEXT PREVENTATIVE PHYSICAL DUE IN 1 YEAR. Return in about 3 months (around 12/23/2021).

## 2021-09-24 ENCOUNTER — Ambulatory Visit: Payer: Self-pay

## 2021-09-24 DIAGNOSIS — M545 Low back pain, unspecified: Secondary | ICD-10-CM | POA: Diagnosis not present

## 2021-09-24 DIAGNOSIS — M5416 Radiculopathy, lumbar region: Secondary | ICD-10-CM | POA: Diagnosis not present

## 2021-09-24 LAB — COMPREHENSIVE METABOLIC PANEL
ALT: 24 IU/L (ref 0–44)
AST: 22 IU/L (ref 0–40)
Albumin/Globulin Ratio: 1.8 (ref 1.2–2.2)
Albumin: 4.3 g/dL (ref 3.8–4.9)
Alkaline Phosphatase: 51 IU/L (ref 44–121)
BUN/Creatinine Ratio: 12 (ref 9–20)
BUN: 10 mg/dL (ref 6–24)
Bilirubin Total: 0.5 mg/dL (ref 0.0–1.2)
CO2: 22 mmol/L (ref 20–29)
Calcium: 9.3 mg/dL (ref 8.7–10.2)
Chloride: 97 mmol/L (ref 96–106)
Creatinine, Ser: 0.81 mg/dL (ref 0.76–1.27)
Globulin, Total: 2.4 g/dL (ref 1.5–4.5)
Glucose: 246 mg/dL — ABNORMAL HIGH (ref 70–99)
Potassium: 4.1 mmol/L (ref 3.5–5.2)
Sodium: 138 mmol/L (ref 134–144)
Total Protein: 6.7 g/dL (ref 6.0–8.5)
eGFR: 104 mL/min/{1.73_m2} (ref >59)

## 2021-09-24 LAB — CBC WITH DIFFERENTIAL/PLATELET
Basophils Absolute: 0 10*3/uL (ref 0.0–0.2)
Basos: 1 %
EOS (ABSOLUTE): 0.1 10*3/uL (ref 0.0–0.4)
Eos: 2 %
Hematocrit: 44.1 % (ref 37.5–51.0)
Hemoglobin: 14.2 g/dL (ref 13.0–17.7)
Immature Grans (Abs): 0 10*3/uL (ref 0.0–0.1)
Immature Granulocytes: 1 %
Lymphocytes Absolute: 1.1 10*3/uL (ref 0.7–3.1)
Lymphs: 17 %
MCH: 27.4 pg (ref 26.6–33.0)
MCHC: 32.2 g/dL (ref 31.5–35.7)
MCV: 85 fL (ref 79–97)
Monocytes Absolute: 0.4 10*3/uL (ref 0.1–0.9)
Monocytes: 7 %
Neutrophils Absolute: 4.9 10*3/uL (ref 1.4–7.0)
Neutrophils: 72 %
Platelets: 201 10*3/uL (ref 150–450)
RBC: 5.19 x10E6/uL (ref 4.14–5.80)
RDW: 14.6 % (ref 11.6–15.4)
WBC: 6.6 10*3/uL (ref 3.4–10.8)

## 2021-09-24 LAB — LIPID PANEL W/O CHOL/HDL RATIO
Cholesterol, Total: 104 mg/dL (ref 100–199)
HDL: 30 mg/dL — ABNORMAL LOW (ref >39)
LDL Chol Calc (NIH): 46 mg/dL (ref 0–99)
Triglycerides: 167 mg/dL — ABNORMAL HIGH (ref 0–149)
VLDL Cholesterol Cal: 28 mg/dL (ref 5–40)

## 2021-09-24 LAB — PSA: Prostate Specific Ag, Serum: 0.7 ng/mL (ref 0.0–4.0)

## 2021-09-24 LAB — TSH: TSH: 3.02 u[IU]/mL (ref 0.450–4.500)

## 2021-09-24 NOTE — Patient Outreach (Signed)
  Care Coordination   Follow Up Visit Note   09/24/2021 Name: Joshua Lee MRN: [id number] DOB: 1965/03/26  Joshua Lee is a 56 y.o. year old male who sees [name], DO for primary care. I spoke with  Joshua Board Joyce by phone today.  What matters to the patients health and wellness today?  "This is the worst pain I have ever had"    Goals Addressed             This Visit's Progress    RNCM: "Having bad pain"       Care Coordination Interventions:  09-24-2021: The patient rates his pain level at a 7 today on a scale of 0-10 in his right thigh. The patient says this is the worst pain he has ever dealt with. Reviewed provider established plan for pain management. The patient is having pain in his right thigh. He is going to [facility] and he is taking PT. He will have PT for 6 weeks. He cannot tell if it is helping yet or not. Education and support given. Discussed importance of adherence to all scheduled medical appointments. Saw pcp on 09-01-2021. Will see the provider at [facility] tomorrow and again on 10-21-2021 Counseled on the importance of reporting any/all new or changed pain symptoms or management strategies to pain management provider Advised patient to report to care team affect of pain on daily activities Discussed use of relaxation techniques and/or diversional activities to assist with pain reduction (distraction, imagery, relaxation, massage, acupressure, TENS, heat, and cold application. The patient is using cold application Reviewed with patient prescribed pharmacological and nonpharmacological pain relief strategies. The patient is compliant with medications Advised patient to discuss unresolved pain, changes in level or intensity of pain with provider Screening for signs and symptoms of depression related to chronic disease state  Assessed social determinant of health barriers Active listening / Reflection utilized  Emotional Support Provided  09-24-2021:  The patient agrees to work with the [facility] in the care coordination program. Has contact information to call the RNCM between outreaches as needed        SDOH assessments and interventions completed:  Yes  SDOH Interventions Today    Flowsheet Row Most Recent Value  SDOH Interventions   Food Insecurity Interventions Intervention Not Indicated  Housing Interventions Intervention Not Indicated  Transportation Interventions Intervention Not Indicated  Utilities Interventions Intervention Not Indicated  Alcohol Usage Interventions Intervention Not Indicated (Score <7)  Financial Strain Interventions Intervention Not Indicated        Care Coordination Interventions Activated:  Yes  Care Coordination Interventions:  Yes, provided   Follow up plan: Follow up call scheduled for 12-03-2021 at 330 pm    Encounter Outcome:  Pt. Visit Completed   Alto Denver RN, MSN, CCM Community Care Coordinator [facility]  [facility] Mobile: [phone]

## 2021-09-24 NOTE — Patient Instructions (Signed)
Visit Information  Thank you for taking time to visit with me today. Please don't hesitate to contact me if I can be of assistance to you.   Following are the goals we discussed today:   Goals Addressed             This Visit's Progress    RNCM: "Having bad pain"       Care Coordination Interventions:  09-24-2021: The patient rates his pain level at a 7 today on a scale of 0-10 in his right thigh. The patient says this is the worst pain he has ever dealt with. Reviewed provider established plan for pain management. The patient is having pain in his right thigh. He is going to [facility] and he is taking PT. He will have PT for 6 weeks. He cannot tell if it is helping yet or not. Education and support given. Discussed importance of adherence to all scheduled medical appointments. Saw pcp on 09-01-2021. Will see the provider at [facility] tomorrow and again on 10-21-2021 Counseled on the importance of reporting any/all new or changed pain symptoms or management strategies to pain management provider Advised patient to report to care team affect of pain on daily activities Discussed use of relaxation techniques and/or diversional activities to assist with pain reduction (distraction, imagery, relaxation, massage, acupressure, TENS, heat, and cold application. The patient is using cold application Reviewed with patient prescribed pharmacological and nonpharmacological pain relief strategies. The patient is compliant with medications Advised patient to discuss unresolved pain, changes in level or intensity of pain with provider Screening for signs and symptoms of depression related to chronic disease state  Assessed social determinant of health barriers Active listening / Reflection utilized  Emotional Support Provided  09-24-2021: The patient agrees to work with the [facility] in the care coordination program. Has contact information to call the [facility] between outreaches as needed        Our next  appointment is by telephone on 12-03-2021 at 330 pm  Please call the care guide team at [phone] if you need to cancel or reschedule your appointment.   If you are experiencing a Mental Health or Behavioral Health Crisis or need someone to talk to, please call the Suicide and Crisis Lifeline: 988 call the [place] National Suicide Prevention Lifeline: 4693424897 or TTY: 423 270 7760 TTY (774) 732-5371) to talk to a trained counselor call 1-800-273-TALK (toll free, 24 hour hotline)  The patient verbalized understanding of instructions, educational materials, and care plan provided today and DECLINED offer to receive copy of patient instructions, educational materials, and care plan.   Telephone follow up appointment with care management team member scheduled for: 12-03-2021 at 330 pm  Alto Denver RN, MSN, CCM [facility] Coordinator [facility]  [facility] Mobile: [phone]

## 2021-09-24 NOTE — Telephone Encounter (Signed)
Requested medication (s) are due for refill today - no  Requested medication (s) are on the active medication list -yes  Future visit scheduled -yes  Last refill: 09/23/21 #360 1RF  Notes to clinic: Pharmacy request:Alternative Requested, or PA needed for name brand   Requested Prescriptions  Pending Prescriptions Disp Refills   icosapent Ethyl (VASCEPA) 1 g capsule [Pharmacy Med Name: ICOSAPENT ETHYL 1 GRAM CAPSULE]  0    Sig: Take 2 capsules (2 g total) by mouth 2 (two) times daily.     Off-Protocol Failed - 09/23/2021 11:22 AM      Failed - Medication not assigned to a protocol, review manually.      Passed - Valid encounter within last 12 months    Recent Outpatient Visits           Yesterday Primary hypertension   [facility] [place], [place], DO   6 months ago Primary hypertension   [facility] [place], [name], DO   1 year ago Routine general medical examination at a health care facility   [facility], [place] P, DO   1 year ago Abdominal pain, unspecified abdominal location   [facility] Vigg, Avanti, MD   1 year ago Type 2 diabetes mellitus with stage 3 chronic kidney disease, without long-term current use of insulin, unspecified whether stage 3a or 3b CKD (HCC)   [facility] [place], [place], DO       Future Appointments             In 3 months Johnson, [name], DO [facility], [facility]               Requested Prescriptions  Pending Prescriptions Disp Refills   icosapent Ethyl (VASCEPA) 1 g capsule [Pharmacy Med Name: ICOSAPENT ETHYL 1 GRAM CAPSULE]  0    Sig: Take 2 capsules (2 g total) by mouth 2 (two) times daily.     Off-Protocol Failed - 09/23/2021 11:22 AM      Failed - Medication not assigned to a protocol, review manually.      Passed - Valid encounter within last 12 months    Recent Outpatient Visits           Yesterday Primary hypertension   [facility] [place], [place], DO   6 months ago Primary hypertension   [facility] [place], [name], DO   1 year ago Routine general medical examination at a health care facility   [facility], [place] P, DO   1 year ago Abdominal pain, unspecified abdominal location   [facility] Vigg, Avanti, MD   1 year ago Type 2 diabetes mellitus with stage 3 chronic kidney disease, without long-term current use of insulin, unspecified whether stage 3a or 3b CKD [facility])   [facility] [place], Oralia Rud, DO       Future Appointments             In 3 months Johnson, Oralia Rud, DO [facility], [facility]

## 2021-09-25 ENCOUNTER — Encounter: Payer: Self-pay | Admitting: Family Medicine

## 2021-09-25 NOTE — Assessment & Plan Note (Signed)
Under good control on current regimen. Continue current regimen. Continue to monitor. Call with any concerns. Refills given.   

## 2021-09-25 NOTE — Assessment & Plan Note (Signed)
Under good control on current regimen. Continue current regimen. Continue to monitor. Call with any concerns. Refills given. Labs drawn today.   

## 2021-09-25 NOTE — Assessment & Plan Note (Signed)
Rechecking labs today. Await results. Treat as needed.  °

## 2021-09-25 NOTE — Assessment & Plan Note (Signed)
Up slightly with A1c of 7.1. Will continue to work on diet and exercise. Continue to monitor. Call with any concerns. Refills given.

## 2021-10-05 ENCOUNTER — Telehealth: Payer: Self-pay

## 2021-10-05 NOTE — Telephone Encounter (Signed)
Discussed letter with lab results- pt did receive letter.

## 2021-10-06 DIAGNOSIS — M545 Low back pain, unspecified: Secondary | ICD-10-CM | POA: Diagnosis not present

## 2021-10-06 DIAGNOSIS — M5416 Radiculopathy, lumbar region: Secondary | ICD-10-CM | POA: Diagnosis not present

## 2021-10-09 DIAGNOSIS — M545 Low back pain, unspecified: Secondary | ICD-10-CM | POA: Diagnosis not present

## 2021-10-09 DIAGNOSIS — M5416 Radiculopathy, lumbar region: Secondary | ICD-10-CM | POA: Diagnosis not present

## 2021-10-13 ENCOUNTER — Ambulatory Visit: Payer: Self-pay

## 2021-10-13 DIAGNOSIS — M545 Low back pain, unspecified: Secondary | ICD-10-CM | POA: Diagnosis not present

## 2021-10-13 DIAGNOSIS — M5416 Radiculopathy, lumbar region: Secondary | ICD-10-CM | POA: Diagnosis not present

## 2021-10-13 NOTE — Patient Instructions (Signed)
Visit Information  Thank you for taking time to visit with me today. Please don't hesitate to contact me if I can be of assistance to you.   Following are the goals we discussed today:   Goals Addressed             This Visit's Progress    RNCM: Effective Management of DM       Care Coordination Interventions:  Lab Results  Component Value Date   HGBA1C 7.1 (H) 09/23/2021    Provided education to patient about basic DM disease process Reviewed medications with patient and discussed importance of medication adherence Counseled on importance of regular laboratory monitoring as prescribed Discussed plans with patient for ongoing care management follow up and provided patient with direct contact information for care management team Provided patient with written educational materials related to hypo and hyperglycemia and importance of correct treatment Reviewed scheduled/upcoming provider appointments including: 12-23-2021 at 1020 am Advised patient, providing education and rationale, to check cbg as directed and record, calling pcp for findings outside established parameters Referral made to pharmacy team for assistance with discussed talking to the pharmacist about the cost of Trulicity and the patient states not to do that he will pay the $192.00. Education on deductibles and he has talked to [employer] and the pharmacy. Will let the RNCM know if additional referrals need to be made.   Review of patient status, including review of consultants reports, relevant laboratory and other test results, and medications completed Screening for signs and symptoms of depression related to chronic disease state  Assessed social determinant of health barriers  Active listening / Reflection utilized  Emotional Support Provided         Our next appointment is by telephone on 12-03-2021 at 330 pm  Please call the care guide team at [phone] if you need to cancel or reschedule your  appointment.   If you are experiencing a Mental Health or [place] or need someone to talk to, please call the Suicide and Crisis Lifeline: 988 call the [place] National Suicide Prevention Lifeline: 3013034131 or TTY: 228-865-8469 TTY (360) 784-3382) to talk to a trained counselor call 1-800-273-TALK (toll free, 24 hour hotline)  The patient verbalized understanding of instructions, educational materials, and care plan provided today and DECLINED offer to receive copy of patient instructions, educational materials, and care plan.   Telephone follow up appointment with care management team member scheduled for: 12-03-2021 at 330 pm.  Noreene Larsson RN, MSN, [place]  Mobile: [phone]

## 2021-10-13 NOTE — Patient Outreach (Signed)
  Care Coordination   Follow Up Visit Note   10/13/2021 Name: Joshua Lee MRN: [id number] DOB: 04/25/1965  Joshua Lee is a 56 y.o. year old male who sees Joshua Roys, DO for primary care. I spoke with  Joshua Lee by phone today.  What matters to the patients health and wellness today?  Price of Trulicity is going to be $192.00    Goals Addressed             This Visit's Progress    RNCM: Effective Management of DM       Care Coordination Interventions:  Lab Results  Component Value Date   HGBA1C 7.1 (H) 09/23/2021    Provided education to patient about basic DM disease process Reviewed medications with patient and discussed importance of medication adherence Counseled on importance of regular laboratory monitoring as prescribed Discussed plans with patient for ongoing care management follow up and provided patient with direct contact information for care management team Provided patient with written educational materials related to hypo and hyperglycemia and importance of correct treatment Reviewed scheduled/upcoming provider appointments including: 12-23-2021 at 1020 am Advised patient, providing education and rationale, to check cbg as directed and record, calling pcp for findings outside established parameters Referral made to pharmacy team for assistance with discussed talking to the pharmacist about the cost of Trulicity and the patient states not to do that he will pay the $192.00. Education on deductibles and he has talked to [employer] and the pharmacy. Will let the RNCM know if additional referrals need to be made.   Review of patient status, including review of consultants reports, relevant laboratory and other test results, and medications completed Screening for signs and symptoms of depression related to chronic disease state  Assessed social determinant of health barriers  Active listening / Reflection utilized  Emotional Support  Provided         SDOH assessments and interventions completed:  No     Care Coordination Interventions Activated:  Yes  Care Coordination Interventions:  Yes, provided   Follow up plan: Follow up call scheduled for 12-03-2021 at 330 pm    Encounter Outcome:  Pt. Visit Completed   Joshua Larsson RN, MSN, [facility]  Mobile: [phone]

## 2021-10-16 DIAGNOSIS — M545 Low back pain, unspecified: Secondary | ICD-10-CM | POA: Diagnosis not present

## 2021-10-16 DIAGNOSIS — M5416 Radiculopathy, lumbar region: Secondary | ICD-10-CM | POA: Diagnosis not present

## 2021-10-19 DIAGNOSIS — M545 Low back pain, unspecified: Secondary | ICD-10-CM | POA: Diagnosis not present

## 2021-10-19 DIAGNOSIS — M5416 Radiculopathy, lumbar region: Secondary | ICD-10-CM | POA: Diagnosis not present

## 2021-10-21 DIAGNOSIS — M5416 Radiculopathy, lumbar region: Secondary | ICD-10-CM | POA: Diagnosis not present

## 2021-10-21 DIAGNOSIS — M545 Low back pain, unspecified: Secondary | ICD-10-CM | POA: Diagnosis not present

## 2021-10-21 DIAGNOSIS — M7061 Trochanteric bursitis, right hip: Secondary | ICD-10-CM | POA: Diagnosis not present

## 2021-10-27 DIAGNOSIS — M5416 Radiculopathy, lumbar region: Secondary | ICD-10-CM | POA: Diagnosis not present

## 2021-10-27 DIAGNOSIS — M545 Low back pain, unspecified: Secondary | ICD-10-CM | POA: Diagnosis not present

## 2021-10-28 DIAGNOSIS — M9905 Segmental and somatic dysfunction of pelvic region: Secondary | ICD-10-CM | POA: Diagnosis not present

## 2021-10-28 DIAGNOSIS — M9903 Segmental and somatic dysfunction of lumbar region: Secondary | ICD-10-CM | POA: Diagnosis not present

## 2021-10-28 DIAGNOSIS — M5136 Other intervertebral disc degeneration, lumbar region: Secondary | ICD-10-CM | POA: Diagnosis not present

## 2021-10-28 DIAGNOSIS — M5416 Radiculopathy, lumbar region: Secondary | ICD-10-CM | POA: Diagnosis not present

## 2021-11-03 DIAGNOSIS — M5416 Radiculopathy, lumbar region: Secondary | ICD-10-CM | POA: Diagnosis not present

## 2021-11-03 DIAGNOSIS — M9903 Segmental and somatic dysfunction of lumbar region: Secondary | ICD-10-CM | POA: Diagnosis not present

## 2021-11-03 DIAGNOSIS — M5136 Other intervertebral disc degeneration, lumbar region: Secondary | ICD-10-CM | POA: Diagnosis not present

## 2021-11-03 DIAGNOSIS — M9905 Segmental and somatic dysfunction of pelvic region: Secondary | ICD-10-CM | POA: Diagnosis not present

## 2021-11-06 DIAGNOSIS — M545 Low back pain, unspecified: Secondary | ICD-10-CM | POA: Diagnosis not present

## 2021-11-06 DIAGNOSIS — M5416 Radiculopathy, lumbar region: Secondary | ICD-10-CM | POA: Diagnosis not present

## 2021-11-12 DIAGNOSIS — M545 Low back pain, unspecified: Secondary | ICD-10-CM | POA: Diagnosis not present

## 2021-11-12 DIAGNOSIS — M48061 Spinal stenosis, lumbar region without neurogenic claudication: Secondary | ICD-10-CM | POA: Diagnosis not present

## 2021-11-13 ENCOUNTER — Other Ambulatory Visit: Payer: Self-pay | Admitting: Family Medicine

## 2021-11-13 NOTE — Telephone Encounter (Signed)
Requested medication (s) are due for refill today: yes  Requested medication (s) are on the active medication list: yes  Last refill:  10/13/21  Future visit scheduled: yes  Notes to clinic:  Unable to refill per protocol, cannot delegate.      Requested Prescriptions  Pending Prescriptions Disp Refills   cyclobenzaprine (FLEXERIL) 10 MG tablet [Pharmacy Med Name: CYCLOBENZAPRINE 10 MG TABLET] 30 tablet 1    Sig: TAKE 1 TABLET BY MOUTH EVERYDAY AT BEDTIME     Not Delegated - Analgesics:  Muscle Relaxants Failed - 11/13/2021  9:43 AM      Failed - This refill cannot be delegated      Passed - Valid encounter within last 6 months    Recent Outpatient Visits           1 month ago Routine general medical examination at a health care facility   [facility] [place] P, DO   8 months ago Primary hypertension   [place], [name], DO   1 year ago Routine general medical examination at a health care facility   [facility] [employer], DO   1 year ago Abdominal pain, unspecified abdominal location   [facility] Vigg, Avanti, MD   1 year ago Type 2 diabetes mellitus with stage 3 chronic kidney disease, without long-term current use of insulin, unspecified whether stage 3a or 3b CKD [facility])   [place], Barb Merino, DO       Future Appointments             In 1 month Johnson, Barb Merino, DO [place], [facility]

## 2021-11-20 DIAGNOSIS — M5416 Radiculopathy, lumbar region: Secondary | ICD-10-CM | POA: Diagnosis not present

## 2021-11-20 DIAGNOSIS — E119 Type 2 diabetes mellitus without complications: Secondary | ICD-10-CM | POA: Diagnosis not present

## 2021-11-30 ENCOUNTER — Ambulatory Visit: Payer: Self-pay | Admitting: *Deleted

## 2021-11-30 NOTE — Patient Outreach (Signed)
  Care Coordination   11/30/2021 Name: Joshua Lee MRN: [id number] DOB: 03-15-1965   Care Coordination Outreach Attempts:  An unsuccessful telephone outreach was attempted for a scheduled appointment today.  Follow Up Plan:  Additional outreach attempts will be made to offer the patient care coordination information and services.   Encounter Outcome:  No Answer  Care Coordination Interventions Activated:  No   Care Coordination Interventions:  No, not indicated    Kemper Durie, RN, MSN, [facility] Care Management Care Management Coordinator [phone]

## 2021-12-02 DIAGNOSIS — M5416 Radiculopathy, lumbar region: Secondary | ICD-10-CM | POA: Diagnosis not present

## 2021-12-02 DIAGNOSIS — M9903 Segmental and somatic dysfunction of lumbar region: Secondary | ICD-10-CM | POA: Diagnosis not present

## 2021-12-02 DIAGNOSIS — M9905 Segmental and somatic dysfunction of pelvic region: Secondary | ICD-10-CM | POA: Diagnosis not present

## 2021-12-02 DIAGNOSIS — M5136 Other intervertebral disc degeneration, lumbar region: Secondary | ICD-10-CM | POA: Diagnosis not present

## 2021-12-03 ENCOUNTER — Encounter: Payer: 59 | Admitting: *Deleted

## 2021-12-03 DIAGNOSIS — E782 Mixed hyperlipidemia: Secondary | ICD-10-CM | POA: Diagnosis not present

## 2021-12-03 DIAGNOSIS — M5136 Other intervertebral disc degeneration, lumbar region: Secondary | ICD-10-CM | POA: Diagnosis not present

## 2021-12-03 DIAGNOSIS — M5416 Radiculopathy, lumbar region: Secondary | ICD-10-CM | POA: Diagnosis not present

## 2021-12-03 DIAGNOSIS — M5126 Other intervertebral disc displacement, lumbar region: Secondary | ICD-10-CM | POA: Diagnosis not present

## 2021-12-03 DIAGNOSIS — M545 Low back pain, unspecified: Secondary | ICD-10-CM | POA: Diagnosis not present

## 2021-12-03 DIAGNOSIS — I10 Essential (primary) hypertension: Secondary | ICD-10-CM | POA: Diagnosis not present

## 2021-12-03 DIAGNOSIS — M48061 Spinal stenosis, lumbar region without neurogenic claudication: Secondary | ICD-10-CM | POA: Diagnosis not present

## 2021-12-03 DIAGNOSIS — E119 Type 2 diabetes mellitus without complications: Secondary | ICD-10-CM | POA: Diagnosis not present

## 2021-12-03 DIAGNOSIS — I2510 Atherosclerotic heart disease of native coronary artery without angina pectoris: Secondary | ICD-10-CM | POA: Diagnosis not present

## 2021-12-03 DIAGNOSIS — E669 Obesity, unspecified: Secondary | ICD-10-CM | POA: Diagnosis not present

## 2021-12-15 ENCOUNTER — Ambulatory Visit: Payer: Self-pay | Admitting: *Deleted

## 2021-12-15 NOTE — Patient Outreach (Signed)
  Care Coordination   Follow Up Visit Note   12/15/2021 Name: Joshua Lee MRN: [id number] DOB: 1965-04-21  Joshua Lee is a 56 y.o. year old male who sees Joshua Carrow, DO for primary care. I spoke with  Joshua Lee by phone today.  What matters to the patients health and wellness today?  Back pain and concern for inability to get [place] starting January.    Goals Addressed             This Visit's Progress    RNCM: "Having bad pain"   On track    Care Coordination Interventions:  Reviewed provider established plan for pain management. Education and support given. Discussed importance of adherence to all scheduled medical appointments. Will see ortho specialist again, state he is waiting on approval to have 2nd injection for pain control. Counseled on the importance of reporting any/all new or changed pain symptoms or management strategies to pain management provider Advised patient to report to care team affect of pain on daily activities Discussed use of relaxation techniques and/or diversional activities to assist with pain reduction (distraction, imagery, relaxation, massage, acupressure, TENS, heat, and cold application. The patient is using cold application Reviewed with patient prescribed pharmacological and nonpharmacological pain relief strategies. The patient is compliant with medications Advised patient to discuss unresolved pain, changes in level or intensity of pain with provider Active listening / Reflection utilized  Emotional Support Provided  The patient agrees to work with the [facility] in the care coordination program. Has contact information to call the RNCM between outreaches as needed     RNCM: Effective Management of DM   On track    Care Coordination Interventions:  Lab Results  Component Value Date   HGBA1C 7.1 (H) 09/23/2021    Provided education to patient about basic DM disease process Reviewed medications with patient and discussed  importance of medication adherence Counseled on importance of regular laboratory monitoring as prescribed Discussed plans with patient for ongoing care management follow up and provided patient with direct contact information for care management team Reviewed scheduled/upcoming provider appointments including: 12-23-2021 at 1020 am Advised patient, providing education and rationale, to check cbg as directed and record, calling pcp for findings outside established parameters Review of patient status, including review of consultants reports, relevant laboratory and other test results, and medications completed  Active listening / Reflection utilized  Emotional Support Provided Report his insurance will no longer cover farxiga starting January. He has information that is needed to send to company via provider for authorization, however information can't be sent until after 12/15.  Will assist with getting medication approved at that time. Report blood sugars range low 100s to 150s.        SDOH assessments and interventions completed:  No     Care Coordination Interventions:  Yes, provided   Follow up plan: Follow up call scheduled for 12/19    Encounter Outcome:  Pt. Visit Completed   Kemper Durie, RN, MSN, [facility] Care Management Care Management Coordinator [phone]

## 2021-12-23 ENCOUNTER — Encounter: Payer: Self-pay | Admitting: Family Medicine

## 2021-12-23 ENCOUNTER — Ambulatory Visit (INDEPENDENT_AMBULATORY_CARE_PROVIDER_SITE_OTHER): Payer: 59 | Admitting: Family Medicine

## 2021-12-23 VITALS — BP 93/63 | HR 78 | Temp 97.8°F | Ht 67.0 in | Wt 236.6 lb

## 2021-12-23 DIAGNOSIS — N183 Chronic kidney disease, stage 3 unspecified: Secondary | ICD-10-CM | POA: Diagnosis not present

## 2021-12-23 DIAGNOSIS — Z1159 Encounter for screening for other viral diseases: Secondary | ICD-10-CM | POA: Diagnosis not present

## 2021-12-23 DIAGNOSIS — I10 Essential (primary) hypertension: Secondary | ICD-10-CM | POA: Diagnosis not present

## 2021-12-23 DIAGNOSIS — E1122 Type 2 diabetes mellitus with diabetic chronic kidney disease: Secondary | ICD-10-CM | POA: Diagnosis not present

## 2021-12-23 LAB — BAYER DCA HB A1C WAIVED: HB A1C (BAYER DCA - WAIVED): 6.9 % — ABNORMAL HIGH (ref 4.8–5.6)

## 2021-12-23 MED ORDER — AMLODIPINE BESYLATE 2.5 MG PO TABS
2.5000 mg | ORAL_TABLET | Freq: Every day | ORAL | 2 refills | Status: DC
Start: 2021-12-23 — End: 2022-01-20

## 2021-12-23 NOTE — Progress Notes (Signed)
BP 93/63   Pulse 78   Temp 97.8 F (36.6 C) (Oral)   Ht 5\' 7"  (1.702 m)   Wt 236 lb 9.6 oz (107.3 kg)   SpO2 98%   BMI 37.06 kg/m    Subjective:    Patient ID: , male    DOB: 1965-12-14, 56 y.o.   MRN: [id number]  HPI: Joshua Lee is a 56 y.o. male  Chief Complaint  Patient presents with   Diabetes   DIABETES Hypoglycemic episodes:no Polydipsia/polyuria: no Visual disturbance: no Chest pain: no Paresthesias: no Glucose Monitoring: yes  Accucheck frequency: Not Checking Taking Insulin?: no Blood Pressure Monitoring: not checking Retinal Examination: Up to Date Foot Exam: Up to Date Diabetic Education: Completed Pneumovax: Up to Date Influenza: Up to Date Aspirin: yes  HYPERTENSION  Hypertension status:  overtreated   Satisfied with current treatment? no Duration of hypertension: chronic BP monitoring frequency:  not checking BP medication side effects:  no Medication compliance: excellent compliance Previous BP meds: amlodipine, benazepril, metoprolol Aspirin: yes Recurrent headaches: no Visual changes: no Palpitations: no Dyspnea: no Chest pain: no Lower extremity edema: no Dizzy/lightheaded: yes  Relevant past medical, surgical, family and social history reviewed and updated as indicated. Interim medical history since our last visit reviewed. Allergies and medications reviewed and updated.  Review of Systems  Constitutional: Negative.   Respiratory: Negative.    Cardiovascular: Negative.   Gastrointestinal: Negative.   Musculoskeletal: Negative.   Neurological: Negative.   Psychiatric/Behavioral: Negative.      Per HPI unless specifically indicated above     Objective:    BP 93/63   Pulse 78   Temp 97.8 F (36.6 C) (Oral)   Ht 5\' 7"  (1.702 m)   Wt 236 lb 9.6 oz (107.3 kg)   SpO2 98%   BMI 37.06 kg/m   Wt Readings from Last 3 Encounters:  12/23/21 236 lb 9.6 oz (107.3 kg)  09/23/21 244 lb 8 oz (110.9 kg)   09/01/21 242 lb 15.2 oz (110.2 kg)    Physical Exam Vitals and nursing note reviewed.  Constitutional:      General: He is not in acute distress.    Appearance: Normal appearance. He is not ill-appearing, toxic-appearing or diaphoretic.  HENT:     Head: Normocephalic and atraumatic.     Right Ear: External ear normal.     Left Ear: External ear normal.     Nose: Nose normal.     Mouth/Throat:     Mouth: Mucous membranes are moist.     Pharynx: Oropharynx is clear.  Eyes:     General: No scleral icterus.       Right eye: No discharge.        Left eye: No discharge.     Extraocular Movements: Extraocular movements intact.     Conjunctiva/sclera: Conjunctivae normal.     Pupils: Pupils are equal, round, and reactive to light.  Cardiovascular:     Rate and Rhythm: Normal rate and regular rhythm.     Pulses: Normal pulses.     Heart sounds: Normal heart sounds. No murmur heard.    No friction rub. No gallop.  Pulmonary:     Effort: Pulmonary effort is normal. No respiratory distress.     Breath sounds: Normal breath sounds. No stridor. No wheezing, rhonchi or rales.  Chest:     Chest wall: No tenderness.  Musculoskeletal:        General: Normal range of motion.  Cervical back: Normal range of motion and neck supple.  Skin:    General: Skin is warm and dry.     Capillary Refill: Capillary refill takes less than 2 seconds.     Coloration: Skin is not jaundiced or pale.     Findings: No bruising, erythema, lesion or rash.  Neurological:     General: No focal deficit present.     Mental Status: He is alert and oriented to person, place, and time. Mental status is at baseline.  Psychiatric:        Mood and Affect: Mood normal.        Behavior: Behavior normal.        Thought Content: Thought content normal.        Judgment: Judgment normal.     Results for orders placed or performed in visit on 12/23/21  Bayer DCA Hb A1c Waived  Result Value Ref Range   HB A1C (BAYER  DCA - WAIVED) 6.9 (H) 4.8 - 5.6 %      Assessment & Plan:   Problem List Items Addressed This Visit       Cardiovascular and Mediastinum   Hypertension    Overtreated. Will cut his amlodipine in 1/2 and recheck 1 month. Call with any concerns.       Relevant Medications   amLODipine (NORVASC) 2.5 MG tablet     Endocrine   Type 2 diabetes mellitus with stage 3 chronic kidney disease, without long-term current use of insulin (HCC) - Primary    Stable with A1c of 6.1. Continue current regimen. Call with any concerns. Referral to pharmacy placed to help with cost of meds.       Relevant Orders   Bayer DCA Hb A1c Waived (Completed)   AMB Referral to Pharmacy Medication Management   Other Visit Diagnoses     Encounter for hepatitis C screening test for low risk patient       Labs drawn today. Await results.   Relevant Orders   Hepatitis C Antibody        Follow up plan: Return in about 4 weeks (around 01/20/2022) for follow up BP.

## 2021-12-23 NOTE — Assessment & Plan Note (Signed)
Overtreated. Will cut his amlodipine in 1/2 and recheck 1 month. Call with any concerns.

## 2021-12-23 NOTE — Patient Instructions (Addendum)
Cut your amlodipine in 1/2 to take 2.5mg  daily

## 2021-12-23 NOTE — Assessment & Plan Note (Signed)
Stable with A1c of 6.1. Continue current regimen. Call with any concerns. Referral to pharmacy placed to help with cost of meds.

## 2021-12-24 ENCOUNTER — Encounter: Payer: Self-pay | Admitting: Family Medicine

## 2021-12-24 LAB — HEPATITIS C ANTIBODY: Hep C Virus Ab: NONREACTIVE

## 2021-12-29 ENCOUNTER — Other Ambulatory Visit: Payer: Self-pay | Admitting: Family Medicine

## 2021-12-29 MED ORDER — CONTOUR NEXT TEST VI STRP: ORAL_STRIP | 0 refills | Status: DC

## 2021-12-29 NOTE — Telephone Encounter (Signed)
Requested Prescriptions  Pending Prescriptions Disp Refills   glucose blood (CONTOUR NEXT TEST) test strip 100 strip 0    Sig: USE AS DIRECTED     Endocrinology: Diabetes - Testing Supplies Passed - 12/29/2021  9:53 AM      Passed - Valid encounter within last 12 months    Recent Outpatient Visits           6 days ago Type 2 diabetes mellitus with stage 3 chronic kidney disease, without long-term current use of insulin, unspecified whether stage 3a or 3b CKD (HCC)   [facility] [place], [name], DO   3 months ago Routine general medical examination at a health care facility   [facility], [name], DO   10 months ago Primary hypertension   [facility] [place], [name], DO   1 year ago Routine general medical examination at a health care facility   [facility] [place] P, DO   1 year ago Abdominal pain, unspecified abdominal location   [facility] Loura Pardon, MD       Future Appointments             In 3 weeks Laural Benes, Oralia Rud, DO [facility], [facility]

## 2021-12-29 NOTE — Telephone Encounter (Signed)
Medication Refill - Medication:  CONTOUR NEXT TEST test strip   Has the patient contacted their pharmacy? No.  Preferred Pharmacy (with phone number or street name):  [id number] [place] [address] Phone: [phone]  Fax: [phone]     Has the patient been seen for an appointment in the last year OR does the patient have an upcoming appointment? Yes.    Agent: Please be advised that RX refills may take up to 3 business days. We ask that you follow-up with your pharmacy.  Pt states he only has 1 strip left

## 2021-12-30 ENCOUNTER — Ambulatory Visit: Payer: Self-pay | Admitting: *Deleted

## 2021-12-30 DIAGNOSIS — M5416 Radiculopathy, lumbar region: Secondary | ICD-10-CM | POA: Diagnosis not present

## 2021-12-30 DIAGNOSIS — J029 Acute pharyngitis, unspecified: Secondary | ICD-10-CM | POA: Diagnosis not present

## 2021-12-30 DIAGNOSIS — M9905 Segmental and somatic dysfunction of pelvic region: Secondary | ICD-10-CM | POA: Diagnosis not present

## 2021-12-30 DIAGNOSIS — M9903 Segmental and somatic dysfunction of lumbar region: Secondary | ICD-10-CM | POA: Diagnosis not present

## 2021-12-30 DIAGNOSIS — Z03818 Encounter for observation for suspected exposure to other biological agents ruled out: Secondary | ICD-10-CM | POA: Diagnosis not present

## 2021-12-30 DIAGNOSIS — R600 Localized edema: Secondary | ICD-10-CM | POA: Diagnosis not present

## 2021-12-30 DIAGNOSIS — M5136 Other intervertebral disc degeneration, lumbar region: Secondary | ICD-10-CM | POA: Diagnosis not present

## 2021-12-30 NOTE — Telephone Encounter (Signed)
  Chief Complaint: Feet Swollen Symptoms: Swelling both feet, right greater than left.States "Not bad, feels tight, wearing shoes but my right foot hurts on top".  Pt is evasive historian. States top of right foot swollen more than left. Reports has been on his feet a lot since Saturday,. Reports swelling went down Saturday after resting, back swollen today "On my feet constantly yesterday." States unsure if redness, warmth, can't see, I'm in dept store  trying on shoes."  States BP meds were changed. Took at [facility] yesterday instead of AM, took late today. Frequency: Saturday then again today.  Pertinent Negatives: Patient denies SOB, calf pain. Disposition: [] ED /[] Urgent Care (no appt availability in office) / [x] Appointment(In office/virtual)/ []  [facility]/ [] Home Care/ [] Refused Recommended Disposition /[] [facility]/ []  Follow-up with PCP Additional Notes: Per agent pt would see only Dr. , appt secured prior to triage for 01/15/22. Advised would hear back if could be seen sooner. Care advise provided, unsure if pt understood as he was in a crowded dept store. States he was " .  Repeated care advise many times. Please advise. Reason for Disposition  [1] MILD swelling of both ankles (i.e., pedal edema) AND [2] new-onset or worsening  Answer Assessment - Initial Assessment Questions 1. ONSET: "When did the swelling start?" (e.g., minutes, hours, days)     "Don't know" 2. LOCATION: "What part of the leg is swollen?"  "Are both legs swollen or just one leg?"     Both feet right>left 3. SEVERITY: "How bad is the swelling?" (e.g., localized; mild, moderate, severe)   - Localized: Small area of swelling localized to one leg.   - MILD pedal edema: Swelling limited to foot and ankle, pitting edema < 1/4 inch (6 mm) deep, rest and elevation eliminate most or all swelling.   - MODERATE edema: Swelling of lower leg to knee, pitting edema > 1/4 inch (6 mm)  deep, rest and elevation only partially reduce swelling.   - SEVERE edema: Swelling extends above knee, facial or hand swelling present.      "Looking better" Feet only 4. REDNESS: "Does the swelling look red or infected?"     "Don't know" 5. PAIN: "Is the swelling painful to touch?" If Yes, ask: "How painful is it?"   (Scale 1-10; mild, moderate or severe)     No 6. FEVER: "Do you have a fever?" If Yes, ask: "What is it, how was it measured, and when did it start?"      no 7. CAUSE: "What do you think is causing the leg swelling?"     "On my feet a lot yesterday." 8. MEDICAL HISTORY: "Do you have a history of blood clots (e.g., DVT), cancer, heart failure, kidney disease, or liver failure?"      9. RECURRENT SYMPTOM: "Have you had leg swelling before?" If Yes, ask: "When was the last time?" "What happened that time?"     No 10. OTHER SYMPTOMS: "Do you have any other symptoms?" (e.g., chest pain, difficulty breathing)       No  Protocols used: Leg Swelling and Edema-A-AH

## 2021-12-30 NOTE — Telephone Encounter (Signed)
Message from [place] sent at 12/30/2021  3:22 PM EST  Summary: feet swelling   Pt stated he is experiencing swelling in both of his feet but the top of his right foot stays swollen. Pt scheduled for an appt on 01/15/22 as he insisted on only seeing Dr. Laural Benes. Pt requests that a nurse return his call to discuss. Cb# [phone]          Call History   Type Contact Phone/Fax User  12/30/2021 03:10 PM EST Phone (Incoming) Daris, Anquan Azzarello (Self) [phone] Judie Petit) [place], [place]

## 2021-12-31 NOTE — Telephone Encounter (Signed)
I can see him at 9:20 on Monday as I have an opening

## 2021-12-31 NOTE — Telephone Encounter (Signed)
Pt is scheduled 12/20 at 10:40. He would like to know what he can do at home until the appointment

## 2021-12-31 NOTE — Telephone Encounter (Signed)
Patient was notified of Dr.Johnson's recommendation. Patient verbalized understanding and has no further questions at this time.

## 2021-12-31 NOTE — Telephone Encounter (Signed)
Elevated his legs

## 2021-12-31 NOTE — Telephone Encounter (Signed)
Looks like I have a 10:40 on Wednesday

## 2022-01-04 ENCOUNTER — Ambulatory Visit: Payer: Self-pay | Admitting: *Deleted

## 2022-01-04 NOTE — Patient Outreach (Signed)
  Care Coordination   Follow Up Visit Note   01/04/2022 Name: Joshua Lee MRN: [id number] DOB: March 13, 1965  Joshua Lee is a 56 y.o. year old male who sees Joshua Carrow, DO for primary care. I spoke with  Joshua Board Marban by phone today.  What matters to the patients health and wellness today?  Right foot swelling/sore.  Also had sore throat, tests (covid, RSV, and flu) were all negative.      Goals Addressed             This Visit's Progress    RNCM: "Having bad pain"   On track    Care Coordination Interventions:  Reviewed provider established plan for pain management. Education and support given. Discussed importance of adherence to all scheduled medical appointments. Will see ortho specialist again, state he is waiting on approval to have 2nd injection for pain control.  Follow up on 12/28 Counseled on the importance of reporting any/all new or changed pain symptoms or management strategies to pain management provider Advised patient to report to care team affect of pain on daily activities Discussed use of relaxation techniques and/or diversional activities to assist with pain reduction (distraction, imagery, relaxation, massage, acupressure, TENS, heat, and cold application. The patient is using cold application Reviewed with patient prescribed pharmacological and nonpharmacological pain relief strategies. The patient is compliant with medications Advised patient to discuss unresolved pain, changes in level or intensity of pain with provider The patient agrees to work with the [facility] in the care coordination program. Has contact information to call the RNCM between outreaches as needed     RNCM: Effective Management of DM   On track    Care Coordination Interventions:  Lab Results  Component Value Date   HGBA1C 6.9 (H) 12/23/2021    Provided education to patient about basic DM disease process Reviewed medications with patient and discussed importance of  medication adherence Counseled on importance of regular laboratory monitoring as prescribed Discussed plans with patient for ongoing care management follow up and provided patient with direct contact information for care management team Reviewed scheduled/upcoming provider appointments including: 12/20 Advised patient, providing education and rationale, to check cbg as directed and record, calling pcp for findings outside established parameters Review of patient status, including review of consultants reports, relevant laboratory and other test results, and medications completed Report his insurance will no longer cover farxiga starting January. He has information that is needed to send to company via provider for authorization, however information couldn't be sent until after 12/15.  Advised to remind provider of paperwork on Wednesday while he is at the office.  Advised to discuss concern for right foot swelling, was seen at urgent care for issues.  State he has been keeping it elevated and put ice on it, unsure if he hit it on something.  Not painful at rest but is sore to touch. Report blood sugars range low 100s.  A1C at goal.        SDOH assessments and interventions completed:  No     Care Coordination Interventions:  Yes, provided   Follow up plan: Follow up call scheduled for 12/16    Encounter Outcome:  Pt. Visit Completed   Kemper Durie, RN, MSN, [facility] Care Management Care Management Coordinator [phone]

## 2022-01-06 ENCOUNTER — Ambulatory Visit
Admission: RE | Admit: 2022-01-06 | Discharge: 2022-01-06 | Disposition: A | Discharge status: Home / Self Care | Payer: 59 | Attending: Family Medicine | Admitting: Family Medicine

## 2022-01-06 ENCOUNTER — Ambulatory Visit
Admission: RE | Admit: 2022-01-06 | Discharge: 2022-01-06 | Disposition: A | Discharge status: Home / Self Care | Payer: 59 | Source: Ambulatory Visit | Attending: Family Medicine | Admitting: Family Medicine

## 2022-01-06 ENCOUNTER — Ambulatory Visit (INDEPENDENT_AMBULATORY_CARE_PROVIDER_SITE_OTHER): Payer: 59 | Admitting: Family Medicine

## 2022-01-06 ENCOUNTER — Encounter: Payer: Self-pay | Admitting: Family Medicine

## 2022-01-06 VITALS — BP 111/70 | HR 84 | Temp 98.0°F | Ht 67.0 in | Wt 243.2 lb

## 2022-01-06 DIAGNOSIS — M5136 Other intervertebral disc degeneration, lumbar region: Secondary | ICD-10-CM | POA: Diagnosis not present

## 2022-01-06 DIAGNOSIS — M79671 Pain in right foot: Secondary | ICD-10-CM | POA: Diagnosis not present

## 2022-01-06 DIAGNOSIS — M9905 Segmental and somatic dysfunction of pelvic region: Secondary | ICD-10-CM | POA: Diagnosis not present

## 2022-01-06 DIAGNOSIS — Z23 Encounter for immunization: Secondary | ICD-10-CM | POA: Diagnosis not present

## 2022-01-06 DIAGNOSIS — J069 Acute upper respiratory infection, unspecified: Secondary | ICD-10-CM | POA: Diagnosis not present

## 2022-01-06 DIAGNOSIS — M7989 Other specified soft tissue disorders: Secondary | ICD-10-CM | POA: Diagnosis not present

## 2022-01-06 DIAGNOSIS — M5416 Radiculopathy, lumbar region: Secondary | ICD-10-CM | POA: Diagnosis not present

## 2022-01-06 DIAGNOSIS — M9903 Segmental and somatic dysfunction of lumbar region: Secondary | ICD-10-CM | POA: Diagnosis not present

## 2022-01-06 MED ORDER — AYR SALINE NASAL NA GEL: 1.0000 | Freq: Four times a day (QID) | NASAL | 6 refills | Status: DC | PRN

## 2022-01-06 MED ORDER — DICLOFENAC SODIUM 1 % EX GEL: 4.0000 g | Freq: Four times a day (QID) | CUTANEOUS | 2 refills | Status: DC

## 2022-01-06 NOTE — Progress Notes (Signed)
BP 111/70   Pulse 84   Temp 98 F (36.7 C) (Oral)   Ht 5\' 7"  (1.702 m)   Wt 243 lb 3.2 oz (110.3 kg)   SpO2 99%   BMI 38.09 kg/m    Subjective:    Patient ID: , male    DOB: 1965-12-04, 56 y.o.   MRN: [id number]  HPI: Joshua Lee is a 56 y.o. male  Chief Complaint  Patient presents with   Foot Swelling    Patient says he had some concerns about his foot swelling, but says he thinks it may be better now.    Cough    Patient says he would like to follow up on his cough he was seen in UC recently. Patient says he is still experiencing the cough and it seems as if the phlegm will not break up.    UPPER RESPIRATORY TRACT INFECTION Duration: about 10 days Worst symptom:  Fever: no Cough: yes Shortness of breath: no Wheezing: no Chest pain: no Chest tightness: no Chest congestion: yes Nasal congestion: yes Runny nose: no Post nasal drip: yes Sneezing: no Sore throat: yes Swollen glands: no Sinus pressure: no Headache: no Face pain: no Toothache: no Ear pain: no  Ear pressure: yes  Eyes red/itching:yes Eye drainage/crusting: no  Vomiting: no Rash: no Fatigue: no Sick contacts: no Strep contacts: no  Context: better Recurrent sinusitis: no Relief with OTC cold/cough medications: no  Treatments attempted: cold/sinus, mucinex, anti-histamine, and cough syrup   FOOT PAIN Duration: about 10 days Involved foot: right Mechanism of injury: unknown Location: top of his foot Onset: sudden  Severity: severe  Quality:  soreness Frequency: intermittent Radiation: no Aggravating factors: nothing  Alleviating factors: nothing  Status: better Treatments attempted: rest, ice, heat, APAP, ibuprofen, and aleve  Relief with NSAIDs?:  moderate Weakness with weight bearing or walking: no Morning stiffness: no Swelling: yes Redness: no Bruising: no Paresthesias / decreased sensation: no  Fevers:no  Relevant past medical, surgical, family and  social history reviewed and updated as indicated. Interim medical history since our last visit reviewed. Allergies and medications reviewed and updated.  Review of Systems  Constitutional: Negative.   HENT:  Positive for congestion, postnasal drip and rhinorrhea. Negative for dental problem, drooling, ear discharge, ear pain, facial swelling, hearing loss, mouth sores, nosebleeds, sinus pressure, sinus pain, sneezing, sore throat, tinnitus, trouble swallowing and voice change.   Respiratory:  Positive for cough and wheezing. Negative for apnea, choking, chest tightness, shortness of breath and stridor.   Cardiovascular: Negative.   Gastrointestinal: Negative.   Musculoskeletal:  Positive for myalgias. Negative for arthralgias, back pain, gait problem, joint swelling, neck pain and neck stiffness.  Skin: Negative.   Psychiatric/Behavioral: Negative.      Per HPI unless specifically indicated above     Objective:    BP 111/70   Pulse 84   Temp 98 F (36.7 C) (Oral)   Ht 5\' 7"  (1.702 m)   Wt 243 lb 3.2 oz (110.3 kg)   SpO2 99%   BMI 38.09 kg/m   Wt Readings from Last 3 Encounters:  01/06/22 243 lb 3.2 oz (110.3 kg)  12/23/21 236 lb 9.6 oz (107.3 kg)  09/23/21 244 lb 8 oz (110.9 kg)    Physical Exam Vitals and nursing note reviewed.  Constitutional:      General: He is not in acute distress.    Appearance: Normal appearance. He is not ill-appearing, toxic-appearing or diaphoretic.  HENT:  Head: Normocephalic and atraumatic.     Right Ear: Tympanic membrane and external ear normal.     Left Ear: Tympanic membrane and external ear normal.     Nose: Nose normal. No congestion or rhinorrhea.     Mouth/Throat:     Mouth: Mucous membranes are moist.     Pharynx: Oropharynx is clear. No oropharyngeal exudate or posterior oropharyngeal erythema.  Eyes:     General: No scleral icterus.       Right eye: No discharge.        Left eye: No discharge.     Extraocular Movements:  Extraocular movements intact.     Conjunctiva/sclera: Conjunctivae normal.     Pupils: Pupils are equal, round, and reactive to light.  Cardiovascular:     Rate and Rhythm: Normal rate and regular rhythm.     Pulses: Normal pulses.     Heart sounds: Normal heart sounds. No murmur heard.    No friction rub. No gallop.  Pulmonary:     Effort: Pulmonary effort is normal. No respiratory distress.     Breath sounds: Normal breath sounds. No stridor. No wheezing, rhonchi or rales.  Chest:     Chest wall: No tenderness.  Musculoskeletal:        General: Normal range of motion.     Cervical back: Normal range of motion and neck supple.  Skin:    General: Skin is warm and dry.     Capillary Refill: Capillary refill takes less than 2 seconds.     Coloration: Skin is not jaundiced or pale.     Findings: No bruising, erythema, lesion or rash.  Neurological:     General: No focal deficit present.     Mental Status: He is alert and oriented to person, place, and time. Mental status is at baseline.  Psychiatric:        Mood and Affect: Mood normal.        Behavior: Behavior normal.        Thought Content: Thought content normal.        Judgment: Judgment normal.     Results for orders placed or performed in visit on 12/23/21  Bayer DCA Hb A1c Waived  Result Value Ref Range   HB A1C (BAYER DCA - WAIVED) 6.9 (H) 4.8 - 5.6 %  Hepatitis C Antibody  Result Value Ref Range   Hep C Virus Ab Non Reactive Non Reactive      Assessment & Plan:   Problem List Items Addressed This Visit   None Visit Diagnoses     Right foot pain    -  Primary   No swelling. Will treat with voltaren. Go for x-ray if not getting better.   Relevant Orders   DG Foot Complete Right   Upper respiratory tract infection, unspecified type       Very well appearing. Will continue symptomatic care. No sign of bacterial infection. Continue to monitor.        Follow up plan: Return as scheduled.

## 2022-01-07 ENCOUNTER — Telehealth: Payer: Self-pay

## 2022-01-07 LAB — HM DIABETES EYE EXAM

## 2022-01-07 NOTE — Progress Notes (Signed)
   Care Guide Note  01/07/2022 Name: Joshua Lee MRN: [id number] DOB: 1965/05/07  Referred by: [name], DO Reason for referral : Care Coordination (Outreach to schedule with Pharm D )   [name] is a 56 y.o. year old male who is a primary care patient of [name], DO. [name] was referred to the pharmacist for assistance related to DM.    Successful contact was made with the patient to discuss pharmacy services including being ready for the pharmacist to call at least 5 minutes before the scheduled appointment time, to have medication bottles and any blood sugar or blood pressure readings ready for review. The patient agreed to meet with the pharmacist via with the pharmacist via telephone visit on (date/time).  02/03/2022  Penne Lash, RMA Care Guide [facility]  [place], [place] [address] Direct Dial: [phone] [email or web address]

## 2022-01-08 ENCOUNTER — Other Ambulatory Visit: Payer: Self-pay | Admitting: Family Medicine

## 2022-01-08 DIAGNOSIS — M79671 Pain in right foot: Secondary | ICD-10-CM

## 2022-01-08 DIAGNOSIS — M19071 Primary osteoarthritis, right ankle and foot: Secondary | ICD-10-CM

## 2022-01-13 ENCOUNTER — Telehealth: Payer: Self-pay | Admitting: Family Medicine

## 2022-01-13 NOTE — Telephone Encounter (Signed)
Pt. Given x-ray results, verbalizes understanding. 

## 2022-01-14 DIAGNOSIS — M5416 Radiculopathy, lumbar region: Secondary | ICD-10-CM | POA: Diagnosis not present

## 2022-01-14 DIAGNOSIS — M791 Myalgia, unspecified site: Secondary | ICD-10-CM | POA: Diagnosis not present

## 2022-01-14 DIAGNOSIS — M5136 Other intervertebral disc degeneration, lumbar region: Secondary | ICD-10-CM | POA: Diagnosis not present

## 2022-01-14 DIAGNOSIS — Z79891 Long term (current) use of opiate analgesic: Secondary | ICD-10-CM | POA: Diagnosis not present

## 2022-01-14 DIAGNOSIS — Z79899 Other long term (current) drug therapy: Secondary | ICD-10-CM | POA: Diagnosis not present

## 2022-01-14 DIAGNOSIS — M48061 Spinal stenosis, lumbar region without neurogenic claudication: Secondary | ICD-10-CM | POA: Diagnosis not present

## 2022-01-14 DIAGNOSIS — M5126 Other intervertebral disc displacement, lumbar region: Secondary | ICD-10-CM | POA: Diagnosis not present

## 2022-01-14 DIAGNOSIS — M545 Low back pain, unspecified: Secondary | ICD-10-CM | POA: Diagnosis not present

## 2022-01-15 ENCOUNTER — Ambulatory Visit: Payer: 59 | Admitting: Podiatry

## 2022-01-15 ENCOUNTER — Ambulatory Visit: Payer: 59 | Admitting: Family Medicine

## 2022-01-15 DIAGNOSIS — M778 Other enthesopathies, not elsewhere classified: Secondary | ICD-10-CM

## 2022-01-15 MED ORDER — BETAMETHASONE SOD PHOS & ACET 6 (3-3) MG/ML IJ SUSP
3.0000 mg | Freq: Once | INTRAMUSCULAR | Status: AC
  Administered 2022-01-15: 3 mg via INTRA_ARTICULAR

## 2022-01-15 NOTE — Progress Notes (Signed)
   Chief Complaint  Patient presents with   Foot Problem    Patient here for arthritis in bilateral feet     HPI: 56 y.o. male presenting today as a reestablish new patient for evaluation of pain and tenderness to the lateral aspect of the right foot.  This has been ongoing for about 2-3 weeks now.  Idiopathic onset.  Denies a history of injury.  Patient is also noted some slight swelling to the foot and ankle.  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   Procedure: TOTAL HIP ARTHROPLASTY ANTERIOR APPROACH;  Surgeon: Kennedy Bucker, MD;  Location: [facility] ORS;  Service: Orthopedics;  Laterality: Right;    Allergies  Allergen Reactions   Penicillins Rash    Has patient had a PCN reaction causing immediate rash, facial/tongue/throat swelling, SOB or lightheadedness with hypotension: unknown Has patient had a PCN reaction causing severe rash involving mucus membranes or skin necrosis: unknown Has patient had a PCN reaction that required hospitalization: unknown Has patient had a PCN reaction occurring within the last 10 years: no If all of the above answers are "NO", then may proceed with Cephalosporin use.      Physical Exam: General: The patient is alert and oriented x3 in no acute distress.  Dermatology: Skin is warm, dry and supple bilateral lower extremities. Negative for open lesions or macerations.  Vascular: Palpable pedal pulses bilaterally. Capillary refill within normal limits.  Negative for any significant edema or erythema  Neurological: Light touch and protective threshold grossly intact  Musculoskeletal Exam:  No pedal deformities noted.  There is some slight tenderness to palpation along the lateral aspect of the right midfoot.  Assessment: 1.  Capsulitis right midfoot lateral aspect -Patient evaluated.  -Injection of 0.5 cc Celestone Soluspan injection the lateral aspect of the right midfoot -Recommend good supportive shoes and sneakers that do not irritate the lateral aspect of the foot -Continue topical diclofenac 1% as needed -Return to clinic as needed     Felecia Shelling, DPM [facility]  Dr. Felecia Shelling, DPM    [address] [place], [place] [address]                Office [phone]  Fax [phone]

## 2022-01-20 ENCOUNTER — Ambulatory Visit: Payer: 59 | Admitting: Family Medicine

## 2022-01-20 ENCOUNTER — Encounter: Payer: Self-pay | Admitting: Family Medicine

## 2022-01-20 VITALS — BP 110/70 | HR 87 | Temp 97.9°F | Ht 67.0 in | Wt 242.1 lb

## 2022-01-20 DIAGNOSIS — M9903 Segmental and somatic dysfunction of lumbar region: Secondary | ICD-10-CM | POA: Diagnosis not present

## 2022-01-20 DIAGNOSIS — M9905 Segmental and somatic dysfunction of pelvic region: Secondary | ICD-10-CM | POA: Diagnosis not present

## 2022-01-20 DIAGNOSIS — I10 Essential (primary) hypertension: Secondary | ICD-10-CM | POA: Diagnosis not present

## 2022-01-20 DIAGNOSIS — M5136 Other intervertebral disc degeneration, lumbar region: Secondary | ICD-10-CM | POA: Diagnosis not present

## 2022-01-20 DIAGNOSIS — M5416 Radiculopathy, lumbar region: Secondary | ICD-10-CM | POA: Diagnosis not present

## 2022-01-20 MED ORDER — AMLODIPINE BESYLATE 2.5 MG PO TABS: 2.5000 mg | ORAL_TABLET | Freq: Every day | ORAL | 1 refills | Status: DC

## 2022-01-20 NOTE — Progress Notes (Signed)
BP 110/70   Pulse 87   Temp 97.9 F (36.6 C) (Oral)   Ht 5\' 7"  (1.702 m)   Wt 242 lb 1.6 oz (109.8 kg)   SpO2 98%   BMI 37.92 kg/m    Subjective:    Patient ID: Joshua Lee, male    DOB: [birth year], 57 y.o.   MRN: [id number]  HPI: Joshua Lee is a 57 y.o. male  Chief Complaint  Patient presents with   Hypertension   HYPERTENSION  Hypertension status: better  Satisfied with current treatment? no Duration of hypertension: chronic BP monitoring frequency:  not checking BP medication side effects:  no Medication compliance: excellent compliance Previous BP meds: amlodipine, benazepril, metoprolol Aspirin: no Recurrent headaches: no Visual changes: no Palpitations: no Dyspnea: no Chest pain: no Lower extremity edema: no Dizzy/lightheaded: no   Relevant past medical, surgical, family and social history reviewed and updated as indicated. Interim medical history since our last visit reviewed. Allergies and medications reviewed and updated.  Review of Systems  Constitutional: Negative.   Respiratory: Negative.    Cardiovascular: Negative.   Gastrointestinal: Negative.   Musculoskeletal: Negative.   Neurological: Negative.   Psychiatric/Behavioral: Negative.      Per HPI unless specifically indicated above     Objective:    BP 110/70   Pulse 87   Temp 97.9 F (36.6 C) (Oral)   Ht 5\' 7"  (1.702 m)   Wt 242 lb 1.6 oz (109.8 kg)   SpO2 98%   BMI 37.92 kg/m   Wt Readings from Last 3 Encounters:  01/20/22 242 lb 1.6 oz (109.8 kg)  01/06/22 243 lb 3.2 oz (110.3 kg)  12/23/21 236 lb 9.6 oz (107.3 kg)    Physical Exam Vitals and nursing note reviewed.  Constitutional:      General: He is not in acute distress.    Appearance: Normal appearance. He is obese. He is not ill-appearing, toxic-appearing or diaphoretic.  HENT:     Head: Normocephalic and atraumatic.     Right Ear: External ear normal.     Left Ear: External ear normal.     Nose: Nose  normal.     Mouth/Throat:     Mouth: Mucous membranes are moist.     Pharynx: Oropharynx is clear.  Eyes:     General: No scleral icterus.       Right eye: No discharge.        Left eye: No discharge.     Extraocular Movements: Extraocular movements intact.     Conjunctiva/sclera: Conjunctivae normal.     Pupils: Pupils are equal, round, and reactive to light.  Cardiovascular:     Rate and Rhythm: Normal rate and regular rhythm.     Pulses: Normal pulses.     Heart sounds: Normal heart sounds. No murmur heard.    No friction rub. No gallop.  Pulmonary:     Effort: Pulmonary effort is normal. No respiratory distress.     Breath sounds: Normal breath sounds. No stridor. No wheezing, rhonchi or rales.  Chest:     Chest wall: No tenderness.  Musculoskeletal:        General: Normal range of motion.     Cervical back: Normal range of motion and neck supple.  Skin:    General: Skin is warm and dry.     Capillary Refill: Capillary refill takes less than 2 seconds.     Coloration: Skin is not jaundiced or pale.  Findings: No bruising, erythema, lesion or rash.  Neurological:     General: No focal deficit present.     Mental Status: He is alert and oriented to person, place, and time. Mental status is at baseline.  Psychiatric:        Mood and Affect: Mood normal.        Behavior: Behavior normal.        Thought Content: Thought content normal.        Judgment: Judgment normal.     Results for orders placed or performed in visit on 01/08/22  HM DIABETES EYE EXAM  Result Value Ref Range   HM Diabetic Eye Exam No Retinopathy No Retinopathy      Assessment & Plan:   Problem List Items Addressed This Visit       Cardiovascular and Mediastinum   Hypertension - Primary    Under good control on current regimen. Continue current regimen. Continue to monitor. Call with any concerns. Refills given.        Relevant Medications   amLODipine (NORVASC) 2.5 MG tablet     Follow  up plan: Return in about 2 months (around 03/21/2022).

## 2022-01-20 NOTE — Assessment & Plan Note (Signed)
Under good control on current regimen. Continue current regimen. Continue to monitor. Call with any concerns. Refills given.   

## 2022-01-25 ENCOUNTER — Other Ambulatory Visit: Payer: Self-pay | Admitting: Family Medicine

## 2022-01-25 ENCOUNTER — Other Ambulatory Visit: Payer: Self-pay

## 2022-01-25 NOTE — Telephone Encounter (Signed)
Prior authorization for prescription of Wilder Glade was denied via CoverMyMeds. Patient was notified and would like to know the next recommended steps. Please advise?

## 2022-01-29 ENCOUNTER — Telehealth: Payer: Self-pay

## 2022-01-29 MED ORDER — EMPAGLIFLOZIN 25 MG PO TABS: 25.0000 mg | ORAL_TABLET | Freq: Every day | ORAL | 1 refills | Status: DC

## 2022-01-29 NOTE — Telephone Encounter (Signed)
Requested Prescriptions  Pending Prescriptions Disp Refills   empagliflozin (JARDIANCE) 25 MG TABS tablet 90 tablet 1    Sig: Take 1 tablet (25 mg total) by mouth daily before breakfast.     Endocrinology:  Diabetes - SGLT2 Inhibitors Passed - 01/29/2022  2:54 PM      Passed - Cr in normal range and within 360 days    Creatinine, Ser  Date Value Ref Range Status  09/23/2021 0.81 0.76 - 1.27 mg/dL Final         Passed - HBA1C is between 0 and 7.9 and within 180 days    Hemoglobin A1C  Date Value Ref Range Status  09/04/2015 6.9  Final  09/04/2015 6.9  Final   HB A1C (BAYER DCA - WAIVED)  Date Value Ref Range Status  12/23/2021 6.9 (H) 4.8 - 5.6 % Final    Comment:             Prediabetes: 5.7 - 6.4          Diabetes: >6.4          Glycemic control for adults with diabetes: <7.0          Passed - eGFR in normal range and within 360 days    GFR calc Af Amer  Date Value Ref Range Status  02/28/2020 114 >59 mL/min/1.73 Final    Comment:    **In accordance with recommendations from the NKF-ASN Task force,**   [facility] is in the process of updating its eGFR calculation to the   2021 CKD-EPI creatinine equation that estimates kidney function   without a race variable.    GFR calc non Af Amer  Date Value Ref Range Status  02/28/2020 98 >59 mL/min/1.73 Final   eGFR  Date Value Ref Range Status  09/23/2021 104 >59 mL/min/1.73 Final         Passed - Valid encounter within last 6 months    Recent Outpatient Visits           1 week ago Primary hypertension   [place], [name], DO   3 weeks ago Right foot pain   [place], [name], DO   1 month ago Type 2 diabetes mellitus with stage 3 chronic kidney disease, without long-term current use of insulin, unspecified whether stage 3a or 3b CKD ([place])   [place], [name], DO   4 months ago Routine general medical examination at a health care facility   [facility], [place], DO   11 months ago Primary hypertension   [facility] [place], [place], DO       Future Appointments             In 2 months Johnson, [name], DO [employer]            Signed Prescriptions Disp Refills   empagliflozin (JARDIANCE) 25 MG TABS tablet 90 tablet 1    Sig: Take 1 tablet (25 mg total) by mouth daily before breakfast.     There is no refill protocol information for this order

## 2022-01-29 NOTE — Addendum Note (Signed)
Addended by: Valli Glance F on: 01/29/2022 02:54 PM   Modules accepted: Orders

## 2022-01-29 NOTE — Telephone Encounter (Signed)
Joshua Lee was already sent to his pharmacy. He has an appointment to speak to pharmacist on Wednesday

## 2022-01-29 NOTE — Telephone Encounter (Signed)
He also wants to have his jardiance sent to [facility] on [address]

## 2022-01-29 NOTE — Telephone Encounter (Signed)
Patient called back wanting to know why his Wilder Glade was denied, please advise. Has questions for clinic.

## 2022-01-29 NOTE — Telephone Encounter (Signed)
Patient is upset that his Wilder Glade was denied, and that he can't afford.

## 2022-02-01 DIAGNOSIS — N200 Calculus of kidney: Secondary | ICD-10-CM | POA: Diagnosis not present

## 2022-02-02 ENCOUNTER — Ambulatory Visit: Payer: Self-pay | Admitting: *Deleted

## 2022-02-02 NOTE — Patient Outreach (Signed)
  Care Coordination   Follow Up Visit Note   02/02/2022 Name: Joshua Lee MRN: [id number] DOB: [birth year]  Joshua Lee is a 57 y.o. year old male who sees [name], DO for primary care. I spoke with  Joshua Lee by phone today.  What matters to the patients health and wellness today?  Ongoing knee pain, will call specialist to follow up on injection.      Goals Addressed             This Visit's Progress    RNCM: "Having bad pain"   On track    Care Coordination Interventions:  Reviewed provider established plan for pain management. Education and support given. Discussed importance of adherence to all scheduled medical appointments. Will see ortho specialist again, state he is waiting on approval to have 2nd injection for pain control.   Counseled on the importance of reporting any/all new or changed pain symptoms or management strategies to pain management provider Advised patient to report to care team affect of pain on daily activities Discussed use of relaxation techniques and/or diversional activities to assist with pain reduction (distraction, imagery, relaxation, massage, acupressure, TENS, heat, and cold application. The patient is using cold application Reviewed with patient prescribed pharmacological and nonpharmacological pain relief strategies. The patient is compliant with medications Advised patient to discuss unresolved pain, changes in level or intensity of pain with provider The patient agrees to work with the [facility] in the care coordination program. Has contact information to call the RNCM between outreaches as needed     RNCM: Effective Management of DM   On track    Care Coordination Interventions:  Lab Results  Component Value Date   HGBA1C 6.9 (H) 12/23/2021    Provided education to patient about basic DM disease process Reviewed medications with patient and discussed importance of medication adherence Counseled on importance of regular  laboratory monitoring as prescribed Discussed plans with patient for ongoing care management follow up and provided patient with direct contact information for care management team Reviewed scheduled/upcoming provider appointments including: tomorrow with pharmacy team and 4/4 with PCP Advised patient, providing education and rationale, to check cbg as directed and record, calling pcp for findings outside established parameters Review of patient status, including review of consultants reports, relevant laboratory and other test results, and medications completed Report he will discuss further with pharmacy team cost of Jardiance and Trulicity Admits that he has not been checking his blood sugar as his A1C has been at goal.  Encouraged to continue to monitor        SDOH assessments and interventions completed:  No     Care Coordination Interventions:  Yes, provided   Follow up plan: Follow up call scheduled for 3/29    Encounter Outcome:  Pt. Visit Completed   Valente David, RN, MSN, [place] [facility] Care Management Coordinator [phone]

## 2022-02-03 ENCOUNTER — Other Ambulatory Visit: Payer: 59

## 2022-02-03 ENCOUNTER — Other Ambulatory Visit: Payer: 59 | Admitting: Pharmacist

## 2022-02-03 DIAGNOSIS — E119 Type 2 diabetes mellitus without complications: Secondary | ICD-10-CM | POA: Diagnosis not present

## 2022-02-03 DIAGNOSIS — M5416 Radiculopathy, lumbar region: Secondary | ICD-10-CM | POA: Diagnosis not present

## 2022-02-03 NOTE — Progress Notes (Signed)
02/03/2022 Name: Joshua Lee MRN: [id number] DOB: 03-03-1965  Chief Complaint  Patient presents with   Medication Problem    Needs Jardiance assistance    KEIGAN TAFOYA is a 57 y.o. year old male who presented for a telephone visit.   They were referred to the pharmacist by their PCP for assistance in managing medication access.   Patient is participating in a Managed Medicaid Plan:  No  Subjective: Referred to pharmacy to assist with medication affordability.  Patient previously on Farxiga, which is no longer covered by his insurance.  Switched to [place], but the copay is $140.    Care Team: Primary Care Provider: [name], DO   Medication Access/Adherence  Current Pharmacy:  [facility] [place], [place] [address] Phone: [phone] Fax: [phone]  [id number], [place] [address] [place] [address] Phone: [phone] Fax: [phone]  [id number] [place], [place] [address] Phone: [phone] Fax: [phone]   Patient reports affordability concerns with their medications: Yes  Patient reports access/transportation concerns to their pharmacy: No  Patient reports adherence concerns with their medications:  No  Has full bottle of Wilder Glade he is taking until Jardiance available/afforable  Objective: Patient reported FBG: 1/11:  94 1/12:  121 1/13:  109 1/14:  130  Lab Results  Component Value Date   HGBA1C 6.9 (H) 12/23/2021   Medications Reviewed Today     Reviewed by Darlina Guys, [place] (Pharmacist) on 02/03/22 at 1143  Med List Status: <None>   Medication Order Taking? Sig Documenting Provider Last Dose Status Informant  acetaminophen (TYLENOL) 650 MG CR tablet [id number]  Take 650-1,950 mg by mouth every 8 (eight) hours as needed  for pain. [provider]  Active Self  amLODipine (NORVASC) 2.5 MG tablet [id number] Yes Take 1 tablet (2.5 mg total) by mouth daily. [place] P, DO  Active   aspirin 81 MG tablet [id number] Yes Take 81 mg by mouth daily. [provider]  Active Self  atorvastatin (LIPITOR) 80 MG tablet [id number] Yes Take 1 tablet (80 mg total) by mouth at bedtime. Johnson, [name], DO  Active   benazepril (LOTENSIN) 40 MG tablet [id number] Yes Take 1 tablet (40 mg total) by mouth daily. Johnson, [name], DO  Active   Coenzyme Q-10 100 MG capsule [id number]  Take 100 mg by mouth daily. [provider]  Active Self  CONTOUR NEXT TEST test strip [id number] Yes USE AS DIRECTED Wynetta Emery, [name], DO  Active   cyclobenzaprine (FLEXERIL) 10 MG tablet [id number] Yes Take 1 tablet (10 mg total) by mouth at bedtime. Wynetta Emery, [name], DO  Active   diclofenac Sodium (VOLTAREN) 1 % GEL [id number] Yes Apply 4 g topically 4 (four) times daily. Johnson, [name], DO  Active   Dulaglutide (TRULICITY) 3 RX/5.4MG SOPN [id number] Yes Inject 3 mg as directed once a week. Johnson, [name], DO  Active   empagliflozin (JARDIANCE) 25 MG TABS tablet [id number]  Take 1 tablet (25 mg total) by mouth daily before breakfast. [name], DO  Active            Med Note Colin Rhein, Ell Tiso A   Wed Feb 03, 2022 11:36 AM) Waiting on coupon card to be able to p/u  fluticasone (  FLONASE) 50 MCG/ACT nasal spray [id number] Yes Place 1 spray into both nostrils 2 (two) times daily. [provider]  Active            Med Note Colin Rhein, Aidden Markovic A   Wed Feb 03, 2022 11:37 AM) Using as needed  gabapentin (NEURONTIN) 300 MG capsule [id number] Yes Take 300 mg by mouth 3 (three) times daily as needed. [provider]  Active   Garlic 4742 MG CAPS [id number] Yes Take 1,000 mg by mouth daily. [provider]  Active Self  icosapent Ethyl (VASCEPA) 1 g capsule [id number] Yes Take 2 capsules (2 g total) by mouth 2 (two)  times daily. Wynetta Emery, [name], DO  Active   metFORMIN (GLUCOPHAGE) 500 MG tablet [id number] Yes TAKE 2 TABLETS(1000 MG) BY MOUTH TWICE DAILY WITH A MEAL Johnson, [name], DO  Active   metoprolol tartrate (LOPRESSOR) 100 MG tablet [id number] Yes Take 1 tablet (100 mg total) by mouth 2 (two) times daily. Johnson, [name], DO  Active   Potassium Citrate 15 MEQ (1620 MG) TBCR [id number]  Take 15 mEq by mouth 2 (two) times daily. 2 tab QAM, 1 tab QPM [provider]  Active Self           Med Note Colin Rhein, Mylo Choi A   Wed Feb 03, 2022 11:41 AM) Patient states taking 2BID  saline (AYR) GEL [id number] Yes Place 1 Application into both nostrils every 6 (six) hours as needed. Johnson, [name], DO  Active   tamsulosin (FLOMAX) 0.4 MG CAPS capsule [id number] Yes TAKE 1 CAPSULE(0.4 MG) BY MOUTH DAILY [name], DO  Active     Discontinued 02/03/22 1142 (Completed Course)   traMADol (ULTRAM) 50 MG tablet [id number] Yes Take 50 mg by mouth every 12 (twelve) hours as needed. [provider]  Active             Assessment/Plan:  -Wilder Glade no longer covered by insurance; Dr. Wynetta Emery sent in prescription for Jardiance, but $140 copay -Obtained coupon for [facility] patient, [facility], and got copay at $10 (coupon good until 01/18/23) -Patient also stated Trulicity was $94 but only for half of the year last year, then went up to >$100 -[facility]ified [facility] coupon [facility], and prescription will be $25 moving forward until coupon expires (at that point patient can renew), or insurance formulary changes  Follow Up Plan: Calling patient back at 4pm (he has afternoon appointment with another provider) to inform him of Jardiance copay and availability to pick up; will also inform of expected Trulicity copay for remainder of the year.  Thank you for the involving me in the patient's care; please do not hesitate to reach out with any future needs.

## 2022-02-11 ENCOUNTER — Other Ambulatory Visit: Payer: Self-pay | Admitting: Family Medicine

## 2022-02-11 NOTE — Addendum Note (Signed)
Addended by: Matilde Sprang on: 02/11/2022 01:40 PM   Modules accepted: Orders

## 2022-02-11 NOTE — Telephone Encounter (Signed)
Requested medication (s) are due for refill today: Amount not specified  Requested medication (s) are on the active medication list: yes    Last refill: 03/02/21  Future visit scheduled yes 04/22/22  Notes to clinic:Historical provider, please review. Thank you.  Requested Prescriptions  Pending Prescriptions Disp Refills   fluticasone (FLONASE) 50 MCG/ACT nasal spray      Sig: Place 1 spray into both nostrils 2 (two) times daily.     Ear, Nose, and Throat: Nasal Preparations - Corticosteroids Passed - 02/11/2022  1:40 PM      Passed - Valid encounter within last 12 months    Recent Outpatient Visits           3 weeks ago Primary hypertension   [place], [name], DO   1 month ago Right foot pain   [facility] [place], [name], DO   1 month ago Type 2 diabetes mellitus with stage 3 chronic kidney disease, without long-term current use of insulin, unspecified whether stage 3a or 3b CKD ([place])   [place], [name], DO   4 months ago Routine general medical examination at a health care facility   [facility], [place], DO   11 months ago Primary hypertension   [place], Barb Merino, DO       Future Appointments             In 2 months Wynetta Emery, Barb Merino, DO [place], [facility]

## 2022-02-11 NOTE — Telephone Encounter (Signed)
Medication Refill - Medication: fluticasone (FLONASE) 50 MCG/ACT nasal spray   Has the patient contacted their pharmacy? Yes.   (Agent: If no, request that the patient contact the pharmacy for the refill. If patient does not wish to contact the pharmacy document the reason why and proceed with request.) (Agent: If yes, when and what did the pharmacy advise?)  Preferred Pharmacy (with phone number or street name):  [id number] [place] [address]  [address] [place] [address]  Phone: [phone] Fax: [phone]   Has the patient been seen for an appointment in the last year OR does the patient have an upcoming appointment? Yes.    Agent: Please be advised that RX refills may take up to 3 business days. We ask that you follow-up with your pharmacy.

## 2022-02-12 ENCOUNTER — Other Ambulatory Visit: Payer: Self-pay | Admitting: Family Medicine

## 2022-02-12 NOTE — Telephone Encounter (Signed)
Requested medication (s) are due for refill today: yes  Requested medication (s) are on the active medication list: historical med  Last refill:  03/02/20  Future visit scheduled: yes  Notes to clinic:  historical provider   Requested Prescriptions  Pending Prescriptions Disp Refills   fluticasone (FLONASE) 50 MCG/ACT nasal spray      Sig: Place 1 spray into both nostrils 2 (two) times daily.     Ear, Nose, and Throat: Nasal Preparations - Corticosteroids Passed - 02/12/2022  2:33 PM      Passed - Valid encounter within last 12 months    Recent Outpatient Visits           3 weeks ago Primary hypertension   [place], [place] P, DO   1 month ago Right foot pain   [facility] [place], [name], DO   1 month ago Type 2 diabetes mellitus with stage 3 chronic kidney disease, without long-term current use of insulin, unspecified whether stage 3a or 3b CKD ([place])   [place], [name], DO   4 months ago Routine general medical examination at a health care facility   [facility], [place], DO   11 months ago Primary hypertension   [place], Barb Merino, DO       Future Appointments             In 2 months Wynetta Emery, Barb Merino, DO [place], [facility]

## 2022-02-12 NOTE — Telephone Encounter (Signed)
Patient called and stated that he was out of  his nasal pray luticasone (FLONASE) 50 MCG/ACT nasal spray [[id number]]  and needs a refill please advise.

## 2022-02-12 NOTE — Telephone Encounter (Signed)
Pt is calling to check on the status of the medication refill request. Advised that medication refills can take up to 3 business days.

## 2022-02-14 MED ORDER — FLUTICASONE PROPIONATE 50 MCG/ACT NA SUSP: 1.0000 | Freq: Two times a day (BID) | NASAL | 12 refills | Status: DC

## 2022-02-15 NOTE — Telephone Encounter (Signed)
This has already been refilled

## 2022-02-17 DIAGNOSIS — M9903 Segmental and somatic dysfunction of lumbar region: Secondary | ICD-10-CM | POA: Diagnosis not present

## 2022-02-17 DIAGNOSIS — M5136 Other intervertebral disc degeneration, lumbar region: Secondary | ICD-10-CM | POA: Diagnosis not present

## 2022-02-17 DIAGNOSIS — M9905 Segmental and somatic dysfunction of pelvic region: Secondary | ICD-10-CM | POA: Diagnosis not present

## 2022-02-17 DIAGNOSIS — M5416 Radiculopathy, lumbar region: Secondary | ICD-10-CM | POA: Diagnosis not present

## 2022-03-02 ENCOUNTER — Other Ambulatory Visit: Payer: Self-pay | Admitting: Family Medicine

## 2022-03-02 DIAGNOSIS — I10 Essential (primary) hypertension: Secondary | ICD-10-CM

## 2022-03-03 NOTE — Telephone Encounter (Signed)
Requested Prescriptions  Pending Prescriptions Disp Refills   benazepril (LOTENSIN) 40 MG tablet [Pharmacy Med Name: BENAZEPRIL HCL 40 MG TABLET] 90 tablet 0    Sig: TAKE 1 TABLET BY MOUTH EVERY DAY     Cardiovascular:  ACE Inhibitors Passed - 03/02/2022  1:31 PM      Passed - Cr in normal range and within 180 days    Creatinine, Ser  Date Value Ref Range Status  09/23/2021 0.81 0.76 - 1.27 mg/dL Final         Passed - K in normal range and within 180 days    Potassium  Date Value Ref Range Status  09/23/2021 4.1 3.5 - 5.2 mmol/L Final         Passed - Patient is not pregnant      Passed - Last BP in normal range    BP Readings from Last 1 Encounters:  01/20/22 110/70         Passed - Valid encounter within last 6 months    Recent Outpatient Visits           1 month ago Primary hypertension   [place], [place] P, DO   1 month ago Right foot pain   [place] [facility] [place], [name], DO   2 months ago Type 2 diabetes mellitus with stage 3 chronic kidney disease, without long-term current use of insulin, unspecified whether stage 3a or 3b CKD ([place])   [place], [name], DO   5 months ago Routine general medical examination at a health care facility   [place], [place], DO   1 year ago Primary hypertension   [place], [place], DO       Future Appointments             In 1 month Johnson, Barb Merino, DO [place], [facility]             tamsulosin (FLOMAX) 0.4 MG CAPS capsule [Pharmacy Med Name: TAMSULOSIN HCL 0.4 MG CAPSULE] 90 capsule 1    Sig: TAKE 1 CAPSULE (0.4 MG) BY MOUTH DAILY     Urology: Alpha-Adrenergic Blocker Passed - 03/02/2022  1:31 PM      Passed - PSA in normal range and within 360 days    Prostate Specific Ag, Serum  Date Value Ref Range Status  09/23/2021 0.7 0.0 - 4.0  ng/mL Final    Comment:    Roche ECLIA methodology. According to the American Urological Association, Serum PSA should decrease and remain at undetectable levels after radical prostatectomy. The AUA defines biochemical recurrence as an initial PSA value 0.2 ng/mL or greater followed by a subsequent confirmatory PSA value 0.2 ng/mL or greater. Values obtained with different assay methods or kits cannot be used interchangeably. Results cannot be interpreted as absolute evidence of the presence or absence of malignant disease.          Passed - Last BP in normal range    BP Readings from Last 1 Encounters:  01/20/22 110/70         Passed - Valid encounter within last 12 months    Recent Outpatient Visits           1 month ago Primary hypertension   [place], [name], DO   1 month ago Right foot pain   [place], [name], DO   2 months ago Type 2 diabetes mellitus with stage 3 chronic kidney disease, without long-term current use of insulin, unspecified whether stage 3a or 3b CKD ([place])   [place], [name], DO   5 months ago Routine general medical examination at a health care facility   [place], [place], DO   1 year ago Primary hypertension   [place], [place], DO       Future Appointments             In 1 month Wynetta Emery, Barb Merino, DO [place], [facility]

## 2022-03-17 DIAGNOSIS — M9903 Segmental and somatic dysfunction of lumbar region: Secondary | ICD-10-CM | POA: Diagnosis not present

## 2022-03-17 DIAGNOSIS — M5136 Other intervertebral disc degeneration, lumbar region: Secondary | ICD-10-CM | POA: Diagnosis not present

## 2022-03-17 DIAGNOSIS — M9905 Segmental and somatic dysfunction of pelvic region: Secondary | ICD-10-CM | POA: Diagnosis not present

## 2022-03-17 DIAGNOSIS — M5416 Radiculopathy, lumbar region: Secondary | ICD-10-CM | POA: Diagnosis not present

## 2022-03-24 ENCOUNTER — Other Ambulatory Visit: Payer: Self-pay | Admitting: Family Medicine

## 2022-03-24 NOTE — Telephone Encounter (Signed)
Requested Prescriptions  Pending Prescriptions Disp Refills   TRULICITY 3 0000000 SOPN [Pharmacy Med Name: TRULICITY 3 [id number] ML PEN] 6 mL 0    Sig: INJECT 3 MG AS DIRECTED ONCE A WEEK.     Endocrinology:  Diabetes - GLP-1 Receptor Agonists Passed - 03/24/2022 11:15 AM      Passed - HBA1C is between 0 and 7.9 and within 180 days    Hemoglobin A1C  Date Value Ref Range Status  09/04/2015 6.9  Final  09/04/2015 6.9  Final   HB A1C (BAYER DCA - WAIVED)  Date Value Ref Range Status  12/23/2021 6.9 (H) 4.8 - 5.6 % Final    Comment:             Prediabetes: 5.7 - 6.4          Diabetes: >6.4          Glycemic control for adults with diabetes: <7.0          Passed - Valid encounter within last 6 months    Recent Outpatient Visits           2 months ago Primary hypertension   [place], [place] P, DO   2 months ago Right foot pain   [place] [place], [name], DO   3 months ago Type 2 diabetes mellitus with stage 3 chronic kidney disease, without long-term current use of insulin, unspecified whether stage 3a or 3b CKD ([place])   [place], [name], DO   6 months ago Routine general medical examination at a health care facility   [place], [place], DO   1 year ago Primary hypertension   [place], Barb Merino, DO       Future Appointments             In 1 week Dionisio David, MD [place]   In 4 weeks Wynetta Emery, Barb Merino, DO [place], [facility]

## 2022-04-01 ENCOUNTER — Ambulatory Visit: Payer: 59 | Admitting: Cardiovascular Disease

## 2022-04-01 ENCOUNTER — Encounter: Payer: Self-pay | Admitting: Cardiovascular Disease

## 2022-04-01 VITALS — BP 110/70 | HR 87 | Ht 69.0 in | Wt 244.0 lb

## 2022-04-01 DIAGNOSIS — I152 Hypertension secondary to endocrine disorders: Secondary | ICD-10-CM

## 2022-04-01 DIAGNOSIS — E1159 Type 2 diabetes mellitus with other circulatory complications: Secondary | ICD-10-CM | POA: Diagnosis not present

## 2022-04-01 DIAGNOSIS — E669 Obesity, unspecified: Secondary | ICD-10-CM

## 2022-04-01 DIAGNOSIS — N183 Chronic kidney disease, stage 3 unspecified: Secondary | ICD-10-CM | POA: Diagnosis not present

## 2022-04-01 DIAGNOSIS — E1169 Type 2 diabetes mellitus with other specified complication: Secondary | ICD-10-CM

## 2022-04-01 DIAGNOSIS — E782 Mixed hyperlipidemia: Secondary | ICD-10-CM

## 2022-04-01 DIAGNOSIS — N182 Chronic kidney disease, stage 2 (mild): Secondary | ICD-10-CM | POA: Diagnosis not present

## 2022-04-01 DIAGNOSIS — E1122 Type 2 diabetes mellitus with diabetic chronic kidney disease: Secondary | ICD-10-CM

## 2022-04-01 DIAGNOSIS — I10 Essential (primary) hypertension: Secondary | ICD-10-CM

## 2022-04-01 NOTE — Assessment & Plan Note (Signed)
Well controlled 

## 2022-04-01 NOTE — Assessment & Plan Note (Signed)
Triglyceride high along with low HDL, already on vasepa, but crestor may be bettrer than lipitor. But he doesnot want to change. Advise exerise.

## 2022-04-01 NOTE — Progress Notes (Signed)
Cardiology Office Note   Date:  04/01/2022   ID:  JARMEL KMETZ, DOB 07/22/1965, MRN [id number]  PCP:  [name], DO  Cardiologist:  Neoma Laming, MD      History of Present Illness: Joshua Lee is a 57 y.o. male who presents for  Chief Complaint  Patient presents with   Follow-up    4 month follow up    HPI    Past Medical History:  Diagnosis Date   Diabetes mellitus without complication ([place])    GERD (gastroesophageal reflux disease)    Hyperlipidemia    Hypertension    Kidney stones    Plantar fascial fibromatosis      Past Surgical History:  Procedure Laterality Date   CHOLECYSTECTOMY  2006   GALLBLADDER SURGERY     KIDNEY STONE SURGERY     KNEE ARTHROSCOPY     TOTAL HIP ARTHROPLASTY Right 07/08/2015   Procedure: TOTAL HIP ARTHROPLASTY ANTERIOR APPROACH;  Surgeon: Hessie Knows, MD;  Location: [facility] ORS;  Service: Orthopedics;  Laterality: Right;     Current Outpatient Medications  Medication Sig Dispense Refill   acetaminophen (TYLENOL) 650 MG CR tablet Take 650-1,950 mg by mouth every 8 (eight) hours as needed for pain.     amLODipine (NORVASC) 2.5 MG tablet Take 1 tablet (2.5 mg total) by mouth daily. 90 tablet 1   aspirin 81 MG tablet Take 81 mg by mouth daily.     atorvastatin (LIPITOR) 80 MG tablet Take 1 tablet (80 mg total) by mouth at bedtime. 90 tablet 1   benazepril (LOTENSIN) 40 MG tablet TAKE 1 TABLET BY MOUTH EVERY DAY 90 tablet 0   Coenzyme Q-10 100 MG capsule Take 100 mg by mouth daily.     CONTOUR NEXT TEST test strip USE AS DIRECTED 100 strip 0   cyclobenzaprine (FLEXERIL) 10 MG tablet Take 1 tablet (10 mg total) by mouth at bedtime. 30 tablet 1   diclofenac Sodium (VOLTAREN) 1 % GEL Apply 4 g topically 4 (four) times daily. 350 g 2   Dulaglutide (TRULICITY) 3 0000000 SOPN INJECT 3 MG AS DIRECTED ONCE A WEEK. 6 mL 0   empagliflozin (JARDIANCE) 25 MG TABS tablet Take 1 tablet (25 mg total) by mouth daily before breakfast.  90 tablet 1   fluticasone (FLONASE) 50 MCG/ACT nasal spray Place 1 spray into both nostrils 2 (two) times daily. 16 g 12   gabapentin (NEURONTIN) 300 MG capsule Take 300 mg by mouth 3 (three) times daily as needed.     Garlic 123XX123 MG CAPS Take 1,000 mg by mouth daily.     icosapent Ethyl (VASCEPA) 1 g capsule Take 2 capsules (2 g total) by mouth 2 (two) times daily. 180 capsule 1   metFORMIN (GLUCOPHAGE) 500 MG tablet TAKE 2 TABLETS(1000 MG) BY MOUTH TWICE DAILY WITH A MEAL 360 tablet 1   metoprolol tartrate (LOPRESSOR) 100 MG tablet Take 1 tablet (100 mg total) by mouth 2 (two) times daily. 180 tablet 1   Potassium Citrate 15 MEQ (1620 MG) TBCR Take 15 mEq by mouth 2 (two) times daily. 2 tab QAM, 1 tab QPM  1   saline (AYR) GEL Place 1 Application into both nostrils every 6 (six) hours as needed. 14.1 g 6   tamsulosin (FLOMAX) 0.4 MG CAPS capsule TAKE 1 CAPSULE (0.4 MG) BY MOUTH DAILY 90 capsule 1   traMADol (ULTRAM) 50 MG tablet Take 50 mg by mouth every 12 (twelve) hours as  needed.     No current facility-administered medications for this visit.    Allergies:   Penicillins    Social History:   reports that he has never smoked. His smokeless tobacco use includes snuff. He reports that he does not currently use alcohol. He reports that he does not use drugs.   Family History:  family history includes Diabetes in his maternal uncle.    ROS:     Review of Systems  Constitutional: Negative.   HENT: Negative.    Eyes: Negative.   Respiratory: Negative.    Gastrointestinal: Negative.   Genitourinary: Negative.   Musculoskeletal: Negative.   Skin: Negative.   Neurological: Negative.   Endo/Heme/Allergies: Negative.   Psychiatric/Behavioral: Negative.    All other systems reviewed and are negative.     All other systems are reviewed and negative.    PHYSICAL EXAM: VS:  BP 110/70   Pulse 87   Ht '5\' 9"'$  (1.753 m)   Wt 244 lb (110.7 kg)   SpO2 97%   BMI 36.03 kg/m  , BMI Body  mass index is 36.03 kg/m. Last weight:  Wt Readings from Last 3 Encounters:  04/01/22 244 lb (110.7 kg)  01/20/22 242 lb 1.6 oz (109.8 kg)  01/06/22 243 lb 3.2 oz (110.3 kg)     Physical Exam Vitals reviewed.  Constitutional:      Appearance: Normal appearance. He is normal weight.  HENT:     Head: Normocephalic.     Nose: Nose normal.     Mouth/Throat:     Mouth: Mucous membranes are moist.  Eyes:     Pupils: Pupils are equal, round, and reactive to light.  Cardiovascular:     Rate and Rhythm: Normal rate and regular rhythm.     Pulses: Normal pulses.     Heart sounds: Normal heart sounds.  Pulmonary:     Effort: Pulmonary effort is normal.  Abdominal:     General: Abdomen is flat. Bowel sounds are normal.  Musculoskeletal:        General: Normal range of motion.     Cervical back: Normal range of motion.  Skin:    General: Skin is warm.  Neurological:     General: No focal deficit present.     Mental Status: He is alert.  Psychiatric:        Mood and Affect: Mood normal.       EKG:   Recent Labs: 09/23/2021: ALT 24; BUN 10; Creatinine, Ser 0.81; Hemoglobin 14.2; Platelets 201; Potassium 4.1; Sodium 138; TSH 3.020    Lipid Panel    Component Value Date/Time   CHOL 104 09/23/2021 0927   CHOL 108 08/29/2018 1045   TRIG 167 (H) 09/23/2021 0927   TRIG 221 (H) 08/29/2018 1045   HDL 30 (L) 09/23/2021 0927   CHOLHDL 6.5 (H) 02/09/2017 1002   VLDL 44 (H) 08/29/2018 1045   LDLCALC 46 09/23/2021 0927      REASON FOR VISIT  Referred by Dr.Alejandria Wessells Humphrey Rolls.        TESTS  Imaging: Computed Tomographic Angiography:  Cardiac multidetector CT was performed paying particular attention to the coronary arteries for the diagnosis of: CAD. Diagnostic Drugs:  Administered iohexol (Omnipaque) through an antecubital vein and images from the examination were analyzed for the presence and extent of coronary artery disease, using 3D image processing software. 100 mL of  non-ionic contrast (Omnipaque) was used.        ASSESSMENT   The left main  artery was abnormal:1  The proximal LAD artery are abnormal:1  The mid-LAD artery was abnormal:1  The distal LAD artery was abnormal:1  The proximal circumflex artery was abnormal:1  The distal circumflex artery was abnormal:1  The first obtuse marginal branch artery (OM-1) was abnormal:1  The proximal right coronary artery (RCA) was abnormal:1  The mid right coronary artery (RCA) was abnormal:1  The distal right coronary artery (RCA) was abnormal:1  The posterior descending coronary arteries were abnormal:1     Postero-Lateral Branch:1     TEST CONCLUSIONS  Quality of study: Good  1-Calcium score: 538.3  2-Left dominant system.  3-Minor luminor irregulaties.      Neoma Laming MD  Electronically signed by: Neoma Laming     Date: 03/23/2018 12:52 REASON FOR VISIT  Visit for: Echocardiogram/Essention primary hypertension  Sex:   Male        wt= 247   lbs.  BP= 138/86  Height=  68  inches.        TESTS  Imaging: Echocardiogram:  An echocardiogram in (2-d) mode was performed and in Doppler mode with color flow velocity mapping was performed. The aortic valve cusps are abnormal 2  cm, flow velocity 1.5  m/s, and systolic calculated mean flow gradient 5  mmHg. Mitral valve diastolic peak flow velocity E 0.9   m/s and E/A ratio 1.1. Aortic root diameter 3.5  cm. The LVOT internal diameter 1.8  cm and flow velocity was abnormal 1.4  m/s. LV systolic dimension 3.1  cm, diastolic 4.9  cm, posterior wall thickness 1.2  cm, fractional shortening 36 %, and EF 64 %. IVS thickness 1.1  cm. LA dimension 3 cm  RIGHT atrium=  14.6  cm2. Mitral Valve =  Ea= 7.9  DT= 218 msec. Mitral Valve is Normal. Aortic Valve is Normal. Tricuspid Valve is Normal. Pulmonic Valve is Normal.     ASSESSMENT  Technically difficult study due to body habitus.  Ejection fraction-64  Left Ventricle- Normal size   Left  Ventricle diastolic dysfunction grade-Grade 1 relaxation abnormality  Right Ventricle- Mildly dilated  Normal right ventricular wall motion  Left Atrium-Normal size  Right Atrium-Normal size  Aortic valve-Trivial Aortic valve calcification with No stenosis, No regurgitation  Pulmonic Valve-No stenosis, no regurgitation  Mitral Valve-No regurgitation  Tricuspid Valve-No Regurgitation  No Pericardial effusion.     THERAPY   Referring physician: Dionisio David  Sonographer: Ruta Hinds.      Neoma Laming MD  Electronically signed by: Neoma Laming     Date: 09/01/2020 09:51 Other studies Reviewed: Additional studies/ records that were reviewed today include:  Review of the above records demonstrates:       No data to display            ASSESSMENT AND PLAN:    ICD-10-CM   1. Primary hypertension  I10     2. Obesity, diabetes, and hypertension syndrome (HCC)  E11.69    E66.9    E11.59    I15.2     3. Type 2 diabetes mellitus with stage 3 chronic kidney disease, without long-term current use of insulin, unspecified whether stage 3a or 3b CKD ([place])  E11.22    N18.30     4. CKD (chronic kidney disease), stage II  N18.2     5. Mixed hyperlipidemia  E78.2        Problem List Items Addressed This Visit       Cardiovascular and Mediastinum   Hypertension -  Primary    Well controlled      Obesity, diabetes, and hypertension syndrome ([place])     Endocrine   Type 2 diabetes mellitus with stage 3 chronic kidney disease, without long-term current use of insulin (HCC)     Genitourinary   CKD (chronic kidney disease), stage II     Other   Hyperlipidemia    Triglyceride high along with low HDL, already on vasepa, but crestor may be bettrer than lipitor. But he doesnot want to change. Advise exerise.          Disposition:   Return in about 3 months (around 07/02/2022).    Total time spent: 30 minutes  Signed,  Neoma Laming, MD  04/01/2022 10:22 AM     [facility]

## 2022-04-06 ENCOUNTER — Telehealth: Payer: Self-pay
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
## 2022-04-14 DIAGNOSIS — M5416 Radiculopathy, lumbar region: Secondary | ICD-10-CM | POA: Diagnosis not present

## 2022-04-14 DIAGNOSIS — M9905 Segmental and somatic dysfunction of pelvic region: Secondary | ICD-10-CM | POA: Diagnosis not present

## 2022-04-14 DIAGNOSIS — M5136 Other intervertebral disc degeneration, lumbar region: Secondary | ICD-10-CM | POA: Diagnosis not present

## 2022-04-14 DIAGNOSIS — M9903 Segmental and somatic dysfunction of lumbar region: Secondary | ICD-10-CM | POA: Diagnosis not present

## 2022-04-15 DIAGNOSIS — M48061 Spinal stenosis, lumbar region without neurogenic claudication: Secondary | ICD-10-CM | POA: Diagnosis not present

## 2022-04-15 DIAGNOSIS — M5136 Other intervertebral disc degeneration, lumbar region: Secondary | ICD-10-CM | POA: Diagnosis not present

## 2022-04-15 DIAGNOSIS — M791 Myalgia, unspecified site: Secondary | ICD-10-CM | POA: Diagnosis not present

## 2022-04-15 DIAGNOSIS — Z79899 Other long term (current) drug therapy: Secondary | ICD-10-CM | POA: Diagnosis not present

## 2022-04-15 DIAGNOSIS — M5126 Other intervertebral disc displacement, lumbar region: Secondary | ICD-10-CM | POA: Diagnosis not present

## 2022-04-15 DIAGNOSIS — M5416 Radiculopathy, lumbar region: Secondary | ICD-10-CM | POA: Diagnosis not present

## 2022-04-15 DIAGNOSIS — M545 Low back pain, unspecified: Secondary | ICD-10-CM | POA: Diagnosis not present

## 2022-04-16 ENCOUNTER — Encounter: Payer: 59 | Admitting: *Deleted

## 2022-04-19 ENCOUNTER — Ambulatory Visit: Payer: Self-pay | Admitting: *Deleted

## 2022-04-19 NOTE — Patient Outreach (Signed)
  Care Coordination   Follow Up Visit Note   04/19/2022 Name: Joshua Lee MRN: [id number] DOB: 1965/05/18  Joshua Lee is a 57 y.o. year old male who sees [name], DO for primary care. I spoke with  Joshua Lee by phone today.  What matters to the patients health and wellness today?  Report blood sugars are "good."  Has been having knee pain and hip/back pain.  Will possibly have injections for pain, will call [facility] for appointment.     Goals Addressed             This Visit's Progress    RNCM: Effective Management of DM       Care Coordination Interventions:  Lab Results  Component Value Date   HGBA1C 6.9 (H) 12/23/2021    Provided education to patient about basic DM disease process Reviewed medications with patient and discussed importance of medication adherence Counseled on importance of regular laboratory monitoring as prescribed Discussed plans with patient for ongoing care management follow up and provided patient with direct contact information for care management team Reviewed scheduled/upcoming provider appointments including: 4/4 with PCP, 6/14 with cardiology, will call [facility] for knee pain Advised patient, providing education and rationale, to check cbg as directed and record, calling pcp for findings outside established parameters Review of patient status, including review of consultants reports, relevant laboratory and other test results, and medications completed Will have repeat A1C this week        SDOH assessments and interventions completed:  No     Care Coordination Interventions:  Yes, provided   Interventions Today    Flowsheet Row Most Recent Value  Chronic Disease   Chronic disease during today's visit Diabetes, Other  [knee pain]  General Interventions   General Interventions Discussed/Reviewed General Interventions Reviewed, Labs, Doctor Visits  Labs Hgb A1c every 6 months  Doctor Visits  Discussed/Reviewed Doctor Visits Reviewed, PCP  PCP/Specialist Visits Compliance with follow-up visit       Follow up plan: Follow up call scheduled for 7/2    Encounter Outcome:  Pt. Visit Completed   Valente David, RN, MSN, [facility] Care Management Coordinator [phone]

## 2022-04-22 ENCOUNTER — Ambulatory Visit: Payer: 59 | Admitting: Family Medicine

## 2022-04-22 ENCOUNTER — Encounter: Payer: Self-pay | Admitting: Family Medicine

## 2022-04-22 VITALS — BP 131/76 | HR 83 | Temp 97.8°F | Ht 69.0 in | Wt 245.3 lb

## 2022-04-22 DIAGNOSIS — N182 Chronic kidney disease, stage 2 (mild): Secondary | ICD-10-CM | POA: Diagnosis not present

## 2022-04-22 DIAGNOSIS — I10 Essential (primary) hypertension: Secondary | ICD-10-CM | POA: Diagnosis not present

## 2022-04-22 DIAGNOSIS — Z1211 Encounter for screening for malignant neoplasm of colon: Secondary | ICD-10-CM | POA: Diagnosis not present

## 2022-04-22 DIAGNOSIS — E782 Mixed hyperlipidemia: Secondary | ICD-10-CM | POA: Diagnosis not present

## 2022-04-22 DIAGNOSIS — M79671 Pain in right foot: Secondary | ICD-10-CM

## 2022-04-22 DIAGNOSIS — E1122 Type 2 diabetes mellitus with diabetic chronic kidney disease: Secondary | ICD-10-CM | POA: Diagnosis not present

## 2022-04-22 DIAGNOSIS — N183 Chronic kidney disease, stage 3 unspecified: Secondary | ICD-10-CM

## 2022-04-22 LAB — BAYER DCA HB A1C WAIVED: HB A1C (BAYER DCA - WAIVED): 7.1 % — ABNORMAL HIGH (ref 4.8–5.6)

## 2022-04-22 MED ORDER — CONTOUR NEXT TEST VI STRP: 1.0000 | ORAL_STRIP | 12 refills | Status: DC | PRN

## 2022-04-22 MED ORDER — TRULICITY 3 MG/0.5ML ~~LOC~~ SOAJ: 3.0000 mg | SUBCUTANEOUS | 1 refills | Status: DC

## 2022-04-22 MED ORDER — ICOSAPENT ETHYL 1 G PO CAPS: 2.0000 g | ORAL_CAPSULE | Freq: Two times a day (BID) | ORAL | 1 refills | Status: DC

## 2022-04-22 MED ORDER — BENAZEPRIL HCL 40 MG PO TABS: 40.0000 mg | ORAL_TABLET | Freq: Every day | ORAL | 1 refills | Status: DC

## 2022-04-22 MED ORDER — METFORMIN HCL 500 MG PO TABS: ORAL_TABLET | ORAL | 1 refills | Status: DC

## 2022-04-22 MED ORDER — METOPROLOL TARTRATE 100 MG PO TABS: 100.0000 mg | ORAL_TABLET | Freq: Two times a day (BID) | ORAL | 1 refills | Status: DC

## 2022-04-22 MED ORDER — ATORVASTATIN CALCIUM 80 MG PO TABS
80.0000 mg | ORAL_TABLET | Freq: Every day | ORAL | 1 refills | Status: AC
Start: 2022-04-22 — End: ?

## 2022-04-22 NOTE — Progress Notes (Signed)
BP 131/76   Pulse 83   Temp 97.8 F (36.6 C) (Oral)   Ht 5\' 9"  (1.753 m)   Wt 245 lb 4.8 oz (111.3 kg)   SpO2 96%   BMI 36.22 kg/m    Subjective:    Patient ID: Joshua Lee, male    DOB: 08-Jun-1965, 57 y.o.   MRN: [id number]  HPI: Joshua Lee is a 57 y.o. male  Chief Complaint  Patient presents with   Hypertension   Diabetes   DIABETES Hypoglycemic episodes:no Polydipsia/polyuria: no Visual disturbance: no Chest pain: no Paresthesias: no Glucose Monitoring: yes  Accucheck frequency: Daily Taking Insulin?: no Blood Pressure Monitoring: not checking Retinal Examination: Up to Date Foot Exam: Up to Date Diabetic Education: Completed Pneumovax: Up to Date Influenza: Up to Date Aspirin: no  HYPERTENSION / HYPERLIPIDEMIA Satisfied with current treatment? yes Duration of hypertension: chronic BP monitoring frequency: not checking BP medication side effects: no Past BP meds: amlodipine, metoprolol Duration of hyperlipidemia: chronic Cholesterol medication side effects: no Cholesterol supplements: none Past cholesterol medications: atorvastatin, vescepa Medication compliance: excellent compliance Aspirin: yes Recent stressors: no Recurrent headaches: no Visual changes: no Palpitations: no Dyspnea: no Chest pain: no Lower extremity edema: no Dizzy/lightheaded: no   Relevant past medical, surgical, family and social history reviewed and updated as indicated. Interim medical history since our last visit reviewed. Allergies and medications reviewed and updated.  Review of Systems  Constitutional: Negative.   Respiratory: Negative.    Cardiovascular: Negative.   Gastrointestinal: Negative.   Musculoskeletal:  Positive for arthralgias and myalgias. Negative for back pain, gait problem, joint swelling, neck pain and neck stiffness.       R foot pain  Skin: Negative.   Neurological: Negative.   Psychiatric/Behavioral: Negative.      Per HPI  unless specifically indicated above     Objective:    BP 131/76   Pulse 83   Temp 97.8 F (36.6 C) (Oral)   Ht 5\' 9"  (1.753 m)   Wt 245 lb 4.8 oz (111.3 kg)   SpO2 96%   BMI 36.22 kg/m   Wt Readings from Last 3 Encounters:  04/22/22 245 lb 4.8 oz (111.3 kg)  04/01/22 244 lb (110.7 kg)  01/20/22 242 lb 1.6 oz (109.8 kg)    Physical Exam Vitals and nursing note reviewed.  Constitutional:      General: He is not in acute distress.    Appearance: Normal appearance. He is not ill-appearing, toxic-appearing or diaphoretic.  HENT:     Head: Normocephalic and atraumatic.     Right Ear: External ear normal.     Left Ear: External ear normal.     Nose: Nose normal.     Mouth/Throat:     Mouth: Mucous membranes are moist.     Pharynx: Oropharynx is clear.  Eyes:     General: No scleral icterus.       Right eye: No discharge.        Left eye: No discharge.     Extraocular Movements: Extraocular movements intact.     Conjunctiva/sclera: Conjunctivae normal.     Pupils: Pupils are equal, round, and reactive to light.  Cardiovascular:     Rate and Rhythm: Normal rate and regular rhythm.     Pulses: Normal pulses.     Heart sounds: Normal heart sounds. No murmur heard.    No friction rub. No gallop.  Pulmonary:     Effort: Pulmonary effort is normal. No  respiratory distress.     Breath sounds: Normal breath sounds. No stridor. No wheezing, rhonchi or rales.  Chest:     Chest wall: No tenderness.  Musculoskeletal:        General: Normal range of motion.     Cervical back: Normal range of motion and neck supple.  Skin:    General: Skin is warm and dry.     Capillary Refill: Capillary refill takes less than 2 seconds.     Coloration: Skin is not jaundiced or pale.     Findings: No bruising, erythema, lesion or rash.  Neurological:     General: No focal deficit present.     Mental Status: He is alert and oriented to person, place, and time. Mental status is at baseline.   Psychiatric:        Mood and Affect: Mood normal.        Behavior: Behavior normal.        Thought Content: Thought content normal.        Judgment: Judgment normal.     Results for orders placed or performed in visit on 01/08/22  HM DIABETES EYE EXAM  Result Value Ref Range   HM Diabetic Eye Exam No Retinopathy No Retinopathy      Assessment & Plan:   Problem List Items Addressed This Visit       Cardiovascular and Mediastinum   Hypertension    Under good control on current regimen. Continue current regimen. Continue to monitor. Call with any concerns. Refills given. Labs drawn today.       Relevant Medications   atorvastatin (LIPITOR) 80 MG tablet   benazepril (LOTENSIN) 40 MG tablet   icosapent Ethyl (VASCEPA) 1 g capsule   metoprolol tartrate (LOPRESSOR) 100 MG tablet   Other Relevant Orders   Comprehensive metabolic panel   CBC with Differential/Platelet     Endocrine   Type 2 diabetes mellitus with stage 3 chronic kidney disease, without long-term current use of insulin   Relevant Medications   atorvastatin (LIPITOR) 80 MG tablet   benazepril (LOTENSIN) 40 MG tablet   Dulaglutide (TRULICITY) 3 0000000 SOPN   metFORMIN (GLUCOPHAGE) 500 MG tablet   Other Relevant Orders   Bayer DCA Hb A1c Waived   Comprehensive metabolic panel   CBC with Differential/Platelet     Genitourinary   CKD (chronic kidney disease), stage II - Primary    Under good control on current regimen. Continue current regimen. Continue to monitor. Call with any concerns. Refills given. Labs drawn today.       Relevant Orders   Comprehensive metabolic panel   CBC with Differential/Platelet     Other   Hyperlipidemia    Under good control on current regimen. Continue current regimen. Continue to monitor. Call with any concerns. Refills given. Labs drawn today.        Relevant Medications   atorvastatin (LIPITOR) 80 MG tablet   benazepril (LOTENSIN) 40 MG tablet   icosapent Ethyl  (VASCEPA) 1 g capsule   metoprolol tartrate (LOPRESSOR) 100 MG tablet   Other Relevant Orders   Comprehensive metabolic panel   CBC with Differential/Platelet   Lipid Panel w/o Chol/HDL Ratio   Other Visit Diagnoses     Right foot pain       Will get him back into see podiatry. Appointment scheduled.   Screening for colon cancer       Cologuard ordered. Await results.   Relevant Orders   Cologuard  Follow up plan: Return in about 3 months (around 07/22/2022).

## 2022-04-22 NOTE — Assessment & Plan Note (Signed)
Under good control on current regimen. Continue current regimen. Continue to monitor. Call with any concerns. Refills given. Labs drawn today.   

## 2022-04-23 ENCOUNTER — Encounter: Payer: Self-pay | Admitting: Family Medicine

## 2022-04-23 LAB — CBC WITH DIFFERENTIAL/PLATELET
Basophils Absolute: 0 10*3/uL (ref 0.0–0.2)
Basos: 0 %
EOS (ABSOLUTE): 0.1 10*3/uL (ref 0.0–0.4)
Eos: 1 %
Hematocrit: 45 % (ref 37.5–51.0)
Hemoglobin: 14.5 g/dL (ref 13.0–17.7)
Immature Grans (Abs): 0 10*3/uL (ref 0.0–0.1)
Immature Granulocytes: 0 %
Lymphocytes Absolute: 1.5 10*3/uL (ref 0.7–3.1)
Lymphs: 17 %
MCH: 27.3 pg (ref 26.6–33.0)
MCHC: 32.2 g/dL (ref 31.5–35.7)
MCV: 85 fL (ref 79–97)
Monocytes Absolute: 0.6 10*3/uL (ref 0.1–0.9)
Monocytes: 7 %
Neutrophils Absolute: 6.7 10*3/uL (ref 1.4–7.0)
Neutrophils: 75 %
Platelets: 243 10*3/uL (ref 150–450)
RBC: 5.31 x10E6/uL (ref 4.14–5.80)
RDW: 14.2 % (ref 11.6–15.4)
WBC: 9.1 10*3/uL (ref 3.4–10.8)

## 2022-04-23 LAB — LIPID PANEL W/O CHOL/HDL RATIO
Cholesterol, Total: 110 mg/dL (ref 100–199)
HDL: 31 mg/dL — ABNORMAL LOW (ref >39)
LDL Chol Calc (NIH): 51 mg/dL (ref 0–99)
Triglycerides: 164 mg/dL — ABNORMAL HIGH (ref 0–149)
VLDL Cholesterol Cal: 28 mg/dL (ref 5–40)

## 2022-04-23 LAB — COMPREHENSIVE METABOLIC PANEL
ALT: 21 IU/L (ref 0–44)
AST: 17 IU/L (ref 0–40)
Albumin/Globulin Ratio: 1.7 (ref 1.2–2.2)
Albumin: 4.4 g/dL (ref 3.8–4.9)
Alkaline Phosphatase: 54 IU/L (ref 44–121)
BUN/Creatinine Ratio: 19 (ref 9–20)
BUN: 13 mg/dL (ref 6–24)
Bilirubin Total: 0.5 mg/dL (ref 0.0–1.2)
CO2: 22 mmol/L (ref 20–29)
Calcium: 9.6 mg/dL (ref 8.7–10.2)
Chloride: 97 mmol/L (ref 96–106)
Creatinine, Ser: 0.69 mg/dL — ABNORMAL LOW (ref 0.76–1.27)
Globulin, Total: 2.6 g/dL (ref 1.5–4.5)
Glucose: 143 mg/dL — ABNORMAL HIGH (ref 70–99)
Potassium: 4.1 mmol/L (ref 3.5–5.2)
Sodium: 138 mmol/L (ref 134–144)
Total Protein: 7 g/dL (ref 6.0–8.5)
eGFR: 109 mL/min/{1.73_m2} (ref >59)

## 2022-05-06 DIAGNOSIS — Z03818 Encounter for observation for suspected exposure to other biological agents ruled out: Secondary | ICD-10-CM | POA: Diagnosis not present

## 2022-05-06 DIAGNOSIS — H61193 Noninfective disorders of pinna, bilateral: Secondary | ICD-10-CM | POA: Diagnosis not present

## 2022-05-06 DIAGNOSIS — J029 Acute pharyngitis, unspecified: Secondary | ICD-10-CM | POA: Diagnosis not present

## 2022-05-06 DIAGNOSIS — L54 Erythema in diseases classified elsewhere: Secondary | ICD-10-CM | POA: Diagnosis not present

## 2022-05-12 DIAGNOSIS — M5136 Other intervertebral disc degeneration, lumbar region: Secondary | ICD-10-CM | POA: Diagnosis not present

## 2022-05-12 DIAGNOSIS — M5416 Radiculopathy, lumbar region: Secondary | ICD-10-CM | POA: Diagnosis not present

## 2022-05-12 DIAGNOSIS — M9903 Segmental and somatic dysfunction of lumbar region: Secondary | ICD-10-CM | POA: Diagnosis not present

## 2022-05-12 DIAGNOSIS — M9905 Segmental and somatic dysfunction of pelvic region: Secondary | ICD-10-CM | POA: Diagnosis not present

## 2022-05-16 DIAGNOSIS — Z1211 Encounter for screening for malignant neoplasm of colon: Secondary | ICD-10-CM | POA: Diagnosis not present

## 2022-05-21 DIAGNOSIS — E119 Type 2 diabetes mellitus without complications: Secondary | ICD-10-CM | POA: Diagnosis not present

## 2022-05-21 DIAGNOSIS — M5416 Radiculopathy, lumbar region: Secondary | ICD-10-CM | POA: Diagnosis not present

## 2022-05-24 LAB — COLOGUARD: COLOGUARD: NEGATIVE

## 2022-05-25 ENCOUNTER — Ambulatory Visit (INDEPENDENT_AMBULATORY_CARE_PROVIDER_SITE_OTHER): Payer: 59

## 2022-05-25 ENCOUNTER — Ambulatory Visit: Payer: 59 | Admitting: Podiatry

## 2022-05-25 DIAGNOSIS — B351 Tinea unguium: Secondary | ICD-10-CM | POA: Diagnosis not present

## 2022-05-25 DIAGNOSIS — M7751 Other enthesopathy of right foot: Secondary | ICD-10-CM

## 2022-05-25 DIAGNOSIS — M79675 Pain in left toe(s): Secondary | ICD-10-CM | POA: Diagnosis not present

## 2022-05-25 DIAGNOSIS — M778 Other enthesopathies, not elsewhere classified: Secondary | ICD-10-CM | POA: Diagnosis not present

## 2022-05-25 DIAGNOSIS — M79674 Pain in right toe(s): Secondary | ICD-10-CM

## 2022-05-25 MED ORDER — BETAMETHASONE SOD PHOS & ACET 6 (3-3) MG/ML IJ SUSP
3.0000 mg | Freq: Once | INTRAMUSCULAR | Status: AC
  Administered 2022-05-25: 3 mg via INTRA_ARTICULAR

## 2022-05-25 NOTE — Progress Notes (Signed)
   Chief Complaint  Patient presents with   Foot Pain    Patient came in today for right foot pain, top of the foot, swelling and pain, rate of pain 5 out of 10, Diabetic A1c-7.1    HPI: 57 y.o. male presenting today a for evaluation of right ankle pain ongoing for few months now.  Last seen in the office on 01/15/2022.  At that time he received an injection in the lateral aspect of the right midfoot and he says it helped significantly.  Patient also requesting nail debridement today.  He says his nails are tender and painful especially close to his shoes.  He is unable to get down to trim his nails  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   Procedure: TOTAL HIP ARTHROPLASTY ANTERIOR APPROACH;  Surgeon: Kennedy Bucker, MD;  Location: [facility] ORS;  Service: Orthopedics;  Laterality: Right;    Allergies  Allergen Reactions   Penicillins Rash    Has patient had a PCN reaction causing immediate rash, facial/tongue/throat swelling, SOB or lightheadedness with hypotension: unknown Has patient had a PCN reaction causing severe rash involving mucus membranes or skin necrosis: unknown Has patient had a PCN reaction that required hospitalization: unknown Has patient had a PCN reaction occurring within the last 10 years: no If all of the above answers are "NO", then may proceed with Cephalosporin use.      Physical Exam: General: The patient is alert and oriented x3 in no acute distress.  Dermatology: Skin is warm, dry and supple bilateral lower extremities. Negative for open lesions or macerations.  Hyperkeratotic dystrophic elongated  nails noted 1-5 bilateral with associated tenderness  Vascular: Palpable pedal pulses bilaterally. Capillary refill within normal limits.  Negative for any significant edema or erythema  Neurological: Grossly active via light touch  Musculoskeletal Exam: No pedal deformities noted.  Pain and tenderness associated to palpation of the lateral aspect of the right ankle  Assessment: 1.  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  Pain due to onychomycosis of toenails both -Patient evaluated.  -Injection of 0.5 cc Celestone Soluspan injection the lateral aspect of the right ankle -Recommend good supportive shoes and sneakers that do not irritate the lateral aspect of the foot -Continue topical diclofenac 1% as needed -Mechanical debridement of nails 1-5 bilateral was performed using a nail nipper without incident or bleeding -Return to clinic as needed     Felecia Shelling, DPM [facility]  Dr. Felecia Shelling, DPM    [address] [place], [place] [address]                Office [phone]  Fax [phone]

## 2022-05-26 ENCOUNTER — Telehealth: Payer: Self-pay | Admitting: Family Medicine

## 2022-05-26 NOTE — Telephone Encounter (Signed)
Can you please let Bell know that Elnita Maxwell is going to call him to get him a pharmacy appointment- she's the pharmacist who helped him in December and we'll see about getting his meds sorted. Thanks!

## 2022-05-26 NOTE — Telephone Encounter (Signed)
[name], you helped with Joshua Lee back in the winter. I'm happy to switch him to ozempic for supply issues, but he gets very anxious about any medication changes and I don't know what the cost will be- do you have time to put him on your schedule? I'm happy to put in a new referral if you need it.

## 2022-05-26 NOTE — Telephone Encounter (Signed)
Pt states that his medication: Dulaglutide (TRULICITY) 3 MG/0.5ML SOPN Is on a national backorder and his pharmacy does not have the medication and has been out of the medication for over a week and do not know when they will get any in.  Pt is wanting to know if something else can be called in for him that is affordable. Per pt he was getting his medication for $25 with the coupon. Pt is taking his last shot today.     [id number] [place], [place] - [address] Phone: [phone]  Fax: [phone]

## 2022-05-27 ENCOUNTER — Telehealth: Payer: Self-pay

## 2022-05-27 NOTE — Progress Notes (Signed)
   05/27/2022  Patient ID: Joshua Lee, male   DOB: 1966-01-17, 57 y.o.   MRN: [id number]  Clinic request from PCP, Dr. Laural Benes, to assist patient with access to Trulicity.  [facility] 3mg  weekly dose in stock currently.  Patient prefers to stay on this medication due to ability to get for $25 and hesitancy to change medications.  Will look into any pharmacies contracted with his insurance that may have product.  Otherwise, will see what alternative GLP1's are covered on formulary and may have manufacturer coupon.   Telephone visit scheduled to discuss options Monday 5/13 at 11:30am.  Lenna Gilford, PharmD, DPLA

## 2022-05-27 NOTE — Telephone Encounter (Signed)
Called and notified patient of Dr. Johnson's message.  

## 2022-05-31 ENCOUNTER — Other Ambulatory Visit: Payer: Self-pay | Admitting: Family Medicine

## 2022-05-31 ENCOUNTER — Other Ambulatory Visit: Payer: 59

## 2022-05-31 MED ORDER — SEMAGLUTIDE (1 MG/DOSE) 4 MG/3ML ~~LOC~~ SOPN: 1.0000 mg | PEN_INJECTOR | SUBCUTANEOUS | 3 refills | Status: DC

## 2022-05-31 NOTE — Progress Notes (Signed)
   05/31/2022  Patient ID: Joshua Lee, male   DOB: 04/30/1965, 57 y.o.   MRN: [id number]  [facility], and [facility]pic is requiring a prior authorization; and the PA for Jardiance has expired.  Pharmacy states they have initiated this process and faxed forms to Dr. Henriette Combs office.  Reaching out to make sure they have received these and contacting [name] to notify.  Lenna Gilford, PharmD, DPLA

## 2022-05-31 NOTE — Addendum Note (Signed)
Addended by: [name] on: 05/31/2022 04:47 PM   Modules accepted: Orders

## 2022-05-31 NOTE — Progress Notes (Signed)
   05/31/2022  Patient ID: [name], male   DOB: 1965-06-25, 57 y.o.   MRN: [id number]  Subjective/Objective: Telephone visit to discuss available treatment options, as patient's pharmacy is not able to get Trulicity 3mg .  Diabetes Management Plan  -[facility]  -Patient hesitant to change medications because he is tolerating well and able to get for $25/month with copay card [employer] plan, and they will only cover medications at [facility] -Mr. Clingan previously on Ozempic and states it worked well for him; he is open to switching back if insurance will cover and copay card can be applied -[facility]ed [facility] to verify they are currently able to get Ozempic, and they are at this time  Assessment/Plan:  Diabetes Management Plan -Pending prescription for Ozempic 1mg  weekly, which would be equivalent dosing to the Trulicity 3mg  dose Mr. Kirschman has been on.  This is also the dose he was previously on when taking Ozempic. -Obtained copay card and included processing information for [facility] on the prescription -Once order signed, will call pharmacy to verify payment by insurance -Can pursue a prior authorization if needed -Patient out of Trulicity and due for injection Wednesday; will see if [facility] has samples  Follow-up:  Will contact patient to provide information around medication cost and availability of sample  Lenna Gilford, PharmD, DPLA

## 2022-06-01 ENCOUNTER — Telehealth: Payer: Self-pay

## 2022-06-01 NOTE — Progress Notes (Unsigned)
   06/01/2022  Patient ID: Joshua Lee, male   DOB: Feb 28, 1965, 57 y.o.   MRN: [id number]  [place] [employer] approved and going through for $10 Appealing Ozempic Office contact rep for samples?

## 2022-06-03 ENCOUNTER — Ambulatory Visit: Payer: Self-pay

## 2022-06-03 NOTE — Telephone Encounter (Signed)
  Chief Complaint: medication assistance  Symptoms: NA Frequency: today Pertinent Negatives: NA Disposition: [] ED /[] Urgent Care (no appt availability in office) / [] Appointment(In office/virtual)/ []  [facility]/ [] Home Care/ [] Refused Recommended Disposition /[] [facility]/ [x]  Follow-up with PCP Additional Notes: pt states he picked up sample pen of Ozempic and received wrong dosage pen of 0.25/0.5 mg and he is prescribed 1 mg. Pt is upset and thought he was getting correct dose so he could go ahead and take it today since he has been without for 2 weeks since shortage on Trulicity. I advised pt that he is prescribed 1 mg Ozempic and he could take 0.5 mg twice to make that dosage or wait until someone in office can FU with him. Pt states that he picked up the sample and no one explained him anything just handed him a box. Pt kept explaining same over and over and I advised him that I would have nurse call him in morning to let him what he is suppose to do.   Summary: Ozempic Advice   Pt would like to know how to take the ozempic dosage. Please advise         Reason for Disposition  [1] Caller has URGENT medicine question about med that PCP or specialist prescribed AND [2] triager unable to answer question  Answer Assessment - Initial Assessment Questions 1. NAME of MEDICINE: "What medicine(s) are you calling about?"     Ozempic  2. QUESTION: "What is your question?" (e.g., double dose of medicine, side effect)     Pt picked up sample and it is the wrong dosage pen  3. PRESCRIBER: "Who prescribed the medicine?" Reason: if prescribed by specialist, call should be referred to that group.     Dr. Laural Benes  Protocols used: Medication Question Call-A-AH

## 2022-06-04 ENCOUNTER — Telehealth: Payer: Self-pay

## 2022-06-04 ENCOUNTER — Ambulatory Visit: Payer: 59

## 2022-06-04 ENCOUNTER — Telehealth: Payer: Self-pay | Admitting: Family Medicine

## 2022-06-04 NOTE — Telephone Encounter (Signed)
Called patient to make him aware of Dr Henriette Combs recommendations and to get him in for a Nurse Visit only today to be shown how to use the pen per Dr Laural Benes. Patient would need to be scheduled before 3 PM.   OK for [facility] to give note if patient calls back.

## 2022-06-04 NOTE — Progress Notes (Signed)
Patient presented to the office to be instructed on Ozempic medication. Patient was to be given 2 injections of the 0.5 mg to equal his weekly dose of 1 mg. Patient's questions answered and verbalized instructions.

## 2022-06-04 NOTE — Telephone Encounter (Signed)
We do not have any samples of the 1mg  dose. He can give himself 2 doses of the 0.5mg  ozempic until he gets his 1mg  pen

## 2022-06-04 NOTE — Telephone Encounter (Signed)
See other telephone encounter with same information.

## 2022-06-04 NOTE — Progress Notes (Signed)
   06/04/2022  Patient ID: Joshua Lee, male   DOB: April 04, 1965, 57 y.o.   MRN: [id number]  Returning patient call/voicemail from yesterday evening.  Received Ozempic sample from Dr. Henriette Combs office and questioning dose since sample can only administer 0.25/0.5mg  doses.    Joshua Lee also called provider's office with same question, and Dr. Henriette Combs note stated patient can give 2 injections of the 0.5mg  to equal 1mg  while we work on appeal for insurance coverage of his Ozempic 1mg .  Provided patient education around dose and administration.  Using teach back method, he was able to go through all steps for 1st and 2nd dose administration.  Attempted to re-send PA to insurance with additional documentation, but this was still denied.  Completing appeal documentation today and sending to [facility] to fax in.  Will contact patient as soon as decision is made.  Lenna Gilford, PharmD, DPLA

## 2022-06-04 NOTE — Telephone Encounter (Signed)
Copied from CRM [id number]. Topic: General - Inquiry >> Jun 03, 2022  4:39 PM Runell Gess P wrote:  CRM  Pt called saying he picked up a sample of Ozempic from the office this afternoon but he is not sure how to use it because he says the dosing is different.  CB#  [phone]

## 2022-06-04 NOTE — Telephone Encounter (Signed)
It is a Education officer, community. Please have him come in to be shown how to give himself his shot- he has done it previously, but he clearly doesn't remember.

## 2022-06-09 DIAGNOSIS — M5136 Other intervertebral disc degeneration, lumbar region: Secondary | ICD-10-CM | POA: Diagnosis not present

## 2022-06-09 DIAGNOSIS — M9905 Segmental and somatic dysfunction of pelvic region: Secondary | ICD-10-CM | POA: Diagnosis not present

## 2022-06-09 DIAGNOSIS — M5416 Radiculopathy, lumbar region: Secondary | ICD-10-CM | POA: Diagnosis not present

## 2022-06-09 DIAGNOSIS — M9903 Segmental and somatic dysfunction of lumbar region: Secondary | ICD-10-CM | POA: Diagnosis not present

## 2022-06-10 ENCOUNTER — Telehealth: Payer: Self-pay

## 2022-06-11 NOTE — Progress Notes (Signed)
   06/11/2022  Patient ID: Joshua Lee, male   DOB: 25-Jan-1965, 57 y.o.   MRN: [id number]  Returning patient's call/voicemail checking on appeal status for PA.  Called insurance today, and they were not able to provide any update.  Will contact again Tuesday.  Unable to reach [name] but left a voicemail to inform I had no update at this time but will follow-up Tuesday.  Lenna Gilford, PharmD, DPLA

## 2022-06-15 ENCOUNTER — Telehealth: Payer: Self-pay

## 2022-06-15 NOTE — Progress Notes (Signed)
   06/15/2022  Patient ID: Joshua Lee, male   DOB: 11-15-1965, 57 y.o.   MRN: [id number]  [name] contacted me today to check on status of Ozempic appeal.  I was able to reach an appeal representative with [employer] who informed me the review process was delayed.  She was able to provide me with the Case ID number and appeal form document control number, as well as the phone number to expedite the request.  [name] used his last injection from the sample provided Friday.  Left message on the expedited request line, but the recording did state it can take 24 hours to get a response.    Will see if office has another box or two of samples, so he does not go without this coming Friday's dose.

## 2022-06-24 ENCOUNTER — Telehealth: Payer: Self-pay

## 2022-06-24 NOTE — Progress Notes (Signed)
   06/24/2022  Patient ID: Joshua Lee, male   DOB: 09-05-1965, 57 y.o.   MRN: [id number]  Contacted patient's insurance to check on status of Ozempic 1mg  appeal, and this is still being processed with a completion due date of 07/15/22 (case ID:  [id number]).  Patient may have enough of 1mg  sample to last just up until then.  In the event the appeal is denied, or he runs out of sample before decision made- [facility] to see if [facility] have been able to get 3mg  Trulicity in stock.  They endorse it coming on order intermittently; so I requested they go ahead and process his 3mg  and try to order as a back up to Ozempic.  Trulicity going through for $289 because copay card on file rejecting; patient has met the max amount benefit.    Contacted patient to make him aware and let him know I will check on the appeal status closer to the end of the month.  If appeal denied, we may need to look at other treatment options such as Saxenda or other oral agents.  Note, [name] is being phased out; so this likely not a great option but could mean insurance will add another alternative.  Patient aware of above information.  Lenna Gilford, PharmD, DPLA

## 2022-07-02 ENCOUNTER — Ambulatory Visit: Payer: 59 | Admitting: Cardiovascular Disease

## 2022-07-02 ENCOUNTER — Encounter: Payer: Self-pay | Admitting: Cardiovascular Disease

## 2022-07-02 VITALS — BP 132/74 | HR 85 | Ht 69.0 in | Wt 237.0 lb

## 2022-07-02 DIAGNOSIS — E1159 Type 2 diabetes mellitus with other circulatory complications: Secondary | ICD-10-CM | POA: Diagnosis not present

## 2022-07-02 DIAGNOSIS — E669 Obesity, unspecified: Secondary | ICD-10-CM

## 2022-07-02 DIAGNOSIS — I10 Essential (primary) hypertension: Secondary | ICD-10-CM

## 2022-07-02 DIAGNOSIS — I152 Hypertension secondary to endocrine disorders: Secondary | ICD-10-CM | POA: Diagnosis not present

## 2022-07-02 DIAGNOSIS — I2585 Chronic coronary microvascular dysfunction: Secondary | ICD-10-CM

## 2022-07-02 DIAGNOSIS — N182 Chronic kidney disease, stage 2 (mild): Secondary | ICD-10-CM

## 2022-07-02 DIAGNOSIS — E782 Mixed hyperlipidemia: Secondary | ICD-10-CM | POA: Diagnosis not present

## 2022-07-02 DIAGNOSIS — E1169 Type 2 diabetes mellitus with other specified complication: Secondary | ICD-10-CM

## 2022-07-02 NOTE — Progress Notes (Signed)
Cardiology Office Note   Date:  07/02/2022   ID:  Joshua Lee, DOB 26-Apr-1965, MRN [id number]  PCP:  [name], DO  Cardiologist:  Adrian Blackwater, MD      History of Present Illness: Joshua Lee is a 57 y.o. male who presents for  Chief Complaint  Patient presents with   Follow-up    3 months follow up    Feeling good.      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     amLODipine (NORVASC) 2.5 MG tablet Take 1 tablet (2.5 mg total) by mouth daily. 90 tablet 1   aspirin 81 MG tablet Take 81 mg by mouth daily.     atorvastatin (LIPITOR) 80 MG tablet Take 1 tablet (80 mg total) by mouth at bedtime. 90 tablet 1   benazepril (LOTENSIN) 40 MG tablet Take 1 tablet (40 mg total) by mouth daily. 90 tablet 1   Coenzyme Q-10 100 MG capsule Take 100 mg by mouth daily.     cyclobenzaprine (FLEXERIL) 10 MG tablet Take 1 tablet (10 mg total) by mouth at bedtime. 30 tablet 1   diclofenac Sodium (VOLTAREN) 1 % GEL Apply 4 g topically 4 (four) times daily. 350 g 2   empagliflozin (JARDIANCE) 25 MG TABS tablet Take 1 tablet (25 mg total) by mouth daily before breakfast. 90 tablet 1   fluticasone (FLONASE) 50 MCG/ACT nasal spray Place 1 spray into both nostrils 2 (two) times daily. 16 g 12    gabapentin (NEURONTIN) 300 MG capsule Take 300 mg by mouth 3 (three) times daily as needed.     Garlic 1000 MG CAPS Take 1,000 mg by mouth daily.     glucose blood (CONTOUR NEXT TEST) test strip 1 each by Other route as needed for other. 100 strip 12   icosapent Ethyl (VASCEPA) 1 g capsule Take 2 capsules (2 g total) by mouth 2 (two) times daily. 180 capsule 1   metFORMIN (GLUCOPHAGE) 500 MG tablet TAKE 2 TABLETS(1000 MG) BY MOUTH TWICE DAILY WITH A MEAL 360 tablet 1   metoprolol tartrate (LOPRESSOR) 100 MG tablet Take 1 tablet (100 mg total) by mouth 2 (two) times daily. 180 tablet 1   Potassium Citrate 15 MEQ (1620 MG) TBCR Take 15 mEq by mouth 2 (two) times daily. 2 tab QAM, 1 tab QPM  1   saline (AYR) GEL Place 1 Application into both nostrils every 6 (six) hours as needed. 14.1 g 6   Semaglutide, 1 MG/DOSE, 4 MG/3ML SOPN Inject 1 mg as directed once a week. 3 mL 3   tamsulosin (FLOMAX) 0.4 MG CAPS capsule TAKE 1 CAPSULE (0.4 MG) BY MOUTH DAILY 90 capsule 1   traMADol (  ULTRAM) 50 MG tablet Take 50 mg by mouth every 12 (twelve) hours as needed.     No current facility-administered medications for this visit.    Allergies:   Penicillins    Social History:   reports that he has never smoked. His smokeless tobacco use includes snuff. He reports that he does not currently use alcohol. He reports that he does not use drugs.   Family History:  family history includes Diabetes in his maternal uncle.    ROS:     Review of Systems  Constitutional: Negative.   HENT: Negative.    Eyes: Negative.   Respiratory: Negative.    Gastrointestinal: Negative.   Genitourinary: Negative.   Musculoskeletal: Negative.   Skin: Negative.   Neurological: Negative.   Endo/Heme/Allergies: Negative.   Psychiatric/Behavioral: Negative.    All other systems reviewed and are negative.     All other systems are reviewed and negative.    PHYSICAL EXAM: VS:  BP 132/74   Pulse 85   Ht 5\' 9"  (1.753 m)    Wt 237 lb (107.5 kg)   SpO2 97%   BMI 35.00 kg/m  , BMI Body mass index is 35 kg/m. Last weight:  Wt Readings from Last 3 Encounters:  07/02/22 237 lb (107.5 kg)  04/22/22 245 lb 4.8 oz (111.3 kg)  04/01/22 244 lb (110.7 kg)     Physical Exam Vitals reviewed.  Constitutional:      Appearance: Normal appearance. He is normal weight.  HENT:     Head: Normocephalic.     Nose: Nose normal.     Mouth/Throat:     Mouth: Mucous membranes are moist.  Eyes:     Pupils: Pupils are equal, round, and reactive to light.  Cardiovascular:     Rate and Rhythm: Normal rate and regular rhythm.     Pulses: Normal pulses.     Heart sounds: Normal heart sounds.  Pulmonary:     Effort: Pulmonary effort is normal.  Abdominal:     General: Abdomen is flat. Bowel sounds are normal.  Musculoskeletal:        General: Normal range of motion.     Cervical back: Normal range of motion.  Skin:    General: Skin is warm.  Neurological:     General: No focal deficit present.     Mental Status: He is alert.  Psychiatric:        Mood and Affect: Mood normal.       EKG:   Recent Labs: 09/23/2021: TSH 3.020 04/22/2022: ALT 21; BUN 13; Creatinine, Ser 0.69; Hemoglobin 14.5; Platelets 243; Potassium 4.1; Sodium 138    Lipid Panel    Component Value Date/Time   CHOL 110 04/22/2022 1130   CHOL 108 08/29/2018 1045   TRIG 164 (H) 04/22/2022 1130   TRIG 221 (H) 08/29/2018 1045   HDL 31 (L) 04/22/2022 1130   CHOLHDL 6.5 (H) 02/09/2017 1002   VLDL 44 (H) 08/29/2018 1045   LDLCALC 51 04/22/2022 1130      Other studies Reviewed: Additional studies/ records that were reviewed today include:  Review of the above records demonstrates:       No data to display            ASSESSMENT AND PLAN:    ICD-10-CM   1. Primary hypertension  I10 PCV ECHOCARDIOGRAM COMPLETE   BP better, but did not take meds yet    2. Obesity, diabetes, and hypertension syndrome (HCC)  E11.69 PCV  ECHOCARDIOGRAM  COMPLETE   E66.9    E11.59    I15.2     3. Mixed hyperlipidemia  E78.2 PCV ECHOCARDIOGRAM COMPLETE    4. Morbid obesity (HCC)  E66.01 PCV ECHOCARDIOGRAM COMPLETE    5. CKD (chronic kidney disease), stage II  N18.2 PCV ECHOCARDIOGRAM COMPLETE    6. Chronic coronary microvascular dysfunction  I25.85 PCV ECHOCARDIOGRAM COMPLETE       Problem List Items Addressed This Visit       Cardiovascular and Mediastinum   Hypertension - Primary   Relevant Orders   PCV ECHOCARDIOGRAM COMPLETE   Obesity, diabetes, and hypertension syndrome (HCC)   Relevant Orders   PCV ECHOCARDIOGRAM COMPLETE     Genitourinary   CKD (chronic kidney disease), stage II   Relevant Orders   PCV ECHOCARDIOGRAM COMPLETE     Other   Hyperlipidemia   Relevant Orders   PCV ECHOCARDIOGRAM COMPLETE   Morbid obesity (HCC)   Relevant Orders   PCV ECHOCARDIOGRAM COMPLETE   Other Visit Diagnoses     Chronic coronary microvascular dysfunction       Relevant Orders   PCV ECHOCARDIOGRAM COMPLETE          Disposition:   Return in about 4 weeks (around 07/30/2022) for echo and f/u.    Total time spent: 30 minutes  Signed,  Adrian Blackwater, MD  07/02/2022 10:33 AM    [facility]

## 2022-07-06 DIAGNOSIS — N200 Calculus of kidney: Secondary | ICD-10-CM | POA: Diagnosis not present

## 2022-07-07 ENCOUNTER — Telehealth: Payer: Self-pay

## 2022-07-07 DIAGNOSIS — M5136 Other intervertebral disc degeneration, lumbar region: Secondary | ICD-10-CM | POA: Diagnosis not present

## 2022-07-07 DIAGNOSIS — M9905 Segmental and somatic dysfunction of pelvic region: Secondary | ICD-10-CM | POA: Diagnosis not present

## 2022-07-07 DIAGNOSIS — M5416 Radiculopathy, lumbar region: Secondary | ICD-10-CM | POA: Diagnosis not present

## 2022-07-07 DIAGNOSIS — M9903 Segmental and somatic dysfunction of lumbar region: Secondary | ICD-10-CM | POA: Diagnosis not present

## 2022-07-07 NOTE — Progress Notes (Unsigned)
   07/07/2022  Patient ID: Joshua Lee, male   DOB: December 29, 1965, 57 y.o.   MRN: [id number]  Returning Joshua Lee's call/voicemail stating he received a letter from the insurance company with information on when his Ozempic appeal case would be heard.  I was able to make contact, but he was not currently home where the letter was to provide information off of it.  We already have a scheduled telephone visit tomorrow, so I stated to make sure he has the letter near by to provide the information to me at that time.  Lenna Gilford, PharmD, DPLA

## 2022-07-08 DIAGNOSIS — M5126 Other intervertebral disc displacement, lumbar region: Secondary | ICD-10-CM | POA: Diagnosis not present

## 2022-07-08 DIAGNOSIS — M48061 Spinal stenosis, lumbar region without neurogenic claudication: Secondary | ICD-10-CM | POA: Diagnosis not present

## 2022-07-08 DIAGNOSIS — M5136 Other intervertebral disc degeneration, lumbar region: Secondary | ICD-10-CM | POA: Diagnosis not present

## 2022-07-08 DIAGNOSIS — Z79899 Other long term (current) drug therapy: Secondary | ICD-10-CM | POA: Diagnosis not present

## 2022-07-08 DIAGNOSIS — M545 Low back pain, unspecified: Secondary | ICD-10-CM | POA: Diagnosis not present

## 2022-07-08 DIAGNOSIS — M25561 Pain in right knee: Secondary | ICD-10-CM | POA: Diagnosis not present

## 2022-07-08 DIAGNOSIS — M791 Myalgia, unspecified site: Secondary | ICD-10-CM | POA: Diagnosis not present

## 2022-07-08 DIAGNOSIS — M5416 Radiculopathy, lumbar region: Secondary | ICD-10-CM | POA: Diagnosis not present

## 2022-07-08 DIAGNOSIS — M7061 Trochanteric bursitis, right hip: Secondary | ICD-10-CM | POA: Diagnosis not present

## 2022-07-09 ENCOUNTER — Other Ambulatory Visit: Payer: 59

## 2022-07-09 ENCOUNTER — Telehealth: Payer: Self-pay | Admitting: Family Medicine

## 2022-07-09 NOTE — Progress Notes (Signed)
   07/09/2022  Patient ID: Joshua Lee, male   DOB: 03/11/1965, 57 y.o.   MRN: [id number]  Patient outreach to discuss letter [name] received from [employer]ia Pouch about the Ozempic appeal.  His case will be heard on Tuesday 6/25 at 9:30am, and patient, provider/representative may join call.  I contacted the number provided on the letter to inform [name] I plan to join call on patients behalf Tuesday morning.  [name] also states that [facility] Truclity on order that I had them process as a backup in case the Ozempic appeal is not approved.  Informing Dr. Laural Benes of the above and providing call information in case she would like to join.  If appeal denied, we can see if other pharmacies have Trulicity 3mg  in stock  Lenna Gilford, PharmD, DPLA

## 2022-07-09 NOTE — Telephone Encounter (Unsigned)
Copied from CRM [id number]. Topic: General - Other >> Jul 09, 2022  1:37 PM Ja-Kwan M wrote: Reason for CRM: Pt called to see if Dr. Laural Benes received a letter regarding an appeal for his medication and he wants to know if Dr. Laural Benes received the letter. Pt requests call back to discuss. Cb# [phone]

## 2022-07-12 ENCOUNTER — Telehealth: Payer: Self-pay

## 2022-07-12 NOTE — Progress Notes (Unsigned)
   07/12/2022  Patient ID: Joshua Lee, male   DOB: March 05, 1965, 57 y.o.   MRN: [id number]  Returning call/voicemail from Joshua Lee about scheduled appointment tomorrow.  I will be calling into his Ozempic appeal case hearing at 9:30am and will contact him later that day to inform of insurance coverage decision.  Joshua Lee has an eye doctor visit at 10:30am, so I will call after lunchtime to discuss.  Lenna Gilford, PharmD, DPLA

## 2022-07-13 ENCOUNTER — Other Ambulatory Visit: Payer: 59

## 2022-07-13 LAB — HM DIABETES EYE EXAM

## 2022-07-13 NOTE — Progress Notes (Signed)
   07/13/2022  Patient ID: Joshua Lee, male   DOB: 12/02/1965, 57 y.o.   MRN: [id number]  Joined Joshua Lee's Ozempic 1mg  appeal hearing by phone at 930am EST.  Presented clinical information supporting Ozempic therapy versus Trulicity and Victoza covered by plan formulary.  Liraglutide has been shown to have less A1c reduction when compared to semagluttide, and I have adherence concerns with a daily injection versus weekly in this patient.  He has been successful with Trulicity therapy; however, the dose he is on is currently not available due to a Sport and exercise psychologist.    A decision will be made by the panel today, and a letter mailed to patient/provider.  Decision information can also be obtained by calling the customer service number for his plan as soon as tomorrow morning.  I will call patient to inform him of this information today and contact insurance in the morning for outcome information.

## 2022-07-14 ENCOUNTER — Telehealth: Payer: Self-pay

## 2022-07-15 ENCOUNTER — Telehealth: Payer: Self-pay

## 2022-07-15 ENCOUNTER — Other Ambulatory Visit (HOSPITAL_COMMUNITY): Payer: Self-pay

## 2022-07-15 MED ORDER — TRULICITY 4.5 MG/0.5ML ~~LOC~~ SOAJ
4.5000 mg | SUBCUTANEOUS | 1 refills | Status: DC
  Filled 2022-07-15 (×2): qty 2, 28d supply, fill #0

## 2022-07-15 NOTE — Progress Notes (Unsigned)
   07/15/2022  Patient ID: [name], male   DOB: 14-Apr-1965, 57 y.o.   MRN: [id number]  Patient outreach to discuss diabetes medication management  - Ozempic denial was upheld per appeal case hearing Tuesday, and insurance will only cover Trulicity or Victoza - Patient has exhausted copay coupon funds available from the [employer] for the year; and has >$5000 remaining toward his deductible.  Therefore, Trulicity and Victoza both would have been >$700/month.  Victoza does not have a copay card to bill secondary to insurance. - Contacted patient to inform him and discuss other options - Recommended he look into applying for [place] Medicaid since the recent program expansion, but he does not wish to do so - Discussed Januvia, which he has taken in the past before starting Ozempic in 2021.  He is agreeable to starting Januvia 100mg  daily.  Will consult with Dr. Laural Benes and obtain copay card to provide to pharmacy if she agrees with plan.  Lenna Gilford, PharmD, DPLA

## 2022-07-15 NOTE — Progress Notes (Signed)
   07/15/2022  Patient ID: Joshua Lee, male   DOB: [birth year], 57 y.o.   MRN: [id number]  Patient outreach to inform [name] coverage denial for Ozempic was upheld after appeal case hearing on Tuesday  S/O: -[facility] to see if [facility] have been able to get Trulicity 3mg  in.  They have not, nor could they see any locations that have it in stock.  They also do not have Victoza in stock. -Contacted [facility] and they have 4.5mg  Trulicity and Victoza in stock  A/P: -Patient was tolerating 3mg  of Trulicity and 1mg  of Ozempic well, so Trulicity 4.5mg  weekly would be appropriate -Prescription pending for Dr. Laural Benes to sign -Obtained copay card and included information on the prescription for pharmacy to bill secondary to insurance  Follow-up:  Once order is signed, I will contact pharmacy to check on pricing.  If not affordable, I will see if they can do a test claim for Victoza.  Lenna Gilford, PharmD, DPLA

## 2022-07-15 NOTE — Telephone Encounter (Signed)
All set [employer]

## 2022-07-16 ENCOUNTER — Telehealth: Payer: Self-pay | Admitting: Family Medicine

## 2022-07-16 MED ORDER — SITAGLIPTIN PHOSPHATE 100 MG PO TABS
100.0000 mg | ORAL_TABLET | Freq: Every day | ORAL | 11 refills | Status: DC
Start: 2022-07-16 — End: 2023-01-11

## 2022-07-16 NOTE — Telephone Encounter (Signed)
Called and notified patient to come pick up sample.

## 2022-07-16 NOTE — Telephone Encounter (Signed)
Pt is calling in requesting the status of his medication. Pt says he was taking Ozempic but due to insurance he can no longer take that and wants to know if there are any samples in the office he can have because he is supposed to take a shot today. Pt says the office was working to find him a more affordable medication to replace the Ozempic. Please advise.

## 2022-07-16 NOTE — Telephone Encounter (Signed)
OK to give sample. Will sign out.

## 2022-07-19 ENCOUNTER — Telehealth: Payer: Self-pay

## 2022-07-19 NOTE — Progress Notes (Signed)
   07/19/2022  Patient ID: Joshua Lee, male   DOB: 07-08-1965, 57 y.o.   MRN: [id number]  [facility], and [name] is going through on insurance + copay card for $5 and is ready for pickup.  Contacted patient to inform and verify he picked up Ozempic sample from Dr. Henriette Combs office Friday.  Educated patient to start Januvia 100mg  daily once he has used the last of this Ozempic sample.  Scheduled a telephone follow-up visit for 9/1 to see how he is tolerating Januvia and what home BG readings are.  Lenna Gilford, PharmD, DPLA

## 2022-07-20 ENCOUNTER — Ambulatory Visit: Payer: Self-pay | Admitting: *Deleted

## 2022-07-20 NOTE — Patient Outreach (Signed)
  Care Coordination   Follow Up Visit Note   07/22/2022 Name: Joshua Lee MRN: [id number] DOB: 1965/06/16  Joshua Lee is a 57 y.o. year old male who sees [name], DO for primary care. I spoke with  Joshua Lee by phone today.  What matters to the patients health and wellness today?  Bring A1C back down    Goals Addressed             This Visit's Progress    RNCM: Effective Management of DM       Care Coordination Interventions:  Lab Results  Component Value Date   HGBA1C 7.3 (H) 07/21/2022    Provided education to patient about basic DM disease process Reviewed medications with patient and discussed importance of medication adherence Counseled on importance of regular laboratory monitoring as prescribed Discussed plans with patient for ongoing care management follow up and provided patient with direct contact information for care management team Advised patient, providing education and rationale, to check cbg as directed and record, calling pcp for findings outside established parameters Review of patient status, including review of consultants reports, relevant laboratory and other test results, and medications completed        SDOH assessments and interventions completed:  No     Care Coordination Interventions:  Yes, provided   Interventions Today    Flowsheet Row Most Recent Value  Chronic Disease   Chronic disease during today's visit Diabetes  General Interventions   General Interventions Discussed/Reviewed Doctor Visits, General Interventions Reviewed, Labs, Annual Foot Exam, Annual Eye Exam  Labs Hgb A1c every 3 months  [A1C increased to 7.3]  Doctor Visits Discussed/Reviewed Doctor Visits Reviewed  [facility] tomorrow, echo 7/9, cardiology 7/16, pharmacy team 9/3]  Education Interventions   Education Provided Provided Education  Provided Verbal Education On Medication, Blood Sugar Monitoring  [report blood sugars range 84-120.  Has Ozempic  samples he will continue to take for 3 weeks, then will change to Januvia]       Follow up plan: Follow up call scheduled for 9/6    Encounter Outcome:  Pt. Visit Completed   Kemper Durie, RN, MSN, [facility] Care Management Care Management Coordinator [phone]

## 2022-07-21 ENCOUNTER — Other Ambulatory Visit: Payer: Self-pay | Admitting: Family Medicine

## 2022-07-21 ENCOUNTER — Encounter: Payer: Self-pay | Admitting: Family Medicine

## 2022-07-21 ENCOUNTER — Ambulatory Visit: Payer: 59 | Admitting: Family Medicine

## 2022-07-21 VITALS — BP 117/79 | HR 73 | Temp 98.0°F | Wt 239.8 lb

## 2022-07-21 DIAGNOSIS — E1122 Type 2 diabetes mellitus with diabetic chronic kidney disease: Secondary | ICD-10-CM

## 2022-07-21 DIAGNOSIS — Z7984 Long term (current) use of oral hypoglycemic drugs: Secondary | ICD-10-CM | POA: Diagnosis not present

## 2022-07-21 DIAGNOSIS — N183 Chronic kidney disease, stage 3 unspecified: Secondary | ICD-10-CM | POA: Diagnosis not present

## 2022-07-21 LAB — BAYER DCA HB A1C WAIVED: HB A1C (BAYER DCA - WAIVED): 7.3 % — ABNORMAL HIGH (ref 4.8–5.6)

## 2022-07-21 NOTE — Progress Notes (Signed)
BP 117/79   Pulse 73   Temp 98 F (36.7 C) (Oral)   Wt 239 lb 12.8 oz (108.8 kg)   SpO2 96%   BMI 35.41 kg/m    Subjective:    Patient ID: Joshua Lee, male    DOB: Nov 05, 1965, 57 y.o.   MRN: [id number]  HPI: Joshua Lee is a 57 y.o. male  Chief Complaint  Patient presents with   Diabetes   DIABETES Hypoglycemic episodes:no Polydipsia/polyuria: no Visual disturbance: no Chest pain: no Paresthesias: no Glucose Monitoring: yes  Accucheck frequency: occasionally Taking Insulin?: no Blood Pressure Monitoring: not checking Retinal Examination: Up to Date Foot Exam: Up to Date Diabetic Education: Completed Pneumovax: Up to Date Influenza: Up to Date Aspirin: yes  Relevant past medical, surgical, family and social history reviewed and updated as indicated. Interim medical history since our last visit reviewed. Allergies and medications reviewed and updated.  Review of Systems  Constitutional: Negative.   Respiratory: Negative.    Cardiovascular: Negative.   Gastrointestinal: Negative.   Musculoskeletal: Negative.   Neurological: Negative.   Psychiatric/Behavioral: Negative.      Per HPI unless specifically indicated above     Objective:    BP 117/79   Pulse 73   Temp 98 F (36.7 C) (Oral)   Wt 239 lb 12.8 oz (108.8 kg)   SpO2 96%   BMI 35.41 kg/m   Wt Readings from Last 3 Encounters:  07/21/22 239 lb 12.8 oz (108.8 kg)  07/02/22 237 lb (107.5 kg)  04/22/22 245 lb 4.8 oz (111.3 kg)    Physical Exam Vitals and nursing note reviewed.  Constitutional:      General: He is not in acute distress.    Appearance: Normal appearance. He is not ill-appearing, toxic-appearing or diaphoretic.  HENT:     Head: Normocephalic and atraumatic.     Right Ear: External ear normal.     Left Ear: External ear normal.     Nose: Nose normal.     Mouth/Throat:     Mouth: Mucous membranes are moist.     Pharynx: Oropharynx is clear.  Eyes:     General: No  scleral icterus.       Right eye: No discharge.        Left eye: No discharge.     Extraocular Movements: Extraocular movements intact.     Conjunctiva/sclera: Conjunctivae normal.     Pupils: Pupils are equal, round, and reactive to light.  Cardiovascular:     Rate and Rhythm: Normal rate and regular rhythm.     Pulses: Normal pulses.     Heart sounds: Normal heart sounds. No murmur heard.    No friction rub. No gallop.  Pulmonary:     Effort: Pulmonary effort is normal. No respiratory distress.     Breath sounds: Normal breath sounds. No stridor. No wheezing, rhonchi or rales.  Chest:     Chest wall: No tenderness.  Musculoskeletal:        General: Normal range of motion.     Cervical back: Normal range of motion and neck supple.  Skin:    General: Skin is warm and dry.     Capillary Refill: Capillary refill takes less than 2 seconds.     Coloration: Skin is not jaundiced or pale.     Findings: No bruising, erythema, lesion or rash.  Neurological:     General: No focal deficit present.     Mental Status: He is alert  and oriented to person, place, and time. Mental status is at baseline.  Psychiatric:        Mood and Affect: Mood normal.        Behavior: Behavior normal.        Thought Content: Thought content normal.        Judgment: Judgment normal.     Results for orders placed or performed in visit on 04/22/22  Bayer DCA Hb A1c Waived  Result Value Ref Range   HB A1C (BAYER DCA - WAIVED) 7.1 (H) 4.8 - 5.6 %  Comprehensive metabolic panel  Result Value Ref Range   Glucose 143 (H) 70 - 99 mg/dL   BUN 13 6 - 24 mg/dL   Creatinine, Ser 9.81 (L) 0.76 - 1.27 mg/dL   eGFR 191 >47 WG/NFA/2.13   BUN/Creatinine Ratio 19 9 - 20   Sodium 138 134 - 144 mmol/L   Potassium 4.1 3.5 - 5.2 mmol/L   Chloride 97 96 - 106 mmol/L   CO2 22 20 - 29 mmol/L   Calcium 9.6 8.7 - 10.2 mg/dL   Total Protein 7.0 6.0 - 8.5 g/dL   Albumin 4.4 3.8 - 4.9 g/dL   Globulin, Total 2.6 1.5 - 4.5  g/dL   Albumin/Globulin Ratio 1.7 1.2 - 2.2   Bilirubin Total 0.5 0.0 - 1.2 mg/dL   Alkaline Phosphatase 54 44 - 121 IU/L   AST 17 0 - 40 IU/L   ALT 21 0 - 44 IU/L  CBC with Differential/Platelet  Result Value Ref Range   WBC 9.1 3.4 - 10.8 x10E3/uL   RBC 5.31 4.14 - 5.80 x10E6/uL   Hemoglobin 14.5 13.0 - 17.7 g/dL   Hematocrit 08.6 57.8 - 51.0 %   MCV 85 79 - 97 fL   MCH 27.3 26.6 - 33.0 pg   MCHC 32.2 31.5 - 35.7 g/dL   RDW 46.9 62.9 - 52.8 %   Platelets 243 150 - 450 x10E3/uL   Neutrophils 75 Not Estab. %   Lymphs 17 Not Estab. %   Monocytes 7 Not Estab. %   Eos 1 Not Estab. %   Basos 0 Not Estab. %   Neutrophils Absolute 6.7 1.4 - 7.0 x10E3/uL   Lymphocytes Absolute 1.5 0.7 - 3.1 x10E3/uL   Monocytes Absolute 0.6 0.1 - 0.9 x10E3/uL   EOS (ABSOLUTE) 0.1 0.0 - 0.4 x10E3/uL   Basophils Absolute 0.0 0.0 - 0.2 x10E3/uL   Immature Granulocytes 0 Not Estab. %   Immature Grans (Abs) 0.0 0.0 - 0.1 x10E3/uL  Lipid Panel w/o Chol/HDL Ratio  Result Value Ref Range   Cholesterol, Total 110 100 - 199 mg/dL   Triglycerides 413 (H) 0 - 149 mg/dL   HDL 31 (L) >24 mg/dL   VLDL Cholesterol Cal 28 5 - 40 mg/dL   LDL Chol Calc (NIH) 51 0 - 99 mg/dL  Cologuard  Result Value Ref Range   COLOGUARD Negative Negative      Assessment & Plan:   Problem List Items Addressed This Visit       Endocrine   Type 2 diabetes mellitus with stage 3 chronic kidney disease, without long-term current use of insulin (HCC) - Primary    Doing well with A1c of 7.1. To change to [place] in 3 weeks when he finishes his ozempic. Continue to monitor. Call with any concerns. Recheck 3 months.       Relevant Orders   Basic metabolic panel   Bayer DCA Hb M0N Waived  Follow up plan: Return in about 3 months (around 10/21/2022).

## 2022-07-21 NOTE — Assessment & Plan Note (Signed)
Doing well with A1c of 7.1. To change to [place] in 3 weeks when he finishes his ozempic. Continue to monitor. Call with any concerns. Recheck 3 months.

## 2022-07-21 NOTE — Telephone Encounter (Signed)
Requested Prescriptions  Pending Prescriptions Disp Refills   amLODipine (NORVASC) 2.5 MG tablet [Pharmacy Med Name: AMLODIPINE BESYLATE 2.5 MG TAB] 90 tablet 0    Sig: TAKE 1 TABLET BY MOUTH EVERY DAY     Cardiovascular: Calcium Channel Blockers 2 Passed - 07/21/2022  2:29 AM      Passed - Last BP in normal range    BP Readings from Last 1 Encounters:  07/21/22 117/79         Passed - Last Heart Rate in normal range    Pulse Readings from Last 1 Encounters:  07/21/22 73         Passed - Valid encounter within last 6 months    Recent Outpatient Visits           Today Type 2 diabetes mellitus with stage 3 chronic kidney disease, without long-term current use of insulin, unspecified whether stage 3a or 3b CKD (HCC)   [place] [facility] [place], [name], DO   3 months ago CKD (chronic kidney disease), stage II   [facility] [place], [name], DO   6 months ago Primary hypertension   [place] [facility] [place], [name], DO   6 months ago Right foot pain   [place] [facility] [place], [name], DO   7 months ago Type 2 diabetes mellitus with stage 3 chronic kidney disease, without long-term current use of insulin, unspecified whether stage 3a or 3b CKD [facility])   [place] [facility] [place], Oralia Rud, DO       Future Appointments             In 1 week [name], MD [facility]   In 3 months Laural Benes, Oralia Rud, DO [place] [employer] [facility]             icosapent Ethyl (VASCEPA) 1 g capsule [Pharmacy Med Name: ICOSAPENT ETHYL 1 GRAM CAPSULE] 180 capsule 0    Sig: TAKE 2 CAPSULES BY MOUTH 2 TIMES DAILY.     Off-Protocol Failed - 07/21/2022  2:29 AM      Failed - Medication not assigned to a protocol, review manually.      Passed - Valid encounter within last 12 months    Recent Outpatient Visits           Today Type 2 diabetes mellitus with stage 3  chronic kidney disease, without long-term current use of insulin, unspecified whether stage 3a or 3b CKD (HCC)   [place] [facility], [name], DO   3 months ago CKD (chronic kidney disease), stage II   [place] [facility] [place], [name], DO   6 months ago Primary hypertension   [place] [place], [name], DO   6 months ago Right foot pain   [place] [facility] [place], [name], DO   7 months ago Type 2 diabetes mellitus with stage 3 chronic kidney disease, without long-term current use of insulin, unspecified whether stage 3a or 3b CKD [facility])   [facility] [facility] [name], DO       Future Appointments             In 1 week [name], MD [facility]   In 3 months Laural Benes, Oralia Rud, DO  [facility], [facility]

## 2022-07-22 ENCOUNTER — Encounter: Payer: Self-pay | Admitting: Family Medicine

## 2022-07-22 LAB — BASIC METABOLIC PANEL
BUN/Creatinine Ratio: 16 (ref 9–20)
BUN: 13 mg/dL (ref 6–24)
CO2: 23 mmol/L (ref 20–29)
Calcium: 9.3 mg/dL (ref 8.7–10.2)
Chloride: 99 mmol/L (ref 96–106)
Creatinine, Ser: 0.82 mg/dL (ref 0.76–1.27)
Glucose: 172 mg/dL — ABNORMAL HIGH (ref 70–99)
Potassium: 4.3 mmol/L (ref 3.5–5.2)
Sodium: 140 mmol/L (ref 134–144)
eGFR: 103 mL/min/{1.73_m2} (ref >59)

## 2022-07-24 ENCOUNTER — Other Ambulatory Visit: Payer: Self-pay | Admitting: Family Medicine

## 2022-07-26 NOTE — Telephone Encounter (Signed)
Requested Prescriptions  Pending Prescriptions Disp Refills   JARDIANCE 25 MG TABS tablet [Pharmacy Med Name: JARDIANCE 25 MG TABLET] 30 tablet 5    Sig: TAKE 1 TABLET BY MOUTH DAILY BEFORE BREAKFAST.     Endocrinology:  Diabetes - SGLT2 Inhibitors Passed - 07/24/2022  9:31 AM      Passed - Cr in normal range and within 360 days    Creatinine, Ser  Date Value Ref Range Status  07/21/2022 0.82 0.76 - 1.27 mg/dL Final         Passed - HBA1C is between 0 and 7.9 and within 180 days    Hemoglobin A1C  Date Value Ref Range Status  09/04/2015 6.9  Final  09/04/2015 6.9  Final   HB A1C (BAYER DCA - WAIVED)  Date Value Ref Range Status  07/21/2022 7.3 (H) 4.8 - 5.6 % Final    Comment:             Prediabetes: 5.7 - 6.4          Diabetes: >6.4          Glycemic control for adults with diabetes: <7.0          Passed - eGFR in normal range and within 360 days    GFR calc Af Amer  Date Value Ref Range Status  02/28/2020 114 >59 mL/min/1.73 Final    Comment:    **In accordance with recommendations from the NKF-ASN Task force,**   [facility] is in the process of updating its eGFR calculation to the   2021 CKD-EPI creatinine equation that estimates kidney function   without a race variable.    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 chronic kidney disease, without long-term current use of insulin, unspecified whether stage 3a or 3b CKD (HCC)   [facility] [place], [name], DO   3 months ago CKD (chronic kidney disease), stage II   [place] [facility] [place], [name], DO   6 months ago Primary hypertension   [facility] [place], [name], DO   6 months ago  Right foot pain   [facility] [place], [name], DO   7 months ago Type 2 diabetes mellitus with stage 3 chronic kidney disease, without long-term current use of insulin, unspecified whether stage 3a or 3b CKD [facility])   [place] [facility] [name], DO       Future Appointments             In 1 week [name], MD [facility]   In 2 months Laural Benes, Oralia Rud, DO [place] [facility]

## 2022-07-27 ENCOUNTER — Ambulatory Visit (INDEPENDENT_AMBULATORY_CARE_PROVIDER_SITE_OTHER): Payer: 59

## 2022-07-27 DIAGNOSIS — I340 Nonrheumatic mitral (valve) insufficiency: Secondary | ICD-10-CM | POA: Diagnosis not present

## 2022-07-27 DIAGNOSIS — I10 Essential (primary) hypertension: Secondary | ICD-10-CM

## 2022-07-27 DIAGNOSIS — I2585 Chronic coronary microvascular dysfunction: Secondary | ICD-10-CM

## 2022-07-27 DIAGNOSIS — E1159 Type 2 diabetes mellitus with other circulatory complications: Secondary | ICD-10-CM

## 2022-07-27 DIAGNOSIS — E782 Mixed hyperlipidemia: Secondary | ICD-10-CM

## 2022-07-27 DIAGNOSIS — N182 Chronic kidney disease, stage 2 (mild): Secondary | ICD-10-CM

## 2022-08-03 ENCOUNTER — Ambulatory Visit (INDEPENDENT_AMBULATORY_CARE_PROVIDER_SITE_OTHER): Payer: 59 | Admitting: Cardiovascular Disease

## 2022-08-03 ENCOUNTER — Encounter: Payer: Self-pay | Admitting: Cardiovascular Disease

## 2022-08-03 VITALS — BP 108/70 | HR 88 | Ht 69.0 in | Wt 240.0 lb

## 2022-08-03 DIAGNOSIS — E1169 Type 2 diabetes mellitus with other specified complication: Secondary | ICD-10-CM

## 2022-08-03 DIAGNOSIS — N182 Chronic kidney disease, stage 2 (mild): Secondary | ICD-10-CM | POA: Diagnosis not present

## 2022-08-03 DIAGNOSIS — E669 Obesity, unspecified: Secondary | ICD-10-CM

## 2022-08-03 DIAGNOSIS — E1159 Type 2 diabetes mellitus with other circulatory complications: Secondary | ICD-10-CM

## 2022-08-03 DIAGNOSIS — E1122 Type 2 diabetes mellitus with diabetic chronic kidney disease: Secondary | ICD-10-CM | POA: Diagnosis not present

## 2022-08-03 DIAGNOSIS — M9905 Segmental and somatic dysfunction of pelvic region: Secondary | ICD-10-CM | POA: Diagnosis not present

## 2022-08-03 DIAGNOSIS — M9903 Segmental and somatic dysfunction of lumbar region: Secondary | ICD-10-CM | POA: Diagnosis not present

## 2022-08-03 DIAGNOSIS — E782 Mixed hyperlipidemia: Secondary | ICD-10-CM | POA: Diagnosis not present

## 2022-08-03 DIAGNOSIS — N183 Chronic kidney disease, stage 3 unspecified: Secondary | ICD-10-CM

## 2022-08-03 DIAGNOSIS — M5136 Other intervertebral disc degeneration, lumbar region: Secondary | ICD-10-CM | POA: Diagnosis not present

## 2022-08-03 DIAGNOSIS — M5416 Radiculopathy, lumbar region: Secondary | ICD-10-CM | POA: Diagnosis not present

## 2022-08-03 DIAGNOSIS — I10 Essential (primary) hypertension: Secondary | ICD-10-CM | POA: Diagnosis not present

## 2022-08-03 DIAGNOSIS — I152 Hypertension secondary to endocrine disorders: Secondary | ICD-10-CM

## 2022-08-03 DIAGNOSIS — I340 Nonrheumatic mitral (valve) insufficiency: Secondary | ICD-10-CM | POA: Diagnosis not present

## 2022-08-03 NOTE — Progress Notes (Signed)
Cardiology Office Note   Date:  08/03/2022   ID:  Joshua Lee, DOB Mar 13, 1965, MRN [id number]  PCP:  [name], DO  Cardiologist:  Adrian Blackwater, MD      History of Present Illness: Joshua Lee is a 57 y.o. 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     amLODipine (NORVASC) 2.5 MG tablet TAKE 1 TABLET BY MOUTH EVERY DAY 90 tablet 0   aspirin 81 MG tablet Take 81 mg by mouth daily.     atorvastatin (LIPITOR) 80 MG tablet Take 1 tablet (80 mg total) by mouth at bedtime. 90 tablet 1   benazepril (LOTENSIN) 40 MG tablet Take 1 tablet (40 mg total) by mouth daily. 90 tablet 1   Coenzyme Q-10 100 MG capsule Take 100 mg by mouth daily.     cyclobenzaprine (FLEXERIL) 10 MG tablet Take 1 tablet (10 mg total) by mouth at bedtime. 30 tablet 1   diclofenac Sodium (VOLTAREN) 1 % GEL Apply 4 g topically 4 (four) times daily. 350 g 2   fluticasone (FLONASE) 50 MCG/ACT nasal spray Place 1 spray into both nostrils 2 (two) times daily. 16 g 12   gabapentin (NEURONTIN) 300 MG capsule Take 300 mg by mouth 3 (three) times daily as needed.     Garlic 1000 MG CAPS Take 1,000 mg by  mouth daily.     glucose blood (CONTOUR NEXT TEST) test strip 1 each by Other route as needed for other. 100 strip 12   icosapent Ethyl (VASCEPA) 1 g capsule TAKE 2 CAPSULES BY MOUTH 2 TIMES DAILY. 180 capsule 0   JARDIANCE 25 MG TABS tablet TAKE 1 TABLET BY MOUTH DAILY BEFORE BREAKFAST. 30 tablet 5   metFORMIN (GLUCOPHAGE) 500 MG tablet TAKE 2 TABLETS(1000 MG) BY MOUTH TWICE DAILY WITH A MEAL 360 tablet 1   metoprolol tartrate (LOPRESSOR) 100 MG tablet Take 1 tablet (100 mg total) by mouth 2 (two) times daily. 180 tablet 1   Potassium Citrate 15 MEQ (1620 MG) TBCR Take 15 mEq by mouth 2 (two) times daily. 2 tab QAM, 1 tab QPM  1   saline (AYR) GEL Place 1 Application into both nostrils every 6 (six) hours as needed. 14.1 g 6   sitaGLIPtin (JANUVIA) 100 MG tablet Take 1 tablet (100 mg total) by mouth daily. [place]:  [id number], PCN:  [employer], ID:  [id number], Grp:  [id number] 30 tablet 11   tamsulosin (FLOMAX) 0.4 MG CAPS capsule TAKE 1 CAPSULE (0.4 MG) BY MOUTH DAILY 90 capsule 1  traMADol (ULTRAM) 50 MG tablet Take 50 mg by mouth every 12 (twelve) hours as needed.     No current facility-administered medications for this visit.    Allergies:   Penicillins    Social History:   reports that he has never smoked. His smokeless tobacco use includes snuff. He reports that he does not currently use alcohol. He reports that he does not use drugs.   Family History:  family history includes Diabetes in his maternal uncle.    ROS:     Review of Systems  Constitutional: Negative.   HENT: Negative.    Eyes: Negative.   Respiratory: Negative.    Gastrointestinal: Negative.   Genitourinary: Negative.   Musculoskeletal: Negative.   Skin: Negative.   Neurological: Negative.   Endo/Heme/Allergies: Negative.   Psychiatric/Behavioral: Negative.    All other systems reviewed and are negative.     All other systems are reviewed and negative.    PHYSICAL EXAM: VS:  BP 108/70   Pulse 88    Ht 5\' 9"  (1.753 m)   Wt 240 lb (108.9 kg)   SpO2 100%   BMI 35.44 kg/m  , BMI Body mass index is 35.44 kg/m. Last weight:  Wt Readings from Last 3 Encounters:  08/03/22 240 lb (108.9 kg)  07/21/22 239 lb 12.8 oz (108.8 kg)  07/02/22 237 lb (107.5 kg)     Physical Exam Vitals reviewed.  Constitutional:      Appearance: Normal appearance. He is normal weight.  HENT:     Head: Normocephalic.     Nose: Nose normal.     Mouth/Throat:     Mouth: Mucous membranes are moist.  Eyes:     Pupils: Pupils are equal, round, and reactive to light.  Cardiovascular:     Rate and Rhythm: Normal rate and regular rhythm.     Pulses: Normal pulses.     Heart sounds: Normal heart sounds.  Pulmonary:     Effort: Pulmonary effort is normal.  Abdominal:     General: Abdomen is flat. Bowel sounds are normal.  Musculoskeletal:        General: Normal range of motion.     Cervical back: Normal range of motion.  Skin:    General: Skin is warm.  Neurological:     General: No focal deficit present.     Mental Status: He is alert.  Psychiatric:        Mood and Affect: Mood normal.       EKG:   Recent Labs: 09/23/2021: TSH 3.020 04/22/2022: ALT 21; Hemoglobin 14.5; Platelets 243 07/21/2022: BUN 13; Creatinine, Ser 0.82; Potassium 4.3; Sodium 140    Lipid Panel    Component Value Date/Time   CHOL 110 04/22/2022 1130   CHOL 108 08/29/2018 1045   TRIG 164 (H) 04/22/2022 1130   TRIG 221 (H) 08/29/2018 1045   HDL 31 (L) 04/22/2022 1130   CHOLHDL 6.5 (H) 02/09/2017 1002   VLDL 44 (H) 08/29/2018 1045   LDLCALC 51 04/22/2022 1130      Other studies Reviewed: Additional studies/ records that were reviewed today include:  Review of the above records demonstrates:       No data to display            ASSESSMENT AND PLAN:    ICD-10-CM   1. Primary hypertension  I10    LVEF 53 PERCENT ON ECHO    2. Obesity, diabetes, and hypertension syndrome (HCC)  E11.69    E66.9  E11.59     I15.2     3. Type 2 diabetes mellitus with stage 3 chronic kidney disease, without long-term current use of insulin, unspecified whether stage 3a or 3b CKD (HCC)  E11.22    N18.30     4. CKD (chronic kidney disease), stage II  N18.2     5. Mixed hyperlipidemia  E78.2     6. Morbid obesity (HCC)  E66.01     7. Nonrheumatic mitral valve regurgitation  I34.0    MILD MR ON ECHO       Problem List Items Addressed This Visit       Cardiovascular and Mediastinum   Hypertension - Primary   Obesity, diabetes, and hypertension syndrome (HCC)     Endocrine   Type 2 diabetes mellitus with stage 3 chronic kidney disease, without long-term current use of insulin (HCC)     Genitourinary   CKD (chronic kidney disease), stage II     Other   Hyperlipidemia   Morbid obesity (HCC)   Other Visit Diagnoses     Nonrheumatic mitral valve regurgitation       MILD MR ON ECHO          Disposition:   Return in about 6 months (around 02/03/2023).    Total time spent: 30 minutes  Signed,  Adrian Blackwater, MD  08/03/2022 10:25 AM    [facility]

## 2022-08-04 DIAGNOSIS — E119 Type 2 diabetes mellitus without complications: Secondary | ICD-10-CM | POA: Diagnosis not present

## 2022-08-04 DIAGNOSIS — M25561 Pain in right knee: Secondary | ICD-10-CM | POA: Diagnosis not present

## 2022-08-04 DIAGNOSIS — M7061 Trochanteric bursitis, right hip: Secondary | ICD-10-CM | POA: Diagnosis not present

## 2022-08-10 ENCOUNTER — Ambulatory Visit: Payer: Self-pay

## 2022-08-10 ENCOUNTER — Telehealth: Payer: Self-pay

## 2022-08-10 NOTE — Progress Notes (Signed)
   08/10/2022  Patient ID: [name], male   DOB: [birth year], 57 y.o.   MRN: [id number]  Returning patient call/voicemail from earlier today.  -Mr. Hatlestad questioning when to start taking Januvia 100mg  daily -His last dose of Ozempic was last Friday -These medications should be used together with caution due to increased risk of adverse effects (pancreatitis).  At this point, approximately 1/4 of Ozempic would be present based on medication half-life. -Patient states he went ahead and took his first dose of Januvia this morning; because he was able to get in touch with [facility] prior to my callback, and the nurse/CMA advised to start medication -Unlikely that patient will experience any adverse effects, so I advised him to continue taking Januvia 100mg  daily  Telephone visit follow-up in September to check on efficacy/tolerance of Januvia 100mg .  Lenna Gilford, PharmD, DPLA

## 2022-08-10 NOTE — Telephone Encounter (Signed)
Patient called and stated he took his last sample of ozempic, took last shot* on friday 08/06/2022 & wanted to know when to start new medication Januvia? Please advise, per patient he is eating breakfast and needed to know this information as soon as possible.     Chief Complaint: Has completed Ozempic samples and asking when to start Januvia. Instructed Dr. Laural Benes had instructed him to start after he took the samples. Verbalizes understanding. Symptoms: n/a Frequency: n/a Pertinent Negatives: Patient denies n/a Disposition: [] ED /[] Urgent Care (no appt availability in office) / [] Appointment(In office/virtual)/ []  [place] [facility]/ [x] Home Care/ [] Refused Recommended Disposition /[] [place] [facility]/ []  Follow-up with PCP Additional Notes: Verbalizes understanding. Will call back if he has further questions.  Reason for Disposition  Caller has medicine question only, adult not sick, AND triager answers question  Answer Assessment - Initial Assessment Questions 1. NAME of MEDICINE: "What medicine(s) are you calling about?"     Januvia 2. QUESTION: "What is your question?" (e.g., double dose of medicine, side effect)     When to start 3. PRESCRIBER: "Who prescribed the medicine?" Reason: if prescribed by specialist, call should be referred to that group.     Dr. Laural Benes 4. SYMPTOMS: "Do you have any symptoms?" If Yes, ask: "What symptoms are you having?"  "How bad are the symptoms (e.g., mild, moderate, severe)     N/A 5. PREGNANCY:  "Is there any chance that you are pregnant?" "When was your last menstrual period?"     N/A  Protocols used: Medication Question Call-A-AH

## 2022-08-16 ENCOUNTER — Other Ambulatory Visit: Payer: Self-pay | Admitting: Family Medicine

## 2022-08-16 NOTE — Telephone Encounter (Signed)
Requested medication (s) are due for refill today - no  Requested medication (s) are on the active medication list -yes  Future visit scheduled -yes  Last refill: 07/21/22 #180  Notes to clinic: off protocol- provider review   Requested Prescriptions  Pending Prescriptions Disp Refills   icosapent Ethyl (VASCEPA) 1 g capsule [Pharmacy Med Name: ICOSAPENT ETHYL 1 GRAM CAPSULE] 120 capsule 1    Sig: TAKE 2 CAPSULES BY MOUTH TWICE A DAY     Off-Protocol Failed - 08/16/2022  2:42 AM      Failed - Medication not assigned to a protocol, review manually.      Passed - Valid encounter within last 12 months    Recent Outpatient Visits           3 weeks ago Type 2 diabetes mellitus with stage 3 chronic kidney disease, without long-term current use of insulin, unspecified whether stage 3a or 3b CKD (HCC)   [facility] [place], [name], DO   3 months ago CKD (chronic kidney disease), stage II   [facility] [facility] [place], [name], DO   6 months ago Primary hypertension   [place] [facility] [place], [name], DO   7 months ago Right foot pain   [place] [facility] [place], [name], DO   7 months ago Type 2 diabetes mellitus with stage 3 chronic kidney disease, without long-term current use of insulin, unspecified whether stage 3a or 3b CKD (HCC)   [facility] [name], DO       Future Appointments             In 2 months [name], DO [facility]   In 5 months [name], MD [facility]               Requested Prescriptions  Pending Prescriptions Disp Refills   icosapent Ethyl (VASCEPA) 1 g capsule [Pharmacy Med Name: ICOSAPENT ETHYL 1 GRAM CAPSULE] 120 capsule 1    Sig: TAKE 2 CAPSULES BY MOUTH TWICE A DAY     Off-Protocol Failed - 08/16/2022  2:42 AM      Failed - Medication not assigned to a protocol, review  manually.      Passed - Valid encounter within last 12 months    Recent Outpatient Visits           3 weeks ago Type 2 diabetes mellitus with stage 3 chronic kidney disease, without long-term current use of insulin, unspecified whether stage 3a or 3b CKD (HCC)   [place] [place], [name], DO   3 months ago CKD (chronic kidney disease), stage II   [place] [facility] [place], [name], DO   6 months ago Primary hypertension   [facility] [place], [name], DO   7 months ago Right foot pain   [place] [facility] [place], [name], DO   7 months ago Type 2 diabetes mellitus with stage 3 chronic kidney disease, without long-term current use of insulin, unspecified whether stage 3a or 3b CKD [facility])   [place] [facility] [name], DO       Future Appointments             In 2 months [name], DO [facility], [facility]   In 5 months [place],  Kerman Passey, MD [facility]

## 2022-08-17 DIAGNOSIS — L578 Other skin changes due to chronic exposure to nonionizing radiation: Secondary | ICD-10-CM | POA: Diagnosis not present

## 2022-08-17 DIAGNOSIS — L570 Actinic keratosis: Secondary | ICD-10-CM | POA: Diagnosis not present

## 2022-08-17 DIAGNOSIS — X32XXXA Exposure to sunlight, initial encounter: Secondary | ICD-10-CM | POA: Diagnosis not present

## 2022-08-18 ENCOUNTER — Other Ambulatory Visit: Payer: Self-pay | Admitting: Family Medicine

## 2022-08-18 NOTE — Telephone Encounter (Signed)
Requested Prescriptions  Pending Prescriptions Disp Refills   tamsulosin (FLOMAX) 0.4 MG CAPS capsule [Pharmacy Med Name: TAMSULOSIN HCL 0.4 MG CAPSULE] 30 capsule 5    Sig: TAKE 1 CAPSULE BY MOUTH EVERY DAY     Urology: Alpha-Adrenergic Blocker Passed - 08/18/2022  2:11 AM      Passed - PSA in normal range and within 360 days    Prostate Specific Ag, Serum  Date Value Ref Range Status  09/23/2021 0.7 0.0 - 4.0 ng/mL Final    Comment:    Roche ECLIA methodology. According to the American Urological Association, Serum PSA should decrease and remain at undetectable levels after radical prostatectomy. The AUA defines biochemical recurrence as an initial PSA value 0.2 ng/mL or greater followed by a subsequent confirmatory PSA value 0.2 ng/mL or greater. Values obtained with different assay methods or kits cannot be used interchangeably. Results cannot be interpreted as absolute evidence of the presence or absence of malignant disease.          Passed - Last BP in normal range    BP Readings from Last 1 Encounters:  08/03/22 108/70         Passed - Valid encounter within last 12 months    Recent Outpatient Visits           4 weeks ago Type 2 diabetes mellitus with stage 3 chronic kidney disease, without long-term current use of insulin, unspecified whether stage 3a or 3b CKD (HCC)   [place] [facility] [place], [name], DO   3 months ago CKD (chronic kidney disease), stage II   [place] [facility] [place], [name], DO   7 months ago Primary hypertension   [place] [facility] [place], [name], DO   7 months ago Right foot pain   [place] [place], [name], DO   7 months ago Type 2 diabetes mellitus with stage 3 chronic kidney disease, without long-term current use of insulin, unspecified whether stage 3a or 3b CKD [facility])   [facility] [name], DO       Future  Appointments             In 2 months [name], DO  [facility]   In 5 months [name], MD [facility]

## 2022-08-30 ENCOUNTER — Ambulatory Visit: Payer: 59 | Admitting: Family Medicine

## 2022-08-30 VITALS — BP 113/75 | HR 75 | Temp 97.7°F | Ht 68.5 in | Wt 240.0 lb

## 2022-08-30 DIAGNOSIS — R600 Localized edema: Secondary | ICD-10-CM | POA: Insufficient documentation

## 2022-08-30 MED ORDER — TRIAMCINOLONE ACETONIDE 40 MG/ML IJ SUSP
40.0000 mg | Freq: Once | INTRAMUSCULAR | Status: AC
Start: 2022-08-30 — End: 2022-08-30
  Administered 2022-08-30: 40 mg via INTRAMUSCULAR

## 2022-08-30 MED ORDER — NAPROXEN 500 MG PO TABS: 500.0000 mg | ORAL_TABLET | Freq: Two times a day (BID) | ORAL | 0 refills | Status: DC

## 2022-08-30 NOTE — Assessment & Plan Note (Signed)
Acute, ongoing. Kenalog 40 MG given today along with Naproxen BID for 7 days to help with swelling. Recommend elevation daily and compression sock use. Recommend scheduling appointment with podiatry to consider injections for pain.

## 2022-08-30 NOTE — Patient Instructions (Addendum)
Take Naproxen twice daily, one in the morning and one in the evening (12 hours apart) for 5-7 days  Avoid muscle relaxer while taking   Try compression socks 8 hours daily  Continue elevating legs above heart level  Scheduling with podiatry for foot injections.   Decrease salt and sugar intake

## 2022-08-30 NOTE — Progress Notes (Signed)
BP 113/75   Pulse 75   Temp 97.7 F (36.5 C) (Oral)   Ht 5' 8.5" (1.74 m)   Wt 240 lb (108.9 kg)   SpO2 97%   BMI 35.96 kg/m    Subjective:    Patient ID: Joshua Lee, male    DOB: 04-16-1965, 57 y.o.   MRN: [id number]  HPI: Joshua Lee is a 57 y.o. male  Chief Complaint  Patient presents with   Edema    Bilateral feet swelling and burning with soreness on top that started 4 days ago. He was using ointment on it and admits this helped a little.    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 He complains of the same symptoms today (pain) when he had the xray done. He has tried elevating his legs and this helps, pain is worsened with movement (ache). Feels like it is burning and tingling at the top of his foot Duration: 4 days Location: Bilateral feet at ankles with soreness; varicose veins present History of trauma in area: no Pain: yes Quality: yes Severity: 10/10 Redness: no Fevers: no Nausea/vomiting: no Status: worse Treatments attempted: Voltaren gel  helped.  Tetanus: UTD   Relevant past medical, surgical, family and social history reviewed and updated as indicated. Interim medical history since our last visit reviewed. Allergies and medications reviewed and updated.  Review of Systems  Constitutional:  Negative for fever.  Respiratory: Negative.    Cardiovascular: Negative.   Musculoskeletal:  Positive for joint swelling.  Skin:        Edema of bilateral feet    Per HPI unless specifically indicated above     Objective:    BP 113/75   Pulse 75   Temp 97.7 F (36.5 C) (Oral)   Ht 5' 8.5" (1.74 m)   Wt 240 lb (108.9 kg)   SpO2 97%   BMI 35.96 kg/m   Wt Readings from Last 3 Encounters:  08/30/22 240 lb (108.9 kg)  08/03/22 240 lb (108.9 kg)  07/21/22 239 lb 12.8 oz (108.8 kg)     Physical Exam Vitals and nursing note reviewed.  Constitutional:      General: He is awake. He is not in acute distress.    Appearance: Normal appearance. He is well-developed and well-groomed. He is not ill-appearing.  HENT:     Head: Normocephalic and atraumatic.     Right Ear: Hearing and external ear normal. No drainage.     Left Ear: Hearing and external ear normal. No drainage.     Nose: Nose normal.  Eyes:     General: Lids are normal.        Right eye: No discharge.        Left eye: No discharge.     Conjunctiva/sclera: Conjunctivae normal.  Cardiovascular:     Rate and Rhythm: Normal rate and regular rhythm.     Pulses:          Radial pulses are 2+ on the right side and 2+ on the left side.       Popliteal pulses are 2+ on the right side and 2+ on the left side.       Posterior tibial pulses are 2+ on the right side and 2+ on the left side.     Heart sounds: Normal heart sounds, S1 normal and  S2 normal. No murmur heard.    No gallop.  Pulmonary:     Effort: Pulmonary effort is normal. No accessory muscle usage or respiratory distress.     Breath sounds: Normal breath sounds.  Musculoskeletal:        General: Normal range of motion.     Cervical back: Full passive range of motion without pain and normal range of motion.     Right lower leg: 1+ Pitting Edema present.     Left lower leg: 1+ Pitting Edema present.  Skin:    General: Skin is warm and dry.     Capillary Refill: Capillary refill takes less than 2 seconds.     Coloration: Skin is pale (Bilateral feet).     Comments: Varicose veins in bilateral lower extremity  Neurological:     Mental Status: He is alert and oriented to person, place, and time.  Psychiatric:        Attention and Perception: Attention normal.        Mood and Affect: Mood normal.        Speech: Speech normal.        Behavior: Behavior normal. Behavior is cooperative.        Thought Content: Thought content normal.     Results for  orders placed or performed in visit on 07/21/22  Basic metabolic panel  Result Value Ref Range   Glucose 172 (H) 70 - 99 mg/dL   BUN 13 6 - 24 mg/dL   Creatinine, Ser 5.63 0.76 - 1.27 mg/dL   eGFR 875 >64 PP/IRJ/1.88   BUN/Creatinine Ratio 16 9 - 20   Sodium 140 134 - 144 mmol/L   Potassium 4.3 3.5 - 5.2 mmol/L   Chloride 99 96 - 106 mmol/L   CO2 23 20 - 29 mmol/L   Calcium 9.3 8.7 - 10.2 mg/dL  Bayer DCA Hb C1Y Waived  Result Value Ref Range   HB A1C (BAYER DCA - WAIVED) 7.3 (H) 4.8 - 5.6 %      Assessment & Plan:   Problem List Items Addressed This Visit     Bilateral lower extremity edema - Primary    Acute, ongoing. Kenalog 40 MG given today along with Naproxen BID for 7 days to help with swelling. Recommend elevation daily and compression sock use. Recommend scheduling appointment with podiatry to consider injections for pain.         Follow up plan: Return in about 2 weeks (around 09/13/2022), or if symptoms worsen or fail to improve, for Follow up bilateral lower extremity edema.

## 2022-09-01 DIAGNOSIS — M5416 Radiculopathy, lumbar region: Secondary | ICD-10-CM | POA: Diagnosis not present

## 2022-09-01 DIAGNOSIS — M5136 Other intervertebral disc degeneration, lumbar region: Secondary | ICD-10-CM | POA: Diagnosis not present

## 2022-09-01 DIAGNOSIS — M9903 Segmental and somatic dysfunction of lumbar region: Secondary | ICD-10-CM | POA: Diagnosis not present

## 2022-09-01 DIAGNOSIS — M9905 Segmental and somatic dysfunction of pelvic region: Secondary | ICD-10-CM | POA: Diagnosis not present

## 2022-09-13 ENCOUNTER — Ambulatory Visit (INDEPENDENT_AMBULATORY_CARE_PROVIDER_SITE_OTHER): Payer: 59 | Admitting: Family Medicine

## 2022-09-13 ENCOUNTER — Other Ambulatory Visit: Payer: Self-pay | Admitting: Family Medicine

## 2022-09-13 VITALS — BP 120/75 | HR 70 | Temp 97.9°F | Ht 68.9 in | Wt 242.0 lb

## 2022-09-13 DIAGNOSIS — R600 Localized edema: Secondary | ICD-10-CM

## 2022-09-13 NOTE — Patient Instructions (Signed)
Wear compression socks during the day and take them off at bedtime  Continue elevating legs to prevent swelling  Recommend walking daily as tolerated

## 2022-09-13 NOTE — Progress Notes (Signed)
BP 120/75   Pulse 70   Temp 97.9 F (36.6 C) (Oral)   Ht 5' 8.9" (1.75 m)   Wt 242 lb (109.8 kg)   SpO2 98%   BMI 35.84 kg/m    Subjective:    Patient ID: Joshua Lee, male    DOB: 04/08/1965, 57 y.o.   MRN: [id number]  HPI: Joshua Lee is a 57 y.o. male  Chief Complaint  Patient presents with   Foot Swelling    F/u for previous visit of swelling   FOOT EDEMA  He is taking Naproxen BID for 7 days, elevating legs daily with use of compression socks daily, and Voltaren gel for 2 weeks. He has not reached out to podiatry as now, denies pain. He admits his swelling has decreased and improved since his last visit.  Location: Bilateral feet and ankles History of trauma in area: no Pain: mild tingling Quality: yes Severity: 2/10 Redness: no Fevers: no Nausea/vomiting: no Status:Improved Treatments attempted: Naproxen, Voltaren gel, compression socks, and elevation Tetanus: UTD   Relevant past medical, surgical, family and social history reviewed and updated as indicated. Interim medical history since our last visit reviewed. Allergies and medications reviewed and updated.  Review of Systems  Constitutional:  Negative for fever.  Respiratory: Negative.    Cardiovascular: Negative.   Skin: Negative.     Per HPI unless specifically indicated above     Objective:    BP 120/75   Pulse 70   Temp 97.9 F (36.6 C) (Oral)   Ht 5' 8.9" (1.75 m)   Wt 242 lb (109.8 kg)   SpO2 98%   BMI 35.84 kg/m   Wt Readings from Last 3 Encounters:  09/13/22 242 lb (109.8 kg)  08/30/22 240 lb (108.9 kg)  08/03/22 240 lb (108.9 kg)    Physical Exam Vitals and nursing note reviewed.  Constitutional:      General: He is awake. He is not in acute distress.    Appearance: Normal appearance. He is well-developed and well-groomed. He is obese. He is not ill-appearing.  HENT:     Head: Normocephalic and atraumatic.     Right Ear: Hearing and external ear normal. No drainage.      Left Ear: Hearing and external ear normal. No drainage.     Nose: Nose normal.  Eyes:     General: Lids are normal.        Right eye: No discharge.        Left eye: No discharge.     Conjunctiva/sclera: Conjunctivae normal.  Cardiovascular:     Rate and Rhythm: Normal rate and regular rhythm.     Pulses:          Radial pulses are 2+ on the right side and 2+ on the left side.       Posterior tibial pulses are 2+ on the right side and 2+ on the left side.     Heart sounds: Normal heart sounds, S1 normal and S2 normal. No murmur heard.    No gallop.  Pulmonary:     Effort: Pulmonary effort is normal. No accessory muscle usage or respiratory distress.     Breath sounds: Normal breath sounds.  Musculoskeletal:        General: Normal range of motion.     Cervical back: Full passive range of motion without pain and normal range of motion.     Right lower leg: No edema.     Left lower leg:  No edema.  Skin:    General: Skin is warm and dry.     Capillary Refill: Capillary refill takes less than 2 seconds.     Coloration: Pallor: Bilateral feet.     Comments: Varicose veins in bilateral lower extremity  Neurological:     Mental Status: He is alert and oriented to person, place, and time.  Psychiatric:        Attention and Perception: Attention normal.        Mood and Affect: Mood normal.        Speech: Speech normal.        Behavior: Behavior normal. Behavior is cooperative.        Thought Content: Thought content normal.     Results for orders placed or performed in visit on 07/21/22  Basic metabolic panel  Result Value Ref Range   Glucose 172 (H) 70 - 99 mg/dL   BUN 13 6 - 24 mg/dL   Creatinine, Ser 4.32 0.76 - 1.27 mg/dL   eGFR 761 >47 WL/KHV/7.47   BUN/Creatinine Ratio 16 9 - 20   Sodium 140 134 - 144 mmol/L   Potassium 4.3 3.5 - 5.2 mmol/L   Chloride 99 96 - 106 mmol/L   CO2 23 20 - 29 mmol/L   Calcium 9.3 8.7 - 10.2 mg/dL  Bayer DCA Hb B4Y Waived  Result Value  Ref Range   HB A1C (BAYER DCA - WAIVED) 7.3 (H) 4.8 - 5.6 %      Assessment & Plan:   Problem List Items Addressed This Visit     Bilateral lower extremity edema - Primary    Acute, resolved. Swelling has resolved since use of Naproxen BID for 7 days, elevation, and compression sock use. Recommend continue use of compression socks and elevation daily for prevention. Recommend scheduling appointment with podiatry to consider injections if pain arises. Follow up if swelling returns.           Follow up plan: Return if symptoms worsen or fail to improve.

## 2022-09-13 NOTE — Assessment & Plan Note (Addendum)
Acute, resolved. Swelling has resolved since use of Naproxen BID for 7 days, elevation, and compression sock use. Recommend continue use of compression socks and elevation daily for prevention. Recommend scheduling appointment with podiatry to consider injections if pain arises. Follow up if swelling returns.

## 2022-09-14 NOTE — Telephone Encounter (Signed)
Rx  07/21/22 #90- too soon Requested Prescriptions  Pending Prescriptions Disp Refills   amLODipine (NORVASC) 2.5 MG tablet [Pharmacy Med Name: AMLODIPINE BESYLATE 2.5 MG TAB] 90 tablet 1    Sig: TAKE 1 TABLET BY MOUTH EVERY DAY     Cardiovascular: Calcium Channel Blockers 2 Passed - 09/13/2022 12:02 PM      Passed - Last BP in normal range    BP Readings from Last 1 Encounters:  09/13/22 120/75         Passed - Last Heart Rate in normal range    Pulse Readings from Last 1 Encounters:  09/13/22 70         Passed - Valid encounter within last 6 months    Recent Outpatient Visits           Yesterday Bilateral lower extremity edema   [facility] [place], [name], NP   2 weeks ago Bilateral lower extremity edema   [place] [facility] [place], [name], NP   1 month ago Type 2 diabetes mellitus with stage 3 chronic kidney disease, without long-term current use of insulin, unspecified whether stage 3a or 3b CKD (HCC)   [place] [facility] [place], [name], DO   4 months ago CKD (chronic kidney disease), stage II   [place] [facility] [place], [place], DO   7 months ago Primary hypertension   [place] [facility] [place], [place] Lee, DO       Future Appointments             In 1 month Laural Benes, Oralia Rud, DO  [facility]   In 4 months [name], MD [facility]

## 2022-09-19 ENCOUNTER — Other Ambulatory Visit: Payer: Self-pay | Admitting: Family Medicine

## 2022-09-21 ENCOUNTER — Other Ambulatory Visit: Payer: 59

## 2022-09-21 NOTE — Progress Notes (Signed)
   09/21/2022  Patient ID: Joshua Lee, male   DOB: [birth year], 57 y.o.   MRN: [id number]  S/O Telephone visit to check on diabetes control  Diabetes -Current medications:  Jardiance 25mg  daily, metformin 1000mg  BID, Januvia 100mg  daily -Patient states he is tolerating Januvia well -Recent FBG readings range 84-136 -Patient does not endorse s/sx of hypoglycemia or hyperglycemia -A1c 7/3 was 7.3%  A/P  Diabetes -Currently moderately controlled -Patient sees Dr. Laural Benes again 10/3 and will be due for A1c -If this remains >7%, I recommend additional lifestyle modifications if only slightly over- additional agent may need to be added if >7.5% (likely only affordable, recommended option with current insurance plan would be sulfonylurea)  Follow-up:  None scheduled at this time but can as needed per PCP  Lenna Gilford, PharmD, DPLA

## 2022-09-22 NOTE — Telephone Encounter (Signed)
Requested Prescriptions  Pending Prescriptions Disp Refills   naproxen (NAPROSYN) 500 MG tablet [Pharmacy Med Name: NAPROXEN 500 MG TABLET] 60 tablet 0    Sig: TAKE 1 TABLET BY MOUTH 2 TIMES DAILY WITH A MEAL.     Analgesics:  NSAIDS Failed - 09/19/2022  8:44 AM      Failed - Manual Review: Labs are only required if the patient has taken medication for more than 8 weeks.      Passed - Cr in normal range and within 360 days    Creatinine, Ser  Date Value Ref Range Status  07/21/2022 0.82 0.76 - 1.27 mg/dL Final         Passed - HGB in normal range and within 360 days    Hemoglobin  Date Value Ref Range Status  04/22/2022 14.5 13.0 - 17.7 g/dL Final         Passed - PLT in normal range and within 360 days    Platelets  Date Value Ref Range Status  04/22/2022 243 150 - 450 x10E3/uL Final         Passed - HCT in normal range and within 360 days    Hematocrit  Date Value Ref Range Status  04/22/2022 45.0 37.5 - 51.0 % Final         Passed - eGFR is 30 or above and within 360 days    GFR calc Af Amer  Date Value Ref Range Status  02/28/2020 114 >59 mL/min/1.73 Final    Comment:    **In accordance with recommendations from the NKF-ASN Task force,**   [facility] is in the process of updating its eGFR calculation to the   2021 CKD-EPI creatinine equation that estimates kidney function   without a race variable.    GFR calc non Af Amer  Date Value Ref Range Status  02/28/2020 98 >59 mL/min/1.73 Final   eGFR  Date Value Ref Range Status  07/21/2022 103 >59 mL/min/1.73 Final         Passed - Patient is not pregnant      Passed - Valid encounter within last 12 months    Recent Outpatient Visits           1 week ago Bilateral lower extremity edema   [place] [facility] [place], [name], NP   3 weeks ago Bilateral lower extremity edema   [place] [facility] [place], [name], NP   2 months ago Type 2 diabetes mellitus with  stage 3 chronic kidney disease, without long-term current use of insulin, unspecified whether stage 3a or 3b CKD (HCC)   [place] [facility] [place], [name], DO   5 months ago CKD (chronic kidney disease), stage II   [place] [facility] [place], [place], DO   8 months ago Primary hypertension   [place] [facility] [place], Oralia Rud, DO       Future Appointments             In 4 weeks [name], DO  [place], [facility]   In 4 months [name], MD [facility]

## 2022-09-24 ENCOUNTER — Ambulatory Visit: Payer: Self-pay | Admitting: *Deleted

## 2022-09-24 NOTE — Patient Outreach (Signed)
  Care Coordination   Follow Up Visit Note   09/26/2022 Name: Joshua Lee MRN: [id number] DOB: 1965-01-27  Joshua Lee is a 57 y.o. year old male who sees [name], DO for primary care. I spoke with  Joshua Board Bloyd by phone today.  What matters to the patients health and wellness today?  Bring A1C back down    Goals Addressed             This Visit's Progress    RNCM: Effective Management of DM   On track    Care Coordination Interventions:   Provided education to patient about basic DM disease process Reviewed medications with patient and discussed importance of medication adherence Counseled on importance of regular laboratory monitoring as prescribed Discussed plans with patient for ongoing care management follow up and provided patient with direct contact information for care management team Advised patient, providing education and rationale, to check cbg as directed and record, calling pcp for findings outside established parameters Review of patient status, including review of consultants reports, relevant laboratory and other test results, and medications completed        SDOH assessments and interventions completed:  No     Care Coordination Interventions:  Yes, provided   Interventions Today    Flowsheet Row Most Recent Value  Chronic Disease   Chronic disease during today's visit Diabetes  General Interventions   General Interventions Discussed/Reviewed General Interventions Reviewed, Labs, Doctor Visits  Labs Hgb A1c every 3 months  Doctor Visits Discussed/Reviewed Doctor Visits Reviewed, PCP  [facility] 10/3]  PCP/Specialist Visits Compliance with follow-up visit  Education Interventions   Education Provided Provided Education  Provided Verbal Education On Blood Sugar Monitoring, When to see the doctor, Medication  [Completed Ozempic, now on Januvia.  Blood sugars range 80-150]  Nutrition Interventions   Nutrition Discussed/Reviewed  Nutrition Reviewed, Decreasing sugar intake, Adding fruits and vegetables, Portion sizes, Carbohydrate meal planning       Follow up plan: Follow up call scheduled for 11/8    Encounter Outcome:  Patient Visit Completed   Kemper Durie, RN, MSN, [facility] Care Management Care Management Coordinator [phone]

## 2022-09-28 DIAGNOSIS — D485 Neoplasm of uncertain behavior of skin: Secondary | ICD-10-CM | POA: Diagnosis not present

## 2022-09-29 DIAGNOSIS — M9905 Segmental and somatic dysfunction of pelvic region: Secondary | ICD-10-CM | POA: Diagnosis not present

## 2022-09-29 DIAGNOSIS — M5136 Other intervertebral disc degeneration, lumbar region: Secondary | ICD-10-CM | POA: Diagnosis not present

## 2022-09-29 DIAGNOSIS — M5416 Radiculopathy, lumbar region: Secondary | ICD-10-CM | POA: Diagnosis not present

## 2022-09-29 DIAGNOSIS — M9903 Segmental and somatic dysfunction of lumbar region: Secondary | ICD-10-CM | POA: Diagnosis not present

## 2022-10-07 DIAGNOSIS — M48061 Spinal stenosis, lumbar region without neurogenic claudication: Secondary | ICD-10-CM | POA: Diagnosis not present

## 2022-10-07 DIAGNOSIS — Z79899 Other long term (current) drug therapy: Secondary | ICD-10-CM | POA: Diagnosis not present

## 2022-10-07 DIAGNOSIS — M5126 Other intervertebral disc displacement, lumbar region: Secondary | ICD-10-CM | POA: Diagnosis not present

## 2022-10-07 DIAGNOSIS — M5416 Radiculopathy, lumbar region: Secondary | ICD-10-CM | POA: Diagnosis not present

## 2022-10-07 DIAGNOSIS — M545 Low back pain, unspecified: Secondary | ICD-10-CM | POA: Diagnosis not present

## 2022-10-07 DIAGNOSIS — M5136 Other intervertebral disc degeneration, lumbar region: Secondary | ICD-10-CM | POA: Diagnosis not present

## 2022-10-07 DIAGNOSIS — M7061 Trochanteric bursitis, right hip: Secondary | ICD-10-CM | POA: Diagnosis not present

## 2022-10-07 DIAGNOSIS — M791 Myalgia, unspecified site: Secondary | ICD-10-CM | POA: Diagnosis not present

## 2022-10-07 DIAGNOSIS — M25561 Pain in right knee: Secondary | ICD-10-CM | POA: Diagnosis not present

## 2022-10-14 ENCOUNTER — Other Ambulatory Visit: Payer: Self-pay | Admitting: Family Medicine

## 2022-10-14 NOTE — Telephone Encounter (Signed)
Requested Prescriptions  Pending Prescriptions Disp Refills   amLODipine (NORVASC) 2.5 MG tablet [Pharmacy Med Name: AMLODIPINE BESYLATE 2.5 MG TAB] 30 tablet 2    Sig: TAKE 1 TABLET BY MOUTH EVERY DAY     Cardiovascular: Calcium Channel Blockers 2 Passed - 10/14/2022  1:35 AM      Passed - Last BP in normal range    BP Readings from Last 1 Encounters:  09/13/22 120/75         Passed - Last Heart Rate in normal range    Pulse Readings from Last 1 Encounters:  09/13/22 70         Passed - Valid encounter within last 6 months    Recent Outpatient Visits           1 month ago Bilateral lower extremity edema   [facility] [place], [name], NP   1 month ago Bilateral lower extremity edema   [facility] [place], [name], NP   2 months ago Type 2 diabetes mellitus with stage 3 chronic kidney disease, without long-term current use of insulin, unspecified whether stage 3a or 3b CKD (HCC)   [place] [facility] [place], [name], DO   5 months ago CKD (chronic kidney disease), stage II   [place] [facility] [place], [place], DO   8 months ago Primary hypertension   [place] [facility] [place], [place], DO       Future Appointments             In 1 week [name], DO [facility] [facility], [facility]   In 3 months [name], MD [facility]

## 2022-10-16 ENCOUNTER — Other Ambulatory Visit: Payer: Self-pay | Admitting: Family Medicine

## 2022-10-16 DIAGNOSIS — I10 Essential (primary) hypertension: Secondary | ICD-10-CM

## 2022-10-17 ENCOUNTER — Other Ambulatory Visit: Payer: Self-pay | Admitting: Family Medicine

## 2022-10-17 DIAGNOSIS — E782 Mixed hyperlipidemia: Secondary | ICD-10-CM

## 2022-10-17 DIAGNOSIS — I10 Essential (primary) hypertension: Secondary | ICD-10-CM

## 2022-10-18 NOTE — Telephone Encounter (Signed)
Requested Prescriptions  Pending Prescriptions Disp Refills   atorvastatin (LIPITOR) 80 MG tablet [Pharmacy Med Name: ATORVASTATIN 80 MG TABLET] 90 tablet 0    Sig: TAKE 1 TABLET BY MOUTH EVERYDAY AT BEDTIME     Cardiovascular:  Antilipid - Statins Failed - 10/17/2022  8:30 AM      Failed - Lipid Panel in normal range within the last 12 months    Cholesterol, Total  Date Value Ref Range Status  04/22/2022 110 100 - 199 mg/dL Final   Cholesterol Piccolo, Waived  Date Value Ref Range Status  08/29/2018 108 <200 mg/dL Final    Comment:                            Desirable                <200                         Borderline High      200- 239                         High                     >239    LDL Chol Calc (NIH)  Date Value Ref Range Status  04/22/2022 51 0 - 99 mg/dL Final   HDL  Date Value Ref Range Status  04/22/2022 31 (L) >39 mg/dL Final   Triglycerides  Date Value Ref Range Status  04/22/2022 164 (H) 0 - 149 mg/dL Final   Triglycerides Piccolo,Waived  Date Value Ref Range Status  08/29/2018 221 (H) <150 mg/dL Final    Comment:                            Normal                   <150                         Borderline High     150 - 199                         High                200 - 499                         Very High                >499          Passed - Patient is not pregnant      Passed - Valid encounter within last 12 months    Recent Outpatient Visits           1 month ago Bilateral lower extremity edema   [place] [facility] [place], [name], NP   1 month ago Bilateral lower extremity edema   [place] [facility] [place], [name], NP   2 months ago Type 2 diabetes mellitus with stage 3 chronic kidney disease, without long-term current use of insulin, unspecified whether stage 3a or 3b CKD (HCC)   [place] [facility] [place], [name], DO   5 months ago CKD (  chronic kidney  disease), stage II   [facility] [place], [name], DO   9 months ago Primary hypertension   [place] [facility] [place], Oralia Rud, DO       Future Appointments             In 3 days [name], DO [place] [facility], [facility]   In 3 months [name], MD [facility]             metoprolol tartrate (LOPRESSOR) 100 MG tablet [Pharmacy Med Name: METOPROLOL TARTRATE 100 MG TAB] 180 tablet 0    Sig: TAKE 1 TABLET BY MOUTH TWICE A DAY     Cardiovascular:  Beta Blockers Passed - 10/17/2022  8:30 AM      Passed - Last BP in normal range    BP Readings from Last 1 Encounters:  09/13/22 120/75         Passed - Last Heart Rate in normal range    Pulse Readings from Last 1 Encounters:  09/13/22 70         Passed - Valid encounter within last 6 months    Recent Outpatient Visits           1 month ago Bilateral lower extremity edema   [place] [facility] [place], [name], NP   1 month ago Bilateral lower extremity edema   [place] [facility] [place], [name], NP   2 months ago Type 2 diabetes mellitus with stage 3 chronic kidney disease, without long-term current use of insulin, unspecified whether stage 3a or 3b CKD (HCC)   [place] [facility] [place], [name], DO   5 months ago CKD (chronic kidney disease), stage II   [place] [facility] [place], [place], DO   9 months ago Primary hypertension   [place] [facility] [place], Oralia Rud, DO       Future Appointments             In 3 days [name], DO [place] [facility], [facility]   In 3 months [name], MD [facility]

## 2022-10-18 NOTE — Telephone Encounter (Signed)
Requested Prescriptions  Pending Prescriptions Disp Refills   benazepril (LOTENSIN) 40 MG tablet [Pharmacy Med Name: BENAZEPRIL HCL 40 MG TABLET] 30 tablet 5    Sig: TAKE 1 TABLET BY MOUTH EVERY DAY     Cardiovascular:  ACE Inhibitors Passed - 10/16/2022 11:36 AM      Passed - Cr in normal range and within 180 days    Creatinine, Ser  Date Value Ref Range Status  07/21/2022 0.82 0.76 - 1.27 mg/dL Final         Passed - K in normal range and within 180 days    Potassium  Date Value Ref Range Status  07/21/2022 4.3 3.5 - 5.2 mmol/L Final         Passed - Patient is not pregnant      Passed - Last BP in normal range    BP Readings from Last 1 Encounters:  09/13/22 120/75         Passed - Valid encounter within last 6 months    Recent Outpatient Visits           1 month ago Bilateral lower extremity edema   [place] [facility] [place], [name], NP   1 month ago Bilateral lower extremity edema   [place] [facility] [place], [name], NP   2 months ago Type 2 diabetes mellitus with stage 3 chronic kidney disease, without long-term current use of insulin, unspecified whether stage 3a or 3b CKD (HCC)   [place] [facility] [place], [name], DO   5 months ago CKD (chronic kidney disease), stage II   [place] [facility] [address], [place], DO   9 months ago Primary hypertension   [place] [facility] [place], Oralia Rud, DO       Future Appointments             In 3 days [name], DO [place] [facility]   In 3 months [name], MD [facility]

## 2022-10-21 ENCOUNTER — Ambulatory Visit: Payer: 59 | Admitting: Family Medicine

## 2022-10-21 ENCOUNTER — Encounter: Payer: Self-pay | Admitting: Family Medicine

## 2022-10-21 VITALS — BP 128/77 | HR 76 | Ht 68.0 in | Wt 246.0 lb

## 2022-10-21 DIAGNOSIS — N183 Chronic kidney disease, stage 3 unspecified: Secondary | ICD-10-CM

## 2022-10-21 DIAGNOSIS — R3911 Hesitancy of micturition: Secondary | ICD-10-CM

## 2022-10-21 DIAGNOSIS — Z0000 Encounter for general adult medical examination without abnormal findings: Secondary | ICD-10-CM | POA: Diagnosis not present

## 2022-10-21 DIAGNOSIS — Z7985 Long-term (current) use of injectable non-insulin antidiabetic drugs: Secondary | ICD-10-CM | POA: Diagnosis not present

## 2022-10-21 DIAGNOSIS — E782 Mixed hyperlipidemia: Secondary | ICD-10-CM

## 2022-10-21 DIAGNOSIS — E1122 Type 2 diabetes mellitus with diabetic chronic kidney disease: Secondary | ICD-10-CM

## 2022-10-21 DIAGNOSIS — I10 Essential (primary) hypertension: Secondary | ICD-10-CM | POA: Diagnosis not present

## 2022-10-21 LAB — MICROSCOPIC EXAMINATION
Bacteria, UA: NONE SEEN
Epithelial Cells (non renal): NONE SEEN /[HPF] (ref 0–10)
WBC, UA: NONE SEEN /[HPF] (ref 0–5)

## 2022-10-21 LAB — URINALYSIS, ROUTINE W REFLEX MICROSCOPIC
Bilirubin, UA: NEGATIVE
Ketones, UA: NEGATIVE
Leukocytes,UA: NEGATIVE
Nitrite, UA: NEGATIVE
Protein,UA: NEGATIVE
Specific Gravity, UA: 1.015 (ref 1.005–1.030)
Urobilinogen, Ur: 0.2 mg/dL (ref 0.2–1.0)
pH, UA: 6 (ref 5.0–7.5)

## 2022-10-21 LAB — MICROALBUMIN, URINE WAIVED
Creatinine, Urine Waived: 100 mg/dL (ref 10–300)
Microalb, Ur Waived: 30 mg/L — ABNORMAL HIGH (ref 0–19)
Microalb/Creat Ratio: <30 mg/g (ref <30)

## 2022-10-21 MED ORDER — METOPROLOL TARTRATE 100 MG PO TABS
100.0000 mg | ORAL_TABLET | Freq: Two times a day (BID) | ORAL | 1 refills | Status: DC
Start: 2022-10-21 — End: 2023-02-04

## 2022-10-21 MED ORDER — ATORVASTATIN CALCIUM 80 MG PO TABS
80.0000 mg | ORAL_TABLET | Freq: Every day | ORAL | 1 refills | Status: DC
Start: 2022-10-21 — End: 2023-02-04

## 2022-10-21 MED ORDER — BENAZEPRIL HCL 40 MG PO TABS
40.0000 mg | ORAL_TABLET | Freq: Every day | ORAL | 1 refills | Status: DC
Start: 2022-10-21 — End: 2023-05-04

## 2022-10-21 MED ORDER — TAMSULOSIN HCL 0.4 MG PO CAPS: ORAL_CAPSULE | ORAL | 1 refills | Status: DC

## 2022-10-21 MED ORDER — METFORMIN HCL 500 MG PO TABS
ORAL_TABLET | ORAL | 1 refills | Status: DC
Start: 2022-10-21 — End: 2023-05-04

## 2022-10-21 MED ORDER — AMLODIPINE BESYLATE 2.5 MG PO TABS: 2.5000 mg | ORAL_TABLET | Freq: Every day | ORAL | 1 refills | Status: DC

## 2022-10-21 NOTE — Assessment & Plan Note (Signed)
Under good control on current regimen. Continue current regimen. Continue to monitor. Call with any concerns. Refills given. Labs drawn today.   

## 2022-10-21 NOTE — Assessment & Plan Note (Signed)
Rechecking labs today. Await results. Treat as needed.  °

## 2022-10-21 NOTE — Progress Notes (Signed)
BP 128/77   Pulse 76   Ht 5\' 8"  (1.727 m)   Wt 246 lb (111.6 kg)   SpO2 95%   BMI 37.40 kg/m    Subjective:    Patient ID: Joshua Lee, male    DOB: 1965/12/29, 57 y.o.   MRN: [id number]  HPI: Joshua Lee is a 57 y.o. male presenting on 10/21/2022 for comprehensive medical examination. Current medical complaints include:  DIABETES Hypoglycemic episodes:no Polydipsia/polyuria: yes Visual disturbance: no Chest pain: no Paresthesias: no Glucose Monitoring: yes  Accucheck frequency: Daily, have been up a little bit Taking Insulin?: no Blood Pressure Monitoring: not checking Retinal Examination: Up to Date Foot Exam: Up to Date Diabetic Education: Completed Pneumovax: Up to Date Influenza: Up to Date Aspirin: no  HYPERTENSION / HYPERLIPIDEMIA Satisfied with current treatment? yes Duration of hypertension: chronic BP monitoring frequency: not checking BP medication side effects: no Past BP meds: amlodipine, benazepril, metoprolol Duration of hyperlipidemia: chronic Cholesterol medication side effects: no Cholesterol supplements: none Past cholesterol medications: atorvastatin, vascepa Medication compliance: excellent compliance Aspirin: no Recent stressors: no Recurrent headaches: no Visual changes: no Palpitations: no Dyspnea: no Chest pain: no Lower extremity edema: no Dizzy/lightheaded: no  Interim Problems from his last visit: no  Depression Screen done today and results listed below:     09/24/2021    4:35 PM 08/28/2020   10:19 AM 08/27/2020    3:54 PM 06/10/2020    1:12 PM 05/15/2019   10:18 AM  Depression screen PHQ 2/9  Decreased Interest 0 0 0 0 0  Down, Depressed, Hopeless 0 0 0 0 0  PHQ - 2 Score 0 0 0 0 0    Past Medical History:  Past Medical History:  Diagnosis Date   Diabetes mellitus without complication (HCC)    GERD (gastroesophageal reflux disease)    Hyperlipidemia    Hypertension    Kidney stones    Plantar fascial  fibromatosis     Surgical History:  Past Surgical History:  Procedure Laterality Date   CHOLECYSTECTOMY  2006   GALLBLADDER SURGERY     KIDNEY STONE SURGERY     KNEE ARTHROSCOPY     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   aspirin 81 MG tablet Take 81 mg by mouth daily.   Coenzyme Q-10 100 MG capsule Take 100 mg by mouth daily.   cyclobenzaprine (FLEXERIL) 10 MG tablet Take 1 tablet (10 mg total) by mouth at bedtime.   diclofenac Sodium (VOLTAREN) 1 % GEL Apply 4 g topically 4 (four) times daily.   fluticasone (FLONASE) 50 MCG/ACT nasal spray Place 1 spray into both nostrils 2 (two) times daily.   gabapentin (NEURONTIN) 300 MG capsule Take 300 mg by mouth 3 (three) times daily as needed.   Garlic 1000 MG CAPS Take 1,000 mg by mouth daily.   glucose blood (CONTOUR NEXT TEST) test strip 1 each by Other route as needed for other.   icosapent Ethyl (VASCEPA) 1 g capsule TAKE 2 CAPSULES BY MOUTH TWICE A DAY   JARDIANCE 25 MG TABS tablet TAKE 1 TABLET BY MOUTH DAILY BEFORE BREAKFAST.   naproxen (NAPROSYN) 500  MG tablet TAKE 1 TABLET BY MOUTH 2 TIMES DAILY WITH A MEAL.   Potassium Citrate 15 MEQ (1620 MG) TBCR Take 15 mEq by mouth 2 (two) times daily. 2 tab QAM, 1 tab QPM   saline (AYR) GEL Place 1 Application into both nostrils every 6 (six) hours as needed.   sitaGLIPtin (JANUVIA) 100 MG tablet Take 1 tablet (100 mg total) by mouth daily. Copay CardJohnney Killian:  Lee, PCN:  Lee, ID:  Lee, Grp:  Lee   traMADol (ULTRAM) 50 MG tablet Take 50 mg by mouth every 12 (twelve) hours as needed.   No current facility-administered medications on  file prior to visit.    Allergies:  Allergies  Allergen Reactions   Penicillins Rash    Has patient had a PCN reaction causing immediate rash, facial/tongue/throat swelling, SOB or lightheadedness with hypotension: unknown Has patient had a PCN reaction causing severe rash involving mucus membranes or skin necrosis: unknown Has patient had a PCN reaction that required hospitalization: unknown Has patient had a PCN reaction occurring within the last 10 years: no If all of the above answers are "NO", then may proceed with Cephalosporin use.     Social History:  Social History   Socioeconomic History   Marital status: Single    Spouse name: Not on file   Number of children: Not on file   Years of education: Not on file   Highest education level: Not on file  Occupational History   Not on file  Tobacco Use   Smoking status: Never   Smokeless tobacco: Current    Types: Snuff  Vaping Use   Vaping status: Never Used  Substance and Sexual Activity   Alcohol use: Not Currently    Comment: Occasionally drinks beer   Drug use: No   Sexual activity: Not Currently  Other Topics Concern   Not on file  Social History Narrative   Not on file   Social Determinants of Health   Financial Resource Strain: Low Risk  (09/24/2021)   Overall Financial Resource Strain (CARDIA)    Difficulty of Paying Living Expenses: Not very hard  Food Insecurity: No Food Insecurity (09/24/2021)   Hunger Vital Sign    Worried About Running Out of Food in the Last Year: Never true    Ran Out of Food in the Last Year: Never true  Transportation Needs: No Transportation Needs (09/24/2021)   PRAPARE - Administrator, Civil Service (Medical): No    Lack of Transportation (Non-Medical): No  Physical Activity: Inactive (08/27/2020)   Exercise Vital Sign    Days of Exercise per Week: 0 days    Minutes of Exercise per Session: 0 min  Stress: No Stress Concern Present (08/27/2020)   [employer] of  Occupational Health - Occupational Stress Questionnaire    Feeling of Stress : Not at all  Social Connections: Moderately Isolated (08/27/2020)   Social Connection and Isolation Panel [NHANES]    Frequency of Communication with Friends and Family: More than three times a week    Frequency of Social Gatherings with Friends and Family: More than three times a week    Attends Religious Services: More than 4 times per year    Active Member of [employer] or Organizations: No    Attends Banker Meetings: Never    Marital Status: Never married  Intimate Partner Violence: Not At Risk (09/24/2021)   Humiliation, Afraid, Rape, and Kick questionnaire    Fear of Current or  Ex-Partner: No    Emotionally Abused: No    Physically Abused: No    Sexually Abused: No   Social History   Tobacco Use  Smoking Status Never  Smokeless Tobacco Current   Types: Snuff   Social History   Substance and Sexual Activity  Alcohol Use Not Currently   Comment: Occasionally drinks beer    Family History:  Family History  Problem Relation Age of Onset   Diabetes Maternal Uncle     Past medical history, surgical history, medications, allergies, family history and social history reviewed with patient today and changes made to appropriate areas of the chart.   Review of Systems  Constitutional: Negative.   HENT: Negative.    Eyes: Negative.   Respiratory: Negative.    Cardiovascular: Negative.   Gastrointestinal: Negative.   Genitourinary: Negative.   Musculoskeletal: Negative.   Skin: Negative.   Neurological: Negative.   Endo/Heme/Allergies:  Positive for polydipsia. Negative for environmental allergies. Does not bruise/bleed easily.  Psychiatric/Behavioral: Negative.     All other ROS negative except what is listed above and in the HPI.      Objective:    BP 128/77   Pulse 76   Ht 5\' 8"  (1.727 m)   Wt 246 lb (111.6 kg)   SpO2 95%   BMI 37.40 kg/m   Wt Readings from Last 3  Encounters:  10/21/22 246 lb (111.6 kg)  09/13/22 242 lb (109.8 kg)  08/30/22 240 lb (108.9 kg)    Physical Exam Vitals and nursing note reviewed.  Constitutional:      General: He is not in acute distress.    Appearance: Normal appearance. He is obese. He is not ill-appearing, toxic-appearing or diaphoretic.  HENT:     Head: Normocephalic and atraumatic.     Right Ear: Tympanic membrane, ear canal and external ear normal. There is no impacted cerumen.     Left Ear: Tympanic membrane, ear canal and external ear normal. There is no impacted cerumen.     Nose: Nose normal. No congestion or rhinorrhea.     Mouth/Throat:     Mouth: Mucous membranes are moist.     Pharynx: Oropharynx is clear. No oropharyngeal exudate or posterior oropharyngeal erythema.  Eyes:     General: No scleral icterus.       Right eye: No discharge.        Left eye: No discharge.     Extraocular Movements: Extraocular movements intact.     Conjunctiva/sclera: Conjunctivae normal.     Pupils: Pupils are equal, round, and reactive to light.  Neck:     Vascular: No carotid bruit.  Cardiovascular:     Rate and Rhythm: Normal rate and regular rhythm.     Pulses: Normal pulses.     Heart sounds: No murmur heard.    No friction rub. No gallop.  Pulmonary:     Effort: Pulmonary effort is normal. No respiratory distress.     Breath sounds: Normal breath sounds. No stridor. No wheezing, rhonchi or rales.  Chest:     Chest wall: No tenderness.  Abdominal:     General: Abdomen is flat. Bowel sounds are normal. There is no distension.     Palpations: Abdomen is soft. There is no mass.     Tenderness: There is no abdominal tenderness. There is no right CVA tenderness, left CVA tenderness, guarding or rebound.     Hernia: No hernia is present.  Genitourinary:    Comments: Genital exam  deferred with shared decision making Musculoskeletal:        General: No swelling, tenderness, deformity or signs of injury.      Cervical back: Normal range of motion and neck supple. No rigidity. No muscular tenderness.     Right lower leg: No edema.     Left lower leg: No edema.  Lymphadenopathy:     Cervical: No cervical adenopathy.  Skin:    General: Skin is warm and dry.     Capillary Refill: Capillary refill takes less than 2 seconds.     Coloration: Skin is not jaundiced or pale.     Findings: No bruising, erythema, lesion or rash.  Neurological:     General: No focal deficit present.     Mental Status: He is alert and oriented to person, place, and time.     Cranial Nerves: No cranial nerve deficit.     Sensory: No sensory deficit.     Motor: No weakness.     Coordination: Coordination normal.     Gait: Gait normal.     Deep Tendon Reflexes: Reflexes normal.  Psychiatric:        Mood and Affect: Mood normal.        Behavior: Behavior normal.        Thought Content: Thought content normal.        Judgment: Judgment normal.     Results for orders placed or performed in visit on 07/21/22  Basic metabolic panel  Result Value Ref Range   Glucose 172 (H) 70 - 99 mg/dL   BUN 13 6 - 24 mg/dL   Creatinine, Ser 4.09 0.76 - 1.27 mg/dL   eGFR 811 >91 YN/WGN/5.62   BUN/Creatinine Ratio 16 9 - 20   Sodium 140 134 - 144 mmol/L   Potassium 4.3 3.5 - 5.2 mmol/L   Chloride 99 96 - 106 mmol/L   CO2 23 20 - 29 mmol/L   Calcium 9.3 8.7 - 10.2 mg/dL  Bayer DCA Hb Z3Y Waived  Result Value Ref Range   HB A1C (BAYER DCA - WAIVED) 7.3 (H) 4.8 - 5.6 %      Assessment & Plan:   Problem List Items Addressed This Visit       Cardiovascular and Mediastinum   Hypertension    Under good control on current regimen. Continue current regimen. Continue to monitor. Call with any concerns. Refills given. Labs drawn today.      Relevant Medications   metoprolol tartrate (LOPRESSOR) 100 MG tablet   atorvastatin (LIPITOR) 80 MG tablet   amLODipine (NORVASC) 2.5 MG tablet   benazepril (LOTENSIN) 40 MG tablet      Endocrine   Type 2 diabetes mellitus with stage 3 chronic kidney disease, without long-term current use of insulin (HCC)    Rechecking labs today. Await results. Treat as needed.       Relevant Medications   metFORMIN (GLUCOPHAGE) 500 MG tablet   atorvastatin (LIPITOR) 80 MG tablet   benazepril (LOTENSIN) 40 MG tablet   Other Relevant Orders   Comprehensive metabolic panel   CBC with Differential/Platelet   Lipid Panel w/o Chol/HDL Ratio   TSH   Urinalysis, Routine w reflex microscopic   Microalbumin, Urine Waived   Hgb A1c w/o eAG     Other   Hyperlipidemia    Under good control on current regimen. Continue current regimen. Continue to monitor. Call with any concerns. Refills given. Labs drawn today.       Relevant Medications  metoprolol tartrate (LOPRESSOR) 100 MG tablet   atorvastatin (LIPITOR) 80 MG tablet   amLODipine (NORVASC) 2.5 MG tablet   benazepril (LOTENSIN) 40 MG tablet   Other Visit Diagnoses     Routine general medical examination at a health care facility    -  Primary   Vaccines up to date. Screening labs checked today. Colonoscopy up to date. Continue diet and exercise. Call with any concerns.   Hesitancy       PSA checked today. Await results.   Relevant Orders   PSA        LABORATORY TESTING:  Health maintenance labs ordered today as discussed above.   The natural history of prostate cancer and ongoing controversy regarding screening and potential treatment outcomes of prostate cancer has been discussed with the patient. The meaning of a false positive PSA and a false negative PSA has been discussed. He indicates understanding of the limitations of this screening test and wishes to proceed with screening PSA testing.   IMMUNIZATIONS:   - Tdap: Tetanus vaccination status reviewed: last tetanus booster within 10 years. - Influenza: Up to date - Pneumovax: Up to date - Prevnar: Not applicable - COVID:  will get next time - HPV: Not  applicable - Shingrix vaccine: Up to date  SCREENING: - Colonoscopy: Up to date  Discussed with patient purpose of the colonoscopy is to detect colon cancer at curable precancerous or early stages   PATIENT COUNSELING:    Sexuality: Discussed sexually transmitted diseases, partner selection, use of condoms, avoidance of unintended pregnancy  and contraceptive alternatives.   Advised to avoid cigarette smoking.  I discussed with the patient that most people either abstain from alcohol or drink within safe limits (<=14/week and <=4 drinks/occasion for males, <=7/weeks and <= 3 drinks/occasion for females) and that the risk for alcohol disorders and other health effects rises proportionally with the number of drinks per week and how often a drinker exceeds daily limits.  Discussed cessation/primary prevention of drug use and availability of treatment for abuse.   Diet: Encouraged to adjust caloric intake to maintain  or achieve ideal body weight, to reduce intake of dietary saturated fat and total fat, to limit sodium intake by avoiding high sodium foods and not adding table salt, and to maintain adequate dietary potassium and calcium preferably from fresh fruits, vegetables, and low-fat dairy products.    stressed the importance of regular exercise  Injury prevention: Discussed safety belts, safety helmets, smoke detector, smoking near bedding or upholstery.   Dental health: Discussed importance of regular tooth brushing, flossing, and dental visits.   Follow up plan: NEXT PREVENTATIVE PHYSICAL DUE IN 1 YEAR. Return in about 3 months (around 01/21/2023).

## 2022-10-22 ENCOUNTER — Telehealth: Payer: Self-pay | Admitting: Family Medicine

## 2022-10-22 LAB — COMPREHENSIVE METABOLIC PANEL
ALT: 32 [IU]/L (ref 0–44)
AST: 29 [IU]/L (ref 0–40)
Albumin: 4.3 g/dL (ref 3.8–4.9)
Alkaline Phosphatase: 49 [IU]/L (ref 44–121)
BUN/Creatinine Ratio: 24 — ABNORMAL HIGH (ref 9–20)
BUN: 20 mg/dL (ref 6–24)
Bilirubin Total: 0.6 mg/dL (ref 0.0–1.2)
CO2: 24 mmol/L (ref 20–29)
Calcium: 9.1 mg/dL (ref 8.7–10.2)
Chloride: 97 mmol/L (ref 96–106)
Creatinine, Ser: 0.85 mg/dL (ref 0.76–1.27)
Globulin, Total: 2.4 g/dL (ref 1.5–4.5)
Glucose: 131 mg/dL — ABNORMAL HIGH (ref 70–99)
Potassium: 4.5 mmol/L (ref 3.5–5.2)
Sodium: 137 mmol/L (ref 134–144)
Total Protein: 6.7 g/dL (ref 6.0–8.5)
eGFR: 102 mL/min/{1.73_m2} (ref >59)

## 2022-10-22 LAB — CBC WITH DIFFERENTIAL/PLATELET
Basophils Absolute: 0 10*3/uL (ref 0.0–0.2)
Basos: 0 %
EOS (ABSOLUTE): 0.1 10*3/uL (ref 0.0–0.4)
Eos: 1 %
Hematocrit: 43.5 % (ref 37.5–51.0)
Hemoglobin: 13.7 g/dL (ref 13.0–17.7)
Immature Grans (Abs): 0.1 10*3/uL (ref 0.0–0.1)
Immature Granulocytes: 1 %
Lymphocytes Absolute: 1.6 10*3/uL (ref 0.7–3.1)
Lymphs: 18 %
MCH: 27.3 pg (ref 26.6–33.0)
MCHC: 31.5 g/dL (ref 31.5–35.7)
MCV: 87 fL (ref 79–97)
Monocytes Absolute: 0.7 10*3/uL (ref 0.1–0.9)
Monocytes: 8 %
Neutrophils Absolute: 6.3 10*3/uL (ref 1.4–7.0)
Neutrophils: 72 %
Platelets: 231 10*3/uL (ref 150–450)
RBC: 5.02 x10E6/uL (ref 4.14–5.80)
RDW: 14.5 % (ref 11.6–15.4)
WBC: 8.8 10*3/uL (ref 3.4–10.8)

## 2022-10-22 LAB — LIPID PANEL W/O CHOL/HDL RATIO
Cholesterol, Total: 113 mg/dL (ref 100–199)
HDL: 30 mg/dL — ABNORMAL LOW (ref >39)
LDL Chol Calc (NIH): 59 mg/dL (ref 0–99)
Triglycerides: 136 mg/dL (ref 0–149)
VLDL Cholesterol Cal: 24 mg/dL (ref 5–40)

## 2022-10-22 LAB — HGB A1C W/O EAG: Hgb A1c MFr Bld: 7.6 % — ABNORMAL HIGH (ref 4.8–5.6)

## 2022-10-22 LAB — TSH: TSH: 2.39 u[IU]/mL (ref 0.450–4.500)

## 2022-10-22 LAB — PSA: Prostate Specific Ag, Serum: 0.5 ng/mL (ref 0.0–4.0)

## 2022-10-22 NOTE — Telephone Encounter (Signed)
Will respond via result note

## 2022-10-22 NOTE — Telephone Encounter (Signed)
Patient has called to check on his lab results. He states he was supposed to get a call about his results and his A1C results so he knows if he needs his test strips or not.  Patients callback # [phone]

## 2022-10-27 ENCOUNTER — Telehealth: Payer: Self-pay | Admitting: *Deleted

## 2022-10-27 DIAGNOSIS — M9903 Segmental and somatic dysfunction of lumbar region: Secondary | ICD-10-CM | POA: Diagnosis not present

## 2022-10-27 DIAGNOSIS — M5416 Radiculopathy, lumbar region: Secondary | ICD-10-CM | POA: Diagnosis not present

## 2022-10-27 DIAGNOSIS — M5136 Other intervertebral disc degeneration, lumbar region with discogenic back pain only: Secondary | ICD-10-CM | POA: Diagnosis not present

## 2022-10-27 DIAGNOSIS — M9905 Segmental and somatic dysfunction of pelvic region: Secondary | ICD-10-CM | POA: Diagnosis not present

## 2022-10-27 NOTE — Telephone Encounter (Signed)
Pt requesting  lab results from 10/21/22 . No response from PCP at this time regarding results. 10/22/22 message from PCP reports will respond via result note.  Pt verbalized understanding. Patient reports he has received steroid shots in back , hip and knee with in the last 2 months. Please advise .

## 2022-11-17 ENCOUNTER — Telehealth: Payer: Self-pay

## 2022-11-17 NOTE — Progress Notes (Signed)
11/17/2022  Patient ID: Joshua Lee, male   DOB: 04-08-1965, 57 y.o.   MRN: [id number]  Patient outreach to return missed call/voicemail in regard to recently increased cost of a medication.  I was not able to reach the patient but did leave HIPAA compliant voicemail with my direct phone number.  Lenna Gilford, PharmD, DPLA

## 2022-11-18 DIAGNOSIS — M25561 Pain in right knee: Secondary | ICD-10-CM | POA: Diagnosis not present

## 2022-11-18 DIAGNOSIS — M7061 Trochanteric bursitis, right hip: Secondary | ICD-10-CM | POA: Diagnosis not present

## 2022-11-18 DIAGNOSIS — E119 Type 2 diabetes mellitus without complications: Secondary | ICD-10-CM | POA: Diagnosis not present

## 2022-11-24 DIAGNOSIS — M9905 Segmental and somatic dysfunction of pelvic region: Secondary | ICD-10-CM | POA: Diagnosis not present

## 2022-11-24 DIAGNOSIS — M5416 Radiculopathy, lumbar region: Secondary | ICD-10-CM | POA: Diagnosis not present

## 2022-11-24 DIAGNOSIS — M5136 Other intervertebral disc degeneration, lumbar region with discogenic back pain only: Secondary | ICD-10-CM | POA: Diagnosis not present

## 2022-11-24 DIAGNOSIS — M9903 Segmental and somatic dysfunction of lumbar region: Secondary | ICD-10-CM | POA: Diagnosis not present

## 2022-11-26 ENCOUNTER — Encounter: Payer: 59 | Admitting: *Deleted

## 2022-11-29 ENCOUNTER — Ambulatory Visit: Payer: Self-pay | Admitting: *Deleted

## 2022-11-29 NOTE — Patient Outreach (Signed)
  Care Coordination   Follow Up Visit Note   11/29/2022 Name: Joshua Lee MRN: [id number] DOB: 1965-02-14  Joshua Board Marlett is a 57 y.o. year old male who sees [name], DO for primary care. I spoke with  Joshua Lee by phone today.  What matters to the patients health and wellness today?  Maintaining good diabetic diet and decrease A1C.    Goals Addressed             This Visit's Progress    RNCM: Effective Management of DM   On track    Care Coordination Interventions:   Provided education to patient about basic DM disease process Reviewed medications with patient and discussed importance of medication adherence Counseled on importance of regular laboratory monitoring as prescribed Discussed plans with patient for ongoing care management follow up and provided patient with direct contact information for care management team Advised patient, providing education and rationale, to check cbg as directed and record, calling pcp for findings outside established parameters Review of patient status, including review of consultants reports, relevant laboratory and other test results, and medications completed        SDOH assessments and interventions completed:  No     Care Coordination Interventions:  Yes, provided   Interventions Today    Flowsheet Row Most Recent Value  Chronic Disease   Chronic disease during today's visit Diabetes, Hypertension (HTN)  General Interventions   General Interventions Discussed/Reviewed General Interventions Reviewed, Labs, Doctor Visits, Annual Eye Exam, Annual Foot Exam, Vaccines  Labs Hgb A1c every 3 months  Vaccines Flu  Doctor Visits Discussed/Reviewed Doctor Visits Discussed, Doctor Visits Reviewed, PCP, Specialist  [Upcoming with PCP 1/3 and cardiology 1/17]  PCP/Specialist Visits Compliance with follow-up visit  Education Interventions   Education Provided Provided Education  Provided Verbal Education On  Medication, When to see the doctor, Blood Sugar Monitoring, Nutrition, Labs  [educated on best time to check blood sugar daily.]  Labs Reviewed Hgb A1c  [Recent A1C 7.6]  Nutrition Interventions   Nutrition Discussed/Reviewed Nutrition Reviewed, Carbohydrate meal planning, Decreasing sugar intake, Portion sizes, Adding fruits and vegetables       Follow up plan: Follow up call scheduled for 1/21    Encounter Outcome:  Patient Visit Completed   Kemper Durie RN, MSN, CCM [place]  [facility], [facility] RN Care Coordinator Direct Dial: [phone] / Main [phone] Fax [phone] Email: [email or web address] Website: [email or web address]

## 2022-12-02 ENCOUNTER — Telehealth: Payer: Self-pay

## 2022-12-02 NOTE — Progress Notes (Signed)
   12/02/2022  Patient ID: Joshua Lee, male   DOB: 21-Mar-1965, 57 y.o.   MRN: [id number]  S/O Returning missed call/voicemail from patient regarding insurance selection for 2025  Diabetes Management -Current medications:  Januvia 100mg  daily, metformin 1000mg  BID, Jardiance 25mg  daily -Patient has been on Ozempic and Trulicity in the past, which he tolerated well but could not continue due to affordability barriers -Since stopping GLP1 and starting Januvia 100mg  daily, A1c has increased to 7.6% -Patient inquiring about insurance plan selection for 2025  A/P  Diabetes Management -Currently uncontrolled -Patient sees PCP 1/3 and is due for A1c -Patient has appointment to review exchange plan options for 2025.  I recommended verifying coverage of current medications as well as coverage of Ozempic or Trulicity.  Recommended being mindful of deductibles required to be met prior to copays going into affect- this was the limiting factor this year in regard to GLP1 medications.  Advised that a lower deductible will mean a higher premium, so this could be a barrier -Based on A1c and plan changes, I will follow-up with patient after the beginning of the year to assess ability to switch back to GLP1 from Januvia  Lenna Gilford, PharmD, DPLA

## 2022-12-15 ENCOUNTER — Telehealth: Payer: Self-pay | Admitting: Family Medicine

## 2022-12-15 DIAGNOSIS — M549 Dorsalgia, unspecified: Secondary | ICD-10-CM

## 2022-12-15 NOTE — Telephone Encounter (Signed)
Copied from CRM [id number]. Topic: Referral - Request for Referral >> Dec 15, 2022  2:35 PM Franchot Heidelberg wrote: Did the patient discuss referral with their provider in the last year? No   Appointment offered? Yes, but patient declined because he says he has been going for years. Says with a referral his insurance will cover more of the visit with a referral.   Type of order/referral and detailed reason for visit: Chiropractor   Preference of office, provider, location: Goes 12/22/2022 at 9:30 am to Dr. Rhodia Albright in [place] on [address]  Fax: [phone]  If referral order, have you been seen by this specialty before? Yes (If Yes, this issue or another issue? When? Where?  Can we respond through MyChart? Yes

## 2022-12-20 ENCOUNTER — Telehealth: Payer: Self-pay

## 2022-12-20 NOTE — Progress Notes (Unsigned)
   12/20/2022  Patient ID: Joshua Lee, male   DOB: Aug 22, 1965, 57 y.o.   MRN: [id number]  S/O Returning missed call/voicemail from patient regarding recent approval of Medicaid benefits  Diabetes Management Plan -Current medications:  Januvia 100mg  daily, Jardiance 25mg  daily, metformin 1000mg  BID -Patient previously on GLP1 therapy, which had T2DM well controlled; but this was stopped due to affordability -Patient has been approved for full [place] Medicaid benefits, which should cover Ozemipc -Last A1c 7.6% on 10/3, which was elevated from previous readings while on GLP1  A/P  Diabetes Management Plan -Recommend stopping Januvia and restarting Ozempic theray with 0.25mg  weekly since patient has not had medication in quite some time.  Can attempt quick(er) titration of doses if patient tolerates.  He was on 1mg  weekly previously and tolerated very well. -Submitted prior authorization for Ozempic 0.25mg  weekly to insurance via CoverMyMeds -Coordinating with Dr. Laural Benes to verify she agrees with plan; if so, will pend prescription for her to sign once PA approved  Follow-up:  Will follow-up with patient regarding PA and plan  Lenna Gilford, PharmD, DPLA

## 2022-12-21 ENCOUNTER — Telehealth: Payer: Self-pay

## 2022-12-21 MED ORDER — OZEMPIC (0.25 OR 0.5 MG/DOSE) 2 MG/3ML ~~LOC~~ SOPN: 0.2500 mg | PEN_INJECTOR | SUBCUTANEOUS | 0 refills | Status: DC

## 2022-12-21 NOTE — Progress Notes (Signed)
   12/21/2022  Patient ID: Joshua Lee, male   DOB: 29-Apr-1965, 57 y.o.   MRN: [id number]  Patient outreach to inform [name] Dr. Laural Benes is in agreement with plan to restart Ozempic, and the prior authorization has already been approved on Medicaid.  -Patient will complete the 3 week supply on hand of Januvia 100mg  daily, then start Ozempic 0.25mg  weekly -Telephone follow-up 1/7, which will be approximately 2 weeks after restarting Ozempic to determine if we can move up to 0.5mg  weekly  Lenna Gilford, PharmD, DPLA

## 2022-12-22 DIAGNOSIS — M5416 Radiculopathy, lumbar region: Secondary | ICD-10-CM | POA: Diagnosis not present

## 2022-12-22 DIAGNOSIS — M9905 Segmental and somatic dysfunction of pelvic region: Secondary | ICD-10-CM | POA: Diagnosis not present

## 2022-12-22 DIAGNOSIS — M9903 Segmental and somatic dysfunction of lumbar region: Secondary | ICD-10-CM | POA: Diagnosis not present

## 2022-12-22 DIAGNOSIS — M5136 Other intervertebral disc degeneration, lumbar region with discogenic back pain only: Secondary | ICD-10-CM | POA: Diagnosis not present

## 2022-12-28 DIAGNOSIS — K649 Unspecified hemorrhoids: Secondary | ICD-10-CM | POA: Diagnosis not present

## 2022-12-28 DIAGNOSIS — J028 Acute pharyngitis due to other specified organisms: Secondary | ICD-10-CM | POA: Diagnosis not present

## 2023-01-04 ENCOUNTER — Other Ambulatory Visit: Payer: Self-pay | Admitting: Family Medicine

## 2023-01-04 DIAGNOSIS — N183 Chronic kidney disease, stage 3 unspecified: Secondary | ICD-10-CM

## 2023-01-04 NOTE — Telephone Encounter (Signed)
Requested medications are due for refill today.  unsure  Requested medications are on the active medications list.  yeyys  Last refill. 04/22/2022 #100 12 rf  Future visit scheduled.   yes  Notes to clinic.  Pharmacy comment: Alternative Requested:[facility] ONLY PAYS FOR ACCU CHEK GUIDE SUPPLIES.     Requested Prescriptions  Pending Prescriptions Disp Refills   CONTOUR NEXT TEST test strip [Pharmacy Med Name: CONTOUR NEXT TEST STRIP] 100 strip 12    Sig: USE AS DIRECTED     Endocrinology: Diabetes - Testing Supplies Passed - 01/04/2023  5:26 PM      Passed - Valid encounter within last 12 months    Recent Outpatient Visits           2 months ago Routine general medical examination at a health care facility   [facility], [name], DO   3 months ago Bilateral lower extremity edema   [place] [facility] [place], [name], NP   4 months ago Bilateral lower extremity edema   [place] [facility] [place], [name], NP   5 months ago Type 2 diabetes mellitus with stage 3 chronic kidney disease, without long-term current use of insulin, unspecified whether stage 3a or 3b CKD (HCC)   [place] [facility] [place], [name], DO   8 months ago CKD (chronic kidney disease), stage II   [place] [facility] [name], DO       Future Appointments             In 2 weeks [name], DO  [facility]   In 1 month [name], MD [facility]

## 2023-01-05 ENCOUNTER — Telehealth: Payer: Self-pay

## 2023-01-05 NOTE — Progress Notes (Unsigned)
   01/05/2023  Patient ID: Joshua Lee, male   DOB: [birth year], 57 y.o.   MRN: [id number]  Incoming call from patient stating his new insurance will not cover his One Touch diabetic testing supplies, and he is needing new prescriptions sent to the pharmacy.  Medicaid formulary prefers Accu Chek testing supplies, and it looks like an order for test strips has been sent to the pharmacy; but patient will need a new meter to use these with as well as lancets compatible to lancing device that comes with that meter.  Pending prescriptions for Dr. Laural Benes to sign if in agreement.  Lenna Gilford, PharmD, DPLA

## 2023-01-06 MED ORDER — ACCU-CHEK SOFTCLIX LANCETS MISC: 12 refills | Status: AC

## 2023-01-06 MED ORDER — ACCU-CHEK GUIDE ME W/DEVICE KIT: PACK | 0 refills | Status: AC

## 2023-01-10 ENCOUNTER — Telehealth: Payer: Self-pay

## 2023-01-10 ENCOUNTER — Other Ambulatory Visit: Payer: Self-pay | Admitting: Family Medicine

## 2023-01-10 NOTE — Progress Notes (Signed)
   01/10/2023  Patient ID: Joshua Lee, male   DOB: 29-Sep-1965, 57 y.o.   MRN: [id number]  Received a voicemail from patient from Friday evening stating his glucometer was going to be $30 copay.  [facility]ed [facility] to provide billing information for free Accu Check meter on Medicaid, but they state all test strips went through for $0 and were picked up 12/20.  Lenna Gilford, PharmD, DPLA

## 2023-01-11 DIAGNOSIS — M9903 Segmental and somatic dysfunction of lumbar region: Secondary | ICD-10-CM | POA: Diagnosis not present

## 2023-01-11 DIAGNOSIS — M9905 Segmental and somatic dysfunction of pelvic region: Secondary | ICD-10-CM | POA: Diagnosis not present

## 2023-01-11 DIAGNOSIS — M5416 Radiculopathy, lumbar region: Secondary | ICD-10-CM | POA: Diagnosis not present

## 2023-01-11 DIAGNOSIS — M5136 Other intervertebral disc degeneration, lumbar region with discogenic back pain only: Secondary | ICD-10-CM | POA: Diagnosis not present

## 2023-01-11 NOTE — Telephone Encounter (Signed)
Requested Prescriptions  Pending Prescriptions Disp Refills   Semaglutide,0.25 or 0.5MG /DOS, (OZEMPIC, 0.25 OR 0.5 MG/DOSE,) 2 MG/3ML SOPN [Pharmacy Med Name: OZEMPIC 0.25-0.5 MG/DOSE PEN] 9 mL 0    Sig: INJECT 0.25MG  INTO THE SKIN ONE TIME PER WEEK     Endocrinology:  Diabetes - GLP-1 Receptor Agonists - semaglutide Failed - 01/11/2023 11:44 AM      Failed - HBA1C in normal range and within 180 days    Hemoglobin A1C  Date Value Ref Range Status  09/04/2015 6.9  Final  09/04/2015 6.9  Final   HB A1C (BAYER DCA - WAIVED)  Date Value Ref Range Status  07/21/2022 7.3 (H) 4.8 - 5.6 % Final    Comment:             Prediabetes: 5.7 - 6.4          Diabetes: >6.4          Glycemic control for adults with diabetes: <7.0    Hgb A1c MFr Bld  Date Value Ref Range Status  10/21/2022 7.6 (H) 4.8 - 5.6 % Final    Comment:             Prediabetes: 5.7 - 6.4          Diabetes: >6.4          Glycemic control for adults with diabetes: <7.0          Passed - Cr in normal range and within 360 days    Creatinine, Ser  Date Value Ref Range Status  10/21/2022 0.85 0.76 - 1.27 mg/dL Final         Passed - Valid encounter within last 6 months    Recent Outpatient Visits           2 months ago Routine general medical examination at a health care facility   [facility], [name], DO   4 months ago Bilateral lower extremity edema   [place] [facility] [place], [name], NP   4 months ago Bilateral lower extremity edema   [place] [facility] [place], [name], NP   5 months ago Type 2 diabetes mellitus with stage 3 chronic kidney disease, without long-term current use of insulin, unspecified whether stage 3a or 3b CKD (HCC)   [place] [facility] [place], [name], DO   8 months ago CKD (chronic kidney disease), stage II   [place] [facility] [name], DO       Future  Appointments             In 1 week [name], DO [place] [facility]   In 3 weeks [name], MD [facility]

## 2023-01-11 NOTE — Progress Notes (Signed)
   01/11/2023  Patient ID: Joshua Lee, male   DOB: Nov 12, 1965, 57 y.o.   MRN: [id number]  Patient reached out to verify dosing of Ozempic that he is about to restart now that insurance is covering and he has completed Januvia 100mg  on hand.  Patient has not been on GLP1 therapy in  >1 month due to affordability; therefore, I recommend starting back at 0.25mg  weekly for at least 2 weeks.  We have a follow-up scheduled for 1/7 and will discuss increasing dose then based on tolerance and BG.    Patient also endorses picking up new glucometer and testing supplies, but he does need assistance using his new Accu-Chek Guide meter.  He has a visit with Dr. Laural Benes on 1/3 and plans to bring supplies in.  I will notify office to verify someone will be able to assist him.  Lenna Gilford, PharmD, DPLA

## 2023-01-13 DIAGNOSIS — M7061 Trochanteric bursitis, right hip: Secondary | ICD-10-CM | POA: Diagnosis not present

## 2023-01-13 DIAGNOSIS — Z79891 Long term (current) use of opiate analgesic: Secondary | ICD-10-CM | POA: Diagnosis not present

## 2023-01-13 DIAGNOSIS — Z79899 Other long term (current) drug therapy: Secondary | ICD-10-CM | POA: Diagnosis not present

## 2023-01-13 DIAGNOSIS — Z5181 Encounter for therapeutic drug level monitoring: Secondary | ICD-10-CM | POA: Diagnosis not present

## 2023-01-13 DIAGNOSIS — M5416 Radiculopathy, lumbar region: Secondary | ICD-10-CM | POA: Diagnosis not present

## 2023-01-17 ENCOUNTER — Other Ambulatory Visit: Payer: Self-pay | Admitting: Family Medicine

## 2023-01-18 DIAGNOSIS — M5136 Other intervertebral disc degeneration, lumbar region with discogenic back pain only: Secondary | ICD-10-CM | POA: Diagnosis not present

## 2023-01-18 DIAGNOSIS — M9905 Segmental and somatic dysfunction of pelvic region: Secondary | ICD-10-CM | POA: Diagnosis not present

## 2023-01-18 DIAGNOSIS — L280 Lichen simplex chronicus: Secondary | ICD-10-CM | POA: Diagnosis not present

## 2023-01-18 DIAGNOSIS — L570 Actinic keratosis: Secondary | ICD-10-CM | POA: Diagnosis not present

## 2023-01-18 DIAGNOSIS — M9903 Segmental and somatic dysfunction of lumbar region: Secondary | ICD-10-CM | POA: Diagnosis not present

## 2023-01-18 DIAGNOSIS — M5416 Radiculopathy, lumbar region: Secondary | ICD-10-CM | POA: Diagnosis not present

## 2023-01-20 ENCOUNTER — Other Ambulatory Visit: Payer: Self-pay | Admitting: Family Medicine

## 2023-01-20 NOTE — Telephone Encounter (Signed)
 Medication Refill -  Most Recent Primary Care Visit:  Provider: VICCI DUWAINE SQUIBB  Department: [facility]  Visit Type: OFFICE VISIT  Date: 10/21/2022  Medication: empagliflozin  (JARDIANCE ) 25 MG TABS tablet  Has the patient contacted their pharmacy? Yes  Is this the correct pharmacy for this prescription? Yes If no, delete pharmacy and type the correct one.  This is the patient's preferred pharmacy[id number] [address] [place] [address] Phone: [phone] Fax: [phone]  Has the prescription been filled recently? No  Is the patient out of the medication? Yes  Has the patient been seen for an appointment in the last year OR does the patient have an upcoming appointment? Yes  Can we respond through MyChart? No  Agent: Please be advised that Rx refills may take up to 3 business days. We ask that you follow-up with your pharmacy.

## 2023-01-21 ENCOUNTER — Ambulatory Visit: Payer: Medicaid Other | Admitting: Family Medicine

## 2023-01-21 ENCOUNTER — Telehealth: Payer: Self-pay

## 2023-01-21 ENCOUNTER — Encounter: Payer: Self-pay | Admitting: Family Medicine

## 2023-01-21 VITALS — BP 136/71 | HR 88 | Wt 251.8 lb

## 2023-01-21 DIAGNOSIS — E1122 Type 2 diabetes mellitus with diabetic chronic kidney disease: Secondary | ICD-10-CM

## 2023-01-21 DIAGNOSIS — Z7985 Long-term (current) use of injectable non-insulin antidiabetic drugs: Secondary | ICD-10-CM | POA: Diagnosis not present

## 2023-01-21 DIAGNOSIS — N183 Chronic kidney disease, stage 3 unspecified: Secondary | ICD-10-CM

## 2023-01-21 LAB — BAYER DCA HB A1C WAIVED: HB A1C (BAYER DCA - WAIVED): 7.7 % — ABNORMAL HIGH (ref 4.8–5.6)

## 2023-01-21 MED ORDER — SEMAGLUTIDE (1 MG/DOSE) 4 MG/3ML ~~LOC~~ SOPN: 1.0000 mg | PEN_INJECTOR | SUBCUTANEOUS | 0 refills | Status: DC

## 2023-01-21 NOTE — Patient Instructions (Addendum)
 01/24/23- 0.25 ozempic 01/31/23- 0.25 ozempic 02/07/23- 0.5mg  ozempic 02/14/23- 0.5mg  ozempic 02/21/23- 0.5mg  ozempic 02/28/23- 0.5mg  ozempic 03/07/23- 1mg  ozempic

## 2023-01-21 NOTE — Progress Notes (Signed)
 BP 136/71   Pulse 88   Wt 251 lb 12.8 oz (114.2 kg)   SpO2 97%   BMI 38.29 kg/m    Subjective:    Patient ID: Joshua Lee, male    DOB: [birth year], 58 y.o.   MRN: [id number]  HPI: Joshua Lee is a 58 y.o. male  Chief Complaint  Patient presents with   Diabetes    Patient most recent Diabetic Eye Exam was requested at today's visit.    DIABETES- has had 2 weeks of the ozempic , tolerating it well. No concerns.  Hypoglycemic episodes:no Polydipsia/polyuria: no Visual disturbance: no Chest pain: no Paresthesias: no Glucose Monitoring: yes  Accucheck frequency: Daily- doesn't seem to be coming down Taking Insulin ?: no Blood Pressure Monitoring: not checking Retinal Examination: Up to Date Foot Exam: Up to Date Diabetic Education: Completed Pneumovax: Up to Date Influenza: Up to Date Aspirin : yes   Relevant past medical, surgical, family and social history reviewed and updated as indicated. Interim medical history since our last visit reviewed. Allergies and medications reviewed and updated.  Review of Systems  Constitutional: Negative.   Respiratory: Negative.    Cardiovascular: Negative.   Musculoskeletal: Negative.   Neurological: Negative.   Psychiatric/Behavioral: Negative.      Per HPI unless specifically indicated above     Objective:    BP 136/71   Pulse 88   Wt 251 lb 12.8 oz (114.2 kg)   SpO2 97%   BMI 38.29 kg/m   Wt Readings from Last 3 Encounters:  01/21/23 251 lb 12.8 oz (114.2 kg)  10/21/22 246 lb (111.6 kg)  09/13/22 242 lb (109.8 kg)    Physical Exam Vitals and nursing note reviewed.  Constitutional:      General: He is not in acute distress.    Appearance: Normal appearance. He is obese. He is not ill-appearing, toxic-appearing or diaphoretic.  HENT:     Head: Normocephalic and atraumatic.     Right Ear: External ear normal.     Left Ear: External ear normal.     Nose: Nose normal.     Mouth/Throat:     Mouth: Mucous  membranes are moist.     Pharynx: Oropharynx is clear.  Eyes:     General: No scleral icterus.       Right eye: No discharge.        Left eye: No discharge.     Extraocular Movements: Extraocular movements intact.     Conjunctiva/sclera: Conjunctivae normal.     Pupils: Pupils are equal, round, and reactive to light.  Cardiovascular:     Rate and Rhythm: Normal rate and regular rhythm.     Pulses: Normal pulses.     Heart sounds: Normal heart sounds. No murmur heard.    No friction rub. No gallop.  Pulmonary:     Effort: Pulmonary effort is normal. No respiratory distress.     Breath sounds: Normal breath sounds. No stridor. No wheezing, rhonchi or rales.  Chest:     Chest wall: No tenderness.  Musculoskeletal:        General: Normal range of motion.     Cervical back: Normal range of motion and neck supple.  Skin:    General: Skin is warm and dry.     Capillary Refill: Capillary refill takes less than 2 seconds.     Coloration: Skin is not jaundiced or pale.     Findings: No bruising, erythema, lesion or rash.  Neurological:  General: No focal deficit present.     Mental Status: He is alert and oriented to person, place, and time. Mental status is at baseline.  Psychiatric:        Mood and Affect: Mood normal.        Behavior: Behavior normal.        Thought Content: Thought content normal.        Judgment: Judgment normal.     Results for orders placed or performed in visit on 01/21/23  Bayer DCA Hb A1c Waived   Collection Time: 01/21/23 10:58 AM  Result Value Ref Range   HB A1C (BAYER DCA - WAIVED) 7.7 (H) 4.8 - 5.6 %      Assessment & Plan:   Problem List Items Addressed This Visit       Endocrine   Type 2 diabetes mellitus with stage 3 chronic kidney disease, without long-term current use of insulin  (HCC) - Primary   Stable with A1c of 7.7- now back on jardiance . Continue titration up. Recheck 3 months. Call with any concerns.       Relevant Medications    Semaglutide , 1 MG/DOSE, 4 MG/3ML SOPN (Start on 03/04/2023)   Other Relevant Orders   Bayer DCA Hb A1c Waived (Completed)     Follow up plan: Return in about 3 months (around 04/21/2023).

## 2023-01-21 NOTE — Progress Notes (Signed)
   01/21/2023  Patient ID: Joshua Lee [employer], male   DOB: 1965-08-01, 57 y.o.   MRN: [id number]  Patient outreach to return missed call/voicemail from earlier this week while I was out of office.  Patient states he was having an issue getting his Jardiance  filled, but states he [facility] today that this is ready for him to pick up.  We already have a telephone follow-up scheduled next week to review recent A1c and BG control.  Joshua Lee, PharmD, DPLA

## 2023-01-21 NOTE — Telephone Encounter (Signed)
 Requested Prescriptions  Pending Prescriptions Disp Refills   empagliflozin  (JARDIANCE ) 25 MG TABS tablet [Pharmacy Med Name: JARDIANCE  25 MG TABLET] 30 tablet 2    Sig: TAKE 1 TABLET BY MOUTH EVERY DAY BEFORE BREAKFAST     Endocrinology:  Diabetes - SGLT2 Inhibitors Passed - 01/21/2023  8:21 AM      Passed - Cr in normal range and within 360 days    Creatinine, Ser  Date Value Ref Range Status  10/21/2022 0.85 0.76 - 1.27 mg/dL Final         Passed - HBA1C is between 0 and 7.9 and within 180 days    Hemoglobin A1C  Date Value Ref Range Status  09/04/2015 6.9  Final  09/04/2015 6.9  Final   HB A1C (BAYER DCA - WAIVED)  Date Value Ref Range Status  07/21/2022 7.3 (H) 4.8 - 5.6 % Final    Comment:             Prediabetes: 5.7 - 6.4          Diabetes: >6.4          Glycemic control for adults with diabetes: <7.0    Hgb A1c MFr Bld  Date Value Ref Range Status  10/21/2022 7.6 (H) 4.8 - 5.6 % Final    Comment:             Prediabetes: 5.7 - 6.4          Diabetes: >6.4          Glycemic control for adults with diabetes: <7.0          Passed - eGFR in normal range and within 360 days    GFR calc Af Amer  Date Value Ref Range Status  02/28/2020 114 >59 mL/min/1.73 Final    Comment:    **In accordance with recommendations from the NKF-ASN Task force,**   [facility] is in the process of updating its eGFR calculation to the   2021 CKD-EPI creatinine equation that estimates kidney function   without a race variable.    GFR calc non Af Amer  Date Value Ref Range Status  02/28/2020 98 >59 mL/min/1.73 Final   eGFR  Date Value Ref Range Status  10/21/2022 102 >59 mL/min/1.73 Final         Passed - Valid encounter within last 6 months    Recent Outpatient Visits           3 months ago Routine general medical examination at a health care facility   [facility], [name], DO   4 months ago Bilateral lower extremity edema   [place] [facility] [place], Hyla Givens, NP   4 months ago Bilateral lower extremity edema   [place] [facility] [facility] [place], Hyla Givens, NP   6 months ago Type 2 diabetes mellitus with stage 3 chronic kidney disease, without long-term current use of insulin , unspecified whether stage 3a or 3b CKD (HCC)   [place] [facility] [place], [name], DO   9 months ago CKD (chronic kidney disease), stage II   [place] [facility] [place], Duwaine SQUIBB, DO       Future Appointments             Today Vicci Duwaine SQUIBB, DO [place] [facility]   In 2 weeks Fernand Denyse LABOR, MD [facility]

## 2023-01-21 NOTE — Assessment & Plan Note (Signed)
 Stable with A1c of 7.7- now back on jardiance. Continue titration up. Recheck 3 months. Call with any concerns.

## 2023-01-24 NOTE — Telephone Encounter (Signed)
 Disp Refills Start End   empagliflozin  (JARDIANCE ) 25 MG TABS tablet 30 tablet 2 01/21/2023 --   Sig: TAKE 1 TABLET BY MOUTH EVERY DAY BEFORE BREAKFAST   Sent to pharmacy as: empagliflozin  (JARDIANCE ) 25 MG Tab tablet   E-Prescribing Status: Receipt confirmed by pharmacy (01/21/2023  8:21 AM EST)     Requested Prescriptions  Pending Prescriptions Disp Refills   empagliflozin  (JARDIANCE ) 25 MG TABS tablet 30 tablet 5    Sig: Take 1 tablet (25 mg total) by mouth daily before breakfast.     Endocrinology:  Diabetes - SGLT2 Inhibitors Passed - 01/24/2023 12:10 PM      Passed - Cr in normal range and within 360 days    Creatinine, Ser  Date Value Ref Range Status  10/21/2022 0.85 0.76 - 1.27 mg/dL Final         Passed - HBA1C is between 0 and 7.9 and within 180 days    Hemoglobin A1C  Date Value Ref Range Status  09/04/2015 6.9  Final  09/04/2015 6.9  Final   HB A1C (BAYER DCA - WAIVED)  Date Value Ref Range Status  01/21/2023 7.7 (H) 4.8 - 5.6 % Final    Comment:             Prediabetes: 5.7 - 6.4          Diabetes: >6.4          Glycemic control for adults with diabetes: <7.0          Passed - eGFR in normal range and within 360 days    GFR calc Af Amer  Date Value Ref Range Status  02/28/2020 114 >59 mL/min/1.73 Final    Comment:    **In accordance with recommendations from the NKF-ASN Task force,**   [facility] is in the process of updating its eGFR calculation to the   2021 CKD-EPI creatinine equation that estimates kidney function   without a race variable.    GFR calc non Af Amer  Date Value Ref Range Status  02/28/2020 98 >59 mL/min/1.73 Final   eGFR  Date Value Ref Range Status  10/21/2022 102 >59 mL/min/1.73 Final         Passed - Valid encounter within last 6 months    Recent Outpatient Visits           3 days ago Type 2 diabetes mellitus with stage 3 chronic kidney disease, without long-term current use of insulin , unspecified whether stage 3a or 3b CKD  (HCC)   [place] [facility] [place], [name], DO   3 months ago Routine general medical examination at a health care facility   [facility], [name], DO   4 months ago Bilateral lower extremity edema   [place] [facility] [place], Hyla Givens, NP   4 months ago Bilateral lower extremity edema   [place] [facility] [place], Hyla Givens, NP   6 months ago Type 2 diabetes mellitus with stage 3 chronic kidney disease, without long-term current use of insulin , unspecified whether stage 3a or 3b CKD [facility])   [place] [facility] Vicci Duwaine SQUIBB, DO       Future Appointments             In 1 week Fernand Denyse LABOR, MD [facility]   In 3 months Vicci, Duwaine SQUIBB, DO [place], [facility]

## 2023-01-25 ENCOUNTER — Other Ambulatory Visit: Payer: Self-pay

## 2023-01-25 NOTE — Progress Notes (Signed)
   01/25/2023  Patient ID: [name], male   DOB: [birth year], 58 y.o.   MRN: [id number]  S/O Telephone visit to follow-up on management of T2DM  Diabetes Management Plan -Current medications:  Ozempic  0.25mg  weekly, metformin  1000mg  BID, Jardiance  25mg  daily -Patient recently stopped Januvia  100mg  and restarted Ozempic  at 0.25mg  weekly.  He has had 3 doses so far and is tolerating well. -Last A1c 7.7% -BG averaging 180-200 per patient -Does not endorse any s/sx of hypoglycemia  A/P  Diabetes Management Plan -Continue current regimen at this time; increase to Ozempic  0.5mg  weekly starting 1/20 -Educated patient that it will take some time for BG to decrease as we are titrating Ozempic  dose; goal to be at 1mg  by 2/17, which is the dose he was previously taking when medication was covered by insurance  Follow-up:  2/11  [name], PharmD, DPLA

## 2023-02-02 ENCOUNTER — Telehealth: Payer: Self-pay | Admitting: Family Medicine

## 2023-02-02 ENCOUNTER — Other Ambulatory Visit: Payer: Self-pay | Admitting: Family Medicine

## 2023-02-02 NOTE — Telephone Encounter (Signed)
 Pt is calling in because he says he went to the pharmacy and was told that he no longer had refills on his medications and he wanted to make sure he would be able to get additional refills on his medications because he usually goes through the pharmacy. Please follow up with pt.

## 2023-02-03 ENCOUNTER — Other Ambulatory Visit: Payer: Self-pay

## 2023-02-03 MED ORDER — AYR SALINE NASAL NA GEL: 1.0000 | Freq: Four times a day (QID) | NASAL | 6 refills | Status: AC | PRN

## 2023-02-03 NOTE — Telephone Encounter (Signed)
Requested medication (s) are due for refill today: Yes  Requested medication (s) are on the active medication list: Yes  Last refill:  01/06/22  Future visit scheduled: Yes  Notes to clinic:  Manual review.    Requested Prescriptions  Pending Prescriptions Disp Refills   saline (AYR) GEL [Pharmacy Med Name: AYR SALINE NASAL GEL] 14.1 g 6    Sig: Place 1 Application into both nostrils every 6 (six) hours as needed.     Off-Protocol Failed - 02/03/2023 10:08 AM      Failed - Medication not assigned to a protocol, review manually.      Passed - Valid encounter within last 12 months    Recent Outpatient Visits           1 week ago Type 2 diabetes mellitus with stage 3 chronic kidney disease, without long-term current use of insulin, unspecified whether stage 3a or 3b CKD (HCC)   [place] [facility] [place], [name], DO   3 months ago Routine general medical examination at a health care facility   [facility], [name], DO   4 months ago Bilateral lower extremity edema   [facility], [name], NP   5 months ago Bilateral lower extremity edema   [place] [facility], [name], NP   6 months ago Type 2 diabetes mellitus with stage 3 chronic kidney disease, without long-term current use of insulin, unspecified whether stage 3a or 3b CKD [facility])   [place] [facility] [place], Oralia Rud, DO       Future Appointments             Tomorrow [name], MD [facility]   In 3 months [name], DO [facility]

## 2023-02-03 NOTE — Telephone Encounter (Signed)
He got 6 months of medicine sent in in October- he has a follow up in April. Should have enough to make it to his appointment.

## 2023-02-04 ENCOUNTER — Encounter: Payer: Self-pay | Admitting: Cardiovascular Disease

## 2023-02-04 ENCOUNTER — Ambulatory Visit: Payer: Medicaid Other | Admitting: Cardiovascular Disease

## 2023-02-04 VITALS — BP 110/70 | HR 83 | Ht 68.0 in | Wt 253.0 lb

## 2023-02-04 DIAGNOSIS — I2585 Chronic coronary microvascular dysfunction: Secondary | ICD-10-CM | POA: Diagnosis not present

## 2023-02-04 DIAGNOSIS — E1159 Type 2 diabetes mellitus with other circulatory complications: Secondary | ICD-10-CM | POA: Diagnosis not present

## 2023-02-04 DIAGNOSIS — N183 Chronic kidney disease, stage 3 unspecified: Secondary | ICD-10-CM

## 2023-02-04 DIAGNOSIS — E1169 Type 2 diabetes mellitus with other specified complication: Secondary | ICD-10-CM | POA: Diagnosis not present

## 2023-02-04 DIAGNOSIS — E669 Obesity, unspecified: Secondary | ICD-10-CM | POA: Diagnosis not present

## 2023-02-04 DIAGNOSIS — E1122 Type 2 diabetes mellitus with diabetic chronic kidney disease: Secondary | ICD-10-CM

## 2023-02-04 DIAGNOSIS — E782 Mixed hyperlipidemia: Secondary | ICD-10-CM | POA: Diagnosis not present

## 2023-02-04 DIAGNOSIS — I152 Hypertension secondary to endocrine disorders: Secondary | ICD-10-CM | POA: Diagnosis not present

## 2023-02-04 DIAGNOSIS — I340 Nonrheumatic mitral (valve) insufficiency: Secondary | ICD-10-CM

## 2023-02-04 DIAGNOSIS — I10 Essential (primary) hypertension: Secondary | ICD-10-CM

## 2023-02-04 MED ORDER — ICOSAPENT ETHYL 1 G PO CAPS: 2.0000 g | ORAL_CAPSULE | Freq: Two times a day (BID) | ORAL | 1 refills | Status: DC

## 2023-02-04 MED ORDER — METOPROLOL TARTRATE 100 MG PO TABS: 100.0000 mg | ORAL_TABLET | Freq: Two times a day (BID) | ORAL | 1 refills | Status: DC

## 2023-02-04 MED ORDER — ATORVASTATIN CALCIUM 80 MG PO TABS: 80.0000 mg | ORAL_TABLET | Freq: Every day | ORAL | 1 refills | Status: DC

## 2023-02-04 NOTE — Progress Notes (Signed)
Cardiology Office Note   Date:  02/04/2023   ID:  Joshua Lee, DOB October 04, 1965, MRN [id number]  PCP:  [name], DO  Cardiologist:  Adrian Blackwater, MD      History of Present Illness: Joshua Lee is a 58 y.o. male who presents for  Chief Complaint  Patient presents with   Follow-up    6 months    No chest pain or SOB      Past Medical History:  Diagnosis Date   Diabetes mellitus without complication (HCC)    GERD (gastroesophageal reflux disease)    Hyperlipidemia    Hypertension    Kidney stones    Plantar fascial fibromatosis      Past Surgical History:  Procedure Laterality Date   CHOLECYSTECTOMY  2006   GALLBLADDER SURGERY     KIDNEY STONE SURGERY     KNEE ARTHROSCOPY     TOTAL HIP ARTHROPLASTY Right 07/08/2015   Procedure: TOTAL HIP ARTHROPLASTY ANTERIOR APPROACH;  Surgeon: Kennedy Bucker, MD;  Location: [facility] ORS;  Service: Orthopedics;  Laterality: Right;     Current Outpatient Medications  Medication Sig Dispense Refill   Accu-Chek Softclix Lancets lancets Use to check blood glucose two times daily 100 each 12   acetaminophen (TYLENOL) 650 MG CR tablet Take 650-1,950 mg by mouth every 8 (eight) hours as needed for pain.     amLODipine (NORVASC) 2.5 MG tablet Take 1 tablet (2.5 mg total) by mouth daily. 90 tablet 1   aspirin 81 MG tablet Take 81 mg by mouth daily.     benazepril (LOTENSIN) 40 MG tablet Take 1 tablet (40 mg total) by mouth daily. 90 tablet 1   Blood Glucose Monitoring Suppl (ACCU-CHEK GUIDE ME) w/Device KIT Use to check blood glucose two times daily 1 kit 0   Coenzyme Q-10 100 MG capsule Take 100 mg by mouth daily.     cyclobenzaprine (FLEXERIL) 10 MG tablet Take 1 tablet (10 mg total) by mouth at bedtime. 30 tablet 1   empagliflozin (JARDIANCE) 25 MG TABS tablet TAKE 1 TABLET BY MOUTH EVERY DAY BEFORE BREAKFAST 30 tablet 2   fluticasone (FLONASE) 50 MCG/ACT nasal spray Place 1 spray into both nostrils 2 (two) times daily.  16 g 12   gabapentin (NEURONTIN) 300 MG capsule Take 300 mg by mouth 3 (three) times daily as needed.     Garlic 1000 MG CAPS Take 1,000 mg by mouth daily.     glucose blood (ACCU-CHEK GUIDE TEST) test strip 1 each by Other route 2 (two) times daily. 200 each 3   metFORMIN (GLUCOPHAGE) 500 MG tablet TAKE 2 TABLETS(1000 MG) BY MOUTH TWICE DAILY WITH A MEAL 360 tablet 1   naproxen (NAPROSYN) 500 MG tablet TAKE 1 TABLET BY MOUTH 2 TIMES DAILY WITH A MEAL. 60 tablet 0   Potassium Citrate 15 MEQ (1620 MG) TBCR Take 15 mEq by mouth 2 (two) times daily. 2 tab QAM, 1 tab QPM  1   saline (AYR) GEL Place 1 Application into both nostrils every 6 (six) hours as needed. 14.1 g 6   [START ON 03/04/2023] Semaglutide, 1 MG/DOSE, 4 MG/3ML SOPN Inject 1 mg as directed once a week. 9 mL 0   Semaglutide,0.25 or 0.5MG /DOS, (OZEMPIC, 0.25 OR 0.5 MG/DOSE,) 2 MG/3ML SOPN INJECT 0.25MG  INTO THE SKIN ONE TIME PER WEEK 9 mL 0   tamsulosin (FLOMAX) 0.4 MG CAPS capsule TAKE 1 CAPSULE BY MOUTH EVERY DAY 90 capsule 1  traMADol (ULTRAM) 50 MG tablet Take 50 mg by mouth every 12 (twelve) hours as needed.     atorvastatin (LIPITOR) 80 MG tablet Take 1 tablet (80 mg total) by mouth daily. 90 tablet 1   diclofenac Sodium (VOLTAREN) 1 % GEL Apply 4 g topically 4 (four) times daily. (Patient not taking: Reported on 02/04/2023) 350 g 2   icosapent Ethyl (VASCEPA) 1 g capsule Take 2 capsules (2 g total) by mouth 2 (two) times daily. 360 capsule 1   metoprolol tartrate (LOPRESSOR) 100 MG tablet Take 1 tablet (100 mg total) by mouth 2 (two) times daily. 180 tablet 1   No current facility-administered medications for this visit.    Allergies:   Penicillins    Social History:   reports that he has never smoked. His smokeless tobacco use includes snuff. He reports that he does not currently use alcohol. He reports that he does not use drugs.   Family History:  family history includes Diabetes in his maternal uncle.    ROS:      Review of Systems  Constitutional: Negative.   HENT: Negative.    Eyes: Negative.   Respiratory: Negative.    Gastrointestinal: Negative.   Genitourinary: Negative.   Musculoskeletal: Negative.   Skin: Negative.   Neurological: Negative.   Endo/Heme/Allergies: Negative.   Psychiatric/Behavioral: Negative.    All other systems reviewed and are negative.     All other systems are reviewed and negative.    PHYSICAL EXAM: VS:  BP 110/70   Pulse 83   Ht 5\' 8"  (1.727 m)   Wt 253 lb (114.8 kg)   SpO2 95%   BMI 38.47 kg/m  , BMI Body mass index is 38.47 kg/m. Last weight:  Wt Readings from Last 3 Encounters:  02/04/23 253 lb (114.8 kg)  01/21/23 251 lb 12.8 oz (114.2 kg)  10/21/22 246 lb (111.6 kg)     Physical Exam Vitals reviewed.  Constitutional:      Appearance: Normal appearance. He is normal weight.  HENT:     Head: Normocephalic.     Nose: Nose normal.     Mouth/Throat:     Mouth: Mucous membranes are moist.  Eyes:     Pupils: Pupils are equal, round, and reactive to light.  Cardiovascular:     Rate and Rhythm: Normal rate and regular rhythm.     Pulses: Normal pulses.     Heart sounds: Normal heart sounds.  Pulmonary:     Effort: Pulmonary effort is normal.  Abdominal:     General: Abdomen is flat. Bowel sounds are normal.  Musculoskeletal:        General: Normal range of motion.     Cervical back: Normal range of motion.  Skin:    General: Skin is warm.  Neurological:     General: No focal deficit present.     Mental Status: He is alert.  Psychiatric:        Mood and Affect: Mood normal.       EKG:   Recent Labs: 10/21/2022: ALT 32; BUN 20; Creatinine, Ser 0.85; Hemoglobin 13.7; Platelets 231; Potassium 4.5; Sodium 137; TSH 2.390    Lipid Panel    Component Value Date/Time   CHOL 113 10/21/2022 1126   CHOL 108 08/29/2018 1045   TRIG 136 10/21/2022 1126   TRIG 221 (H) 08/29/2018 1045   HDL 30 (L) 10/21/2022 1126   CHOLHDL 6.5 (H)  02/09/2017 1002   VLDL 44 (H) 08/29/2018 1045  LDLCALC 59 10/21/2022 1126      Other studies Reviewed: Additional studies/ records that were reviewed today include:  Review of the above records demonstrates:       No data to display            ASSESSMENT AND PLAN:    ICD-10-CM   1. Obesity, diabetes, and hypertension syndrome (HCC)  E11.69 icosapent Ethyl (VASCEPA) 1 g capsule   E66.9 metoprolol tartrate (LOPRESSOR) 100 MG tablet   E11.59 atorvastatin (LIPITOR) 80 MG tablet   I15.2     2. Primary hypertension  I10 icosapent Ethyl (VASCEPA) 1 g capsule    metoprolol tartrate (LOPRESSOR) 100 MG tablet    atorvastatin (LIPITOR) 80 MG tablet   stable    3. Mixed hyperlipidemia  E78.2 icosapent Ethyl (VASCEPA) 1 g capsule    metoprolol tartrate (LOPRESSOR) 100 MG tablet    atorvastatin (LIPITOR) 80 MG tablet    4. Nonrheumatic mitral valve regurgitation  I34.0 icosapent Ethyl (VASCEPA) 1 g capsule    metoprolol tartrate (LOPRESSOR) 100 MG tablet    atorvastatin (LIPITOR) 80 MG tablet    5. Chronic coronary microvascular dysfunction  I25.85 icosapent Ethyl (VASCEPA) 1 g capsule    metoprolol tartrate (LOPRESSOR) 100 MG tablet    atorvastatin (LIPITOR) 80 MG tablet   No chest pain    6. Type 2 diabetes mellitus with stage 3 chronic kidney disease, without long-term current use of insulin, unspecified whether stage 3a or 3b CKD (HCC)  E11.22 icosapent Ethyl (VASCEPA) 1 g capsule   N18.30 metoprolol tartrate (LOPRESSOR) 100 MG tablet    atorvastatin (LIPITOR) 80 MG tablet       Problem List Items Addressed This Visit       Cardiovascular and Mediastinum   Hypertension   Relevant Medications   icosapent Ethyl (VASCEPA) 1 g capsule   metoprolol tartrate (LOPRESSOR) 100 MG tablet   atorvastatin (LIPITOR) 80 MG tablet   Obesity, diabetes, and hypertension syndrome (HCC) - Primary   Relevant Medications   icosapent Ethyl (VASCEPA) 1 g capsule   metoprolol tartrate  (LOPRESSOR) 100 MG tablet   atorvastatin (LIPITOR) 80 MG tablet     Endocrine   Type 2 diabetes mellitus with stage 3 chronic kidney disease, without long-term current use of insulin (HCC)   Relevant Medications   icosapent Ethyl (VASCEPA) 1 g capsule   metoprolol tartrate (LOPRESSOR) 100 MG tablet   atorvastatin (LIPITOR) 80 MG tablet     Other   Hyperlipidemia   Relevant Medications   icosapent Ethyl (VASCEPA) 1 g capsule   metoprolol tartrate (LOPRESSOR) 100 MG tablet   atorvastatin (LIPITOR) 80 MG tablet   Other Visit Diagnoses       Nonrheumatic mitral valve regurgitation       Relevant Medications   icosapent Ethyl (VASCEPA) 1 g capsule   metoprolol tartrate (LOPRESSOR) 100 MG tablet   atorvastatin (LIPITOR) 80 MG tablet     Chronic coronary microvascular dysfunction       No chest pain   Relevant Medications   icosapent Ethyl (VASCEPA) 1 g capsule   metoprolol tartrate (LOPRESSOR) 100 MG tablet   atorvastatin (LIPITOR) 80 MG tablet          Disposition:   Return in about 3 months (around 05/05/2023).    Total time spent: 30 minutes  Signed,  Adrian Blackwater, MD  02/04/2023 10:14 AM    [facility]

## 2023-02-08 ENCOUNTER — Ambulatory Visit: Payer: Self-pay | Admitting: *Deleted

## 2023-02-08 LAB — HM DIABETES EYE EXAM

## 2023-02-08 NOTE — Patient Instructions (Signed)
Visit Information  Thank you for taking time to visit with me today. Please don't hesitate to contact me if I can be of assistance to you before our next scheduled telephone appointment.  Following are the goals we discussed today:  Monitor blood pressure in a regular basis, maybe twice a week. Monitor blood sugar daily  Remember yearly eye and foot exams. Follow diabetic diet, notify MD if you have symptoms from Ozempic.   Our next appointment is by telephone on 4/21  Please call the care guide team at [phone] if you need to cancel or reschedule your appointment.   Please call the Suicide and Crisis Lifeline: 988 call the [place] National Suicide Prevention Lifeline: 332-720-2039 or TTY: (930) 584-0540 TTY (815)454-7849) to talk to a trained counselor call 1-800-273-TALK (toll free, 24 hour hotline) call 911 if you are experiencing a Mental Health or Behavioral Health Crisis or need someone to talk to.  The patient verbalized understanding of instructions, educational materials, and care plan provided today and agreed to receive a mailed copy of patient instructions, educational materials, and care plan.   The patient has been provided with contact information for the care management team and has been advised to call with any health related questions or concerns.   Rodney Langton, RN, MSN, CCM [facility], [facility] RN Care Coordinator Direct Dial: [phone] / Main [phone] Fax [phone] Email: [email or web address]: [email or web address]

## 2023-02-08 NOTE — Patient Outreach (Signed)
  Care Coordination   Follow Up Visit Note   02/08/2023 Name: ISAIAS BOZARD MRN: [id number] DOB: 02-Feb-1965  Marzetta Board Mestre is a 58 y.o. year old male who sees [name], DO for primary care. I spoke with  Marzetta Board Simer by phone today.  What matters to the patients health and wellness today?  Will complete eye exam today, working to decrease A1C again, increased slightly.  Denies any urgent concerns, encouraged to contact this care manager with questions.     Goals Addressed             This Visit's Progress    COMPLETED: RNCM: Effective Management of DM   On track    Care Coordination Interventions:   Provided education to patient about basic DM disease process Reviewed medications with patient and discussed importance of medication adherence Counseled on importance of regular laboratory monitoring as prescribed Discussed plans with patient for ongoing care management follow up and provided patient with direct contact information for care management team Advised patient, providing education and rationale, to check cbg as directed and record, calling pcp for findings outside established parameters Review of patient status, including review of consultants reports, relevant laboratory and other test results, and medications completed        SDOH assessments and interventions completed:  No     Care Coordination Interventions:  Yes, provided   Interventions Today    Flowsheet Row Most Recent Value  Chronic Disease   Chronic disease during today's visit Diabetes, Chronic Kidney Disease/End Stage Renal Disease (ESRD), Hypertension (HTN)  General Interventions   General Interventions Discussed/Reviewed General Interventions Reviewed, Labs, Durable Medical Equipment (DME), Doctor Visits, Annual Eye Exam  [Completed PCP and cardiology visits since last outreach, report labs were fine, no change in plan of care]  Labs Hgb A1c every 3 months  [Aware that A1C increased  slightly from 7.3 to 7.7]  Doctor Visits Discussed/Reviewed Doctor Visits Reviewed, PCP, Specialist  [Reviewed upcoming: pharmacy team 2/11, ortho 3/27, PCP 4/16, cardiology 4/17]  Durable Medical Equipment (DME) Glucomoter, BP Cuff  PCP/Specialist Visits [facility] with follow-up visit  [Has eye exam today]  Education Interventions   Education Provided Provided Education  Provided Verbal Education On Blood Sugar Monitoring, Labs, Medication, When to see the doctor  [Monitoring blood sugar daily, today was 127, will monitor BP on regular basis as well. Restarted Ozempic, will increase to 0.5mg  today, denies any side effects]  Labs Reviewed Hgb A1c  [Re-educated on goal of less than 7]  Pharmacy Interventions   Pharmacy Dicussed/Reviewed Pharmacy Topics Reviewed, Affording Medications  [Remains active with pharmacy team for medication assistance]        Follow up plan: Follow up call scheduled for 4/21    Encounter Outcome:  Patient Visit Completed   Rodney Langton, RN, MSN, CCM [place]  [facility], [facility] RN Care Coordinator Direct Dial: [phone] / Main [phone] Fax [phone] Email: [email or web address]: [email or web address]

## 2023-02-16 DIAGNOSIS — M9903 Segmental and somatic dysfunction of lumbar region: Secondary | ICD-10-CM | POA: Diagnosis not present

## 2023-02-16 DIAGNOSIS — M4306 Spondylolysis, lumbar region: Secondary | ICD-10-CM | POA: Diagnosis not present

## 2023-02-16 DIAGNOSIS — M5416 Radiculopathy, lumbar region: Secondary | ICD-10-CM | POA: Diagnosis not present

## 2023-02-16 DIAGNOSIS — M5417 Radiculopathy, lumbosacral region: Secondary | ICD-10-CM | POA: Diagnosis not present

## 2023-02-22 DIAGNOSIS — M4306 Spondylolysis, lumbar region: Secondary | ICD-10-CM | POA: Diagnosis not present

## 2023-02-22 DIAGNOSIS — L280 Lichen simplex chronicus: Secondary | ICD-10-CM | POA: Diagnosis not present

## 2023-02-22 DIAGNOSIS — M9903 Segmental and somatic dysfunction of lumbar region: Secondary | ICD-10-CM | POA: Diagnosis not present

## 2023-02-22 DIAGNOSIS — M5417 Radiculopathy, lumbosacral region: Secondary | ICD-10-CM | POA: Diagnosis not present

## 2023-02-22 DIAGNOSIS — M5416 Radiculopathy, lumbar region: Secondary | ICD-10-CM | POA: Diagnosis not present

## 2023-03-01 ENCOUNTER — Other Ambulatory Visit: Payer: Self-pay

## 2023-03-01 NOTE — Progress Notes (Signed)
   03/01/2023  Patient ID: Joshua Lee, male   DOB: December 05, 1965, 58 y.o.   MRN: [id number]  Subjective/objective Telephone visit to follow-up on management of type 2 diabetes  Diabetes management plan -Current medications: Jardiance 25 mg daily, metformin 1000 mg 2 times daily, Ozempic 0.5 mg weekly -Patient will take his fourth week of Ozempic 0.5 mg this week, and he endorses tolerating this dosage well with no adverse GI side effects -He does monitor fasting blood glucose regularly and this average for the last 2 weeks is 135 -A1c in January was 7.7% -Patient does not endorse any signs or symptoms of hypoglycemia  Assessment/plan  Diabetes management plan -Increase to Ozempic 1 mg with next week's dose -Continue metformin 1000 mg twice daily and Jardiance 25 mg daily -Continue to monitor and record fasting blood glucose daily -Patient sees PCP for follow-up 4/17 and will be due for A1c at this time  Follow-up: 12 weeks  Lenna Gilford, PharmD, DPLA

## 2023-03-08 ENCOUNTER — Telehealth: Payer: Self-pay

## 2023-03-08 NOTE — Progress Notes (Signed)
   03/08/2023  Patient ID: Joshua Lee, male   DOB: [birth year], 58 y.o.   MRN: [id number]  Returning missed call/voicemail from patient.  This week he increases to Ozempic 1mg  weekly, but he has medication remaining in his last 0.25/0.5mg  pen.  Patient is inquiring if he can do two injections of 0.5mg  this week to equal 1mg  to not waste the medication.  I informed him that would be completely fine as long as he is sure 2 doses of 0.5mg  remain in the pen.  Lenna Gilford, PharmD, DPLA

## 2023-03-10 ENCOUNTER — Encounter: Payer: Self-pay | Admitting: Intensive Care

## 2023-03-10 ENCOUNTER — Emergency Department
Admission: EM | Admit: 2023-03-10 | Discharge: 2023-03-10 | Disposition: A | Discharge status: Home / Self Care | Payer: Medicaid Other | Attending: Emergency Medicine | Admitting: Emergency Medicine

## 2023-03-10 ENCOUNTER — Emergency Department: Payer: Medicaid Other

## 2023-03-10 ENCOUNTER — Other Ambulatory Visit: Payer: Self-pay

## 2023-03-10 DIAGNOSIS — R0789 Other chest pain: Secondary | ICD-10-CM | POA: Insufficient documentation

## 2023-03-10 DIAGNOSIS — E1122 Type 2 diabetes mellitus with diabetic chronic kidney disease: Secondary | ICD-10-CM | POA: Insufficient documentation

## 2023-03-10 DIAGNOSIS — N189 Chronic kidney disease, unspecified: Secondary | ICD-10-CM | POA: Diagnosis not present

## 2023-03-10 DIAGNOSIS — R7989 Other specified abnormal findings of blood chemistry: Secondary | ICD-10-CM | POA: Diagnosis not present

## 2023-03-10 DIAGNOSIS — I129 Hypertensive chronic kidney disease with stage 1 through stage 4 chronic kidney disease, or unspecified chronic kidney disease: Secondary | ICD-10-CM | POA: Insufficient documentation

## 2023-03-10 DIAGNOSIS — R079 Chest pain, unspecified: Secondary | ICD-10-CM

## 2023-03-10 LAB — HEPATIC FUNCTION PANEL
ALT: 28 U/L (ref 0–44)
AST: 26 U/L (ref 15–41)
Albumin: 3.8 g/dL (ref 3.5–5.0)
Alkaline Phosphatase: 43 U/L (ref 38–126)
Bilirubin, Direct: 0.1 mg/dL (ref 0.0–0.2)
Indirect Bilirubin: 0.7 mg/dL (ref 0.3–0.9)
Total Bilirubin: 0.8 mg/dL (ref 0.0–1.2)
Total Protein: 7.4 g/dL (ref 6.5–8.1)

## 2023-03-10 LAB — CBC
HCT: 44.6 % (ref 39.0–52.0)
Hemoglobin: 14.1 g/dL (ref 13.0–17.0)
MCH: 27 pg (ref 26.0–34.0)
MCHC: 31.6 g/dL (ref 30.0–36.0)
MCV: 85.4 fL (ref 80.0–100.0)
Platelets: 236 10*3/uL (ref 150–400)
RBC: 5.22 MIL/uL (ref 4.22–5.81)
RDW: 15.3 % (ref 11.5–15.5)
WBC: 10.3 10*3/uL (ref 4.0–10.5)
nRBC: 0 % (ref 0.0–0.2)

## 2023-03-10 LAB — BASIC METABOLIC PANEL
Anion gap: 13 (ref 5–15)
BUN: 13 mg/dL (ref 6–20)
CO2: 26 mmol/L (ref 22–32)
Calcium: 9.3 mg/dL (ref 8.9–10.3)
Chloride: 97 mmol/L — ABNORMAL LOW (ref 98–111)
Creatinine, Ser: 0.9 mg/dL (ref 0.61–1.24)
GFR, Estimated: >60 mL/min (ref >60)
Glucose, Bld: 247 mg/dL — ABNORMAL HIGH (ref 70–99)
Potassium: 4 mmol/L (ref 3.5–5.1)
Sodium: 136 mmol/L (ref 135–145)

## 2023-03-10 LAB — TROPONIN I (HIGH SENSITIVITY)
Troponin I (High Sensitivity): 3 ng/L (ref <18)
Troponin I (High Sensitivity): 3 ng/L (ref <18)

## 2023-03-10 LAB — LIPASE, BLOOD: Lipase: 58 U/L — ABNORMAL HIGH (ref 11–51)

## 2023-03-10 MED ORDER — ALUM & MAG HYDROXIDE-SIMETH 200-200-20 MG/5ML PO SUSP
30.0000 mL | Freq: Once | ORAL | Status: AC
  Administered 2023-03-10: 30 mL via ORAL
  Filled 2023-03-10: qty 30

## 2023-03-10 NOTE — ED Provider Notes (Signed)
 [facility] Provider Note    Event Date/Time   First MD Initiated Contact with Patient 03/10/23 1531     (approximate)   History   Chest Pain   HPI  Joshua Lee is a 58 year old male with history of HTN, CKD, T2DM presenting to the ER for evaluation of chest pain.  Reports that earlier today he ate a chili hotdog and shortly after had onset of chest pain described as a sharp pain in the center of his chest and left side.  No shortness of breath.  Pain is improved currently.  Did try taking Tums without significant benefit.  No vomiting or diarrhea.     Physical Exam   Triage Vital Signs: ED Triage Vitals  Encounter Vitals Group     BP 03/10/23 1323 (!) 146/76     Systolic BP Percentile --      Diastolic BP Percentile --      Pulse Rate 03/10/23 1323 90     Resp 03/10/23 1323 18     Temp 03/10/23 1323 97.9 F (36.6 C)     Temp Source 03/10/23 1323 Oral     SpO2 03/10/23 1323 97 %     Weight 03/10/23 1321 240 lb (108.9 kg)     Height 03/10/23 1321 5\' 9"  (1.753 m)     Head Circumference --      Peak Flow --      Pain Score 03/10/23 1321 0     Pain Loc --      Pain Education --      Exclude from Growth Chart --     Most recent vital signs: Vitals:   03/10/23 1323 03/10/23 1733  BP: (!) 146/76 116/78  Pulse: 90 84  Resp: 18 18  Temp: 97.9 F (36.6 C)   SpO2: 97% 100%     General: Awake, interactive  CV:  Regular rate, good peripheral perfusion.  Resp:  Unlabored respirations, lungs clear to auscultation Chest wall: Mild reproducible tenderness over the left anterior chest wall Abd:  Nondistended, soft, mild epigastric tenderness, remainder of abdomen nontender Neuro:  Symmetric facial movement, fluid speech   ED Results / Procedures / Treatments   Labs (all labs ordered are listed, but only abnormal results are displayed) Labs Reviewed  BASIC METABOLIC PANEL - Abnormal; Notable for the following components:      Result  Value   Chloride 97 (*)    Glucose, Bld 247 (*)    All other components within normal limits  LIPASE, BLOOD - Abnormal; Notable for the following components:   Lipase 58 (*)    All other components within normal limits  CBC  HEPATIC FUNCTION PANEL  TROPONIN I (HIGH SENSITIVITY)  TROPONIN I (HIGH SENSITIVITY)     EKG EKG independently reviewed interpreted by myself (ER attending) demonstrates:  EKG demonstrates normal sinus rhythm rate of 86, PR 158, QRS 104, QTc 445, no acute ST changes  RADIOLOGY Imaging independently reviewed and interpreted by myself demonstrates:  CXR without focal consolidation  PROCEDURES:  Critical Care performed: No  Procedures   MEDICATIONS ORDERED IN ED: Medications  alum & mag hydroxide-simeth (MAALOX/MYLANTA) 200-200-20 MG/5ML suspension 30 mL (30 mLs Oral Given 03/10/23 1737)     IMPRESSION / MDM / ASSESSMENT AND PLAN / ED COURSE  I reviewed the triage vital signs and the nursing notes.  Differential diagnosis includes, but is not limited to, pneumonia, pneumothorax, ACS, gastritis, pancreatitis, other GI related chest pain  Patient's presentation is most consistent with acute presentation with potential threat to life or bodily function.  58 year old male presenting with chest pain, resolved at time of my initial evaluation.  Stable vitals.  Labs are in triage reassuring including negative troponin x 2.  On my evaluation, clinical history seems more consistent with GI related chest pain.  Hepatic functional panel and lipase added on.  Lipase slightly elevated last that  3 times the upper limit of normal, not consistent with pancreatitis.  Patient given a GI cocktail with some improvement.  He is comfortable discharge home.  Strict return precautions provided.  Patient discharged stable condition.      FINAL CLINICAL IMPRESSION(S) / ED DIAGNOSES   Final diagnoses:  Nonspecific chest pain     Rx / DC Orders   ED Discharge Orders      None        Note:  This document was prepared using Dragon voice recognition software and may include unintentional dictation errors.   Trinna Post, MD 03/10/23 [phone]

## 2023-03-10 NOTE — ED Triage Notes (Signed)
 Patient c/o left sided, sharp chest pain that started today. Radiation to right arm

## 2023-03-10 NOTE — Discharge Instructions (Addendum)
 You were seen in the Emergency Department today for evaluation of your chest pain. Fortunately, your labs, EKG, and chest x-Jeanell Mangan were overall reassuring against a emergency cause for your pain. Please follow-up with your primary doctor for reevaluation. Return to the ER for any new or worsening symptoms including worsening chest pain, difficulty breathing, or any other new or concerning symptoms that you believe warrants immediate attention.

## 2023-03-16 DIAGNOSIS — M5417 Radiculopathy, lumbosacral region: Secondary | ICD-10-CM | POA: Diagnosis not present

## 2023-03-16 DIAGNOSIS — M5416 Radiculopathy, lumbar region: Secondary | ICD-10-CM | POA: Diagnosis not present

## 2023-03-16 DIAGNOSIS — M4306 Spondylolysis, lumbar region: Secondary | ICD-10-CM | POA: Diagnosis not present

## 2023-03-16 DIAGNOSIS — M9903 Segmental and somatic dysfunction of lumbar region: Secondary | ICD-10-CM | POA: Diagnosis not present

## 2023-03-31 ENCOUNTER — Telehealth: Payer: Self-pay | Admitting: Family Medicine

## 2023-03-31 DIAGNOSIS — Z1283 Encounter for screening for malignant neoplasm of skin: Secondary | ICD-10-CM

## 2023-03-31 DIAGNOSIS — L089 Local infection of the skin and subcutaneous tissue, unspecified: Secondary | ICD-10-CM | POA: Diagnosis not present

## 2023-03-31 NOTE — Telephone Encounter (Signed)
 There is no message in this encounter

## 2023-03-31 NOTE — Addendum Note (Signed)
 Addended by: Ginette Pitman on: 03/31/2023 05:10 PM   Modules accepted: Orders

## 2023-03-31 NOTE — Telephone Encounter (Signed)
 Called patient to get clarification from a previous message patient states  that he was told by the dermatologist that he need another referral for the next 6 months to a year. Please advise

## 2023-03-31 NOTE — Telephone Encounter (Signed)
Sent telephone message

## 2023-04-01 NOTE — Addendum Note (Signed)
 Addended by: [name] on: 04/01/2023 08:17 AM   Modules accepted: Orders

## 2023-04-18 ENCOUNTER — Other Ambulatory Visit: Payer: Self-pay | Admitting: Family Medicine

## 2023-04-18 NOTE — Telephone Encounter (Signed)
 Copied from CRM [id number]. Topic: Clinical - Medication Refill >> Apr 18, 2023 12:42 PM Patsy Lager T wrote: Most Recent Primary Care Visit:  Provider: Olevia Perches P  Department: [facility]  Visit Type: OFFICE VISIT  Date: 01/21/2023  Medication: fluticasone (FLONASE) 50 MCG/ACT nasal spray  Has the patient contacted their pharmacy? Yes   Is this the correct pharmacy for this prescription? Yes If no, delete pharmacy and type the correct one.  This is the patient's preferred pharmacy:  [id number] - [address] [place] [address] Phone: [phone] Fax: [phone]   Has the prescription been filled recently? Yes  Is the patient out of the medication? Yes  Has the patient been seen for an appointment in the last year OR does the patient have an upcoming appointment? Yes  Can we respond through MyChart? No  Agent: Please be advised that Rx refills may take up to 3 business days. We ask that you follow-up with your pharmacy.

## 2023-04-19 ENCOUNTER — Other Ambulatory Visit: Payer: Self-pay | Admitting: Family Medicine

## 2023-04-19 NOTE — Telephone Encounter (Signed)
 Copied from CRM [id number]. Topic: Clinical - Medication Refill >> Apr 18, 2023 12:42 PM Patsy Lager T wrote: Most Recent Primary Care Visit:  Provider: Olevia Perches P  Department: [facility]  Visit Type: OFFICE VISIT  Date: 01/21/2023  Medication: fluticasone (FLONASE) 50 MCG/ACT nasal spray  Has the patient contacted their pharmacy? Yes   Is this the correct pharmacy for this prescription? Yes If no, delete pharmacy and type the correct one.  This is the patient's preferred pharmacy:  [id number] [address] [facility] - [address] [place] [address] Phone: [phone] Fax: [phone]   Has the prescription been filled recently? Yes  Is the patient out of the medication? Yes  Has the patient been seen for an appointment in the last year OR does the patient have an upcoming appointment? Yes  Can we respond through MyChart? No  Agent: Please be advised that Rx refills may take up to 3 business days. We ask that you follow-up with your pharmacy. >> Apr 19, 2023  8:30 AM Abundio Miu S wrote: Patient calling to check the status of medication refill. Advised patient that med request can take up to 3 business days. Patient request medication refill as soon as possible due to allergies.

## 2023-04-20 DIAGNOSIS — B356 Tinea cruris: Secondary | ICD-10-CM | POA: Diagnosis not present

## 2023-04-20 NOTE — Telephone Encounter (Signed)
 Duplicate request, last refill 04/20/23.  Requested Prescriptions  Pending Prescriptions Disp Refills   fluticasone (FLONASE) 50 MCG/ACT nasal spray 16 g 12    Sig: Place 1 spray into both nostrils 2 (two) times daily.     Ear, Nose, and Throat: Nasal Preparations - Corticosteroids Failed - 04/20/2023 12:11 PM      Failed - Valid encounter within last 12 months    Recent Outpatient Visits   None     Future Appointments             In 2 weeks [name], DO [place] [facility]   In 2 weeks [name], MD [facility]

## 2023-04-20 NOTE — Telephone Encounter (Signed)
 Last OV 01/21/23 with protocol.  Requested Prescriptions  Pending Prescriptions Disp Refills   fluticasone (FLONASE) 50 MCG/ACT nasal spray [Pharmacy Med Name: FLUTICASONE PROP 50 MCG SPRAY] 48 mL 0    Sig: PLACE 1 SPRAY INTO BOTH NOSTRILS 2 (TWO) TIMES DAILY     Ear, Nose, and Throat: Nasal Preparations - Corticosteroids Failed - 04/20/2023  8:39 AM      Failed - Valid encounter within last 12 months    Recent Outpatient Visits   None     Future Appointments             In 2 weeks [name], DO [place] [facility], [facility]   In 2 weeks [name], MD [facility]

## 2023-04-20 NOTE — Telephone Encounter (Signed)
 Duplicate request, refilled today  Requested Prescriptions  Pending Prescriptions Disp Refills   fluticasone (FLONASE) 50 MCG/ACT nasal spray 16 g 12    Sig: Place 1 spray into both nostrils 2 (two) times daily.     Ear, Nose, and Throat: Nasal Preparations - Corticosteroids Failed - 04/20/2023 11:59 AM      Failed - Valid encounter within last 12 months    Recent Outpatient Visits   None     Future Appointments             In 2 weeks [name], DO [place] [facility]   In 2 weeks [name], MD [facility]

## 2023-04-28 DIAGNOSIS — L280 Lichen simplex chronicus: Secondary | ICD-10-CM | POA: Diagnosis not present

## 2023-05-04 ENCOUNTER — Ambulatory Visit: Payer: Self-pay | Admitting: Family Medicine

## 2023-05-04 ENCOUNTER — Encounter: Payer: Self-pay | Admitting: Family Medicine

## 2023-05-04 VITALS — BP 106/69 | HR 76 | Temp 97.5°F | Ht 68.0 in | Wt 242.0 lb

## 2023-05-04 DIAGNOSIS — I10 Essential (primary) hypertension: Secondary | ICD-10-CM

## 2023-05-04 DIAGNOSIS — E1122 Type 2 diabetes mellitus with diabetic chronic kidney disease: Secondary | ICD-10-CM | POA: Diagnosis not present

## 2023-05-04 DIAGNOSIS — R454 Irritability and anger: Secondary | ICD-10-CM | POA: Diagnosis not present

## 2023-05-04 DIAGNOSIS — N183 Chronic kidney disease, stage 3 unspecified: Secondary | ICD-10-CM | POA: Diagnosis not present

## 2023-05-04 DIAGNOSIS — R21 Rash and other nonspecific skin eruption: Secondary | ICD-10-CM | POA: Diagnosis not present

## 2023-05-04 DIAGNOSIS — B3749 Other urogenital candidiasis: Secondary | ICD-10-CM

## 2023-05-04 DIAGNOSIS — Z1283 Encounter for screening for malignant neoplasm of skin: Secondary | ICD-10-CM | POA: Diagnosis not present

## 2023-05-04 DIAGNOSIS — E782 Mixed hyperlipidemia: Secondary | ICD-10-CM | POA: Diagnosis not present

## 2023-05-04 LAB — BAYER DCA HB A1C WAIVED: HB A1C (BAYER DCA - WAIVED): 7.7 % — ABNORMAL HIGH (ref 4.8–5.6)

## 2023-05-04 MED ORDER — BENAZEPRIL HCL 40 MG PO TABS: 40.0000 mg | ORAL_TABLET | Freq: Every day | ORAL | 1 refills | Status: DC

## 2023-05-04 MED ORDER — METFORMIN HCL 500 MG PO TABS: ORAL_TABLET | ORAL | 1 refills | Status: DC

## 2023-05-04 MED ORDER — FLUOXETINE HCL 10 MG PO CAPS
10.0000 mg | ORAL_CAPSULE | Freq: Every day | ORAL | 3 refills | Status: DC
Start: 2023-05-04 — End: 2023-09-20

## 2023-05-04 MED ORDER — AMLODIPINE BESYLATE 2.5 MG PO TABS: 2.5000 mg | ORAL_TABLET | Freq: Every day | ORAL | 1 refills | Status: DC

## 2023-05-04 MED ORDER — SEMAGLUTIDE (1 MG/DOSE) 4 MG/3ML ~~LOC~~ SOPN: 2.0000 mg | PEN_INJECTOR | SUBCUTANEOUS | Status: DC

## 2023-05-04 MED ORDER — FLUTICASONE PROPIONATE 50 MCG/ACT NA SUSP: 1.0000 | Freq: Two times a day (BID) | NASAL | 4 refills | Status: AC

## 2023-05-04 MED ORDER — TAMSULOSIN HCL 0.4 MG PO CAPS: ORAL_CAPSULE | ORAL | 1 refills | Status: DC

## 2023-05-04 MED ORDER — SEMAGLUTIDE (2 MG/DOSE) 8 MG/3ML ~~LOC~~ SOPN: 2.0000 mg | PEN_INJECTOR | SUBCUTANEOUS | 0 refills | Status: DC

## 2023-05-04 NOTE — Assessment & Plan Note (Signed)
 Under good control on current regimen. Continue current regimen. Continue to monitor. Call with any concerns. Refills given. Labs drawn today.

## 2023-05-04 NOTE — Assessment & Plan Note (Signed)
 Will start low dose fluoxetine. Recheck 6 weeks. Call with any concerns.

## 2023-05-04 NOTE — Assessment & Plan Note (Signed)
 Stable with A1c of 7.7, but having candidal infections. Will stop his jardiance and increase his ozempic to 2mg . Recheck tolerance in 6 weeks. Call with any concerns.

## 2023-05-04 NOTE — Progress Notes (Signed)
 BP 106/69 (BP Location: Right Arm, Patient Position: Sitting, Cuff Size: Large)   Pulse 76   Temp (!) 97.5 F (36.4 C) (Oral)   Ht 5\' 8"  (1.727 m)   Wt 242 lb (109.8 kg)   SpO2 97%   BMI 36.80 kg/m    Subjective:    Patient ID: Joshua Lee, male    DOB: [birth year], 58 y.o.   MRN: [id number]  HPI: Joshua Lee is a 58 y.o. male  Chief Complaint  Patient presents with   Diabetes   Referral    Pt would like a referral to dermatology. [facility] in [place].    Medication Refill   DIABETES Hypoglycemic episodes:no Polydipsia/polyuria: yes Visual disturbance: no Chest pain: no Paresthesias: yes Glucose Monitoring: yes  Accucheck frequency:  as needed- has gotten a sugar up to 250, usually fasting is 120-160 Taking Insulin?: no Blood Pressure Monitoring: not checking Retinal Examination: Up to Date Foot Exam: Up to Date Diabetic Education: Completed Pneumovax: Up to Date Influenza: Up to Date Aspirin: yes  HYPERTENSION / HYPERLIPIDEMIA Satisfied with current treatment? yes Duration of hypertension: chronic BP monitoring frequency: rarely BP medication side effects: no Past BP meds: metoprolol, amlodipine, benazepril Duration of hyperlipidemia: chronic Cholesterol medication side effects: no Cholesterol supplements: none Past cholesterol medications: atorvastatin, vescepa Medication compliance: excellent compliance Aspirin: yes Recent stressors: yes Recurrent headaches: no Visual changes: no Palpitations: no Dyspnea: no Chest pain: no Lower extremity edema: no Dizzy/lightheaded: no  IRRITABILITY Duration: chronic Status:uncontrolled Anxious mood: yes  Excessive worrying: yes Irritability: yes  Sweating: no Nausea: no Palpitations:no Hyperventilation: no Panic attacks: no Agoraphobia: no  Obscessions/compulsions: no Depressed mood: no    09/24/2021    4:35 PM 08/28/2020   10:19 AM 08/27/2020    3:54 PM 06/10/2020    1:12 PM  05/15/2019   10:18 AM  Depression screen PHQ 2/9  Decreased Interest 0 0 0 0 0  Down, Depressed, Hopeless 0 0 0 0 0  PHQ - 2 Score 0 0 0 0 0   Anhedonia: no Weight changes: no Insomnia: no   Hypersomnia: no Fatigue/loss of energy: yes Feelings of worthlessness: no Feelings of guilt: no Impaired concentration/indecisiveness: no Suicidal ideations: no  Crying spells: no Recent Stressors/Life Changes: yes   Relationship problems: no   Family stress: no     Financial stress: no    Job stress: no    Recent death/loss: no   Relevant past medical, surgical, family and social history reviewed and updated as indicated. Interim medical history since our last visit reviewed. Allergies and medications reviewed and updated.  Review of Systems  Constitutional: Negative.   Respiratory: Negative.    Cardiovascular: Negative.   Musculoskeletal: Negative.   Skin:  Positive for rash. Negative for color change, pallor and wound.  Hematological: Negative.   Psychiatric/Behavioral:  Negative for agitation, behavioral problems, confusion, decreased concentration, dysphoric mood, hallucinations, self-injury, sleep disturbance and suicidal ideas. The patient is nervous/anxious. The patient is not hyperactive.     Per HPI unless specifically indicated above     Objective:    BP 106/69 (BP Location: Right Arm, Patient Position: Sitting, Cuff Size: Large)   Pulse 76   Temp (!) 97.5 F (36.4 C) (Oral)   Ht 5\' 8"  (1.727 m)   Wt 242 lb (109.8 kg)   SpO2 97%   BMI 36.80 kg/m   Wt Readings from Last 3 Encounters:  05/04/23 242 lb (109.8 kg)  03/10/23 240 lb (108.9  kg)  02/04/23 253 lb (114.8 kg)    Physical Exam Vitals and nursing note reviewed.  Constitutional:      General: He is not in acute distress.    Appearance: Normal appearance. He is obese. He is not ill-appearing, toxic-appearing or diaphoretic.  HENT:     Head: Normocephalic and atraumatic.     Right Ear: External ear  normal.     Left Ear: External ear normal.     Nose: Nose normal.     Mouth/Throat:     Mouth: Mucous membranes are moist.     Pharynx: Oropharynx is clear.  Eyes:     General: No scleral icterus.       Right eye: No discharge.        Left eye: No discharge.     Extraocular Movements: Extraocular movements intact.     Conjunctiva/sclera: Conjunctivae normal.     Pupils: Pupils are equal, round, and reactive to light.  Cardiovascular:     Rate and Rhythm: Normal rate and regular rhythm.     Pulses: Normal pulses.     Heart sounds: Normal heart sounds. No murmur heard.    No friction rub. No gallop.  Pulmonary:     Effort: Pulmonary effort is normal. No respiratory distress.     Breath sounds: Normal breath sounds. No stridor. No wheezing, rhonchi or rales.  Chest:     Chest wall: No tenderness.  Musculoskeletal:        General: Normal range of motion.     Cervical back: Normal range of motion and neck supple.  Skin:    General: Skin is warm and dry.     Capillary Refill: Capillary refill takes less than 2 seconds.     Coloration: Skin is not jaundiced or pale.     Findings: No bruising, erythema, lesion or rash.  Neurological:     General: No focal deficit present.     Mental Status: He is alert and oriented to person, place, and time. Mental status is at baseline.  Psychiatric:        Mood and Affect: Mood normal.        Behavior: Behavior normal.        Thought Content: Thought content normal.        Judgment: Judgment normal.     Results for orders placed or performed in visit on 05/04/23  Bayer DCA Hb A1c Waived   Collection Time: 05/04/23 10:51 AM  Result Value Ref Range   HB A1C (BAYER DCA - WAIVED) 7.7 (H) 4.8 - 5.6 %      Assessment & Plan:   Problem List Items Addressed This Visit       Cardiovascular and Mediastinum   Hypertension   Under good control on current regimen. Continue current regimen. Continue to monitor. Call with any concerns. Refills  given. Labs drawn today.        Relevant Medications   amLODipine (NORVASC) 2.5 MG tablet   benazepril (LOTENSIN) 40 MG tablet     Endocrine   Type 2 diabetes mellitus with stage 3 chronic kidney disease, without long-term current use of insulin (HCC) - Primary   Stable with A1c of 7.7, but having candidal infections. Will stop his jardiance and increase his ozempic to 2mg . Recheck tolerance in 6 weeks. Call with any concerns.       Relevant Medications   benazepril (LOTENSIN) 40 MG tablet   metFORMIN (GLUCOPHAGE) 500 MG tablet   Semaglutide, 1  MG/DOSE, 4 MG/3ML SOPN   Semaglutide, 2 MG/DOSE, 8 MG/3ML SOPN   Other Relevant Orders   Bayer DCA Hb A1c Waived (Completed)   CBC with Differential/Platelet   Lipid Panel w/o Chol/HDL Ratio   Comprehensive metabolic panel with GFR     Other   Hyperlipidemia   Under good control on current regimen. Continue current regimen. Continue to monitor. Call with any concerns. Refills given. Labs drawn today.       Relevant Medications   amLODipine (NORVASC) 2.5 MG tablet   benazepril (LOTENSIN) 40 MG tablet   Irritability   Will start low dose fluoxetine. Recheck 6 weeks. Call with any concerns.       Other Visit Diagnoses       Screening for skin cancer       Has been following with dermatology. Needs new referral for derm- new referral sent today.   Relevant Orders   Ambulatory referral to Dermatology     Rash       Has been following with dermatology. Needs new referral for derm- new referral sent today.   Relevant Orders   Ambulatory referral to Dermatology     Candida infection of genital region       Continue his diflucan and cream. Stop jardiance. Recheck 6 weeks.   Relevant Medications   econazole nitrate 1 % cream   clotrimazole-betamethasone (LOTRISONE) cream   fluconazole (DIFLUCAN) 200 MG tablet        Follow up plan: Return in about 6 weeks (around 06/15/2023).

## 2023-05-05 ENCOUNTER — Encounter: Payer: Self-pay | Admitting: Cardiovascular Disease

## 2023-05-05 ENCOUNTER — Ambulatory Visit: Payer: Medicaid Other | Admitting: Cardiovascular Disease

## 2023-05-05 ENCOUNTER — Other Ambulatory Visit: Payer: Self-pay

## 2023-05-05 VITALS — BP 132/64 | HR 84 | Ht 68.0 in | Wt 238.0 lb

## 2023-05-05 DIAGNOSIS — I340 Nonrheumatic mitral (valve) insufficiency: Secondary | ICD-10-CM | POA: Diagnosis not present

## 2023-05-05 DIAGNOSIS — E782 Mixed hyperlipidemia: Secondary | ICD-10-CM | POA: Diagnosis not present

## 2023-05-05 DIAGNOSIS — I10 Essential (primary) hypertension: Secondary | ICD-10-CM

## 2023-05-05 DIAGNOSIS — I2585 Chronic coronary microvascular dysfunction: Secondary | ICD-10-CM | POA: Diagnosis not present

## 2023-05-05 DIAGNOSIS — N182 Chronic kidney disease, stage 2 (mild): Secondary | ICD-10-CM

## 2023-05-05 DIAGNOSIS — E1122 Type 2 diabetes mellitus with diabetic chronic kidney disease: Secondary | ICD-10-CM

## 2023-05-05 DIAGNOSIS — E1169 Type 2 diabetes mellitus with other specified complication: Secondary | ICD-10-CM

## 2023-05-05 LAB — CBC WITH DIFFERENTIAL/PLATELET
Basophils Absolute: 0 10*3/uL (ref 0.0–0.2)
Basos: 0 %
EOS (ABSOLUTE): 0.1 10*3/uL (ref 0.0–0.4)
Eos: 1 %
Hematocrit: 44.6 % (ref 37.5–51.0)
Hemoglobin: 14.5 g/dL (ref 13.0–17.7)
Immature Grans (Abs): 0.1 10*3/uL (ref 0.0–0.1)
Immature Granulocytes: 1 %
Lymphocytes Absolute: 1.7 10*3/uL (ref 0.7–3.1)
Lymphs: 16 %
MCH: 27.3 pg (ref 26.6–33.0)
MCHC: 32.5 g/dL (ref 31.5–35.7)
MCV: 84 fL (ref 79–97)
Monocytes Absolute: 0.7 10*3/uL (ref 0.1–0.9)
Monocytes: 7 %
Neutrophils Absolute: 7.9 10*3/uL — ABNORMAL HIGH (ref 1.4–7.0)
Neutrophils: 75 %
Platelets: 279 10*3/uL (ref 150–450)
RBC: 5.32 x10E6/uL (ref 4.14–5.80)
RDW: 14.5 % (ref 11.6–15.4)
WBC: 10.5 10*3/uL (ref 3.4–10.8)

## 2023-05-05 LAB — COMPREHENSIVE METABOLIC PANEL WITH GFR
ALT: 26 IU/L (ref 0–44)
AST: 28 IU/L (ref 0–40)
Albumin: 4.2 g/dL (ref 3.8–4.9)
Alkaline Phosphatase: 56 IU/L (ref 44–121)
BUN/Creatinine Ratio: 16 (ref 9–20)
BUN: 13 mg/dL (ref 6–24)
Bilirubin Total: 0.5 mg/dL (ref 0.0–1.2)
CO2: 25 mmol/L (ref 20–29)
Calcium: 9.2 mg/dL (ref 8.7–10.2)
Chloride: 96 mmol/L (ref 96–106)
Creatinine, Ser: 0.81 mg/dL (ref 0.76–1.27)
Globulin, Total: 2.5 g/dL (ref 1.5–4.5)
Glucose: 193 mg/dL — ABNORMAL HIGH (ref 70–99)
Potassium: 4.8 mmol/L (ref 3.5–5.2)
Sodium: 135 mmol/L (ref 134–144)
Total Protein: 6.7 g/dL (ref 6.0–8.5)
eGFR: 103 mL/min/{1.73_m2} (ref >59)

## 2023-05-05 LAB — LIPID PANEL W/O CHOL/HDL RATIO
Cholesterol, Total: 94 mg/dL — ABNORMAL LOW (ref 100–199)
HDL: 27 mg/dL — ABNORMAL LOW (ref >39)
LDL Chol Calc (NIH): 46 mg/dL (ref 0–99)
Triglycerides: 113 mg/dL (ref 0–149)
VLDL Cholesterol Cal: 21 mg/dL (ref 5–40)

## 2023-05-05 MED ORDER — ICOSAPENT ETHYL 1 G PO CAPS: 2.0000 g | ORAL_CAPSULE | Freq: Two times a day (BID) | ORAL | 1 refills | Status: DC

## 2023-05-05 MED ORDER — ATORVASTATIN CALCIUM 80 MG PO TABS: 80.0000 mg | ORAL_TABLET | Freq: Every day | ORAL | 1 refills | Status: DC

## 2023-05-05 MED ORDER — METOPROLOL TARTRATE 100 MG PO TABS
100.0000 mg | ORAL_TABLET | Freq: Two times a day (BID) | ORAL | 1 refills | Status: DC
Start: 2023-05-05 — End: 2023-11-14

## 2023-05-05 NOTE — Progress Notes (Signed)
 Cardiology Office Note   Date:  05/05/2023   ID:  Joshua Lee, DOB [birth year], MRN [id number]  PCP:  [name], DO  Cardiologist:  Adrian Blackwater, MD      History of Present Illness: Joshua Lee is a 58 y.o. male who presents for  Chief Complaint  Patient presents with   Follow-up    3 Months follow up    Feels better, no SOB, or chest pains      Past Medical History:  Diagnosis Date   Diabetes mellitus without complication (HCC)    GERD (gastroesophageal reflux disease)    Hyperlipidemia    Hypertension    Kidney stones    Plantar fascial fibromatosis      Past Surgical History:  Procedure Laterality Date   CHOLECYSTECTOMY  2006   GALLBLADDER SURGERY     KIDNEY STONE SURGERY     KNEE ARTHROSCOPY     TOTAL HIP ARTHROPLASTY Right 07/08/2015   Procedure: TOTAL HIP ARTHROPLASTY ANTERIOR APPROACH;  Surgeon: Kennedy Bucker, MD;  Location: [facility] ORS;  Service: Orthopedics;  Laterality: Right;     Current Outpatient Medications  Medication Sig Dispense Refill   Accu-Chek Softclix Lancets lancets Use to check blood glucose two times daily 100 each 12   acetaminophen (TYLENOL) 650 MG CR tablet Take 650-1,950 mg by mouth every 8 (eight) hours as needed for pain.     amLODipine (NORVASC) 2.5 MG tablet Take 1 tablet (2.5 mg total) by mouth daily. 90 tablet 1   aspirin 81 MG tablet Take 81 mg by mouth daily.     atorvastatin (LIPITOR) 80 MG tablet Take 1 tablet (80 mg total) by mouth daily. 90 tablet 1   benazepril (LOTENSIN) 40 MG tablet Take 1 tablet (40 mg total) by mouth daily. 90 tablet 1   Blood Glucose Monitoring Suppl (ACCU-CHEK GUIDE ME) w/Device KIT Use to check blood glucose two times daily 1 kit 0   clotrimazole-betamethasone (LOTRISONE) cream Apply 1 Application topically 2 (two) times daily.     Coenzyme Q-10 100 MG capsule Take 100 mg by mouth daily.     cyclobenzaprine (FLEXERIL) 10 MG tablet Take 1 tablet (10 mg total) by mouth at bedtime.  30 tablet 1   econazole nitrate 1 % cream Apply 1 Application topically daily.     FLUoxetine (PROZAC) 10 MG capsule Take 1 capsule (10 mg total) by mouth daily. 30 capsule 3   fluticasone (FLONASE) 50 MCG/ACT nasal spray Place 1 spray into both nostrils 2 (two) times daily. 48 mL 4   gabapentin (NEURONTIN) 300 MG capsule Take 300 mg by mouth 3 (three) times daily as needed.     Garlic 1000 MG CAPS Take 1,000 mg by mouth daily.     glucose blood (ACCU-CHEK GUIDE TEST) test strip 1 each by Other route 2 (two) times daily. 200 each 3   icosapent Ethyl (VASCEPA) 1 g capsule Take 2 capsules (2 g total) by mouth 2 (two) times daily. 360 capsule 1   metFORMIN (GLUCOPHAGE) 500 MG tablet TAKE 2 TABLETS(1000 MG) BY MOUTH TWICE DAILY WITH A MEAL 360 tablet 1   metoprolol tartrate (LOPRESSOR) 100 MG tablet Take 1 tablet (100 mg total) by mouth 2 (two) times daily. 180 tablet 1   Potassium Citrate 15 MEQ (1620 MG) TBCR Take 15 mEq by mouth 2 (two) times daily. 2 tab QAM, 1 tab QPM  1   saline (AYR) GEL Place 1 Application into both nostrils  every 6 (six) hours as needed. 14.1 g 6   Semaglutide, 1 MG/DOSE, 4 MG/3ML SOPN Inject 2 mg as directed once a week.     Semaglutide, 2 MG/DOSE, 8 MG/3ML SOPN Inject 2 mg as directed once a week. 9 mL 0   tamsulosin (FLOMAX) 0.4 MG CAPS capsule TAKE 1 CAPSULE BY MOUTH EVERY DAY 90 capsule 1   traMADol (ULTRAM) 50 MG tablet Take 50 mg by mouth every 12 (twelve) hours as needed.     No current facility-administered medications for this visit.    Allergies:   Penicillins    Social History:   reports that he has never smoked. His smokeless tobacco use includes snuff. He reports that he does not currently use alcohol. He reports that he does not use drugs.   Family History:  family history includes Diabetes in his maternal uncle.    ROS:     Review of Systems  Constitutional: Negative.   HENT: Negative.    Eyes: Negative.   Respiratory: Negative.     Gastrointestinal: Negative.   Genitourinary: Negative.   Musculoskeletal: Negative.   Skin: Negative.   Neurological: Negative.   Endo/Heme/Allergies: Negative.   Psychiatric/Behavioral: Negative.    All other systems reviewed and are negative.     All other systems are reviewed and negative.    PHYSICAL EXAM: VS:  BP 132/64   Pulse 84   Ht 5\' 8"  (1.727 m)   Wt 238 lb (108 kg)   SpO2 95%   BMI 36.19 kg/m  , BMI Body mass index is 36.19 kg/m. Last weight:  Wt Readings from Last 3 Encounters:  05/05/23 238 lb (108 kg)  05/04/23 242 lb (109.8 kg)  03/10/23 240 lb (108.9 kg)     Physical Exam Vitals reviewed.  Constitutional:      Appearance: Normal appearance. He is normal weight.  HENT:     Head: Normocephalic.     Nose: Nose normal.     Mouth/Throat:     Mouth: Mucous membranes are moist.  Eyes:     Pupils: Pupils are equal, round, and reactive to light.  Cardiovascular:     Rate and Rhythm: Normal rate and regular rhythm.     Pulses: Normal pulses.     Heart sounds: Normal heart sounds.  Pulmonary:     Effort: Pulmonary effort is normal.  Abdominal:     General: Abdomen is flat. Bowel sounds are normal.  Musculoskeletal:        General: Normal range of motion.     Cervical back: Normal range of motion.  Skin:    General: Skin is warm.  Neurological:     General: No focal deficit present.     Mental Status: He is alert.  Psychiatric:        Mood and Affect: Mood normal.       EKG:   Recent Labs: 10/21/2022: TSH 2.390 05/04/2023: ALT 26; BUN 13; Creatinine, Ser 0.81; Hemoglobin 14.5; Platelets 279; Potassium 4.8; Sodium 135    Lipid Panel    Component Value Date/Time   CHOL 94 (L) 05/04/2023 1053   CHOL 108 08/29/2018 1045   TRIG 113 05/04/2023 1053   TRIG 221 (H) 08/29/2018 1045   HDL 27 (L) 05/04/2023 1053   CHOLHDL 6.5 (H) 02/09/2017 1002   VLDL 44 (H) 08/29/2018 1045   LDLCALC 46 05/04/2023 1053      Other studies  Reviewed: Additional studies/ records that were reviewed today include:  Review  of the above records demonstrates:       No data to display            ASSESSMENT AND PLAN:    ICD-10-CM   1. Mixed hyperlipidemia  E78.2     2. Primary hypertension  I10    stable    3. Nonrheumatic mitral valve regurgitation  I34.0     4. Chronic coronary microvascular dysfunction  I25.85     5. CKD (chronic kidney disease), stage II  N18.2        Problem List Items Addressed This Visit       Cardiovascular and Mediastinum   Hypertension     Genitourinary   CKD (chronic kidney disease), stage II     Other   Hyperlipidemia - Primary   Other Visit Diagnoses       Nonrheumatic mitral valve regurgitation         Chronic coronary microvascular dysfunction              Disposition:   Return in about 3 months (around 08/04/2023).    Total time spent: 30 minutes  Signed,  Debborah Fairly, MD  05/05/2023 10:42 AM    [facility]

## 2023-05-09 ENCOUNTER — Ambulatory Visit: Payer: Medicaid Other | Admitting: *Deleted

## 2023-05-09 NOTE — Patient Outreach (Signed)
 Complex Care Management   Visit Note  05/09/2023  Name:  Joshua Lee MRN: [id number] DOB: Jul 02, 1965  Situation: Referral received for Complex Care Management related to Diabetes with Complications I obtained verbal consent from Patient.  Visit completed with Patient  on the phone  Patient declines to complete full assessment, stating questions are too personal.  Rationale for thorough assessment explained, he again declines, request no further calls.   Background:   Past Medical History:  Diagnosis Date   Diabetes mellitus without complication (HCC)    GERD (gastroesophageal reflux disease)    Hyperlipidemia    Hypertension    Kidney stones    Plantar fascial fibromatosis     Assessment: Patient Reported Symptoms:  Cognitive Cognitive Status: Alert and oriented to person, place, and time, Normal speech and language skills      Neurological Neurological Review of Symptoms: Not assessed    HEENT HEENT Symptoms Reported: Not assessed      Cardiovascular Cardiovascular Symptoms Reported: Not assessed    Respiratory Respiratory Symptoms Reported: Not assesed    Endocrine Patient reports the following symptoms related to hypoglycemia or hyperglycemia : Not assessed    Gastrointestinal Gastrointestinal Symptoms Reported: Not assessed      Genitourinary Genitourinary Symptoms Reported: Not assessed    Integumentary Integumentary Symptoms Reported: Not assessed    Musculoskeletal Musculoskelatal Symptoms Reviewed: Not assessed        Psychosocial Psychosocial Symptoms Reported: Not assessed            09/24/2021    4:35 PM  Depression screen PHQ 2/9  Decreased Interest 0  Down, Depressed, Hopeless 0  PHQ - 2 Score 0    There were no vitals filed for this visit.  Medications Reviewed Today   Medications were not reviewed in this encounter     Recommendation:   PCP Follow-up scheduled for next month  Follow Up Plan:   Closing From:  Complex Care  Management Patient declines further Care management calls.   Holland Lundborg, RN, MSN, CCM [facility], [facility] RN Care Coordinator Direct Dial: [phone] / Main [phone] Fax [phone] Email: [email or web address]

## 2023-05-10 ENCOUNTER — Encounter: Payer: Self-pay | Admitting: Family Medicine

## 2023-05-11 ENCOUNTER — Other Ambulatory Visit: Payer: Self-pay | Admitting: Family Medicine

## 2023-05-11 NOTE — Progress Notes (Signed)
Results printed and mailed.   

## 2023-05-11 NOTE — Progress Notes (Signed)
 Letter printed and mailed.

## 2023-05-11 NOTE — Telephone Encounter (Signed)
 Discontinued on 05/04/23 by PCP, will refuse this request.  Requested Prescriptions  Pending Prescriptions Disp Refills   JARDIANCE  25 MG TABS tablet [Pharmacy Med Name: JARDIANCE  25 MG TABLET] 30 tablet 2    Sig: TAKE 1 TABLET BY MOUTH EVERY DAY BEFORE BREAKFAST     There is no refill protocol information for this order

## 2023-05-12 DIAGNOSIS — M25561 Pain in right knee: Secondary | ICD-10-CM | POA: Diagnosis not present

## 2023-05-12 DIAGNOSIS — E119 Type 2 diabetes mellitus without complications: Secondary | ICD-10-CM | POA: Diagnosis not present

## 2023-05-27 ENCOUNTER — Encounter: Payer: Self-pay | Admitting: Podiatry

## 2023-05-27 ENCOUNTER — Ambulatory Visit: Admitting: Podiatry

## 2023-05-27 DIAGNOSIS — B351 Tinea unguium: Secondary | ICD-10-CM

## 2023-05-27 DIAGNOSIS — M79674 Pain in right toe(s): Secondary | ICD-10-CM

## 2023-05-27 DIAGNOSIS — L600 Ingrowing nail: Secondary | ICD-10-CM | POA: Diagnosis not present

## 2023-05-27 DIAGNOSIS — M722 Plantar fascial fibromatosis: Secondary | ICD-10-CM

## 2023-05-27 DIAGNOSIS — M79675 Pain in left toe(s): Secondary | ICD-10-CM | POA: Diagnosis not present

## 2023-05-27 MED ORDER — BETAMETHASONE SOD PHOS & ACET 6 (3-3) MG/ML IJ SUSP
3.0000 mg | Freq: Once | INTRAMUSCULAR | Status: AC
Start: 2023-05-27 — End: 2023-05-27
  Administered 2023-05-27: 3 mg via INTRA_ARTICULAR

## 2023-05-27 NOTE — Progress Notes (Signed)
 Chief Complaint  Patient presents with   Diabetes    "I have some places on my big toes and I have some spots on my other toes.  I just want to know what's going on.  I want to get my toenails cut."  Dr. Terre Ferri - 05/04/2023; A1c - 7.7 N - places on my big toes L - ulcer distal hallux bilateral D - 2 weeks O - suddenly, left one has gotten better C - red, blister A - shoes T - Campho Phenique, Neosporin     SUBJECTIVE Patient with a history of diabetes mellitus presents to office today complaining of elongated, thickened nails that cause pain while ambulating in shoes.  Patient is unable to trim their own nails.  Patient also experiencing heel pain that has been ongoing and chronic.  Patient is here for further evaluation and treatment.  Past Medical History:  Diagnosis Date   Diabetes mellitus without complication (HCC)    GERD (gastroesophageal reflux disease)    Hyperlipidemia    Hypertension    Kidney stones    Plantar fascial fibromatosis     Allergies  Allergen Reactions   Penicillins Rash    Has patient had a PCN reaction causing immediate rash, facial/tongue/throat swelling, SOB or lightheadedness with hypotension: unknown Has patient had a PCN reaction causing severe rash involving mucus membranes or skin necrosis: unknown Has patient had a PCN reaction that required hospitalization: unknown Has patient had a PCN reaction occurring within the last 10 years: no If all of the above answers are "NO", then may proceed with Cephalosporin use.      OBJECTIVE General Patient is awake, alert, and oriented x 3 and in no acute distress. Derm Skin is dry and supple bilateral. Negative open lesions or macerations. Remaining integument unremarkable. Nails are tender, long, thickened and dystrophic with subungual debris, consistent with onychomycosis, 1-5 bilateral. No signs of infection noted.  Ingrowing portion of nail noted to the medial aspect of the left great toe  with associated tenderness Vasc  DP and PT pedal pulses palpable bilaterally. Temperature gradient within normal limits.  Neuro light touch and protective threshold sensation diminished bilaterally.  Musculoskeletal Exam tenderness with palpation of the plantar medial aspect of the bilateral heels  ASSESSMENT 1. Diabetes Mellitus w/ peripheral neuropathy 2.  Pain due to onychomycosis of toenails bilateral 3.  Plantar fasciitis bilateral 4.  Ingrown toenail left  PLAN OF CARE 1. Patient evaluated today. 2. Instructed to maintain good pedal hygiene and foot care. Stressed importance of controlling blood sugar.  3. Mechanical debridement of nails 1-5 bilaterally performed using a nail nipper. Filed with dremel without incident.  4.  Injection of 0.5 cc Celestone  Soluspan injected into the plantar fascia bilateral  5.  Recommend good supportive tennis shoes and sneakers.  Advised against going barefoot  6.  The ingrowing portion of nail was also discussed today.  Discussed partial nail matricectomy versus simple debridement.  The patient opted for simple debridement.  The offending border of the nail plate was debrided away today using a nail nipper without incident.  Triple anabiotic and a Band-Aid was applied.   7.  Return to clinic in 3 mos. routine footcare    Dot Gazella, DPM [facility]  Dr. Dot Gazella, DPM    [address]  [place], [place] [address]                Office [phone]  Fax [phone]

## 2023-06-01 ENCOUNTER — Other Ambulatory Visit: Payer: Self-pay

## 2023-06-01 NOTE — Progress Notes (Signed)
   06/01/2023  Patient ID: Joshua Lee, male   DOB: [birth year], 58 y.o.   MRN: [id number]  Subjective/objective Telephone visit to follow-up on management of type 2 diabetes   Diabetes management plan -Current medications: metformin  1000 mg 2 times daily, Ozempic  2 mg weekly -Jardiance  25mg  stopped approximately 1 month ago based on yeast infection, and Ozempic  increased to 2mg  (patient using 1mg  pens on hand doing two injections weekly) -Patient endorses tolerated increased Ozempic  dose well -A1c steady at 7.7% -Checking home BG regularly, and states values had been 120-150; but he recently had to get steroid injections, and now values are as high at 200-300   Assessment/plan   Diabetes management plan -Continue current regimen at this time- expect to see BG readings normalize in the next few weeks -Continue to monitor FBG and record values -Sees PCP again 5/27 -Could consider switching patient back to Farxiga , which he was on in the past if additional BG control is needed.  Could likely get PA approved if needed based on trial/failure of Jardiance    Follow-up: 4 weeks   Linn Rich, PharmD, DPLA

## 2023-06-02 ENCOUNTER — Telehealth: Payer: Self-pay

## 2023-06-02 NOTE — Progress Notes (Signed)
   06/02/2023  Patient ID: Maxene Span, male   DOB: 1965/01/21, 58 y.o.   MRN: [id number]  Returning missed call/voicemail from patient inquiring about where to find open-toed compression stockings.  Had recent sore thought to be caused from copper-toed compression stockings he had been wearing.  Patient advised on local medical supply stores that should stock or be able to order these.  Linn Rich, PharmD, DPLA

## 2023-06-08 DIAGNOSIS — M542 Cervicalgia: Secondary | ICD-10-CM | POA: Diagnosis not present

## 2023-06-08 DIAGNOSIS — M9905 Segmental and somatic dysfunction of pelvic region: Secondary | ICD-10-CM | POA: Diagnosis not present

## 2023-06-08 DIAGNOSIS — M4306 Spondylolysis, lumbar region: Secondary | ICD-10-CM | POA: Diagnosis not present

## 2023-06-08 DIAGNOSIS — M9904 Segmental and somatic dysfunction of sacral region: Secondary | ICD-10-CM | POA: Diagnosis not present

## 2023-06-08 DIAGNOSIS — M9902 Segmental and somatic dysfunction of thoracic region: Secondary | ICD-10-CM | POA: Diagnosis not present

## 2023-06-08 DIAGNOSIS — M5417 Radiculopathy, lumbosacral region: Secondary | ICD-10-CM | POA: Diagnosis not present

## 2023-06-08 DIAGNOSIS — M5418 Radiculopathy, sacral and sacrococcygeal region: Secondary | ICD-10-CM | POA: Diagnosis not present

## 2023-06-08 DIAGNOSIS — M62830 Muscle spasm of back: Secondary | ICD-10-CM | POA: Diagnosis not present

## 2023-06-08 DIAGNOSIS — M5416 Radiculopathy, lumbar region: Secondary | ICD-10-CM | POA: Diagnosis not present

## 2023-06-08 DIAGNOSIS — M9903 Segmental and somatic dysfunction of lumbar region: Secondary | ICD-10-CM | POA: Diagnosis not present

## 2023-06-08 DIAGNOSIS — M9901 Segmental and somatic dysfunction of cervical region: Secondary | ICD-10-CM | POA: Diagnosis not present

## 2023-06-14 ENCOUNTER — Encounter: Payer: Self-pay | Admitting: Family Medicine

## 2023-06-14 ENCOUNTER — Ambulatory Visit: Admitting: Family Medicine

## 2023-06-14 VITALS — BP 125/77 | HR 92 | Ht 68.0 in | Wt 245.2 lb

## 2023-06-14 DIAGNOSIS — B3749 Other urogenital candidiasis: Secondary | ICD-10-CM

## 2023-06-14 DIAGNOSIS — R109 Unspecified abdominal pain: Secondary | ICD-10-CM

## 2023-06-14 DIAGNOSIS — N183 Chronic kidney disease, stage 3 unspecified: Secondary | ICD-10-CM | POA: Diagnosis not present

## 2023-06-14 DIAGNOSIS — E1122 Type 2 diabetes mellitus with diabetic chronic kidney disease: Secondary | ICD-10-CM

## 2023-06-14 DIAGNOSIS — Z1283 Encounter for screening for malignant neoplasm of skin: Secondary | ICD-10-CM | POA: Diagnosis not present

## 2023-06-14 MED ORDER — DICLOFENAC SODIUM 1 % EX GEL: 4.0000 g | Freq: Four times a day (QID) | CUTANEOUS | 12 refills | Status: AC

## 2023-06-14 MED ORDER — NYSTATIN 100000 UNIT/GM EX POWD: 1.0000 | Freq: Three times a day (TID) | CUTANEOUS | 3 refills | Status: DC

## 2023-06-14 MED ORDER — SEMAGLUTIDE (2 MG/DOSE) 8 MG/3ML ~~LOC~~ SOPN
2.0000 mg | PEN_INJECTOR | SUBCUTANEOUS | 0 refills | Status: DC
Start: 2023-06-14 — End: 2023-08-02

## 2023-06-14 NOTE — Progress Notes (Signed)
 BP 125/77 (BP Location: Left Arm, Patient Position: Sitting, Cuff Size: Large)   Pulse 92   Ht 5\' 8"  (1.727 m)   Wt 245 lb 3.2 oz (111.2 kg)   SpO2 96%   BMI 37.28 kg/m    Subjective:    Patient ID: Joshua Lee, male    DOB: [birth year], 58 y.o.   MRN: [id number]  HPI: KEYAN FOLSON is a 58 y.o. male  Chief Complaint  Patient presents with   Diabetes   DIABETES Hypoglycemic episodes:no Polydipsia/polyuria: no Visual disturbance: no Chest pain: no Paresthesias: yes Glucose Monitoring: yes  Accucheck frequency: Daily Taking Insulin ?: no Blood Pressure Monitoring: rarely Retinal Examination: Up to Date Foot Exam: Up to Date Diabetic Education: Completed Pneumovax: Not up to Date Influenza: Up to Date Aspirin : no  Rash is better. Still has some hyperpigmentation. No other concerns or complaints at this time.   Relevant past medical, surgical, family and social history reviewed and updated as indicated. Interim medical history since our last visit reviewed. Allergies and medications reviewed and updated.  Review of Systems  Constitutional: Negative.   Respiratory: Negative.    Cardiovascular: Negative.   Musculoskeletal:  Positive for back pain. Negative for arthralgias, gait problem, joint swelling, myalgias, neck pain and neck stiffness.  Skin: Negative.   Neurological: Negative.   Psychiatric/Behavioral: Negative.      Per HPI unless specifically indicated above     Objective:     BP 125/77 (BP Location: Left Arm, Patient Position: Sitting, Cuff Size: Large)   Pulse 92   Ht 5\' 8"  (1.727 m)   Wt 245 lb 3.2 oz (111.2 kg)   SpO2 96%   BMI 37.28 kg/m   Wt Readings from Last 3 Encounters:  06/14/23 245 lb 3.2 oz (111.2 kg)  05/05/23 238 lb (108 kg)  05/04/23 242 lb (109.8 kg)    Physical Exam Vitals and nursing note reviewed.  Constitutional:      General: He is not in acute distress.    Appearance: Normal appearance. He is obese. He is not  ill-appearing, toxic-appearing or diaphoretic.  HENT:     Head: Normocephalic and atraumatic.     Right Ear: External ear normal.     Left Ear: External ear normal.     Nose: Nose normal.     Mouth/Throat:     Mouth: Mucous membranes are moist.     Pharynx: Oropharynx is clear.  Eyes:     General: No scleral icterus.       Right eye: No discharge.        Left eye: No discharge.     Extraocular Movements: Extraocular movements intact.     Conjunctiva/sclera: Conjunctivae normal.     Pupils: Pupils are equal, round, and reactive to light.  Cardiovascular:     Rate and Rhythm: Normal rate and regular rhythm.     Pulses: Normal pulses.     Heart sounds: Normal heart sounds. No murmur heard.    No friction rub. No gallop.  Pulmonary:     Effort: Pulmonary effort is normal. No respiratory distress.     Breath sounds: Normal breath sounds. No stridor. No wheezing, rhonchi or rales.  Chest:     Chest wall: No tenderness.  Musculoskeletal:        General: Normal range of motion.     Cervical back: Normal range of motion and neck supple.  Skin:    General: Skin is warm and dry.  Capillary Refill: Capillary refill takes less than 2 seconds.     Coloration: Skin is not jaundiced or pale.     Findings: No bruising, erythema, lesion or rash.  Neurological:     General: No focal deficit present.     Mental Status: He is alert and oriented to person, place, and time. Mental status is at baseline.  Psychiatric:        Mood and Affect: Mood normal.        Behavior: Behavior normal.        Thought Content: Thought content normal.        Judgment: Judgment normal.     Results for orders placed or performed in visit on 05/04/23  Bayer DCA Hb A1c Waived   Collection Time: 05/04/23 10:51 AM  Result Value Ref Range   HB A1C (BAYER DCA - WAIVED) 7.7 (H) 4.8 - 5.6 %  CBC with Differential/Platelet   Collection Time: 05/04/23 10:53 AM  Result Value Ref Range   WBC 10.5 3.4 - 10.8  x10E3/uL   RBC 5.32 4.14 - 5.80 x10E6/uL   Hemoglobin 14.5 13.0 - 17.7 g/dL   Hematocrit 40.9 81.1 - 51.0 %   MCV 84 79 - 97 fL   MCH 27.3 26.6 - 33.0 pg   MCHC 32.5 31.5 - 35.7 g/dL   RDW 91.4 78.2 - 95.6 %   Platelets 279 150 - 450 x10E3/uL   Neutrophils 75 Not Estab. %   Lymphs 16 Not Estab. %   Monocytes 7 Not Estab. %   Eos 1 Not Estab. %   Basos 0 Not Estab. %   Neutrophils Absolute 7.9 (H) 1.4 - 7.0 x10E3/uL   Lymphocytes Absolute 1.7 0.7 - 3.1 x10E3/uL   Monocytes Absolute 0.7 0.1 - 0.9 x10E3/uL   EOS (ABSOLUTE) 0.1 0.0 - 0.4 x10E3/uL   Basophils Absolute 0.0 0.0 - 0.2 x10E3/uL   Immature Granulocytes 1 Not Estab. %   Immature Grans (Abs) 0.1 0.0 - 0.1 x10E3/uL  Lipid Panel w/o Chol/HDL Ratio   Collection Time: 05/04/23 10:53 AM  Result Value Ref Range   Cholesterol, Total 94 (L) 100 - 199 mg/dL   Triglycerides 213 0 - 149 mg/dL   HDL 27 (L) >08 mg/dL   VLDL Cholesterol Cal 21 5 - 40 mg/dL   LDL Chol Calc (NIH) 46 0 - 99 mg/dL  Comprehensive metabolic panel with GFR   Collection Time: 05/04/23 10:53 AM  Result Value Ref Range   Glucose 193 (H) 70 - 99 mg/dL   BUN 13 6 - 24 mg/dL   Creatinine, Ser 6.57 0.76 - 1.27 mg/dL   eGFR 846 >96 EX/BMW/4.13   BUN/Creatinine Ratio 16 9 - 20   Sodium 135 134 - 144 mmol/L   Potassium 4.8 3.5 - 5.2 mmol/L   Chloride 96 96 - 106 mmol/L   CO2 25 20 - 29 mmol/L   Calcium  9.2 8.7 - 10.2 mg/dL   Total Protein 6.7 6.0 - 8.5 g/dL   Albumin 4.2 3.8 - 4.9 g/dL   Globulin, Total 2.5 1.5 - 4.5 g/dL   Bilirubin Total 0.5 0.0 - 1.2 mg/dL   Alkaline Phosphatase 56 44 - 121 IU/L   AST 28 0 - 40 IU/L   ALT 26 0 - 44 IU/L      Assessment & Plan:   Problem List Items Addressed This Visit       Endocrine   Type 2 diabetes mellitus with stage 3 chronic kidney disease,  without long-term current use of insulin  (HCC) - Primary   Tolerating his higher dose of his ozempic  well. Will continue current regimen and recheck in 6 weeks. Call with  any concerns.       Relevant Medications   Semaglutide , 2 MG/DOSE, 8 MG/3ML SOPN   Other Visit Diagnoses       Flank pain       Will recheck urine. Await results. Treat as needed.   Relevant Orders   Urinalysis, Routine w reflex microscopic     Candida infection of genital region       Improved significantly off the jardiance . Continue PRN nystatin. Call with any concerns.   Relevant Medications   nystatin (MYCOSTATIN/NYSTOP) powder     Screening for skin cancer       Needs new referral to be seen, but has been seeing them for almost a year. New referral sent.   Relevant Orders   Ambulatory referral to Dermatology        Follow up plan: Return in about 7 weeks (around 08/03/2023).   25 minutes spent with patient today

## 2023-06-14 NOTE — Assessment & Plan Note (Signed)
 Tolerating his higher dose of his ozempic  well. Will continue current regimen and recheck in 6 weeks. Call with any concerns.

## 2023-06-28 ENCOUNTER — Ambulatory Visit: Payer: Self-pay

## 2023-06-28 NOTE — Telephone Encounter (Signed)
 FYI Only or Action Required?: Action required by provider  Patient was last seen in primary care on 06/14/2023 by Terre Ferri P, DO. Called Nurse Triage reporting Abdominal Pain, medication, and bit tongue. Symptoms began several days ago. Interventions attempted: Rest, hydration, or home remedies and Ice/heat application. Symptoms are: unchanged.  Triage Disposition: See Physician Within 24 Hours  Patient/caregiver understands and will follow disposition?: No, wishes to speak with PCP  Copied from CRM [id number]. Topic: Clinical - Red Word Triage >> Jun 28, 2023  4:25 PM Geneva B wrote: Red Word that prompted transfer to Nurse Triage: right side stomach pain Reason for Disposition  MODERATE pain (e.g., interferes with normal activities or awakens from sleep)  Probable canker sore(s)  Answer Assessment - Initial Assessment Questions 1. LOCATION: "Where does it hurt?" (e.g., left, right)     Right flank  2. ONSET: "When did the pain start?"     today 3. SEVERITY: "How bad is the pain?" (e.g., Scale 1-10; mild, moderate, or severe)   - MILD (1-3): doesn't interfere with normal activities    - MODERATE (4-7): interferes with normal activities or awakens from sleep    - SEVERE (8-10): excruciating pain and patient unable to do normal activities (stays in bed)       Sharp moderate 4. PATTERN: "Does the pain come and go, or is it constant?"      intermittent 5. CAUSE: "What do you think is causing the pain?"     unsure 6. OTHER SYMPTOMS:  "Do you have any other symptoms?" (e.g., fever, abdomen pain, vomiting, leg weakness, burning with urination, blood in urine)     Denies  Refusing acute visit 06/29/23, will go to UC if symptoms worsen or do not improve today.  Answer Assessment - Initial Assessment Questions 1. LOCATION: "Where is the ulcer located?"      Tip of tongue 2. NUMBER: "How many ulcers are there?"      one 3. SIZE: "How large is the ulcer?"      small 4. SEVERITY: "Are  they painful?" If Yes, ask: "How bad is it?"  (Scale 1-10; or mild, moderate, severe)  - MILD - eating  and drinking normally   - MODERATE - decreased liquid intake   - SEVERE - drinking very little      moderate 5. ONSET: "When did you first notice the ulcer?"      today 6. RECURRENT SYMPTOM: "Have you had a mouth ulcer before?" If Yes, ask: "When was the last time?" and "What happened that time?"      No 7. CAUSE: "What do you think is causing the mouth ulcer?"     Accidental bite 8. OTHER SYMPTOMS: "Do you have any other symptoms?" (e.g., fever)     Flank pain  Answer Assessment - Initial Assessment Questions 1. NAME of MEDICINE: "What medicine(s) are you calling about?"     Jardiance  2. QUESTION: "What is your question?" (e.g., double dose of medicine, side effect)     Should I be restarting this medication? 3. PRESCRIBER: "Who prescribed the medicine?" Reason: if prescribed by specialist, call should be referred to that group.     PCP 4. SYMPTOMS: "Do you have any symptoms?" If Yes, ask: "What symptoms are you having?"  "How bad are the symptoms (e.g., mild, moderate, severe)     NA-see other protocol documentation  Protocols used: Flank Pain-A-AH, Mouth Ulcers-A-AH, Medication Question Call-A-AH

## 2023-06-29 DIAGNOSIS — M9903 Segmental and somatic dysfunction of lumbar region: Secondary | ICD-10-CM | POA: Diagnosis not present

## 2023-06-29 DIAGNOSIS — M9901 Segmental and somatic dysfunction of cervical region: Secondary | ICD-10-CM | POA: Diagnosis not present

## 2023-06-29 DIAGNOSIS — M9904 Segmental and somatic dysfunction of sacral region: Secondary | ICD-10-CM | POA: Diagnosis not present

## 2023-06-29 DIAGNOSIS — M62830 Muscle spasm of back: Secondary | ICD-10-CM | POA: Diagnosis not present

## 2023-06-29 DIAGNOSIS — M4306 Spondylolysis, lumbar region: Secondary | ICD-10-CM | POA: Diagnosis not present

## 2023-06-29 DIAGNOSIS — M5416 Radiculopathy, lumbar region: Secondary | ICD-10-CM | POA: Diagnosis not present

## 2023-06-29 DIAGNOSIS — M5417 Radiculopathy, lumbosacral region: Secondary | ICD-10-CM | POA: Diagnosis not present

## 2023-06-29 DIAGNOSIS — M9905 Segmental and somatic dysfunction of pelvic region: Secondary | ICD-10-CM | POA: Diagnosis not present

## 2023-06-29 DIAGNOSIS — M9902 Segmental and somatic dysfunction of thoracic region: Secondary | ICD-10-CM | POA: Diagnosis not present

## 2023-06-29 DIAGNOSIS — M5418 Radiculopathy, sacral and sacrococcygeal region: Secondary | ICD-10-CM | POA: Diagnosis not present

## 2023-06-29 DIAGNOSIS — M542 Cervicalgia: Secondary | ICD-10-CM | POA: Diagnosis not present

## 2023-07-01 ENCOUNTER — Other Ambulatory Visit: Payer: Self-pay

## 2023-07-01 NOTE — Progress Notes (Signed)
   07/01/2023  Patient ID: Joshua Lee, male   DOB: [birth year], 58 y.o.   MRN: [id number]  Subjective/objective Telephone visit to follow-up on management of type 2 diabetes   Diabetes management plan -Current medications: metformin  1000 mg 2 times daily, Ozempic  2 mg weekly -Jardiance  25mg  stopped a couple of months ago based on yeast infection, and Ozempic  increased to 2mg   -Patient endorses tolerated increased Ozempic  dose well -A1c steady at 7.7% -Checking home BG regularly, and states most readings are ranging 160-170- these readings are 3-4 hours after eating (supper)   Assessment/plan   Diabetes management plan -Continue current regimen at this time -Continue to monitor FBG and record values and bring in to next visit with Dr. Lincoln Renshaw -Sees PCP again 7/15 -Recommended dietary modifications, including decreasing carbohydrate intake and sugary foods (sweet tea and lemon cookies at night) -Could consider retial of Jardiance , or switching patient back to Farxiga , which he was on in the past if additional BG control is needed.  Could likely get PA approved for Farxiga  if needed based on trial/failure of Jardiance    Follow-up: 2 months   Linn Rich, PharmD, DPLA

## 2023-07-06 DIAGNOSIS — M9905 Segmental and somatic dysfunction of pelvic region: Secondary | ICD-10-CM | POA: Diagnosis not present

## 2023-07-06 DIAGNOSIS — M9904 Segmental and somatic dysfunction of sacral region: Secondary | ICD-10-CM | POA: Diagnosis not present

## 2023-07-06 DIAGNOSIS — M542 Cervicalgia: Secondary | ICD-10-CM | POA: Diagnosis not present

## 2023-07-06 DIAGNOSIS — M5418 Radiculopathy, sacral and sacrococcygeal region: Secondary | ICD-10-CM | POA: Diagnosis not present

## 2023-07-06 DIAGNOSIS — M5416 Radiculopathy, lumbar region: Secondary | ICD-10-CM | POA: Diagnosis not present

## 2023-07-06 DIAGNOSIS — M9902 Segmental and somatic dysfunction of thoracic region: Secondary | ICD-10-CM | POA: Diagnosis not present

## 2023-07-06 DIAGNOSIS — M4306 Spondylolysis, lumbar region: Secondary | ICD-10-CM | POA: Diagnosis not present

## 2023-07-06 DIAGNOSIS — M955 Acquired deformity of pelvis: Secondary | ICD-10-CM | POA: Diagnosis not present

## 2023-07-06 DIAGNOSIS — M9903 Segmental and somatic dysfunction of lumbar region: Secondary | ICD-10-CM | POA: Diagnosis not present

## 2023-07-06 DIAGNOSIS — M9901 Segmental and somatic dysfunction of cervical region: Secondary | ICD-10-CM | POA: Diagnosis not present

## 2023-07-06 DIAGNOSIS — M62830 Muscle spasm of back: Secondary | ICD-10-CM | POA: Diagnosis not present

## 2023-07-21 ENCOUNTER — Telehealth: Payer: Self-pay

## 2023-07-21 DIAGNOSIS — M48061 Spinal stenosis, lumbar region without neurogenic claudication: Secondary | ICD-10-CM | POA: Diagnosis not present

## 2023-07-21 DIAGNOSIS — M51369 Other intervertebral disc degeneration, lumbar region without mention of lumbar back pain or lower extremity pain: Secondary | ICD-10-CM | POA: Diagnosis not present

## 2023-07-21 DIAGNOSIS — M7061 Trochanteric bursitis, right hip: Secondary | ICD-10-CM | POA: Diagnosis not present

## 2023-07-21 DIAGNOSIS — M791 Myalgia, unspecified site: Secondary | ICD-10-CM | POA: Diagnosis not present

## 2023-07-21 DIAGNOSIS — Z79899 Other long term (current) drug therapy: Secondary | ICD-10-CM | POA: Diagnosis not present

## 2023-07-21 DIAGNOSIS — M25561 Pain in right knee: Secondary | ICD-10-CM | POA: Diagnosis not present

## 2023-07-21 DIAGNOSIS — M5126 Other intervertebral disc displacement, lumbar region: Secondary | ICD-10-CM | POA: Diagnosis not present

## 2023-07-21 NOTE — Progress Notes (Signed)
   07/21/2023  Patient ID: Joshua Lee, male   DOB: 1965-11-05, 59 y.o.   MRN: [id number]  Incoming call from patient inquiring about any potential changes to medications at his upcoming PCP visit on 7/15.  Patient states his Ozempic  2mg  will be coming due for refill around the time of his follow-up visit, and he does not want to refill if medication may be changed.  -Patient is currently using the following for control of diabetes:  Ozempic  2mg  weekly, metformin  1000mg  BID  -Medications previously tried:  Jardiance  stopped earlier this year due to genital yeast infection, which has now resolved.  Previously on Farxiga , but this was no longer covered by patient's insurance.  Patient has also tried Trulicity  at max dose with insufficient clinical response. -A1c not at goal of <7% even with Ozemic and metformin  at max daily dosing; last two A1c results were 7.7% -I recommend changing patient to Mounjaro 7.5mg  weekly, increasing dose every 4 weeks as needed and tolerated for additional diabetes control -Prior authorization submitted to insurance via CoverMyMeds KEY [id number]- approved as of 7/3  [name], PharmD, DPLA

## 2023-07-25 ENCOUNTER — Telehealth: Payer: Self-pay

## 2023-07-25 NOTE — Progress Notes (Signed)
   07/25/2023  Patient ID: Joshua [employer], male   DOB: [birth year], 58 y.o.   MRN: [id number]  Returning missed call/voicemail from patient.  Patient stating pharmacy already refilled Ozempic  2mg , and he is wondering if he should pick medication up or wait until his visit with Dr. Vicci since he still has 2 pens left.  Advised patient to hold off on picking Ozempic  2mg  up until A1c evaluated at upcoming visit, because medication may be changed to Mounjaro.  [name], PharmD, DPLA

## 2023-08-02 ENCOUNTER — Encounter: Payer: Self-pay | Admitting: Family Medicine

## 2023-08-02 ENCOUNTER — Ambulatory Visit: Admitting: Family Medicine

## 2023-08-02 VITALS — BP 113/69 | HR 85 | Temp 98.0°F | Resp 15 | Ht 67.99 in | Wt 251.2 lb

## 2023-08-02 DIAGNOSIS — Z9189 Other specified personal risk factors, not elsewhere classified: Secondary | ICD-10-CM | POA: Diagnosis not present

## 2023-08-02 DIAGNOSIS — Z23 Encounter for immunization: Secondary | ICD-10-CM | POA: Diagnosis not present

## 2023-08-02 DIAGNOSIS — N183 Chronic kidney disease, stage 3 unspecified: Secondary | ICD-10-CM | POA: Diagnosis not present

## 2023-08-02 DIAGNOSIS — E1122 Type 2 diabetes mellitus with diabetic chronic kidney disease: Secondary | ICD-10-CM | POA: Diagnosis not present

## 2023-08-02 DIAGNOSIS — Z789 Other specified health status: Secondary | ICD-10-CM

## 2023-08-02 DIAGNOSIS — B3749 Other urogenital candidiasis: Secondary | ICD-10-CM

## 2023-08-02 LAB — BAYER DCA HB A1C WAIVED: HB A1C (BAYER DCA - WAIVED): 8 % — ABNORMAL HIGH (ref 4.8–5.6)

## 2023-08-02 MED ORDER — TIRZEPATIDE 7.5 MG/0.5ML ~~LOC~~ SOAJ: 7.5000 mg | SUBCUTANEOUS | 1 refills | Status: DC

## 2023-08-02 NOTE — Assessment & Plan Note (Signed)
 Up to 8.0 off the jardiance . Able to get mounjaro  approved. Has 2 months of ozempic  2mg  left at home- OK to finish and then start mounjaro . Recheck 3 months. Call with any concerns.

## 2023-08-02 NOTE — Progress Notes (Signed)
 BP 113/69 (BP Location: Left Arm, Patient Position: Sitting, Cuff Size: Large)   Pulse 85   Temp 98 F (36.7 C) (Oral)   Resp 15   Ht 5' 7.99 (1.727 m)   Wt 251 lb 3.2 oz (113.9 kg)   SpO2 97%   BMI 38.20 kg/m    Subjective:    Patient ID: Joshua [employer], male    DOB: 24-Jan-1965, 58 y.o.   MRN: [id number]  HPI: GEOFFREY HYNES is a 58 y.o. male  Chief Complaint  Patient presents with   Measles titer    Would like to check immunity   Diabetes    Once daily ranges after dinner 130-180. Pending possible change from Ozempic  to Mounjaro . Also wonders about Jardiance  and being off it has it played any role in his numbers being up and down.    DIABETES Hypoglycemic episodes:no Polydipsia/polyuria: no Visual disturbance: no Chest pain: no Paresthesias: no Glucose Monitoring: yes Taking Insulin ?: no Blood Pressure Monitoring: not checking Retinal Examination: Up to Date Foot Exam: Up to Date Diabetic Education: Completed Pneumovax: Up to Date Influenza: Up to Date Aspirin : yes  Relevant past medical, surgical, family and social history reviewed and updated as indicated. Interim medical history since our last visit reviewed. Allergies and medications reviewed and updated.  Review of Systems  Constitutional: Negative.   Respiratory: Negative.    Cardiovascular: Negative.   Musculoskeletal: Negative.   Skin:  Positive for rash. Negative for color change, pallor and wound.  Psychiatric/Behavioral: Negative.      Per HPI unless specifically indicated above     Objective:    BP 113/69 (BP Location: Left Arm, Patient Position: Sitting, Cuff Size: Large)   Pulse 85   Temp 98 F (36.7 C) (Oral)   Resp 15   Ht 5' 7.99 (1.727 m)   Wt 251 lb 3.2 oz (113.9 kg)   SpO2 97%   BMI 38.20 kg/m   Wt Readings from Last 3 Encounters:  08/02/23 251 lb 3.2 oz (113.9 kg)  06/14/23 245 lb 3.2 oz (111.2 kg)  05/05/23 238 lb (108 kg)    Physical Exam Vitals and nursing  note reviewed.  Constitutional:      General: He is not in acute distress.    Appearance: Normal appearance. He is obese. He is not ill-appearing, toxic-appearing or diaphoretic.  HENT:     Head: Normocephalic and atraumatic.     Right Ear: External ear normal.     Left Ear: External ear normal.     Nose: Nose normal.     Mouth/Throat:     Mouth: Mucous membranes are moist.     Pharynx: Oropharynx is clear.  Eyes:     General: No scleral icterus.       Right eye: No discharge.        Left eye: No discharge.     Extraocular Movements: Extraocular movements intact.     Conjunctiva/sclera: Conjunctivae normal.     Pupils: Pupils are equal, round, and reactive to light.  Cardiovascular:     Rate and Rhythm: Normal rate and regular rhythm.     Pulses: Normal pulses.     Heart sounds: Normal heart sounds. No murmur heard.    No friction rub. No gallop.  Pulmonary:     Effort: Pulmonary effort is normal. No respiratory distress.     Breath sounds: Normal breath sounds. No stridor. No wheezing, rhonchi or rales.  Chest:     Chest wall:  No tenderness.  Musculoskeletal:        General: Normal range of motion.     Cervical back: Normal range of motion and neck supple.  Skin:    General: Skin is warm and dry.     Capillary Refill: Capillary refill takes less than 2 seconds.     Coloration: Skin is not jaundiced or pale.     Findings: No bruising, erythema, lesion or rash.  Neurological:     General: No focal deficit present.     Mental Status: He is alert and oriented to person, place, and time. Mental status is at baseline.  Psychiatric:        Mood and Affect: Mood normal.        Behavior: Behavior normal.        Thought Content: Thought content normal.        Judgment: Judgment normal.     Results for orders placed or performed in visit on 05/04/23  Bayer DCA Hb A1c Waived   Collection Time: 05/04/23 10:51 AM  Result Value Ref Range   HB A1C (BAYER DCA - WAIVED) 7.7 (H) 4.8  - 5.6 %  CBC with Differential/Platelet   Collection Time: 05/04/23 10:53 AM  Result Value Ref Range   WBC 10.5 3.4 - 10.8 x10E3/uL   RBC 5.32 4.14 - 5.80 x10E6/uL   Hemoglobin 14.5 13.0 - 17.7 g/dL   Hematocrit 55.3 62.4 - 51.0 %   MCV 84 79 - 97 fL   MCH 27.3 26.6 - 33.0 pg   MCHC 32.5 31.5 - 35.7 g/dL   RDW 85.4 88.3 - 84.5 %   Platelets 279 150 - 450 x10E3/uL   Neutrophils 75 Not Estab. %   Lymphs 16 Not Estab. %   Monocytes 7 Not Estab. %   Eos 1 Not Estab. %   Basos 0 Not Estab. %   Neutrophils Absolute 7.9 (H) 1.4 - 7.0 x10E3/uL   Lymphocytes Absolute 1.7 0.7 - 3.1 x10E3/uL   Monocytes Absolute 0.7 0.1 - 0.9 x10E3/uL   EOS (ABSOLUTE) 0.1 0.0 - 0.4 x10E3/uL   Basophils Absolute 0.0 0.0 - 0.2 x10E3/uL   Immature Granulocytes 1 Not Estab. %   Immature Grans (Abs) 0.1 0.0 - 0.1 x10E3/uL  Lipid Panel w/o Chol/HDL Ratio   Collection Time: 05/04/23 10:53 AM  Result Value Ref Range   Cholesterol, Total 94 (L) 100 - 199 mg/dL   Triglycerides 886 0 - 149 mg/dL   HDL 27 (L) >60 mg/dL   VLDL Cholesterol Cal 21 5 - 40 mg/dL   LDL Chol Calc (NIH) 46 0 - 99 mg/dL  Comprehensive metabolic panel with GFR   Collection Time: 05/04/23 10:53 AM  Result Value Ref Range   Glucose 193 (H) 70 - 99 mg/dL   BUN 13 6 - 24 mg/dL   Creatinine, Ser 9.18 0.76 - 1.27 mg/dL   eGFR 896 >40 fO/fpw/8.26   BUN/Creatinine Ratio 16 9 - 20   Sodium 135 134 - 144 mmol/L   Potassium 4.8 3.5 - 5.2 mmol/L   Chloride 96 96 - 106 mmol/L   CO2 25 20 - 29 mmol/L   Calcium  9.2 8.7 - 10.2 mg/dL   Total Protein 6.7 6.0 - 8.5 g/dL   Albumin 4.2 3.8 - 4.9 g/dL   Globulin, Total 2.5 1.5 - 4.5 g/dL   Bilirubin Total 0.5 0.0 - 1.2 mg/dL   Alkaline Phosphatase 56 44 - 121 IU/L   AST 28 0 - 40  IU/L   ALT 26 0 - 44 IU/L      Assessment & Plan:   Problem List Items Addressed This Visit       Endocrine   Type 2 diabetes mellitus with stage 3 chronic kidney disease, without long-term current use of insulin   (HCC) - Primary   Up to 8.0 off the jardiance . Able to get mounjaro  approved. Has 2 months of ozempic  2mg  left at home- OK to finish and then start mounjaro . Recheck 3 months. Call with any concerns.       Relevant Medications   tirzepatide  (MOUNJARO ) 7.5 MG/0.5ML Pen   Other Relevant Orders   Bayer DCA Hb A1c Waived   Pneumococcal conjugate vaccine 20-valent (Prevnar 20)   Other Visit Diagnoses       Candida infection of genital region       Continues, but better. Continue nystatin  and will work on decreasing sugars. Call with any concerns.     At increased risk for exposure to measles virus       Will check titer. Await results.   Relevant Orders   Measles/Mumps/Rubella Immunity     Hepatitis B vaccination status unknown       Will check titer. Await results.   Relevant Orders   Hepatitis B surface antibody,quantitative        Follow up plan: Return in about 3 months (around 11/02/2023) for physical.

## 2023-08-03 DIAGNOSIS — M4306 Spondylolysis, lumbar region: Secondary | ICD-10-CM | POA: Diagnosis not present

## 2023-08-03 DIAGNOSIS — M9905 Segmental and somatic dysfunction of pelvic region: Secondary | ICD-10-CM | POA: Diagnosis not present

## 2023-08-03 DIAGNOSIS — M542 Cervicalgia: Secondary | ICD-10-CM | POA: Diagnosis not present

## 2023-08-03 DIAGNOSIS — M955 Acquired deformity of pelvis: Secondary | ICD-10-CM | POA: Diagnosis not present

## 2023-08-03 DIAGNOSIS — M9902 Segmental and somatic dysfunction of thoracic region: Secondary | ICD-10-CM | POA: Diagnosis not present

## 2023-08-03 DIAGNOSIS — M62830 Muscle spasm of back: Secondary | ICD-10-CM | POA: Diagnosis not present

## 2023-08-03 DIAGNOSIS — M9904 Segmental and somatic dysfunction of sacral region: Secondary | ICD-10-CM | POA: Diagnosis not present

## 2023-08-03 DIAGNOSIS — M9901 Segmental and somatic dysfunction of cervical region: Secondary | ICD-10-CM | POA: Diagnosis not present

## 2023-08-03 DIAGNOSIS — M5418 Radiculopathy, sacral and sacrococcygeal region: Secondary | ICD-10-CM | POA: Diagnosis not present

## 2023-08-03 DIAGNOSIS — M5416 Radiculopathy, lumbar region: Secondary | ICD-10-CM | POA: Diagnosis not present

## 2023-08-03 DIAGNOSIS — M9903 Segmental and somatic dysfunction of lumbar region: Secondary | ICD-10-CM | POA: Diagnosis not present

## 2023-08-03 LAB — MEASLES/MUMPS/RUBELLA IMMUNITY
MUMPS ABS, IGG: 241 [AU]/ml (ref >10.9)
RUBEOLA AB, IGG: >300 [AU]/ml (ref >16.4)
Rubella Antibodies, IGG: 12.9 {index} (ref >0.99)

## 2023-08-03 LAB — HEPATITIS B SURFACE ANTIBODY, QUANTITATIVE: Hepatitis B Surf Ab Quant: <3.5 m[IU]/mL — ABNORMAL LOW

## 2023-08-04 ENCOUNTER — Ambulatory Visit: Admitting: Cardiovascular Disease

## 2023-08-04 ENCOUNTER — Encounter: Payer: Self-pay | Admitting: Cardiovascular Disease

## 2023-08-04 VITALS — BP 120/79 | HR 84 | Ht 67.0 in | Wt 251.4 lb

## 2023-08-04 DIAGNOSIS — E782 Mixed hyperlipidemia: Secondary | ICD-10-CM

## 2023-08-04 DIAGNOSIS — N182 Chronic kidney disease, stage 2 (mild): Secondary | ICD-10-CM

## 2023-08-04 DIAGNOSIS — E1122 Type 2 diabetes mellitus with diabetic chronic kidney disease: Secondary | ICD-10-CM | POA: Diagnosis not present

## 2023-08-04 DIAGNOSIS — I10 Essential (primary) hypertension: Secondary | ICD-10-CM | POA: Diagnosis not present

## 2023-08-04 DIAGNOSIS — N183 Chronic kidney disease, stage 3 unspecified: Secondary | ICD-10-CM

## 2023-08-04 DIAGNOSIS — I340 Nonrheumatic mitral (valve) insufficiency: Secondary | ICD-10-CM | POA: Diagnosis not present

## 2023-08-04 NOTE — Progress Notes (Signed)
 Cardiology Office Note   Date:  08/04/2023   ID:  Joshua Lee, DOB [birth year], MRN [id number]  PCP:  Vicci Duwaine SQUIBB, DO  Cardiologist:  Denyse Bathe, MD      History of Present Illness: Joshua Lee is a 58 y.o. male who presents for  Chief Complaint  Patient presents with   Follow-up    3 month follow up    Doing well, no chest pain and SOB       Past Medical History:  Diagnosis Date   Diabetes mellitus without complication (HCC)    GERD (gastroesophageal reflux disease)    Hyperlipidemia    Hypertension    Kidney stones    Plantar fascial fibromatosis      Past Surgical History:  Procedure Laterality Date   CHOLECYSTECTOMY  2006   GALLBLADDER SURGERY     KIDNEY STONE SURGERY     KNEE ARTHROSCOPY     TOTAL HIP ARTHROPLASTY Right 07/08/2015   Procedure: TOTAL HIP ARTHROPLASTY ANTERIOR APPROACH;  Surgeon: Ozell Flake, MD;  Location: [facility] ORS;  Service: Orthopedics;  Laterality: Right;     Current Outpatient Medications  Medication Sig Dispense Refill   acetaminophen  (TYLENOL ) 650 MG CR tablet Take 650-1,950 mg by mouth every 8 (eight) hours as needed for pain.     amLODipine  (NORVASC ) 2.5 MG tablet Take 1 tablet (2.5 mg total) by mouth daily. 90 tablet 1   aspirin  81 MG tablet Take 81 mg by mouth daily.     atorvastatin  (LIPITOR) 80 MG tablet Take 1 tablet (80 mg total) by mouth daily. 90 tablet 1   benazepril  (LOTENSIN ) 40 MG tablet Take 1 tablet (40 mg total) by mouth daily. 90 tablet 1   Blood Glucose Monitoring Suppl (ACCU-CHEK GUIDE ME) w/Device KIT Use to check blood glucose two times daily 1 kit 0   Coenzyme Q-10 100 MG capsule Take 100 mg by mouth daily.     cyclobenzaprine  (FLEXERIL ) 10 MG tablet Take 1 tablet (10 mg total) by mouth at bedtime. 30 tablet 1   FLUoxetine  (PROZAC ) 10 MG capsule Take 1 capsule (10 mg total) by mouth daily. 30 capsule 3   fluticasone  (FLONASE ) 50 MCG/ACT nasal spray Place 1 spray into both nostrils 2 (two)  times daily. 48 mL 4   gabapentin (NEURONTIN) 300 MG capsule Take 300 mg by mouth 3 (three) times daily as needed.     Garlic 1000 MG CAPS Take 1,000 mg by mouth daily.     glucose blood (ACCU-CHEK GUIDE TEST) test strip 1 each by Other route 2 (two) times daily. 200 each 3   icosapent  Ethyl (VASCEPA ) 1 g capsule Take 2 capsules (2 g total) by mouth 2 (two) times daily. 360 capsule 1   metFORMIN  (GLUCOPHAGE ) 500 MG tablet TAKE 2 TABLETS(1000 MG) BY MOUTH TWICE DAILY WITH A MEAL 360 tablet 1   metoprolol  tartrate (LOPRESSOR ) 100 MG tablet Take 1 tablet (100 mg total) by mouth 2 (two) times daily. 180 tablet 1   nystatin  (MYCOSTATIN /NYSTOP ) powder Apply 1 Application topically 3 (three) times daily. 60 g 3   Potassium Citrate 15 MEQ (1620 MG) TBCR Take 15 mEq by mouth 2 (two) times daily. 2 tab QAM, 1 tab QPM  1   saline (AYR) GEL Place 1 Application into both nostrils every 6 (six) hours as needed. 14.1 g 6   tamsulosin  (FLOMAX ) 0.4 MG CAPS capsule TAKE 1 CAPSULE BY MOUTH EVERY DAY 90 capsule 1  tirzepatide  (MOUNJARO ) 7.5 MG/0.5ML Pen Inject 7.5 mg into the skin once a week. 2 mL 1   traMADol  (ULTRAM ) 50 MG tablet Take 50 mg by mouth every 12 (twelve) hours as needed.     Accu-Chek Softclix Lancets lancets Use to check blood glucose two times daily 100 each 12   diclofenac  Sodium (VOLTAREN ) 1 % GEL Apply 4 g topically 4 (four) times daily. (Patient not taking: Reported on 08/04/2023) 350 g 12   No current facility-administered medications for this visit.    Allergies:   Penicillins    Social History:   reports that he has never smoked. His smokeless tobacco use includes snuff. He reports that he does not currently use alcohol. He reports that he does not use drugs.   Family History:  family history includes Diabetes in his maternal uncle.    ROS:     Review of Systems  Constitutional: Negative.   HENT: Negative.    Eyes: Negative.   Respiratory: Negative.    Gastrointestinal:  Negative.   Genitourinary: Negative.   Musculoskeletal: Negative.   Skin: Negative.   Neurological: Negative.   Endo/Heme/Allergies: Negative.   Psychiatric/Behavioral: Negative.    All other systems reviewed and are negative.     All other systems are reviewed and negative.    PHYSICAL EXAM: VS:  BP 120/79   Pulse 84   Ht 5' 7 (1.702 m)   Wt 251 lb 6.4 oz (114 kg)   SpO2 97%   BMI 39.37 kg/m  , BMI Body mass index is 39.37 kg/m. Last weight:  Wt Readings from Last 3 Encounters:  08/04/23 251 lb 6.4 oz (114 kg)  08/02/23 251 lb 3.2 oz (113.9 kg)  06/14/23 245 lb 3.2 oz (111.2 kg)     Physical Exam Vitals reviewed.  Constitutional:      Appearance: Normal appearance. He is normal weight.  HENT:     Head: Normocephalic.     Nose: Nose normal.     Mouth/Throat:     Mouth: Mucous membranes are moist.  Eyes:     Pupils: Pupils are equal, round, and reactive to light.  Cardiovascular:     Rate and Rhythm: Normal rate and regular rhythm.     Pulses: Normal pulses.     Heart sounds: Normal heart sounds.  Pulmonary:     Effort: Pulmonary effort is normal.  Abdominal:     General: Abdomen is flat. Bowel sounds are normal.  Musculoskeletal:        General: Normal range of motion.     Cervical back: Normal range of motion.  Skin:    General: Skin is warm.  Neurological:     General: No focal deficit present.     Mental Status: He is alert.  Psychiatric:        Mood and Affect: Mood normal.       EKG:   Recent Labs: 10/21/2022: TSH 2.390 05/04/2023: ALT 26; BUN 13; Creatinine, Ser 0.81; Hemoglobin 14.5; Platelets 279; Potassium 4.8; Sodium 135    Lipid Panel    Component Value Date/Time   CHOL 94 (L) 05/04/2023 1053   CHOL 108 08/29/2018 1045   TRIG 113 05/04/2023 1053   TRIG 221 (H) 08/29/2018 1045   HDL 27 (L) 05/04/2023 1053   CHOLHDL 6.5 (H) 02/09/2017 1002   VLDL 44 (H) 08/29/2018 1045   LDLCALC 46 05/04/2023 1053      Other studies  Reviewed: Additional studies/ records that were reviewed today  include:  Review of the above records demonstrates:       No data to display            ASSESSMENT AND PLAN:    ICD-10-CM   1. Mixed hyperlipidemia  E78.2     2. Primary hypertension  I10    Normal LVEF, but diastolic dyfunction, but had rash with jaurdiance.    3. Nonrheumatic mitral valve regurgitation  I34.0    Trace    4. CKD (chronic kidney disease), stage II  N18.2     5. Type 2 diabetes mellitus with stage 3 chronic kidney disease, without long-term current use of insulin , unspecified whether stage 3a or 3b CKD (HCC)  E11.22    N18.30     6. Morbid obesity (HCC)  E66.01        Problem List Items Addressed This Visit       Cardiovascular and Mediastinum   Hypertension     Endocrine   Type 2 diabetes mellitus with stage 3 chronic kidney disease, without long-term current use of insulin  (HCC)     Genitourinary   CKD (chronic kidney disease), stage II     Other   Hyperlipidemia - Primary   Morbid obesity (HCC)   Other Visit Diagnoses       Nonrheumatic mitral valve regurgitation       Trace          Disposition:   Return in about 3 months (around 11/04/2023).    Total time spent: 35 minutes  Signed,  Denyse Bathe, MD  08/04/2023 9:20 AM    [facility]

## 2023-08-10 ENCOUNTER — Ambulatory Visit: Payer: Self-pay | Admitting: Family Medicine

## 2023-08-11 DIAGNOSIS — R509 Fever, unspecified: Secondary | ICD-10-CM | POA: Diagnosis not present

## 2023-08-11 DIAGNOSIS — J019 Acute sinusitis, unspecified: Secondary | ICD-10-CM | POA: Diagnosis not present

## 2023-08-11 DIAGNOSIS — B9689 Other specified bacterial agents as the cause of diseases classified elsewhere: Secondary | ICD-10-CM | POA: Diagnosis not present

## 2023-08-11 DIAGNOSIS — R051 Acute cough: Secondary | ICD-10-CM | POA: Diagnosis not present

## 2023-08-11 DIAGNOSIS — J189 Pneumonia, unspecified organism: Secondary | ICD-10-CM | POA: Diagnosis not present

## 2023-08-11 DIAGNOSIS — Z03818 Encounter for observation for suspected exposure to other biological agents ruled out: Secondary | ICD-10-CM | POA: Diagnosis not present

## 2023-08-12 ENCOUNTER — Ambulatory Visit (INDEPENDENT_AMBULATORY_CARE_PROVIDER_SITE_OTHER)

## 2023-08-12 DIAGNOSIS — Z23 Encounter for immunization: Secondary | ICD-10-CM | POA: Diagnosis not present

## 2023-08-15 ENCOUNTER — Telehealth: Payer: Self-pay

## 2023-08-15 NOTE — Progress Notes (Signed)
   08/15/2023  Patient ID: Joshua Lee, male   DOB: 1965/12/09, 58 y.o.   MRN: [id number]  Returning missed call/voicemail from Friday afternoon.  I was not able to reach Joshua Lee but did leave a HIPAA compliant voicemail with my direct phone number  [name], PharmD, DPLA

## 2023-08-15 NOTE — Progress Notes (Signed)
   08/15/2023  Patient ID: [name], male   DOB: 10/09/1965, 58 y.o.   MRN: [id number]  Patient returning my call from earlier.  Mr. Harton is calling with concern in regard to recently elevated BG readings.  He was recently diagnosed with pneumonia, and has been taking benzonatate 200mg  PRN, moxifloxacin 400mg  daily, and prednisone  20mg  BID.  Tomorrow will be his last dose of prednisone , but he endorses BG readings 270-300 while on the steroid.  BG readings prior to starting prednisone  were also elevated around 200.  Patient is currently completing Ozempic  2mg  on hand before switching to Mounjaro  7.5mg .  He has approximately 6 weeks left of Ozempic  2mg .  Informed him that once prednisone  is completed, BG will start to come back down in a few days.  I also expect improved readings when he switches to Mounjaro  7.5mg  weekly.  Telephone follow-up already scheduled with patient for 8/13.  [name], PharmD, DPLA

## 2023-08-17 DIAGNOSIS — E119 Type 2 diabetes mellitus without complications: Secondary | ICD-10-CM | POA: Diagnosis not present

## 2023-08-17 DIAGNOSIS — M5416 Radiculopathy, lumbar region: Secondary | ICD-10-CM | POA: Diagnosis not present

## 2023-08-26 ENCOUNTER — Ambulatory Visit: Admitting: Podiatry

## 2023-08-26 ENCOUNTER — Encounter: Payer: Self-pay | Admitting: Podiatry

## 2023-08-26 DIAGNOSIS — M79675 Pain in left toe(s): Secondary | ICD-10-CM | POA: Diagnosis not present

## 2023-08-26 DIAGNOSIS — B351 Tinea unguium: Secondary | ICD-10-CM | POA: Diagnosis not present

## 2023-08-26 DIAGNOSIS — M79674 Pain in right toe(s): Secondary | ICD-10-CM | POA: Diagnosis not present

## 2023-08-29 DIAGNOSIS — N200 Calculus of kidney: Secondary | ICD-10-CM | POA: Diagnosis not present

## 2023-08-30 NOTE — Progress Notes (Signed)
   08/30/2023  Patient ID: Joshua [employer], male   DOB: [birth year], 58 y.o.   MRN: [id number]  Telephone visit to follow-up on management of type 2 diabetes   Diabetes management plan -Current medications: metformin  1000 mg 2 times daily, Ozempic  2 mg weekly -Jardiance  25mg  stopped a couple of months ago based on yeast infection, and Ozempic  increased to 2mg   -Patient will complete Ozempic  2mg  on hand (4 injections left) and then switch to Mounjaro  7.5mg  weekly in hopes of obtaining improved diabetes control -Last A1c remained steady at 7.7% -Checking home BG regularly, and states readings are still elevated after completing steroid approximately 2 weeks ago.  FBG this morning was 175. -Does not endorse any s/sx of hypoglycemia   Assessment/plan   Diabetes management plan -Continue current regimen at this time- patient will take first dose of Mounjaro  7.5mg  9/10 -Goal to titrate Mounjaro  dosing as frequently as every 4 weeks based on tolerance and efficacy -Continue to monitor FBG and record values  -Sees PCP again 10/23 -Recommended dietary modifications, including decreasing carbohydrate intake and sugary foods (sweet tea and lemon cookies at night) -Could consider retial of Jardiance , or switching patient back to Farxiga , which he was on in the past if additional BG control is needed.  Could likely get PA approved for Farxiga  if needed based on trial/failure of Jardiance    Follow-up: 6 weeks   Joshua Lee, PharmD, DPLA

## 2023-08-30 NOTE — Progress Notes (Signed)
 Subjective:  Patient ID: Joshua Lee, male    DOB: 1965-03-05,  MRN: [id number]  58 y.o. male presents at risk foot care with history of diabetic neuropathy and painful mycotic toenails of both feet that are difficult to trim. Pain interferes with daily activities and wearing enclosed shoe gear comfortably. Patient states both 2nd digits are sore. He has not noticed any drainage or swelling. Chief Complaint  Patient presents with   [facility]    Rm3 Diabetic foot care Dr. Vicci July 2025/ Blood sugar 189/ A1c 8   New problem(s): None   PCP is Johnson, [employer], DO. Allergies  Allergen Reactions   Penicillins Rash    Has patient had a PCN reaction causing immediate rash, facial/tongue/throat swelling, SOB or lightheadedness with hypotension: unknown Has patient had a PCN reaction causing severe rash involving mucus membranes or skin necrosis: unknown Has patient had a PCN reaction that required hospitalization: unknown Has patient had a PCN reaction occurring within the last 10 years: no If all of the above answers are NO, then may proceed with Cephalosporin use.     Review of Systems: Negative except as noted in the HPI.   Objective:  Joshua Lee is a pleasant 58 y.o. male obese in NAD. AAO x 3.  Vascular Examination: Vascular status intact b/l with palpable pedal pulses. CFT immediate b/l. Pedal hair present. No edema. No pain with calf compression b/l. Skin temperature gradient WNL b/l. No varicosities noted. No cyanosis or clubbing noted.  Neurological Examination: Sensation grossly intact b/l with 10 gram monofilament. Vibratory sensation intact b/l.  Dermatological Examination: Pedal skin with normal turgor, texture and tone b/l. No open wounds nor interdigital macerations noted. Toenails 1-5 b/l thick, discolored, elongated with subungual debris and pain on dorsal palpation. No hyperkeratotic lesions noted b/l. Bilateral 2nd digits are mildly incurvated. +Tenderness to  palpation. No erythema, no edema, no drainage.  Musculoskeletal Examination: Muscle strength 5/5 to b/l LE.  No pain, crepitus noted b/l. No gross pedal deformities. Patient ambulates independently without assistive aids.   Radiographs: None  Last A1c:      Latest Ref Rng & Units 08/02/2023    9:11 AM 05/04/2023   10:51 AM 01/21/2023   10:58 AM 10/21/2022   11:26 AM  Hemoglobin A1C  Hemoglobin-A1c 4.8 - 5.6 % 8.0  7.7  7.7  7.6      Assessment:   1. Pain due to onychomycosis of toenails of both feet    Plan:  Patient was evaluated and treated. All patient's and/or POA's questions/concerns addressed on today's visit. Toenails bilateral great toes and 3-5 bilaterally debrided in length and girth without incident. Continue foot and shoe inspections daily. Monitor blood glucose per PCP/Endocrinologist's recommendations. Continue soft, supportive shoe gear daily. Report any pedal injuries to medical professional. Call office if there are any questions/concerns. -No invasive procedure(s) performed. Offending nail border debrided and curretaged both nail borders of bilateral 2nd toes utilizing sterile nail nipper and currette. Border(s) cleansed with alcohol and triple antibiotic ointment applied. Patient/POA/Caregiver/Facility instructed to apply Neosporin  Cream  to bilateral 2nd toes once daily for 7 days. Call office if there are any concerns. See Dr. Janit if he continues to have problems.  Return in about 3 months (around 11/26/2023).  Joshua Lee, DPM       LOCATION: [address]  [place], [place] [address]                   Office [phone]   [facility] LOCATION: [address] [place], [place] [address] Office [phone]

## 2023-08-31 ENCOUNTER — Other Ambulatory Visit: Payer: Self-pay

## 2023-08-31 DIAGNOSIS — M5418 Radiculopathy, sacral and sacrococcygeal region: Secondary | ICD-10-CM | POA: Diagnosis not present

## 2023-08-31 DIAGNOSIS — M542 Cervicalgia: Secondary | ICD-10-CM | POA: Diagnosis not present

## 2023-08-31 DIAGNOSIS — M9902 Segmental and somatic dysfunction of thoracic region: Secondary | ICD-10-CM | POA: Diagnosis not present

## 2023-08-31 DIAGNOSIS — M9904 Segmental and somatic dysfunction of sacral region: Secondary | ICD-10-CM | POA: Diagnosis not present

## 2023-08-31 DIAGNOSIS — M5416 Radiculopathy, lumbar region: Secondary | ICD-10-CM | POA: Diagnosis not present

## 2023-08-31 DIAGNOSIS — M9905 Segmental and somatic dysfunction of pelvic region: Secondary | ICD-10-CM | POA: Diagnosis not present

## 2023-08-31 DIAGNOSIS — M9901 Segmental and somatic dysfunction of cervical region: Secondary | ICD-10-CM | POA: Diagnosis not present

## 2023-08-31 DIAGNOSIS — M9903 Segmental and somatic dysfunction of lumbar region: Secondary | ICD-10-CM | POA: Diagnosis not present

## 2023-08-31 DIAGNOSIS — M62830 Muscle spasm of back: Secondary | ICD-10-CM | POA: Diagnosis not present

## 2023-08-31 DIAGNOSIS — M4306 Spondylolysis, lumbar region: Secondary | ICD-10-CM | POA: Diagnosis not present

## 2023-08-31 DIAGNOSIS — M955 Acquired deformity of pelvis: Secondary | ICD-10-CM | POA: Diagnosis not present

## 2023-09-06 ENCOUNTER — Other Ambulatory Visit: Payer: Self-pay | Admitting: Family Medicine

## 2023-09-06 DIAGNOSIS — E1122 Type 2 diabetes mellitus with diabetic chronic kidney disease: Secondary | ICD-10-CM

## 2023-09-06 DIAGNOSIS — I10 Essential (primary) hypertension: Secondary | ICD-10-CM

## 2023-09-08 NOTE — Telephone Encounter (Signed)
 Too soon for refill, LRF 05/04/23 for 90 and 1 RF.  Requested Prescriptions  Pending Prescriptions Disp Refills   benazepril  (LOTENSIN ) 40 MG tablet [Pharmacy Med Name: BENAZEPRIL  HCL 40 MG TABLET] 90 tablet 1    Sig: TAKE 1 TABLET BY MOUTH EVERY DAY     Cardiovascular:  ACE Inhibitors Passed - 09/08/2023 12:15 PM      Passed - Cr in normal range and within 180 days    Creatinine, Ser  Date Value Ref Range Status  05/04/2023 0.81 0.76 - 1.27 mg/dL Final         Passed - K in normal range and within 180 days    Potassium  Date Value Ref Range Status  05/04/2023 4.8 3.5 - 5.2 mmol/L Final         Passed - Patient is not pregnant      Passed - Last BP in normal range    BP Readings from Last 1 Encounters:  08/04/23 120/79         Passed - Valid encounter within last 6 months    Recent Outpatient Visits           1 month ago Type 2 diabetes mellitus with stage 3 chronic kidney disease, without long-term current use of insulin , unspecified whether stage 3a or 3b CKD (HCC)   [place] [facility] [place], [name], DO   2 months ago Type 2 diabetes mellitus with stage 3 chronic kidney disease, without long-term current use of insulin , unspecified whether stage 3a or 3b CKD (HCC)   [place] [facility] [place], [name], DO   4 months ago Type 2 diabetes mellitus with stage 3 chronic kidney disease, without long-term current use of insulin , unspecified whether stage 3a or 3b CKD (HCC)    [place], Duwaine SQUIBB, DO       Future Appointments             In 1 month Fernand Denyse LABOR, MD [facility]             metFORMIN  (GLUCOPHAGE ) 500 MG tablet [Pharmacy Med Name: METFORMIN  HCL 500 MG TABLET] 360 tablet 1    Sig: TAKE 2 TABLETS(1000 MG) BY MOUTH TWICE DAILY WITH A MEAL     Endocrinology:  Diabetes - Biguanides Failed - 09/08/2023 12:15 PM      Failed - HBA1C is between 0 and 7.9 and within 180 days     Hemoglobin A1C  Date Value Ref Range Status  09/04/2015 6.9  Final  09/04/2015 6.9  Final   HB A1C (BAYER DCA - WAIVED)  Date Value Ref Range Status  08/02/2023 8.0 (H) 4.8 - 5.6 % Final    Comment:             Prediabetes: 5.7 - 6.4          Diabetes: >6.4          Glycemic control for adults with diabetes: <7.0          Failed - B12 Level in normal range and within 720 days    No results found for: VITAMINB12       Passed - Cr in normal range and within 360 days    Creatinine, Ser  Date Value Ref Range Status  05/04/2023 0.81 0.76 - 1.27 mg/dL Final         Passed - eGFR in normal range and within 360 days    GFR  calc Af Amer  Date Value Ref Range Status  02/28/2020 114 >59 mL/min/1.73 Final    Comment:    **In accordance with recommendations from the NKF-ASN Task force,**   [facility] is in the process of updating its eGFR calculation to the   2021 CKD-EPI creatinine equation that estimates kidney function   without a race variable.    GFR, Estimated  Date Value Ref Range Status  03/10/2023 >60 >60 mL/min Final    Comment:    (NOTE) Calculated using the CKD-EPI Creatinine Equation (2021)    eGFR  Date Value Ref Range Status  05/04/2023 103 >59 mL/min/1.73 Final         Passed - Valid encounter within last 6 months    Recent Outpatient Visits           1 month ago Type 2 diabetes mellitus with stage 3 chronic kidney disease, without long-term current use of insulin , unspecified whether stage 3a or 3b CKD (HCC)   [place] [facility], [name], DO   2 months ago Type 2 diabetes mellitus with stage 3 chronic kidney disease, without long-term current use of insulin , unspecified whether stage 3a or 3b CKD (HCC)   [place] [facility] [place], [name], DO   4 months ago Type 2 diabetes mellitus with stage 3 chronic kidney disease, without long-term current use of insulin , unspecified whether stage 3a or 3b CKD (HCC)     [facility] [place], [place], DO       Future Appointments             In 1 month Fernand Denyse LABOR, MD [facility]            Passed - CBC within normal limits and completed in the last 12 months    WBC  Date Value Ref Range Status  05/04/2023 10.5 3.4 - 10.8 x10E3/uL Final  03/10/2023 10.3 4.0 - 10.5 K/uL Final   RBC  Date Value Ref Range Status  05/04/2023 5.32 4.14 - 5.80 x10E6/uL Final  03/10/2023 5.22 4.22 - 5.81 MIL/uL Final   Hemoglobin  Date Value Ref Range Status  05/04/2023 14.5 13.0 - 17.7 g/dL Final   Hematocrit  Date Value Ref Range Status  05/04/2023 44.6 37.5 - 51.0 % Final   MCHC  Date Value Ref Range Status  05/04/2023 32.5 31.5 - 35.7 g/dL Final  97/79/7974 68.3 30.0 - 36.0 g/dL Final   [facility]  Date Value Ref Range Status  05/04/2023 27.3 26.6 - 33.0 pg Final  03/10/2023 27.0 26.0 - 34.0 pg Final   MCV  Date Value Ref Range Status  05/04/2023 84 79 - 97 fL Final   No results found for: PLTCOUNTKUC, LABPLAT, POCPLA RDW  Date Value Ref Range Status  05/04/2023 14.5 11.6 - 15.4 % Final

## 2023-09-12 ENCOUNTER — Other Ambulatory Visit: Payer: Self-pay | Admitting: Family Medicine

## 2023-09-12 DIAGNOSIS — I10 Essential (primary) hypertension: Secondary | ICD-10-CM

## 2023-09-12 DIAGNOSIS — E1122 Type 2 diabetes mellitus with diabetic chronic kidney disease: Secondary | ICD-10-CM

## 2023-09-13 ENCOUNTER — Telehealth: Payer: Self-pay

## 2023-09-13 NOTE — Telephone Encounter (Signed)
 Copied from CRM [id number]. Topic: Clinical - Prescription Issue >> Sep 08, 2023 12:37 PM Nathanel BROCKS wrote: Reason for CRM: pt called and wants to know about his medication refills and why they can't be refilled. Please give pt a call and advise.

## 2023-09-13 NOTE — Telephone Encounter (Signed)
 Requested Prescriptions  Refused Prescriptions Disp Refills   metFORMIN  (GLUCOPHAGE ) 500 MG tablet [Pharmacy Med Name: METFORMIN  HCL 500 MG TABLET] 360 tablet 1    Sig: TAKE 2 TABLETS(1000 MG) BY MOUTH TWICE DAILY WITH A MEAL     Endocrinology:  Diabetes - Biguanides Failed - 09/13/2023  3:24 PM      Failed - HBA1C is between 0 and 7.9 and within 180 days    Hemoglobin A1C  Date Value Ref Range Status  09/04/2015 6.9  Final  09/04/2015 6.9  Final   HB A1C (BAYER DCA - WAIVED)  Date Value Ref Range Status  08/02/2023 8.0 (H) 4.8 - 5.6 % Final    Comment:             Prediabetes: 5.7 - 6.4          Diabetes: >6.4          Glycemic control for adults with diabetes: <7.0          Failed - B12 Level in normal range and within 720 days    No results found for: VITAMINB12       Passed - Cr in normal range and within 360 days    Creatinine, Ser  Date Value Ref Range Status  05/04/2023 0.81 0.76 - 1.27 mg/dL Final         Passed - eGFR in normal range and within 360 days    GFR calc Af Amer  Date Value Ref Range Status  02/28/2020 114 >59 mL/min/1.73 Final    Comment:    **In accordance with recommendations from the NKF-ASN Task force,**   [facility] is in the process of updating its eGFR calculation to the   2021 CKD-EPI creatinine equation that estimates kidney function   without a race variable.    GFR, Estimated  Date Value Ref Range Status  03/10/2023 >60 >60 mL/min Final    Comment:    (NOTE) Calculated using the CKD-EPI Creatinine Equation (2021)    eGFR  Date Value Ref Range Status  05/04/2023 103 >59 mL/min/1.73 Final         Passed - Valid encounter within last 6 months    Recent Outpatient Visits           1 month ago Type 2 diabetes mellitus with stage 3 chronic kidney disease, without long-term current use of insulin , unspecified whether stage 3a or 3b CKD (HCC)   [place] [facility] [place], [name], DO   3 months ago Type 2 diabetes  mellitus with stage 3 chronic kidney disease, without long-term current use of insulin , unspecified whether stage 3a or 3b CKD (HCC)   [place] [facility] [place], [name], DO   4 months ago Type 2 diabetes mellitus with stage 3 chronic kidney disease, without long-term current use of insulin , unspecified whether stage 3a or 3b CKD [facility]   [place] [facility] [place] Duwaine SQUIBB, DO       Future Appointments             In 1 month Fernand Denyse LABOR, MD [facility]            Passed - CBC within normal limits and completed in the last 12 months    WBC  Date Value Ref Range Status  05/04/2023 10.5 3.4 - 10.8 x10E3/uL Final  03/10/2023 10.3 4.0 - 10.5 K/uL Final   RBC  Date Value Ref Range Status  05/04/2023 5.32  4.14 - 5.80 x10E6/uL Final  03/10/2023 5.22 4.22 - 5.81 MIL/uL Final   Hemoglobin  Date Value Ref Range Status  05/04/2023 14.5 13.0 - 17.7 g/dL Final   Hematocrit  Date Value Ref Range Status  05/04/2023 44.6 37.5 - 51.0 % Final   MCHC  Date Value Ref Range Status  05/04/2023 32.5 31.5 - 35.7 g/dL Final  97/79/7974 68.3 30.0 - 36.0 g/dL Final   [facility]  Date Value Ref Range Status  05/04/2023 27.3 26.6 - 33.0 pg Final  03/10/2023 27.0 26.0 - 34.0 pg Final   MCV  Date Value Ref Range Status  05/04/2023 84 79 - 97 fL Final   No results found for: PLTCOUNTKUC, LABPLAT, POCPLA RDW  Date Value Ref Range Status  05/04/2023 14.5 11.6 - 15.4 % Final          benazepril  (LOTENSIN ) 40 MG tablet [Pharmacy Med Name: BENAZEPRIL  HCL 40 MG TABLET] 90 tablet 1    Sig: TAKE 1 TABLET BY MOUTH EVERY DAY     Cardiovascular:  ACE Inhibitors Passed - 09/13/2023  3:24 PM      Passed - Cr in normal range and within 180 days    Creatinine, Ser  Date Value Ref Range Status  05/04/2023 0.81 0.76 - 1.27 mg/dL Final         Passed - K in normal range and within 180 days    Potassium  Date Value Ref Range Status  05/04/2023  4.8 3.5 - 5.2 mmol/L Final         Passed - Patient is not pregnant      Passed - Last BP in normal range    BP Readings from Last 1 Encounters:  08/04/23 120/79         Passed - Valid encounter within last 6 months    Recent Outpatient Visits           1 month ago Type 2 diabetes mellitus with stage 3 chronic kidney disease, without long-term current use of insulin , unspecified whether stage 3a or 3b CKD (HCC)   [place] [facility] [place], [name], DO   3 months ago Type 2 diabetes mellitus with stage 3 chronic kidney disease, without long-term current use of insulin , unspecified whether stage 3a or 3b CKD (HCC)   [place] [facility] [place], [name], DO   4 months ago Type 2 diabetes mellitus with stage 3 chronic kidney disease, without long-term current use of insulin , unspecified whether stage 3a or 3b CKD [facility])   [place] [facility] [place] Duwaine SQUIBB, DO       Future Appointments             In 1 month Fernand Denyse LABOR, MD [facility]

## 2023-09-14 NOTE — Telephone Encounter (Signed)
 Spoke with patient.

## 2023-09-18 ENCOUNTER — Other Ambulatory Visit: Payer: Self-pay | Admitting: Family Medicine

## 2023-09-20 NOTE — Telephone Encounter (Signed)
 Requested Prescriptions  Pending Prescriptions Disp Refills   FLUoxetine  (PROZAC ) 10 MG capsule [Pharmacy Med Name: FLUOXETINE  HCL 10 MG CAPSULE] 30 capsule 1    Sig: TAKE 1 CAPSULE BY MOUTH EVERY DAY     Psychiatry:  Antidepressants - SSRI Passed - 09/20/2023 12:24 PM      Passed - Valid encounter within last 6 months    Recent Outpatient Visits           1 month ago Type 2 diabetes mellitus with stage 3 chronic kidney disease, without long-term current use of insulin , unspecified whether stage 3a or 3b CKD (HCC)   [place] [facility] [place], [name], DO   3 months ago Type 2 diabetes mellitus with stage 3 chronic kidney disease, without long-term current use of insulin , unspecified whether stage 3a or 3b CKD (HCC)   [place] [facility] [place], [name], DO   4 months ago Type 2 diabetes mellitus with stage 3 chronic kidney disease, without long-term current use of insulin , unspecified whether stage 3a or 3b CKD [facility])   [place] [facility] [place] Duwaine SQUIBB, DO       Future Appointments             In 1 month Fernand Denyse LABOR, MD [facility]

## 2023-09-28 DIAGNOSIS — M5416 Radiculopathy, lumbar region: Secondary | ICD-10-CM | POA: Diagnosis not present

## 2023-09-28 DIAGNOSIS — M542 Cervicalgia: Secondary | ICD-10-CM | POA: Diagnosis not present

## 2023-09-28 DIAGNOSIS — M4306 Spondylolysis, lumbar region: Secondary | ICD-10-CM | POA: Diagnosis not present

## 2023-09-28 DIAGNOSIS — M9904 Segmental and somatic dysfunction of sacral region: Secondary | ICD-10-CM | POA: Diagnosis not present

## 2023-09-28 DIAGNOSIS — M955 Acquired deformity of pelvis: Secondary | ICD-10-CM | POA: Diagnosis not present

## 2023-09-28 DIAGNOSIS — M9902 Segmental and somatic dysfunction of thoracic region: Secondary | ICD-10-CM | POA: Diagnosis not present

## 2023-09-28 DIAGNOSIS — M9905 Segmental and somatic dysfunction of pelvic region: Secondary | ICD-10-CM | POA: Diagnosis not present

## 2023-09-28 DIAGNOSIS — M62830 Muscle spasm of back: Secondary | ICD-10-CM | POA: Diagnosis not present

## 2023-09-28 DIAGNOSIS — M9903 Segmental and somatic dysfunction of lumbar region: Secondary | ICD-10-CM | POA: Diagnosis not present

## 2023-09-28 DIAGNOSIS — M5418 Radiculopathy, sacral and sacrococcygeal region: Secondary | ICD-10-CM | POA: Diagnosis not present

## 2023-09-28 DIAGNOSIS — M9901 Segmental and somatic dysfunction of cervical region: Secondary | ICD-10-CM | POA: Diagnosis not present

## 2023-09-30 ENCOUNTER — Telehealth: Payer: Self-pay

## 2023-09-30 NOTE — Telephone Encounter (Signed)
 Copied from CRM #[id number]. Topic: Clinical - Medical Advice >> Sep 30, 2023 11:54 AM Treva T wrote: Reason for CRM: Patient would like to speak to nurse regarding information on flu and COVID shots he would like to discuss.  Main question he states is the safety of both shots and CDC recommendations.  Patient can be reached for return call at [phone].

## 2023-10-04 NOTE — Telephone Encounter (Signed)
 Spoke with PT. Answered all of his questions

## 2023-10-06 ENCOUNTER — Other Ambulatory Visit: Payer: Self-pay | Admitting: Family Medicine

## 2023-10-06 DIAGNOSIS — I10 Essential (primary) hypertension: Secondary | ICD-10-CM

## 2023-10-06 DIAGNOSIS — N183 Chronic kidney disease, stage 3 unspecified: Secondary | ICD-10-CM

## 2023-10-07 NOTE — Telephone Encounter (Signed)
 Requested Prescriptions  Refused Prescriptions Disp Refills   benazepril  (LOTENSIN ) 40 MG tablet [Pharmacy Med Name: BENAZEPRIL  HCL 40 MG TABLET] 90 tablet 1    Sig: TAKE 1 TABLET BY MOUTH EVERY DAY     Cardiovascular:  ACE Inhibitors Passed - 10/07/2023 11:32 AM      Passed - Cr in normal range and within 180 days    Creatinine, Ser  Date Value Ref Range Status  05/04/2023 0.81 0.76 - 1.27 mg/dL Final         Passed - K in normal range and within 180 days    Potassium  Date Value Ref Range Status  05/04/2023 4.8 3.5 - 5.2 mmol/L Final         Passed - Patient is not pregnant      Passed - Last BP in normal range    BP Readings from Last 1 Encounters:  08/04/23 120/79         Passed - Valid encounter within last 6 months    Recent Outpatient Visits           2 months ago Type 2 diabetes mellitus with stage 3 chronic kidney disease, without long-term current use of insulin , unspecified whether stage 3a or 3b CKD (HCC)   [place] [facility] [place], [name], DO   3 months ago Type 2 diabetes mellitus with stage 3 chronic kidney disease, without long-term current use of insulin , unspecified whether stage 3a or 3b CKD (HCC)   [place] [facility] [place], [name], DO   5 months ago Type 2 diabetes mellitus with stage 3 chronic kidney disease, without long-term current use of insulin , unspecified whether stage 3a or 3b CKD (HCC)   [place] [facility] [place], Duwaine SQUIBB, DO       Future Appointments             In 3 weeks Fernand Denyse LABOR, MD [facility]             metFORMIN  (GLUCOPHAGE ) 500 MG tablet [Pharmacy Med Name: METFORMIN  HCL 500 MG TABLET] 360 tablet 1    Sig: TAKE 2 TABLETS(1000 MG) BY MOUTH TWICE DAILY WITH A MEAL     Endocrinology:  Diabetes - Biguanides Failed - 10/07/2023 11:32 AM      Failed - HBA1C is between 0 and 7.9 and within 180 days    Hemoglobin A1C  Date Value Ref Range Status   09/04/2015 6.9  Final  09/04/2015 6.9  Final   HB A1C (BAYER DCA - WAIVED)  Date Value Ref Range Status  08/02/2023 8.0 (H) 4.8 - 5.6 % Final    Comment:             Prediabetes: 5.7 - 6.4          Diabetes: >6.4          Glycemic control for adults with diabetes: <7.0          Failed - B12 Level in normal range and within 720 days    No results found for: VITAMINB12       Passed - Cr in normal range and within 360 days    Creatinine, Ser  Date Value Ref Range Status  05/04/2023 0.81 0.76 - 1.27 mg/dL Final         Passed - eGFR in normal range and within 360 days    GFR calc Af Amer  Date Value Ref Range Status  02/28/2020 114 >  59 mL/min/1.73 Final    Comment:    **In accordance with recommendations from the NKF-ASN Task force,**   [facility] is in the process of updating its eGFR calculation to the   2021 CKD-EPI creatinine equation that estimates kidney function   without a race variable.    GFR, Estimated  Date Value Ref Range Status  03/10/2023 >60 >60 mL/min Final    Comment:    (NOTE) Calculated using the CKD-EPI Creatinine Equation (2021)    eGFR  Date Value Ref Range Status  05/04/2023 103 >59 mL/min/1.73 Final         Passed - Valid encounter within last 6 months    Recent Outpatient Visits           2 months ago Type 2 diabetes mellitus with stage 3 chronic kidney disease, without long-term current use of insulin , unspecified whether stage 3a or 3b CKD (HCC)   [place] [facility] [place], [name], DO   3 months ago Type 2 diabetes mellitus with stage 3 chronic kidney disease, without long-term current use of insulin , unspecified whether stage 3a or 3b CKD (HCC)   [place] [facility] [place], [name], DO   5 months ago Type 2 diabetes mellitus with stage 3 chronic kidney disease, without long-term current use of insulin , unspecified whether stage 3a or 3b CKD (HCC)   [place] [facility] [place], Duwaine SQUIBB, DO       Future Appointments             In 3 weeks Fernand Denyse LABOR, MD [facility]            Passed - CBC within normal limits and completed in the last 12 months    WBC  Date Value Ref Range Status  05/04/2023 10.5 3.4 - 10.8 x10E3/uL Final  03/10/2023 10.3 4.0 - 10.5 K/uL Final   RBC  Date Value Ref Range Status  05/04/2023 5.32 4.14 - 5.80 x10E6/uL Final  03/10/2023 5.22 4.22 - 5.81 MIL/uL Final   Hemoglobin  Date Value Ref Range Status  05/04/2023 14.5 13.0 - 17.7 g/dL Final   Hematocrit  Date Value Ref Range Status  05/04/2023 44.6 37.5 - 51.0 % Final   MCHC  Date Value Ref Range Status  05/04/2023 32.5 31.5 - 35.7 g/dL Final  97/79/7974 68.3 30.0 - 36.0 g/dL Final   [facility]  Date Value Ref Range Status  05/04/2023 27.3 26.6 - 33.0 pg Final  03/10/2023 27.0 26.0 - 34.0 pg Final   MCV  Date Value Ref Range Status  05/04/2023 84 79 - 97 fL Final   No results found for: PLTCOUNTKUC, LABPLAT, POCPLA RDW  Date Value Ref Range Status  05/04/2023 14.5 11.6 - 15.4 % Final

## 2023-10-11 NOTE — Progress Notes (Unsigned)
   10/11/2023  Patient ID: Joshua [employer], male   DOB: Aug 16, 1965, 58 y.o.   MRN: [id number]  Telephone visit to follow-up on management of type 2 diabetes   Diabetes management plan -Current medications: metformin  1000 mg 2 times daily, Ozempic  2 mg weekly -Jardiance  25mg  stopped a couple of months ago based on yeast infection, and Ozempic  increased to 2mg   -Patient will complete Ozempic  2mg  on hand (4 injections left) and then switch to Mounjaro  7.5mg  weekly in hopes of obtaining improved diabetes control -Last A1c remained steady at 7.7% -Checking home BG regularly, and states readings are still elevated after completing steroid approximately 2 weeks ago.  FBG this morning was 175. -Does not endorse any s/sx of hypoglycemia   Assessment/plan   Diabetes management plan -Continue current regimen at this time- patient will take first dose of Mounjaro  7.5mg  9/10 -Goal to titrate Mounjaro  dosing as frequently as every 4 weeks based on tolerance and efficacy -Continue to monitor FBG and record values  -Sees PCP again 10/23 -Recommended dietary modifications, including decreasing carbohydrate intake and sugary foods (sweet tea and lemon cookies at night) -Could consider retial of Jardiance , or switching patient back to Farxiga , which he was on in the past if additional BG control is needed.  Could likely get PA approved for Farxiga  if needed based on trial/failure of Jardiance    Follow-up: 6 weeks   [name], PharmD, DPLA

## 2023-10-12 ENCOUNTER — Other Ambulatory Visit: Payer: Self-pay

## 2023-10-12 DIAGNOSIS — I10 Essential (primary) hypertension: Secondary | ICD-10-CM

## 2023-10-12 DIAGNOSIS — E1122 Type 2 diabetes mellitus with diabetic chronic kidney disease: Secondary | ICD-10-CM

## 2023-10-12 MED ORDER — METFORMIN HCL 500 MG PO TABS: ORAL_TABLET | ORAL | 1 refills | Status: AC

## 2023-10-12 MED ORDER — BENAZEPRIL HCL 40 MG PO TABS: 40.0000 mg | ORAL_TABLET | Freq: Every day | ORAL | 1 refills | Status: DC

## 2023-10-13 ENCOUNTER — Encounter: Payer: Self-pay | Admitting: Family Medicine

## 2023-10-18 ENCOUNTER — Telehealth: Payer: Self-pay

## 2023-10-18 ENCOUNTER — Other Ambulatory Visit: Payer: Self-pay | Admitting: Family Medicine

## 2023-10-18 NOTE — Progress Notes (Signed)
   10/18/2023  Patient ID: Joshua Lee, male   DOB: 1965/02/05, 58 y.o.   MRN: [id number]  Returning missed call/voicemail from patient inquiring about COVID vaccine.  Patient states initially he was not able to get vaccine without a prescription; but now this is available, but was going to cost $60 at his pharmacy.  Informed patient COVID vaccine is no longer available at no cost but must be billed to insurance, which is where his copay is coming from.  Patient plans to contact PCP office to see if he can the vaccine at his next appointment.  Otherwise, I suggested he contact the outpatient pharmacy at [facility] to inquire about cost and getting the vaccine there.  Joshua Lee, PharmD, DPLA

## 2023-10-19 ENCOUNTER — Telehealth: Payer: Self-pay

## 2023-10-19 NOTE — Telephone Encounter (Signed)
 Copied from CRM #[id number]. Topic: Clinical - Medical Advice >> Oct 19, 2023  9:36 AM Nathanel BROCKS wrote: Reason for CRM: pt called to see if he can wear a bun yon patch on his foot. He bought them at the pharmacy but it had a warning label if you have diabetes contact your physician first. Please call pt and advise.

## 2023-10-19 NOTE — Telephone Encounter (Signed)
 Requested Prescriptions  Pending Prescriptions Disp Refills   tamsulosin  (FLOMAX ) 0.4 MG CAPS capsule [Pharmacy Med Name: TAMSULOSIN  HCL 0.4 MG CAPSULE] 90 capsule 0    Sig: TAKE 1 CAPSULE BY MOUTH EVERY DAY     Urology: Alpha-Adrenergic Blocker Failed - 10/19/2023  1:45 PM      Failed - PSA in normal range and within 360 days    Prostate Specific Ag, Serum  Date Value Ref Range Status  10/21/2022 0.5 0.0 - 4.0 ng/mL Final    Comment:    Roche ECLIA methodology. According to the American Urological Association, Serum PSA should decrease and remain at undetectable levels after radical prostatectomy. The AUA defines biochemical recurrence as an initial PSA value 0.2 ng/mL or greater followed by a subsequent confirmatory PSA value 0.2 ng/mL or greater. Values obtained with different assay methods or kits cannot be used interchangeably. Results cannot be interpreted as absolute evidence of the presence or absence of malignant disease.          Passed - Last BP in normal range    BP Readings from Last 1 Encounters:  08/04/23 120/79         Passed - Valid encounter within last 12 months    Recent Outpatient Visits           2 months ago Type 2 diabetes mellitus with stage 3 chronic kidney disease, without long-term current use of insulin , unspecified whether stage 3a or 3b CKD (HCC)   [place] [facility] [place], [name], DO   4 months ago Type 2 diabetes mellitus with stage 3 chronic kidney disease, without long-term current use of insulin , unspecified whether stage 3a or 3b CKD (HCC)   [place] [facility] [place], [name], DO   5 months ago Type 2 diabetes mellitus with stage 3 chronic kidney disease, without long-term current use of insulin , unspecified whether stage 3a or 3b CKD [facility])   [place] [facility] [place] Duwaine SQUIBB, DO       Future Appointments             In 2 weeks Fernand Denyse LABOR, MD [facility]

## 2023-10-19 NOTE — Telephone Encounter (Signed)
 I'm assuming this is a bunion patch- and it's fine as long as it doesn't rub and cause a blister

## 2023-10-20 ENCOUNTER — Telehealth: Payer: Self-pay

## 2023-10-20 NOTE — Telephone Encounter (Signed)
 Patient is aware and will start using.

## 2023-10-20 NOTE — Telephone Encounter (Signed)
 Copied from CRM [id number]. Topic: Clinical - Medical Advice >> Oct 20, 2023 12:11 PM Carlatta H wrote: Reason for CRM: The patient would like to know if he should take the COVID shot this year//Please to advise

## 2023-10-20 NOTE — Telephone Encounter (Signed)
 Discussed with patient and he is aware he is eligible for another vaccine based on timing of last. He does prefer Moderna and will likely complete through [facility].

## 2023-10-27 ENCOUNTER — Other Ambulatory Visit: Payer: Self-pay | Admitting: Family Medicine

## 2023-10-27 DIAGNOSIS — Z79899 Other long term (current) drug therapy: Secondary | ICD-10-CM | POA: Diagnosis not present

## 2023-10-27 DIAGNOSIS — M25561 Pain in right knee: Secondary | ICD-10-CM | POA: Diagnosis not present

## 2023-10-27 DIAGNOSIS — M7061 Trochanteric bursitis, right hip: Secondary | ICD-10-CM | POA: Diagnosis not present

## 2023-10-27 DIAGNOSIS — M5126 Other intervertebral disc displacement, lumbar region: Secondary | ICD-10-CM | POA: Diagnosis not present

## 2023-10-27 DIAGNOSIS — M791 Myalgia, unspecified site: Secondary | ICD-10-CM | POA: Diagnosis not present

## 2023-10-27 DIAGNOSIS — M51369 Other intervertebral disc degeneration, lumbar region without mention of lumbar back pain or lower extremity pain: Secondary | ICD-10-CM | POA: Diagnosis not present

## 2023-10-27 DIAGNOSIS — M48061 Spinal stenosis, lumbar region without neurogenic claudication: Secondary | ICD-10-CM | POA: Diagnosis not present

## 2023-10-31 NOTE — Telephone Encounter (Signed)
 Requested medication (s) are due for refill today: yes  Requested medication (s) are on the active medication list: yes  Last refill:  06/14/23  Future visit scheduled: yes  Notes to clinic:  Medication not assigned to a protocol, review manually.      Requested Prescriptions  Pending Prescriptions Disp Refills   nystatin  (MYCOSTATIN /NYSTOP ) powder [Pharmacy Med Name: NYSTATIN  100,000 UNIT/GM POWD] 60 g 3    Sig: APPLY TOPICALLY 3 TIMES A DAY     Off-Protocol Failed - 10/31/2023 11:29 AM      Failed - Medication not assigned to a protocol, review manually.      Passed - Valid encounter within last 12 months    Recent Outpatient Visits           3 months ago Type 2 diabetes mellitus with stage 3 chronic kidney disease, without long-term current use of insulin , unspecified whether stage 3a or 3b CKD (HCC)   [place] [facility] [place], [name], DO   4 months ago Type 2 diabetes mellitus with stage 3 chronic kidney disease, without long-term current use of insulin , unspecified whether stage 3a or 3b CKD (HCC)   [place] [facility] [place], [name], DO   6 months ago Type 2 diabetes mellitus with stage 3 chronic kidney disease, without long-term current use of insulin , unspecified whether stage 3a or 3b CKD [facility])   [place] [facility] [place] Duwaine SQUIBB, DO       Future Appointments             In 3 days Fernand Denyse LABOR, MD [facility]

## 2023-11-03 ENCOUNTER — Ambulatory Visit: Admitting: Cardiovascular Disease

## 2023-11-03 ENCOUNTER — Encounter: Payer: Self-pay | Admitting: Cardiovascular Disease

## 2023-11-03 VITALS — BP 114/62 | HR 84 | Ht 67.0 in | Wt 259.0 lb

## 2023-11-03 DIAGNOSIS — I5032 Chronic diastolic (congestive) heart failure: Secondary | ICD-10-CM

## 2023-11-03 DIAGNOSIS — E782 Mixed hyperlipidemia: Secondary | ICD-10-CM

## 2023-11-03 DIAGNOSIS — E669 Obesity, unspecified: Secondary | ICD-10-CM

## 2023-11-03 DIAGNOSIS — I340 Nonrheumatic mitral (valve) insufficiency: Secondary | ICD-10-CM

## 2023-11-03 DIAGNOSIS — E119 Type 2 diabetes mellitus without complications: Secondary | ICD-10-CM

## 2023-11-03 DIAGNOSIS — I10 Essential (primary) hypertension: Secondary | ICD-10-CM

## 2023-11-03 MED ORDER — DAPAGLIFLOZIN PROPANEDIOL 10 MG PO TABS: 10.0000 mg | ORAL_TABLET | Freq: Every day | ORAL | 3 refills | Status: AC

## 2023-11-03 NOTE — Progress Notes (Signed)
 Cardiology Office Note   Date:  11/03/2023   ID:  Joshua Lee, DOB 1965/05/16, MRN [id number]  PCP:  Vicci Duwaine SQUIBB, DO  Cardiologist:  Denyse Bathe, MD      History of Present Illness: Joshua Lee is a 58 y.o. male who presents for  Chief Complaint  Patient presents with   Follow-up    3 months follow up    Has SOB, but off jaurdiance as had a rash. Could not take ozempic  and now on mounjaro .      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     amLODipine  (NORVASC ) 2.5 MG tablet Take 1 tablet (2.5 mg total) by mouth daily. 90 tablet 1   aspirin  81 MG tablet Take 81 mg by mouth daily.     atorvastatin  (LIPITOR) 80 MG tablet Take 1 tablet (80 mg total) by mouth daily. 90 tablet 1   benazepril  (LOTENSIN ) 40 MG tablet Take 1 tablet (40 mg total) by mouth daily. 90 tablet 1   Blood Glucose Monitoring Suppl (ACCU-CHEK GUIDE ME) w/Device KIT Use to check blood glucose two times daily 1 kit 0   Coenzyme Q-10 100 MG capsule Take 100 mg by mouth daily.     cyclobenzaprine  (FLEXERIL ) 10 MG tablet Take 1 tablet (10 mg total) by mouth at bedtime. 30 tablet 1   dapagliflozin  propanediol (FARXIGA )  10 MG TABS tablet Take 1 tablet (10 mg total) by mouth daily before breakfast. 30 tablet 3   diclofenac  Sodium (VOLTAREN ) 1 % GEL Apply 4 g topically 4 (four) times daily. 350 g 12   FLUoxetine  (PROZAC ) 10 MG capsule TAKE 1 CAPSULE BY MOUTH EVERY DAY 30 capsule 1   fluticasone  (FLONASE ) 50 MCG/ACT nasal spray Place 1 spray into both nostrils 2 (two) times daily. 48 mL 4   gabapentin (NEURONTIN) 300 MG capsule Take 300 mg by mouth 3 (three) times daily as needed.     Garlic 1000 MG CAPS Take 1,000 mg by mouth daily.     glucose blood (ACCU-CHEK GUIDE TEST) test strip 1 each by Other route 2 (two) times daily. 200 each 3   icosapent  Ethyl (VASCEPA ) 1 g capsule Take 2 capsules (2 g total) by mouth 2 (two) times daily. 360 capsule 1   metFORMIN  (GLUCOPHAGE ) 500 MG tablet TAKE 2 TABLETS(1000 MG) BY MOUTH TWICE DAILY WITH A MEAL 360 tablet 1   metoprolol  tartrate (LOPRESSOR ) 100 MG tablet Take 1 tablet (100 mg total) by mouth 2 (two) times daily. 180 tablet 1   nystatin  (MYCOSTATIN /NYSTOP ) powder APPLY TOPICALLY 3 TIMES A DAY 60 g 3  Potassium Citrate 15 MEQ (1620 MG) TBCR Take 15 mEq by mouth 2 (two) times daily. 2 tab QAM, 1 tab QPM  1   saline (AYR) GEL Place 1 Application into both nostrils every 6 (six) hours as needed. 14.1 g 6   tamsulosin  (FLOMAX ) 0.4 MG CAPS capsule TAKE 1 CAPSULE BY MOUTH EVERY DAY 90 capsule 0   tirzepatide  (MOUNJARO ) 7.5 MG/0.5ML Pen Inject 7.5 mg into the skin once a week. 2 mL 1   traMADol  (ULTRAM ) 50 MG tablet Take 50 mg by mouth every 12 (twelve) hours as needed.     No current facility-administered medications for this visit.    Allergies:   Penicillins    Social History:   reports that he has never smoked. His smokeless tobacco use includes snuff. He reports that he does not currently use alcohol. He reports that he does not use drugs.   Family History:  family history includes Diabetes in his maternal uncle.    ROS:     Review of Systems  Constitutional:  Negative.   HENT: Negative.    Eyes: Negative.   Respiratory: Negative.    Gastrointestinal: Negative.   Genitourinary: Negative.   Musculoskeletal: Negative.   Skin: Negative.   Neurological: Negative.   Endo/Heme/Allergies: Negative.   Psychiatric/Behavioral: Negative.    All other systems reviewed and are negative.     All other systems are reviewed and negative.    PHYSICAL EXAM: VS:  BP 114/62   Pulse 84   Ht 5' 7 (1.702 m)   Wt 259 lb (117.5 kg)   SpO2 97%   BMI 40.57 kg/m  , BMI Body mass index is 40.57 kg/m. Last weight:  Wt Readings from Last 3 Encounters:  11/03/23 259 lb (117.5 kg)  08/04/23 251 lb 6.4 oz (114 kg)  08/02/23 251 lb 3.2 oz (113.9 kg)     Physical Exam Vitals reviewed.  Constitutional:      Appearance: Normal appearance. He is normal weight.  HENT:     Head: Normocephalic.     Nose: Nose normal.     Mouth/Throat:     Mouth: Mucous membranes are moist.  Eyes:     Pupils: Pupils are equal, round, and reactive to light.  Cardiovascular:     Rate and Rhythm: Normal rate and regular rhythm.     Pulses: Normal pulses.     Heart sounds: Normal heart sounds.  Pulmonary:     Effort: Pulmonary effort is normal.  Abdominal:     General: Abdomen is flat. Bowel sounds are normal.  Musculoskeletal:        General: Normal range of motion.     Cervical back: Normal range of motion.  Skin:    General: Skin is warm.  Neurological:     General: No focal deficit present.     Mental Status: He is alert.  Psychiatric:        Mood and Affect: Mood normal.       EKG:   Recent Labs: 05/04/2023: ALT 26; BUN 13; Creatinine, Ser 0.81; Hemoglobin 14.5; Platelets 279; Potassium 4.8; Sodium 135    Lipid Panel    Component Value Date/Time   CHOL 94 (L) 05/04/2023 1053   CHOL 108 08/29/2018 1045   TRIG 113 05/04/2023 1053   TRIG 221 (H) 08/29/2018 1045   HDL 27 (L) 05/04/2023 1053   CHOLHDL 6.5 (H) 02/09/2017 1002   VLDL 44 (H) 08/29/2018  1045   LDLCALC 46 05/04/2023 1053  Other studies Reviewed: Additional studies/ records that were reviewed today include:  Review of the above records demonstrates:       No data to display            ASSESSMENT AND PLAN:    ICD-10-CM   1. Mixed hyperlipidemia  E78.2 dapagliflozin  propanediol (FARXIGA ) 10 MG TABS tablet    2. Primary hypertension  I10 dapagliflozin  propanediol (FARXIGA ) 10 MG TABS tablet    3. Nonrheumatic mitral valve regurgitation  I34.0 dapagliflozin  propanediol (FARXIGA ) 10 MG TABS tablet    4. Morbid obesity (HCC)  E66.01 dapagliflozin  propanediol (FARXIGA ) 10 MG TABS tablet    5. Obesity, diabetes, and hypertension syndrome (HCC)  E66.9 dapagliflozin  propanediol (FARXIGA ) 10 MG TABS tablet   I10    E11.9     6. Chronic diastolic congestive heart failure (HCC)  I50.32 dapagliflozin  propanediol (FARXIGA ) 10 MG TABS tablet   Has grade 2 diastolic dysfunction, on echo, and had rash with jaurdiance. Advise farxiga .       Problem List Items Addressed This Visit       Cardiovascular and Mediastinum   Hypertension   Relevant Medications   dapagliflozin  propanediol (FARXIGA ) 10 MG TABS tablet   Obesity, diabetes, and hypertension syndrome (HCC)   Relevant Medications   dapagliflozin  propanediol (FARXIGA ) 10 MG TABS tablet     Other   Hyperlipidemia - Primary   Relevant Medications   dapagliflozin  propanediol (FARXIGA ) 10 MG TABS tablet   Morbid obesity (HCC)   Relevant Medications   dapagliflozin  propanediol (FARXIGA ) 10 MG TABS tablet   Other Visit Diagnoses       Nonrheumatic mitral valve regurgitation       Relevant Medications   dapagliflozin  propanediol (FARXIGA ) 10 MG TABS tablet     Chronic diastolic congestive heart failure (HCC)       Has grade 2 diastolic dysfunction, on echo, and had rash with jaurdiance. Advise farxiga .   Relevant Medications   dapagliflozin  propanediol (FARXIGA ) 10 MG TABS tablet           Disposition:   Return in about 2 months (around 01/03/2024).    Total time spent: 35 minutes  Signed,  Denyse Bathe, MD  11/03/2023 11:09 AM    [facility]

## 2023-11-10 ENCOUNTER — Encounter: Admitting: Family Medicine

## 2023-11-14 ENCOUNTER — Ambulatory Visit (INDEPENDENT_AMBULATORY_CARE_PROVIDER_SITE_OTHER): Admitting: Family Medicine

## 2023-11-14 ENCOUNTER — Encounter: Payer: Self-pay | Admitting: Family Medicine

## 2023-11-14 VITALS — BP 130/76 | HR 82 | Temp 97.7°F | Ht 67.0 in | Wt 255.0 lb

## 2023-11-14 DIAGNOSIS — Z23 Encounter for immunization: Secondary | ICD-10-CM | POA: Diagnosis not present

## 2023-11-14 DIAGNOSIS — E1122 Type 2 diabetes mellitus with diabetic chronic kidney disease: Secondary | ICD-10-CM

## 2023-11-14 DIAGNOSIS — N183 Chronic kidney disease, stage 3 unspecified: Secondary | ICD-10-CM | POA: Diagnosis not present

## 2023-11-14 DIAGNOSIS — Z0000 Encounter for general adult medical examination without abnormal findings: Secondary | ICD-10-CM | POA: Diagnosis not present

## 2023-11-14 DIAGNOSIS — Z789 Other specified health status: Secondary | ICD-10-CM

## 2023-11-14 LAB — MICROALBUMIN, URINE WAIVED
Creatinine, Urine Waived: 100 mg/dL (ref 10–300)
Microalb, Ur Waived: 30 mg/L — ABNORMAL HIGH (ref 0–19)

## 2023-11-14 LAB — BAYER DCA HB A1C WAIVED: HB A1C (BAYER DCA - WAIVED): 8.7 % — ABNORMAL HIGH (ref 4.8–5.6)

## 2023-11-14 MED ORDER — ATORVASTATIN CALCIUM 80 MG PO TABS: 80.0000 mg | ORAL_TABLET | Freq: Every day | ORAL | 1 refills | Status: AC

## 2023-11-14 MED ORDER — METOPROLOL TARTRATE 100 MG PO TABS
100.0000 mg | ORAL_TABLET | Freq: Two times a day (BID) | ORAL | 1 refills | Status: AC
Start: 2023-11-14 — End: ?

## 2023-11-14 MED ORDER — FLUOXETINE HCL 10 MG PO CAPS: 10.0000 mg | ORAL_CAPSULE | Freq: Every day | ORAL | 1 refills | Status: AC

## 2023-11-14 MED ORDER — TIRZEPATIDE 12.5 MG/0.5ML ~~LOC~~ SOAJ: 12.5000 mg | SUBCUTANEOUS | 0 refills | Status: DC

## 2023-11-14 MED ORDER — MOUNJARO 10 MG/0.5ML ~~LOC~~ SOAJ: 10.0000 mg | SUBCUTANEOUS | 0 refills | Status: DC

## 2023-11-14 MED ORDER — ICOSAPENT ETHYL 1 G PO CAPS: 2.0000 g | ORAL_CAPSULE | Freq: Two times a day (BID) | ORAL | 1 refills | Status: AC

## 2023-11-14 MED ORDER — AMLODIPINE BESYLATE 2.5 MG PO TABS: 2.5000 mg | ORAL_TABLET | Freq: Every day | ORAL | 1 refills | Status: DC

## 2023-11-14 NOTE — Progress Notes (Signed)
 BP 130/76   Pulse 82   Temp 97.7 F (36.5 C) (Oral)   Ht 5' 7 (1.702 m)   Wt 255 lb (115.7 kg)   SpO2 96%   BMI 39.94 kg/m    Subjective:    Patient ID: Joshua Lee [employer], male    DOB: 17-Jan-1966, 58 y.o.   MRN: [id number]  HPI: Joshua Lee is a 58 y.o. male presenting on 11/14/2023 for comprehensive medical examination. Current medical complaints include:  DIABETES Hypoglycemic episodes:no Polydipsia/polyuria: no Visual disturbance: no Chest pain: no Paresthesias: yes Glucose Monitoring: no  Accucheck frequency: occasionally Taking Insulin ?: no Blood Pressure Monitoring: rarely Retinal Examination: Up to Date Foot Exam: Up to Date Diabetic Education: Completed Pneumovax: Up to Date Influenza: Up to Date Aspirin : yes  HYPERTENSION / HYPERLIPIDEMIA Satisfied with current treatment? yes Duration of hypertension: chronic BP monitoring frequency: not checking BP medication side effects: no Past BP meds: metoprolol , amlodipine , benazepril  Duration of hyperlipidemia: chronic Cholesterol medication side effects: no Cholesterol supplements: garlic Past cholesterol medications: atorvastatin , vascepa  Medication compliance: excellent compliance Aspirin : yes Recent stressors: yes Recurrent headaches: no Visual changes: no Palpitations: no Dyspnea: no Chest pain: no Lower extremity edema: no Dizzy/lightheaded: no  Interim Problems from his last visit: no  Depression Screen done today and results listed below:     09/24/2021    4:35 PM 08/28/2020   10:19 AM 08/27/2020    3:54 PM 06/10/2020    1:12 PM 05/15/2019   10:18 AM  Depression screen PHQ 2/9  Decreased Interest 0 0 0 0 0  Down, Depressed, Hopeless 0 0 0 0 0  PHQ - 2 Score 0 0 0 0 0    Past Medical History:  Past Medical History:  Diagnosis Date   Diabetes mellitus without complication (HCC)    GERD (gastroesophageal reflux disease)    Hyperlipidemia    Hypertension    Kidney stones    Plantar  fascial fibromatosis     Surgical History:  Past Surgical History:  Procedure Laterality Date   CHOLECYSTECTOMY  2006   GALLBLADDER SURGERY     KIDNEY STONE SURGERY     KNEE ARTHROSCOPY     TOTAL HIP ARTHROPLASTY Right 07/08/2015   Procedure: TOTAL HIP ARTHROPLASTY ANTERIOR APPROACH;  Surgeon: Ozell Flake, MD;  Location: [facility] ORS;  Service: Orthopedics;  Laterality: Right;    Medications:  Current Outpatient Medications on File Prior to Visit  Medication Sig   Accu-Chek Softclix Lancets lancets Use to check blood glucose two times daily   acetaminophen  (TYLENOL ) 650 MG CR tablet Take 650-1,950 mg by mouth every 8 (eight) hours as needed for pain.   aspirin  81 MG tablet Take 81 mg by mouth daily.   benazepril  (LOTENSIN ) 40 MG tablet Take 1 tablet (40 mg total) by mouth daily.   Blood Glucose Monitoring Suppl (ACCU-CHEK GUIDE ME) w/Device KIT Use to check blood glucose two times daily   Coenzyme Q-10 100 MG capsule Take 100 mg by mouth daily.   cyclobenzaprine  (FLEXERIL ) 10 MG tablet Take 1 tablet (10 mg total) by mouth at bedtime.   dapagliflozin  propanediol (FARXIGA ) 10 MG TABS tablet Take 1 tablet (10 mg total) by mouth daily before breakfast.   diclofenac  Sodium (VOLTAREN ) 1 % GEL Apply 4 g topically 4 (four) times daily.   fluticasone  (FLONASE ) 50 MCG/ACT nasal spray Place 1 spray into both nostrils 2 (two) times daily.   gabapentin (NEURONTIN) 300 MG capsule Take 300 mg by mouth 3 (three)  times daily as needed.   Garlic 1000 MG CAPS Take 1,000 mg by mouth daily.   glucose blood (ACCU-CHEK GUIDE TEST) test strip 1 each by Other route 2 (two) times daily.   metFORMIN  (GLUCOPHAGE ) 500 MG tablet TAKE 2 TABLETS(1000 MG) BY MOUTH TWICE DAILY WITH A MEAL   nystatin  (MYCOSTATIN /NYSTOP ) powder APPLY TOPICALLY 3 TIMES A DAY   Potassium Citrate 15 MEQ (1620 MG) TBCR Take 15 mEq by mouth 2 (two) times daily. 2 tab QAM, 1 tab QPM   saline (AYR) GEL Place 1 Application into both nostrils every  6 (six) hours as needed.   tamsulosin  (FLOMAX ) 0.4 MG CAPS capsule TAKE 1 CAPSULE BY MOUTH EVERY DAY   No current facility-administered medications on file prior to visit.    Allergies:  Allergies  Allergen Reactions   Penicillins Rash    Has patient had a PCN reaction causing immediate rash, facial/tongue/throat swelling, SOB or lightheadedness with hypotension: unknown Has patient had a PCN reaction causing severe rash involving mucus membranes or skin necrosis: unknown Has patient had a PCN reaction that required hospitalization: unknown Has patient had a PCN reaction occurring within the last 10 years: no If all of the above answers are NO, then may proceed with Cephalosporin use.     Social History:  Social History   Socioeconomic History   Marital status: Single    Spouse name: Not on file   Number of children: Not on file   Years of education: Not on file   Highest education level: Not on file  Occupational History   Not on file  Tobacco Use   Smoking status: Never   Smokeless tobacco: Current    Types: Snuff  Vaping Use   Vaping status: Never Used  Substance and Sexual Activity   Alcohol use: Not Currently   Drug use: No   Sexual activity: Not Currently  Other Topics Concern   Not on file  Social History Narrative   Not on file   Social Drivers of Health   Financial Resource Strain: Low Risk  (08/02/2023)   Overall Financial Resource Strain (CARDIA)    Difficulty of Paying Living Expenses: Not hard at all  Food Insecurity: No Food Insecurity (08/02/2023)   Hunger Vital Sign    Worried About Running Out of Food in the Last Year: Never true    Ran Out of Food in the Last Year: Never true  Transportation Needs: No Transportation Needs (08/02/2023)   PRAPARE - Administrator, Civil Service (Medical): No    Lack of Transportation (Non-Medical): No  Physical Activity: Inactive (08/02/2023)   Exercise Vital Sign    Days of Exercise per Week: 0 days     Minutes of Exercise per Session: 0 min  Stress: No Stress Concern Present (08/02/2023)   [employer] of Occupational Health - Occupational Stress Questionnaire    Feeling of Stress: Not at all  Social Connections: Socially Isolated (08/02/2023)   Social Connection and Isolation Panel    Frequency of Communication with Friends and Family: Never    Frequency of Social Gatherings with Friends and Family: Never    Attends Religious Services: 1 to 4 times per year    Active Member of [employer] or Organizations: No    Attends Banker Meetings: Never    Marital Status: Never married  Intimate Partner Violence: Not At Risk (08/02/2023)   Humiliation, Afraid, Rape, and Kick questionnaire    Fear of Current or Ex-Partner:  No    Emotionally Abused: No    Physically Abused: No    Sexually Abused: No   Social History   Tobacco Use  Smoking Status Never  Smokeless Tobacco Current   Types: Snuff   Social History   Substance and Sexual Activity  Alcohol Use Not Currently    Family History:  Family History  Problem Relation Age of Onset   Diabetes Maternal Uncle     Past medical history, surgical history, medications, allergies, family history and social history reviewed with patient today and changes made to appropriate areas of the chart.   Review of Systems  Constitutional: Negative.   HENT: Negative.    Eyes: Negative.   Respiratory:  Positive for cough. Negative for hemoptysis, sputum production, shortness of breath and wheezing.   Gastrointestinal:  Positive for heartburn. Negative for abdominal pain, blood in stool, constipation, diarrhea, melena, nausea and vomiting.  Genitourinary: Negative.   Musculoskeletal:  Positive for myalgias. Negative for back pain, falls, joint pain and neck pain.  Skin: Negative.   Neurological: Negative.   Endo/Heme/Allergies:  Positive for polydipsia. Negative for environmental allergies. Bruises/bleeds easily.   Psychiatric/Behavioral:  Negative for depression, hallucinations, memory loss, substance abuse and suicidal ideas. The patient is nervous/anxious. The patient does not have insomnia.    All other ROS negative except what is listed above and in the HPI.      Objective:    BP 130/76   Pulse 82   Temp 97.7 F (36.5 C) (Oral)   Ht 5' 7 (1.702 m)   Wt 255 lb (115.7 kg)   SpO2 96%   BMI 39.94 kg/m   Wt Readings from Last 3 Encounters:  11/14/23 255 lb (115.7 kg)  11/03/23 259 lb (117.5 kg)  08/04/23 251 lb 6.4 oz (114 kg)    Physical Exam Vitals and nursing note reviewed.  Constitutional:      General: He is not in acute distress.    Appearance: Normal appearance. He is obese. He is not ill-appearing, toxic-appearing or diaphoretic.  HENT:     Head: Normocephalic and atraumatic.     Right Ear: Tympanic membrane, ear canal and external ear normal. There is no impacted cerumen.     Left Ear: Tympanic membrane, ear canal and external ear normal. There is no impacted cerumen.     Nose: Nose normal. No congestion or rhinorrhea.     Mouth/Throat:     Mouth: Mucous membranes are moist.     Pharynx: Oropharynx is clear. No oropharyngeal exudate or posterior oropharyngeal erythema.  Eyes:     General: No scleral icterus.       Right eye: No discharge.        Left eye: No discharge.     Extraocular Movements: Extraocular movements intact.     Conjunctiva/sclera: Conjunctivae normal.     Pupils: Pupils are equal, round, and reactive to light.  Neck:     Vascular: No carotid bruit.  Cardiovascular:     Rate and Rhythm: Normal rate and regular rhythm.     Pulses: Normal pulses.     Heart sounds: No murmur heard.    No friction rub. No gallop.  Pulmonary:     Effort: Pulmonary effort is normal. No respiratory distress.     Breath sounds: Normal breath sounds. No stridor. No wheezing, rhonchi or rales.  Chest:     Chest wall: No tenderness.  Abdominal:     General: Abdomen is  flat. Bowel sounds  are normal. There is no distension.     Palpations: Abdomen is soft. There is no mass.     Tenderness: There is no abdominal tenderness. There is no right CVA tenderness, left CVA tenderness, guarding or rebound.     Hernia: No hernia is present.  Genitourinary:    Comments: Genital exam deferred with shared decision making Musculoskeletal:        General: No swelling, tenderness, deformity or signs of injury.     Cervical back: Normal range of motion and neck supple. No rigidity. No muscular tenderness.     Right lower leg: No edema.     Left lower leg: No edema.  Lymphadenopathy:     Cervical: No cervical adenopathy.  Skin:    General: Skin is warm and dry.     Capillary Refill: Capillary refill takes less than 2 seconds.     Coloration: Skin is not jaundiced or pale.     Findings: No bruising, erythema, lesion or rash.  Neurological:     General: No focal deficit present.     Mental Status: He is alert and oriented to person, place, and time.     Cranial Nerves: No cranial nerve deficit.     Sensory: No sensory deficit.     Motor: No weakness.     Coordination: Coordination normal.     Gait: Gait normal.     Deep Tendon Reflexes: Reflexes normal.  Psychiatric:        Mood and Affect: Mood normal.        Behavior: Behavior normal.        Thought Content: Thought content normal.        Judgment: Judgment normal.     Results for orders placed or performed in visit on 08/02/23  Bayer DCA Hb A1c Waived   Collection Time: 08/02/23  9:11 AM  Result Value Ref Range   HB A1C (BAYER DCA - WAIVED) 8.0 (H) 4.8 - 5.6 %  Measles/Mumps/Rubella Immunity   Collection Time: 08/02/23  9:42 AM  Result Value Ref Range   Rubella Antibodies, IGG 12.90 Immune >0.99 index   RUBEOLA AB, IGG >300.0 Immune >16.4 AU/mL   MUMPS ABS, IGG 241.0 Immune >10.9 AU/mL  Hepatitis B surface antibody,quantitative   Collection Time: 08/02/23  9:42 AM  Result Value Ref Range   Hepatitis  B Surf Ab Quant <3.5 (L) Immunity>10 mIU/mL      Assessment & Plan:   Problem List Items Addressed This Visit       Endocrine   Type 2 diabetes mellitus with stage 3 chronic kidney disease, without long-term current use of insulin  (HCC)   Not doing well with A1c up to 8.7 from 8. Just started on farxiga  by cardiology. Will continue titration of his mounjaro  to 10 and then 12.5. Follow up 2 months. Call with any concerns.       Relevant Medications   tirzepatide  (MOUNJARO ) 10 MG/0.5ML Pen   tirzepatide  (MOUNJARO ) 12.5 MG/0.5ML Pen (Start on 12/12/2023)   atorvastatin  (LIPITOR) 80 MG tablet   icosapent  Ethyl (VASCEPA ) 1 g capsule   metoprolol  tartrate (LOPRESSOR ) 100 MG tablet   Other Relevant Orders   Microalbumin, Urine Waived   Bayer DCA Hb A1c Waived   Other Visit Diagnoses       Routine general medical examination at a health care facility    -  Primary   Vaccines updated. Screening labs checked today. Colonoscopy up to date. Continue diet and exercise. Call with  any concerns.   Relevant Orders   Comprehensive metabolic panel with GFR   CBC with Differential/Platelet   Lipid Panel w/o Chol/HDL Ratio   PSA   TSH   Microalbumin, Urine Waived   Bayer DCA Hb A1c Waived     Not immune to hepatitis B virus       Hep B #2 given today.   Relevant Orders   Heplisav-B  (HepB-CPG) Vaccine        Discussed aspirin  prophylaxis for myocardial infarction prevention and decision was made to continue ASA  LABORATORY TESTING:  Health maintenance labs ordered today as discussed above.   The natural history of prostate cancer and ongoing controversy regarding screening and potential treatment outcomes of prostate cancer has been discussed with the patient. The meaning of a false positive PSA and a false negative PSA has been discussed. He indicates understanding of the limitations of this screening test and wishes to proceed with screening PSA testing.   IMMUNIZATIONS:   - Tdap:  Tetanus vaccination status reviewed: last tetanus booster within 10 years. - Influenza: Up to date - Pneumovax: Up to date - Prevnar: Up to date - COVID: Up to date - HPV: Not applicable - Shingrix  vaccine: Up to date  SCREENING: - Colonoscopy: Up to date  Discussed with patient purpose of the colonoscopy is to detect colon cancer at curable precancerous or early stages   PATIENT COUNSELING:    Sexuality: Discussed sexually transmitted diseases, partner selection, use of condoms, avoidance of unintended pregnancy  and contraceptive alternatives.   Advised to avoid cigarette smoking.  I discussed with the patient that most people either abstain from alcohol or drink within safe limits (<=14/week and <=4 drinks/occasion for males, <=7/weeks and <= 3 drinks/occasion for females) and that the risk for alcohol disorders and other health effects rises proportionally with the number of drinks per week and how often a drinker exceeds daily limits.  Discussed cessation/primary prevention of drug use and availability of treatment for abuse.   Diet: Encouraged to adjust caloric intake to maintain  or achieve ideal body weight, to reduce intake of dietary saturated fat and total fat, to limit sodium intake by avoiding high sodium foods and not adding table salt, and to maintain adequate dietary potassium and calcium  preferably from fresh fruits, vegetables, and low-fat dairy products.    stressed the importance of regular exercise  Injury prevention: Discussed safety belts, safety helmets, smoke detector, smoking near bedding or upholstery.   Dental health: Discussed importance of regular tooth brushing, flossing, and dental visits.   Follow up plan: NEXT PREVENTATIVE PHYSICAL DUE IN 1 YEAR. Return before the end of the year.

## 2023-11-14 NOTE — Assessment & Plan Note (Signed)
 Not doing well with A1c up to 8.7 from 8. Just started on farxiga  by cardiology. Will continue titration of his mounjaro  to 10 and then 12.5. Follow up 2 months. Call with any concerns.

## 2023-11-15 ENCOUNTER — Ambulatory Visit: Payer: Self-pay | Admitting: Family Medicine

## 2023-11-15 DIAGNOSIS — N200 Calculus of kidney: Secondary | ICD-10-CM | POA: Diagnosis not present

## 2023-11-15 LAB — PSA: Prostate Specific Ag, Serum: 0.4 ng/mL (ref 0.0–4.0)

## 2023-11-15 LAB — COMPREHENSIVE METABOLIC PANEL WITH GFR
ALT: 36 IU/L (ref 0–44)
AST: 28 IU/L (ref 0–40)
Albumin: 4 g/dL (ref 3.8–4.9)
Alkaline Phosphatase: 49 IU/L (ref 47–123)
BUN/Creatinine Ratio: 22 — ABNORMAL HIGH (ref 9–20)
BUN: 16 mg/dL (ref 6–24)
Bilirubin Total: 0.5 mg/dL (ref 0.0–1.2)
CO2: 21 mmol/L (ref 20–29)
Calcium: 9.1 mg/dL (ref 8.7–10.2)
Chloride: 100 mmol/L (ref 96–106)
Creatinine, Ser: 0.74 mg/dL — ABNORMAL LOW (ref 0.76–1.27)
Globulin, Total: 2.3 g/dL (ref 1.5–4.5)
Glucose: 278 mg/dL — ABNORMAL HIGH (ref 70–99)
Potassium: 4.4 mmol/L (ref 3.5–5.2)
Sodium: 138 mmol/L (ref 134–144)
Total Protein: 6.3 g/dL (ref 6.0–8.5)
eGFR: 105 mL/min/1.73 (ref >59)

## 2023-11-15 LAB — CBC WITH DIFFERENTIAL/PLATELET
Basophils Absolute: 0 x10E3/uL (ref 0.0–0.2)
Basos: 0 %
EOS (ABSOLUTE): 0.1 x10E3/uL (ref 0.0–0.4)
Eos: 1 %
Hematocrit: 40.8 % (ref 37.5–51.0)
Hemoglobin: 12.5 g/dL — ABNORMAL LOW (ref 13.0–17.7)
Immature Grans (Abs): 0 x10E3/uL (ref 0.0–0.1)
Immature Granulocytes: 0 %
Lymphocytes Absolute: 1.4 x10E3/uL (ref 0.7–3.1)
Lymphs: 18 %
MCH: 27.8 pg (ref 26.6–33.0)
MCHC: 30.6 g/dL — ABNORMAL LOW (ref 31.5–35.7)
MCV: 91 fL (ref 79–97)
Monocytes Absolute: 0.5 x10E3/uL (ref 0.1–0.9)
Monocytes: 7 %
Neutrophils Absolute: 5.5 x10E3/uL (ref 1.4–7.0)
Neutrophils: 74 %
Platelets: 236 x10E3/uL (ref 150–450)
RBC: 4.49 x10E6/uL (ref 4.14–5.80)
RDW: 14.6 % (ref 11.6–15.4)
WBC: 7.5 x10E3/uL (ref 3.4–10.8)

## 2023-11-15 LAB — TSH: TSH: 1.87 u[IU]/mL (ref 0.450–4.500)

## 2023-11-15 LAB — LIPID PANEL W/O CHOL/HDL RATIO
Cholesterol, Total: 107 mg/dL (ref 100–199)
HDL: 25 mg/dL — ABNORMAL LOW (ref >39)
LDL Chol Calc (NIH): 41 mg/dL (ref 0–99)
Triglycerides: 264 mg/dL — ABNORMAL HIGH (ref 0–149)
VLDL Cholesterol Cal: 41 mg/dL — ABNORMAL HIGH (ref 5–40)

## 2023-11-21 DIAGNOSIS — N183 Chronic kidney disease, stage 3 unspecified: Secondary | ICD-10-CM | POA: Diagnosis not present

## 2023-11-21 DIAGNOSIS — I10 Essential (primary) hypertension: Secondary | ICD-10-CM | POA: Diagnosis not present

## 2023-11-21 DIAGNOSIS — E1122 Type 2 diabetes mellitus with diabetic chronic kidney disease: Secondary | ICD-10-CM | POA: Diagnosis not present

## 2023-11-21 DIAGNOSIS — R600 Localized edema: Secondary | ICD-10-CM | POA: Diagnosis not present

## 2023-11-23 DIAGNOSIS — M9904 Segmental and somatic dysfunction of sacral region: Secondary | ICD-10-CM | POA: Diagnosis not present

## 2023-11-23 DIAGNOSIS — M9903 Segmental and somatic dysfunction of lumbar region: Secondary | ICD-10-CM | POA: Diagnosis not present

## 2023-11-23 DIAGNOSIS — M9905 Segmental and somatic dysfunction of pelvic region: Secondary | ICD-10-CM | POA: Diagnosis not present

## 2023-11-23 DIAGNOSIS — M9902 Segmental and somatic dysfunction of thoracic region: Secondary | ICD-10-CM | POA: Diagnosis not present

## 2023-11-23 DIAGNOSIS — M5416 Radiculopathy, lumbar region: Secondary | ICD-10-CM | POA: Diagnosis not present

## 2023-11-23 DIAGNOSIS — M4306 Spondylolysis, lumbar region: Secondary | ICD-10-CM | POA: Diagnosis not present

## 2023-11-23 DIAGNOSIS — M955 Acquired deformity of pelvis: Secondary | ICD-10-CM | POA: Diagnosis not present

## 2023-11-23 DIAGNOSIS — M5418 Radiculopathy, sacral and sacrococcygeal region: Secondary | ICD-10-CM | POA: Diagnosis not present

## 2023-11-23 DIAGNOSIS — M542 Cervicalgia: Secondary | ICD-10-CM | POA: Diagnosis not present

## 2023-11-23 DIAGNOSIS — M62830 Muscle spasm of back: Secondary | ICD-10-CM | POA: Diagnosis not present

## 2023-11-23 DIAGNOSIS — M9901 Segmental and somatic dysfunction of cervical region: Secondary | ICD-10-CM | POA: Diagnosis not present

## 2023-11-24 ENCOUNTER — Ambulatory Visit: Admitting: Pediatrics

## 2023-11-28 DIAGNOSIS — Z03818 Encounter for observation for suspected exposure to other biological agents ruled out: Secondary | ICD-10-CM | POA: Diagnosis not present

## 2023-11-28 DIAGNOSIS — J069 Acute upper respiratory infection, unspecified: Secondary | ICD-10-CM | POA: Diagnosis not present

## 2023-11-28 DIAGNOSIS — J028 Acute pharyngitis due to other specified organisms: Secondary | ICD-10-CM | POA: Diagnosis not present

## 2023-11-29 ENCOUNTER — Telehealth: Payer: Self-pay | Admitting: Family Medicine

## 2023-12-01 ENCOUNTER — Ambulatory Visit: Admitting: Podiatry

## 2023-12-01 ENCOUNTER — Encounter: Payer: Self-pay | Admitting: Podiatry

## 2023-12-01 DIAGNOSIS — B351 Tinea unguium: Secondary | ICD-10-CM

## 2023-12-01 DIAGNOSIS — M79675 Pain in left toe(s): Secondary | ICD-10-CM | POA: Diagnosis not present

## 2023-12-01 DIAGNOSIS — E1142 Type 2 diabetes mellitus with diabetic polyneuropathy: Secondary | ICD-10-CM

## 2023-12-01 DIAGNOSIS — M79674 Pain in right toe(s): Secondary | ICD-10-CM | POA: Diagnosis not present

## 2023-12-01 NOTE — Progress Notes (Signed)
  Subjective:  Patient ID: Joshua Lee, male    DOB: 18-Aug-1965,  MRN: [id number]  Joshua Lee presents to clinic today for at risk foot care with history of diabetic neuropathy and painful thick toenails that are difficult to trim. Pain interferes with ambulation. Aggravating factors include wearing enclosed shoe gear. Pain is relieved with periodic professional debridement. Patient states he is now wearing compression hose. His feet were really swollen. His PCP discontinued amlodipine . Chief Complaint  Patient presents with   Joshua Lee    Rm2 Diabetic foot care/ A1c 8.7/ Joshua Lee last visit October 2025   New problem(s): None.   PCP is Joshua Lee, Joshua Lee, Joshua Lee.  Allergies  Allergen Reactions   Penicillins Rash    Has patient had a PCN reaction causing immediate rash, facial/tongue/throat swelling, SOB or lightheadedness with hypotension: unknown Has patient had a PCN reaction causing severe rash involving mucus membranes or skin necrosis: unknown Has patient had a PCN reaction that required hospitalization: unknown Has patient had a PCN reaction occurring within the last 10 years: no If all of the above answers are NO, then may proceed with Cephalosporin use.     Review of Systems: Negative except as noted in the HPI.  Objective: No changes noted in today's physical examination. There were no vitals filed for this visit. Joshua Lee is a pleasant 58 y.o. male obese in NAD. AAO x 3.  Vascular Examination: Vascular status intact b/l with palpable pedal pulses. CFT immediate b/l. Pedal hair present. No edema. No pain with calf compression b/l. Skin temperature gradient WNL b/l. No varicosities noted. No cyanosis or clubbing noted.Patient wearing compression hose on today's visit.   Neurological Examination: Sensation grossly intact b/l with 10 gram monofilament. Vibratory sensation intact b/l.  Dermatological Examination: Pedal skin with normal turgor, texture and  tone b/l. No open wounds nor interdigital macerations noted. Toenails 1-5 b/l thick, discolored, elongated with subungual debris and pain on dorsal palpation. No hyperkeratotic lesions noted b/l. Bilateral 2nd digits are mildly incurvated. +Tenderness to palpation. No erythema, no edema, no drainage.  Musculoskeletal Examination: Muscle strength 5/5 to b/l LE.  No pain, crepitus noted b/l. No gross pedal deformities. Patient ambulates independently without assistive aids.   Radiographs: None  Assessment/Plan: 1. Pain due to onychomycosis of toenails of both feet   2. Diabetic peripheral neuropathy associated with type 2 diabetes mellitus Joshua Lee)    Consent given for treatment. Patient examined. All patient's and/or POA's questions/concerns addressed on today's visit. Toenails 1-5 b/l debrided in length and girth without incident. Continue foot and shoe inspections daily. Monitor blood glucose per PCP/Endocrinologist's recommendations. Continue soft, supportive shoe gear daily. Report any pedal injuries to medical professional. Call office if there are any questions/concerns. -Patient/POA to call should there be question/concern in the interim.   Return in about 3 months (around 03/02/2024).  Joshua Lee, DPM      [facility] LOCATION: [address], [place] [address]                   Office [phone]   [facility] LOCATION: [address] [place], [place] [address] Office [phone]

## 2023-12-02 NOTE — Telephone Encounter (Signed)
 Error

## 2023-12-06 NOTE — Progress Notes (Unsigned)
   12/07/2023  Patient ID: Joshua Lee, male   DOB: 01-15-1966, 58 y.o.   MRN: [id number]  Outreach attempt for scheduled telephone follow-up visit was not successful, but I was able to leave a HIPAA compliant voicemail with my direct phone number.  I will try to call the patient back next week if I do not hear back.  Joshua Lee, PharmD, DPLA

## 2023-12-07 ENCOUNTER — Other Ambulatory Visit: Payer: Self-pay

## 2023-12-07 DIAGNOSIS — E782 Mixed hyperlipidemia: Secondary | ICD-10-CM

## 2023-12-07 DIAGNOSIS — I10 Essential (primary) hypertension: Secondary | ICD-10-CM

## 2023-12-07 DIAGNOSIS — N183 Chronic kidney disease, stage 3 unspecified: Secondary | ICD-10-CM

## 2023-12-07 NOTE — Progress Notes (Signed)
 12/07/2023  Patient ID: [name], male   DOB: October 04, 1965, 58 y.o.   MRN: [id number]  Subjective/Objective: Patient returned my call for scheduled telephone follow-up visit   Diabetes management plan -Current medications: metformin  1000 mg 2 times daily, Mounjaro  10mg  weekly, Farxiga  10mg  daily -Jardiance  25mg  stopped several month ago based on yeast infection(s) -Cardiology prescribed Farxiga  10mg  daily and patient has been taking this approximately 1 month now and endorses tolerating well so far -Last A1c had increased to 8.7%, so Mounjaro  was increased to 10mg  weekly.  Patient has had 2 doses of this strength and endorses tolerating well so far with no adverse side effects. -Checking home BG regularly approximately 4 hours after eating supper, ant these are ranging 132-205.  Patient did get a steroid injection in his back somewhat recently (unsure exactly when), and he just completed a 5 day course of prednisone  10mg  BID for URI last week.  These factors are likely affecting current BG readings. -Does not endorse any s/sx of hypoglycemia -ACEi/ARB on board:  yes -Statin on board:  yes  Hypertension -Current medications:  benazepril /hydrochlorothiazide  20/25mg  daily, metoprolol  100mg  BID -Patient was taking amlodipine  25mg  daily and benazepril  40mg  daily; but he was recently seen at urgent care for swelling in the feet and ankles and was advised to hold both of these medications and start taking benazepril /hydrochlorothiazide  20/25mg  daily.  Continues to take metoprolol  100mg  BID -States swelling has improved but is still present -He is also wearing compression socks to help with swelling -Does not monitor home BP regularly, but last OV BP was 130/76 at 10/27 PCP visit  Hyperlipidemia -Current medications:  atorvastatin  80mg  daily, Vascepa  2g BID -TG had increased significantly as reflected by 10/27 lipid panel- patient endorses excellent adherence to current medication  regimen  Lab Results  Component Value Date   HGBA1C 8.7 (H) 11/14/2023   HGBA1C 8.0 (H) 08/02/2023   HGBA1C 7.7 (H) 05/04/2023      Component Value Date/Time   NA 138 11/14/2023 1003   K 4.4 11/14/2023 1003   CL 100 11/14/2023 1003   CO2 21 11/14/2023 1003   GLUCOSE 278 (H) 11/14/2023 1003   GLUCOSE 247 (H) 03/10/2023 1324   BUN 16 11/14/2023 1003   CREATININE 0.74 (L) 11/14/2023 1003   CALCIUM  9.1 11/14/2023 1003   PROT 6.3 11/14/2023 1003   ALBUMIN 4.0 11/14/2023 1003   AST 28 11/14/2023 1003   AST 31 08/29/2018 1045   ALT 36 11/14/2023 1003   ALT 31 08/29/2018 1045   ALKPHOS 49 11/14/2023 1003   BILITOT 0.5 11/14/2023 1003   EGFR 105 11/14/2023 1003   GFRNONAA >60 03/10/2023 1324       Component Value Date/Time   CHOL 107 11/14/2023 1003   CHOL 108 08/29/2018 1045   TRIG 264 (H) 11/14/2023 1003   TRIG 221 (H) 08/29/2018 1045   HDL 25 (L) 11/14/2023 1003   CHOLHDL 6.5 (H) 02/09/2017 1002   VLDL 44 (H) 08/29/2018 1045   LDLCALC 41 11/14/2023 1003   LABVLDL 41 (H) 11/14/2023 1003      Assessment/plan   Diabetes management plan -A1c currently uncontrolled with goal of <7% -Patient will take 2 more doses of Mounjaro  10mg  weekly, then increase to 12.5mg  weekly -Continue metformin  1000mg  BID, Farxiga  10mg  daily  -Continue to monitor and record BG daily  Hypertension -BP is at goal of <130/80 -Advised patient to continue to hold amlodipine  2.5mg  until upcoming follow-up with Dr Vicci 12/9 -He has 15  tablets of benazepril /hydrochlorothiazide  20/25mg  daily remaining; he will use these, then switch back to benazepril  40mg  daily until follow-up with PCP -Continue metoprolol  100mg  BID -Based on fluid status (and etiology), determine if patient should remain on benazepril  40mg  daily or benazepril  20/25mg  daily.  I would not expect amlodipine  2.5mg  to have caused new ankle swelling since he was on this medication quite sometime, so could consider resuming if needed for  BP control  Hyperlipidemia -LDL at goal of <70 -TG elevated from previous reading -Continue current medication regimen -Consider dietary changes to help lower TG level:  limit starchy and sugary foods/drinks, increase fiber, vegetables, whole grains and lean sources of protein (fish)  Follow-up:  1 month   [name], PharmD, DPLA

## 2023-12-12 DIAGNOSIS — M955 Acquired deformity of pelvis: Secondary | ICD-10-CM | POA: Diagnosis not present

## 2023-12-12 DIAGNOSIS — M9904 Segmental and somatic dysfunction of sacral region: Secondary | ICD-10-CM | POA: Diagnosis not present

## 2023-12-12 DIAGNOSIS — M9901 Segmental and somatic dysfunction of cervical region: Secondary | ICD-10-CM | POA: Diagnosis not present

## 2023-12-12 DIAGNOSIS — M62830 Muscle spasm of back: Secondary | ICD-10-CM | POA: Diagnosis not present

## 2023-12-12 DIAGNOSIS — M9905 Segmental and somatic dysfunction of pelvic region: Secondary | ICD-10-CM | POA: Diagnosis not present

## 2023-12-12 DIAGNOSIS — M5418 Radiculopathy, sacral and sacrococcygeal region: Secondary | ICD-10-CM | POA: Diagnosis not present

## 2023-12-12 DIAGNOSIS — M4306 Spondylolysis, lumbar region: Secondary | ICD-10-CM | POA: Diagnosis not present

## 2023-12-12 DIAGNOSIS — M9902 Segmental and somatic dysfunction of thoracic region: Secondary | ICD-10-CM | POA: Diagnosis not present

## 2023-12-12 DIAGNOSIS — M9903 Segmental and somatic dysfunction of lumbar region: Secondary | ICD-10-CM | POA: Diagnosis not present

## 2023-12-12 DIAGNOSIS — M5416 Radiculopathy, lumbar region: Secondary | ICD-10-CM | POA: Diagnosis not present

## 2023-12-12 DIAGNOSIS — M542 Cervicalgia: Secondary | ICD-10-CM | POA: Diagnosis not present

## 2023-12-14 DIAGNOSIS — M62830 Muscle spasm of back: Secondary | ICD-10-CM | POA: Diagnosis not present

## 2023-12-14 DIAGNOSIS — M955 Acquired deformity of pelvis: Secondary | ICD-10-CM | POA: Diagnosis not present

## 2023-12-14 DIAGNOSIS — M9901 Segmental and somatic dysfunction of cervical region: Secondary | ICD-10-CM | POA: Diagnosis not present

## 2023-12-14 DIAGNOSIS — M9902 Segmental and somatic dysfunction of thoracic region: Secondary | ICD-10-CM | POA: Diagnosis not present

## 2023-12-14 DIAGNOSIS — M9905 Segmental and somatic dysfunction of pelvic region: Secondary | ICD-10-CM | POA: Diagnosis not present

## 2023-12-14 DIAGNOSIS — M9904 Segmental and somatic dysfunction of sacral region: Secondary | ICD-10-CM | POA: Diagnosis not present

## 2023-12-14 DIAGNOSIS — M542 Cervicalgia: Secondary | ICD-10-CM | POA: Diagnosis not present

## 2023-12-14 DIAGNOSIS — M5418 Radiculopathy, sacral and sacrococcygeal region: Secondary | ICD-10-CM | POA: Diagnosis not present

## 2023-12-14 DIAGNOSIS — M9903 Segmental and somatic dysfunction of lumbar region: Secondary | ICD-10-CM | POA: Diagnosis not present

## 2023-12-14 DIAGNOSIS — M4306 Spondylolysis, lumbar region: Secondary | ICD-10-CM | POA: Diagnosis not present

## 2023-12-14 DIAGNOSIS — M5416 Radiculopathy, lumbar region: Secondary | ICD-10-CM | POA: Diagnosis not present

## 2023-12-19 DIAGNOSIS — M62830 Muscle spasm of back: Secondary | ICD-10-CM | POA: Diagnosis not present

## 2023-12-19 DIAGNOSIS — M955 Acquired deformity of pelvis: Secondary | ICD-10-CM | POA: Diagnosis not present

## 2023-12-19 DIAGNOSIS — M9905 Segmental and somatic dysfunction of pelvic region: Secondary | ICD-10-CM | POA: Diagnosis not present

## 2023-12-19 DIAGNOSIS — M9901 Segmental and somatic dysfunction of cervical region: Secondary | ICD-10-CM | POA: Diagnosis not present

## 2023-12-19 DIAGNOSIS — M9903 Segmental and somatic dysfunction of lumbar region: Secondary | ICD-10-CM | POA: Diagnosis not present

## 2023-12-19 DIAGNOSIS — M542 Cervicalgia: Secondary | ICD-10-CM | POA: Diagnosis not present

## 2023-12-19 DIAGNOSIS — M4306 Spondylolysis, lumbar region: Secondary | ICD-10-CM | POA: Diagnosis not present

## 2023-12-19 DIAGNOSIS — M5418 Radiculopathy, sacral and sacrococcygeal region: Secondary | ICD-10-CM | POA: Diagnosis not present

## 2023-12-19 DIAGNOSIS — M5416 Radiculopathy, lumbar region: Secondary | ICD-10-CM | POA: Diagnosis not present

## 2023-12-19 DIAGNOSIS — M9902 Segmental and somatic dysfunction of thoracic region: Secondary | ICD-10-CM | POA: Diagnosis not present

## 2023-12-19 DIAGNOSIS — M9904 Segmental and somatic dysfunction of sacral region: Secondary | ICD-10-CM | POA: Diagnosis not present

## 2023-12-21 DIAGNOSIS — M5416 Radiculopathy, lumbar region: Secondary | ICD-10-CM | POA: Diagnosis not present

## 2023-12-21 DIAGNOSIS — M62830 Muscle spasm of back: Secondary | ICD-10-CM | POA: Diagnosis not present

## 2023-12-21 DIAGNOSIS — M542 Cervicalgia: Secondary | ICD-10-CM | POA: Diagnosis not present

## 2023-12-21 DIAGNOSIS — M9902 Segmental and somatic dysfunction of thoracic region: Secondary | ICD-10-CM | POA: Diagnosis not present

## 2023-12-21 DIAGNOSIS — M4306 Spondylolysis, lumbar region: Secondary | ICD-10-CM | POA: Diagnosis not present

## 2023-12-21 DIAGNOSIS — M9903 Segmental and somatic dysfunction of lumbar region: Secondary | ICD-10-CM | POA: Diagnosis not present

## 2023-12-21 DIAGNOSIS — M9901 Segmental and somatic dysfunction of cervical region: Secondary | ICD-10-CM | POA: Diagnosis not present

## 2023-12-21 DIAGNOSIS — M955 Acquired deformity of pelvis: Secondary | ICD-10-CM | POA: Diagnosis not present

## 2023-12-21 DIAGNOSIS — M5418 Radiculopathy, sacral and sacrococcygeal region: Secondary | ICD-10-CM | POA: Diagnosis not present

## 2023-12-21 DIAGNOSIS — M9905 Segmental and somatic dysfunction of pelvic region: Secondary | ICD-10-CM | POA: Diagnosis not present

## 2023-12-21 DIAGNOSIS — M9904 Segmental and somatic dysfunction of sacral region: Secondary | ICD-10-CM | POA: Diagnosis not present

## 2023-12-22 DIAGNOSIS — M25561 Pain in right knee: Secondary | ICD-10-CM | POA: Diagnosis not present

## 2023-12-22 DIAGNOSIS — M7061 Trochanteric bursitis, right hip: Secondary | ICD-10-CM | POA: Diagnosis not present

## 2023-12-23 ENCOUNTER — Telehealth: Payer: Self-pay | Admitting: Pharmacy Technician

## 2023-12-23 NOTE — Telephone Encounter (Signed)
 Pharmacy Patient Advocate Encounter   Received notification from Onbase that prior authorization for Ozempic  (0.25 or 0.5 MG/DOSE) 2MG /3ML pen-injectors  is due for renewal.   Insurance verification completed.   The patient is insured through [employer].  Action: Medication has been discontinued. Archived Key: [id number]

## 2023-12-27 ENCOUNTER — Ambulatory Visit: Admitting: Family Medicine

## 2023-12-27 ENCOUNTER — Encounter: Payer: Self-pay | Admitting: Family Medicine

## 2023-12-27 VITALS — BP 135/82 | HR 85 | Temp 97.9°F | Ht 67.0 in | Wt 254.0 lb

## 2023-12-27 DIAGNOSIS — I10 Essential (primary) hypertension: Secondary | ICD-10-CM

## 2023-12-27 DIAGNOSIS — E1122 Type 2 diabetes mellitus with diabetic chronic kidney disease: Secondary | ICD-10-CM | POA: Diagnosis not present

## 2023-12-27 DIAGNOSIS — Z7985 Long-term (current) use of injectable non-insulin antidiabetic drugs: Secondary | ICD-10-CM | POA: Diagnosis not present

## 2023-12-27 DIAGNOSIS — N183 Chronic kidney disease, stage 3 unspecified: Secondary | ICD-10-CM | POA: Diagnosis not present

## 2023-12-27 LAB — BAYER DCA HB A1C WAIVED: HB A1C (BAYER DCA - WAIVED): 8.7 % — ABNORMAL HIGH (ref 4.8–5.6)

## 2023-12-27 MED ORDER — BENAZEPRIL HCL 40 MG PO TABS: 40.0000 mg | ORAL_TABLET | Freq: Every day | ORAL | 0 refills | Status: AC

## 2023-12-27 MED ORDER — TIRZEPATIDE 15 MG/0.5ML ~~LOC~~ SOAJ: 15.0000 mg | SUBCUTANEOUS | 1 refills | Status: AC

## 2023-12-27 NOTE — Assessment & Plan Note (Signed)
 No change on his A1c at 8.7- continue farxiga . Continue titration up to 15mg  mounjaro - has 3 weeks left on the 12.5mg . Follow up 6 weeks. Call with any concerns.

## 2023-12-27 NOTE — Progress Notes (Signed)
 BP 135/82   Pulse 85   Temp 97.9 F (36.6 C) (Oral)   Ht 5' 7 (1.702 m)   Wt 254 lb (115.2 kg)   SpO2 97%   BMI 39.78 kg/m    Subjective:    Patient ID: Joshua Lee, male    DOB: April 15, 1965, 58 y.o.   MRN: [id number]  HPI: Joshua Lee is a 58 y.o. male  Chief Complaint  Patient presents with   Diabetes   DIABETES Hypoglycemic episodes:no Polydipsia/polyuria: no Visual disturbance: no Chest pain: no Paresthesias: no Glucose Monitoring: yes  Accucheck frequency: occasionally Taking Insulin ?: no Blood Pressure Monitoring: not checking Retinal Examination: Up to Date Foot Exam: Up to Date Diabetic Education: Completed Pneumovax: Up to Date Influenza: Up to Date Aspirin : yes  Relevant past medical, surgical, family and social history reviewed and updated as indicated. Interim medical history since our last visit reviewed. Allergies and medications reviewed and updated.  Review of Systems  Constitutional: Negative.   Respiratory: Negative.    Cardiovascular: Negative.   Musculoskeletal: Negative.   Psychiatric/Behavioral: Negative.      Per HPI unless specifically indicated above     Objective:    BP 135/82   Pulse 85   Temp 97.9 F (36.6 C) (Oral)   Ht 5' 7 (1.702 m)   Wt 254 lb (115.2 kg)   SpO2 97%   BMI 39.78 kg/m   Wt Readings from Last 3 Encounters:  12/27/23 254 lb (115.2 kg)  11/14/23 255 lb (115.7 kg)  11/03/23 259 lb (117.5 kg)    Physical Exam Vitals and nursing note reviewed.  Constitutional:      General: He is not in acute distress.    Appearance: Normal appearance. He is not ill-appearing, toxic-appearing or diaphoretic.  HENT:     Head: Normocephalic and atraumatic.     Right Ear: External ear normal.     Left Ear: External ear normal.     Nose: Nose normal.     Mouth/Throat:     Mouth: Mucous membranes are moist.     Pharynx: Oropharynx is clear.  Eyes:     General: No scleral icterus.       Right eye: No  discharge.        Left eye: No discharge.     Extraocular Movements: Extraocular movements intact.     Conjunctiva/sclera: Conjunctivae normal.     Pupils: Pupils are equal, round, and reactive to light.  Cardiovascular:     Rate and Rhythm: Normal rate and regular rhythm.     Pulses: Normal pulses.     Heart sounds: Normal heart sounds. No murmur heard.    No friction rub. No gallop.  Pulmonary:     Effort: Pulmonary effort is normal. No respiratory distress.     Breath sounds: Normal breath sounds. No stridor. No wheezing, rhonchi or rales.  Chest:     Chest wall: No tenderness.  Musculoskeletal:        General: Normal range of motion.     Cervical back: Normal range of motion and neck supple.  Skin:    General: Skin is warm and dry.     Capillary Refill: Capillary refill takes less than 2 seconds.     Coloration: Skin is not jaundiced or pale.     Findings: No bruising, erythema, lesion or rash.  Neurological:     General: No focal deficit present.     Mental Status: He is alert and oriented  to person, place, and time. Mental status is at baseline.  Psychiatric:        Mood and Affect: Mood normal.        Behavior: Behavior normal.        Thought Content: Thought content normal.        Judgment: Judgment normal.     Results for orders placed or performed in visit on 11/14/23  Microalbumin, Urine Waived   Collection Time: 11/14/23 10:02 AM  Result Value Ref Range   Microalb, Ur Waived 30 (H) 0 - 19 mg/L   Creatinine, Urine Waived 100 10 - 300 mg/dL   Microalb/Creat Ratio 30-300 (H) <30 mg/g  Bayer DCA Hb A1c Waived   Collection Time: 11/14/23 10:02 AM  Result Value Ref Range   HB A1C (BAYER DCA - WAIVED) 8.7 (H) 4.8 - 5.6 %  Comprehensive metabolic panel with GFR   Collection Time: 11/14/23 10:03 AM  Result Value Ref Range   Glucose 278 (H) 70 - 99 mg/dL   BUN 16 6 - 24 mg/dL   Creatinine, Ser 9.25 (L) 0.76 - 1.27 mg/dL   eGFR 894 >40 fO/fpw/8.26   BUN/Creatinine  Ratio 22 (H) 9 - 20   Sodium 138 134 - 144 mmol/L   Potassium 4.4 3.5 - 5.2 mmol/L   Chloride 100 96 - 106 mmol/L   CO2 21 20 - 29 mmol/L   Calcium  9.1 8.7 - 10.2 mg/dL   Total Protein 6.3 6.0 - 8.5 g/dL   Albumin 4.0 3.8 - 4.9 g/dL   Globulin, Total 2.3 1.5 - 4.5 g/dL   Bilirubin Total 0.5 0.0 - 1.2 mg/dL   Alkaline Phosphatase 49 47 - 123 IU/L   AST 28 0 - 40 IU/L   ALT 36 0 - 44 IU/L  CBC with Differential/Platelet   Collection Time: 11/14/23 10:03 AM  Result Value Ref Range   WBC 7.5 3.4 - 10.8 x10E3/uL   RBC 4.49 4.14 - 5.80 x10E6/uL   Hemoglobin 12.5 (L) 13.0 - 17.7 g/dL   Hematocrit 59.1 62.4 - 51.0 %   MCV 91 79 - 97 fL   MCH 27.8 26.6 - 33.0 pg   MCHC 30.6 (L) 31.5 - 35.7 g/dL   RDW 85.3 88.3 - 84.5 %   Platelets 236 150 - 450 x10E3/uL   Neutrophils 74 Not Estab. %   Lymphs 18 Not Estab. %   Monocytes 7 Not Estab. %   Eos 1 Not Estab. %   Basos 0 Not Estab. %   Neutrophils Absolute 5.5 1.4 - 7.0 x10E3/uL   Lymphocytes Absolute 1.4 0.7 - 3.1 x10E3/uL   Monocytes Absolute 0.5 0.1 - 0.9 x10E3/uL   EOS (ABSOLUTE) 0.1 0.0 - 0.4 x10E3/uL   Basophils Absolute 0.0 0.0 - 0.2 x10E3/uL   Immature Granulocytes 0 Not Estab. %   Immature Grans (Abs) 0.0 0.0 - 0.1 x10E3/uL  Lipid Panel w/o Chol/HDL Ratio   Collection Time: 11/14/23 10:03 AM  Result Value Ref Range   Cholesterol, Total 107 100 - 199 mg/dL   Triglycerides 735 (H) 0 - 149 mg/dL   HDL 25 (L) >60 mg/dL   VLDL Cholesterol Cal 41 (H) 5 - 40 mg/dL   LDL Chol Calc (NIH) 41 0 - 99 mg/dL  PSA   Collection Time: 11/14/23 10:03 AM  Result Value Ref Range   Prostate Specific Ag, Serum 0.4 0.0 - 4.0 ng/mL  TSH   Collection Time: 11/14/23 10:03 AM  Result Value Ref Range  TSH 1.870 0.450 - 4.500 uIU/mL      Assessment & Plan:   Problem List Items Addressed This Visit       Cardiovascular and Mediastinum   Hypertension   Under good control on current regimen. Will go back to benazepril  40mg . Will finish his  current Rx of benazepril -hydrochlorothiazide  and recheck in 6 weeks.       Relevant Medications   benazepril  (LOTENSIN ) 40 MG tablet     Endocrine   Type 2 diabetes mellitus with stage 3 chronic kidney disease, without long-term current use of insulin  (HCC) - Primary   No change on his A1c at 8.7- continue farxiga . Continue titration up to 15mg  mounjaro - has 3 weeks left on the 12.5mg . Follow up 6 weeks. Call with any concerns.       Relevant Medications   benazepril  (LOTENSIN ) 40 MG tablet   tirzepatide  (MOUNJARO ) 15 MG/0.5ML Pen (Start on 01/10/2024)   Other Relevant Orders   Bayer DCA Hb A1c Waived     Follow up plan: Return in about 6 weeks (around 02/07/2024).

## 2023-12-27 NOTE — Assessment & Plan Note (Signed)
 Under good control on current regimen. Will go back to benazepril  40mg . Will finish his current Rx of benazepril -hydrochlorothiazide  and recheck in 6 weeks.

## 2024-01-03 ENCOUNTER — Ambulatory Visit: Admitting: Cardiovascular Disease

## 2024-01-03 ENCOUNTER — Encounter: Payer: Self-pay | Admitting: Cardiovascular Disease

## 2024-01-03 VITALS — BP 129/83 | HR 81 | Ht 67.0 in | Wt 244.0 lb

## 2024-01-03 DIAGNOSIS — N182 Chronic kidney disease, stage 2 (mild): Secondary | ICD-10-CM

## 2024-01-03 DIAGNOSIS — R609 Edema, unspecified: Secondary | ICD-10-CM | POA: Diagnosis not present

## 2024-01-03 DIAGNOSIS — I10 Essential (primary) hypertension: Secondary | ICD-10-CM | POA: Diagnosis not present

## 2024-01-03 DIAGNOSIS — I5033 Acute on chronic diastolic (congestive) heart failure: Secondary | ICD-10-CM

## 2024-01-03 DIAGNOSIS — E782 Mixed hyperlipidemia: Secondary | ICD-10-CM

## 2024-01-03 DIAGNOSIS — E1165 Type 2 diabetes mellitus with hyperglycemia: Secondary | ICD-10-CM | POA: Diagnosis not present

## 2024-01-03 DIAGNOSIS — E119 Type 2 diabetes mellitus without complications: Secondary | ICD-10-CM

## 2024-01-03 DIAGNOSIS — E669 Obesity, unspecified: Secondary | ICD-10-CM | POA: Diagnosis not present

## 2024-01-03 NOTE — Progress Notes (Signed)
 Cardiology Office Note   Date:  01/03/2024   ID:  Joshua Lee, DOB [birth year], MRN [id number]  PCP:  Vicci Duwaine SQUIBB, DO  Cardiologist:  Denyse Bathe, MD      History of Present Illness: Joshua Lee is a 58 y.o. male who presents for  Chief Complaint  Patient presents with   Follow-up    2 month follow up    Had swelling of legs.      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     aspirin  81 MG tablet Take 81 mg by mouth daily.     atorvastatin  (LIPITOR) 80 MG tablet Take 1 tablet (80 mg total) by mouth daily. 90 tablet 1   benazepril  (LOTENSIN ) 40 MG tablet Take 1 tablet (40 mg total) by mouth daily. 90 tablet 0   Blood Glucose Monitoring Suppl (ACCU-CHEK GUIDE ME) w/Device KIT Use to check blood glucose two times daily 1 kit 0   Coenzyme Q-10 100 MG capsule Take 100 mg by mouth daily.     cyclobenzaprine  (FLEXERIL ) 10 MG tablet Take 1 tablet (10 mg total) by mouth at bedtime. 30 tablet 1   dapagliflozin  propanediol (FARXIGA ) 10 MG TABS tablet Take 1 tablet (10 mg total) by mouth daily before breakfast. 30 tablet 3   diclofenac  Sodium (VOLTAREN ) 1 % GEL Apply 4 g topically 4 (four)  times daily. 350 g 12   FLUoxetine  (PROZAC ) 10 MG capsule Take 1 capsule (10 mg total) by mouth daily. 90 capsule 1   fluticasone  (FLONASE ) 50 MCG/ACT nasal spray Place 1 spray into both nostrils 2 (two) times daily. 48 mL 4   gabapentin (NEURONTIN) 300 MG capsule Take 300 mg by mouth 3 (three) times daily as needed.     Garlic 1000 MG CAPS Take 1,000 mg by mouth daily.     glucose blood (ACCU-CHEK GUIDE TEST) test strip 1 each by Other route 2 (two) times daily. 200 each 3   icosapent  Ethyl (VASCEPA ) 1 g capsule Take 2 capsules (2 g total) by mouth 2 (two) times daily. 360 capsule 1   metFORMIN  (GLUCOPHAGE ) 500 MG tablet TAKE 2 TABLETS(1000 MG) BY MOUTH TWICE DAILY WITH A MEAL 360 tablet 1   metoprolol  tartrate (LOPRESSOR ) 100 MG tablet Take 1 tablet (100 mg total) by mouth 2 (two) times daily. 180 tablet 1   nystatin  (MYCOSTATIN /NYSTOP ) powder APPLY TOPICALLY 3 TIMES A DAY 60 g 3   Potassium Citrate 15 MEQ (1620 MG) TBCR Take 15 mEq by mouth 2 (two) times daily. 2 tab QAM, 1 tab QPM  1   saline (AYR) GEL Place  1 Application into both nostrils every 6 (six) hours as needed. 14.1 g 6   tamsulosin  (FLOMAX ) 0.4 MG CAPS capsule TAKE 1 CAPSULE BY MOUTH EVERY DAY 90 capsule 0   tirzepatide  (MOUNJARO ) 12.5 MG/0.5ML Pen Inject 12.5 mg into the skin once a week. 2 mL 0   [START ON 01/10/2024] tirzepatide  (MOUNJARO ) 15 MG/0.5ML Pen Inject 15 mg into the skin once a week. 6 mL 1   No current facility-administered medications for this visit.    Allergies:   Penicillins    Social History:   reports that he has never smoked. His smokeless tobacco use includes snuff. He reports that he does not currently use alcohol. He reports that he does not use drugs.   Family History:  family history includes Diabetes in his maternal uncle.    ROS:     Review of Systems  Constitutional: Negative.   HENT: Negative.    Eyes: Negative.   Respiratory: Negative.    Gastrointestinal: Negative.   Genitourinary:  Negative.   Musculoskeletal: Negative.   Skin: Negative.   Neurological: Negative.   Endo/Heme/Allergies: Negative.   Psychiatric/Behavioral: Negative.    All other systems reviewed and are negative.     All other systems are reviewed and negative.    PHYSICAL EXAM: VS:  BP 129/83   Pulse 81   Ht 5' 7 (1.702 m)   Wt 244 lb (110.7 kg)   SpO2 97%   BMI 38.22 kg/m  , BMI Body mass index is 38.22 kg/m. Last weight:  Wt Readings from Last 3 Encounters:  01/03/24 244 lb (110.7 kg)  12/27/23 254 lb (115.2 kg)  11/14/23 255 lb (115.7 kg)     Physical Exam Vitals reviewed.  Constitutional:      Appearance: Normal appearance. He is normal weight.  HENT:     Head: Normocephalic.     Nose: Nose normal.     Mouth/Throat:     Mouth: Mucous membranes are moist.  Eyes:     Pupils: Pupils are equal, round, and reactive to light.  Cardiovascular:     Rate and Rhythm: Normal rate and regular rhythm.     Pulses: Normal pulses.     Heart sounds: Normal heart sounds.  Pulmonary:     Effort: Pulmonary effort is normal.  Abdominal:     General: Abdomen is flat. Bowel sounds are normal.  Musculoskeletal:        General: Normal range of motion.     Cervical back: Normal range of motion.  Skin:    General: Skin is warm.  Neurological:     General: No focal deficit present.     Mental Status: He is alert.  Psychiatric:        Mood and Affect: Mood normal.       EKG:   Recent Labs: 11/14/2023: ALT 36; BUN 16; Creatinine, Ser 0.74; Hemoglobin 12.5; Platelets 236; Potassium 4.4; Sodium 138; TSH 1.870    Lipid Panel    Component Value Date/Time   CHOL 107 11/14/2023 1003   CHOL 108 08/29/2018 1045   TRIG 264 (H) 11/14/2023 1003   TRIG 221 (H) 08/29/2018 1045   HDL 25 (L) 11/14/2023 1003   CHOLHDL 6.5 (H) 02/09/2017 1002   VLDL 44 (H) 08/29/2018 1045   LDLCALC 41 11/14/2023 1003      Other studies Reviewed: Additional studies/ records that were reviewed today  include:  Review of the above records demonstrates:       No  data to display            ASSESSMENT AND PLAN:    ICD-10-CM   1. Primary hypertension  I10 PCV ECHOCARDIOGRAM COMPLETE    2. Obesity, diabetes, and hypertension syndrome (HCC)  E66.9 PCV ECHOCARDIOGRAM COMPLETE   I10    E11.9     3. Mixed hyperlipidemia  E78.2 PCV ECHOCARDIOGRAM COMPLETE    4. CKD (chronic kidney disease), stage II  N18.2 PCV ECHOCARDIOGRAM COMPLETE   Off amlodapine    5. Edema, unspecified type  R60.9 PCV ECHOCARDIOGRAM COMPLETE   Had swelling of legs with inability to walk, and started on diuretics and stopped amlodapine. Its getting better.    6. CHF (congestive heart failure), NYHA class III, acute on chronic, diastolic (HCC)  I50.33 PCV ECHOCARDIOGRAM COMPLETE   H/O LVEF 53 % and diastolic dysfunction       Problem List Items Addressed This Visit       Cardiovascular and Mediastinum   Hypertension - Primary   Relevant Orders   PCV ECHOCARDIOGRAM COMPLETE   Obesity, diabetes, and hypertension syndrome (HCC)   Relevant Orders   PCV ECHOCARDIOGRAM COMPLETE     Genitourinary   CKD (chronic kidney disease), stage II   Relevant Orders   PCV ECHOCARDIOGRAM COMPLETE     Other   Hyperlipidemia   Relevant Orders   PCV ECHOCARDIOGRAM COMPLETE   Other Visit Diagnoses       Edema, unspecified type       Had swelling of legs with inability to walk, and started on diuretics and stopped amlodapine. Its getting better.   Relevant Orders   PCV ECHOCARDIOGRAM COMPLETE     CHF (congestive heart failure), NYHA class III, acute on chronic, diastolic (HCC)       H/O LVEF 53 % and diastolic dysfunction   Relevant Orders   PCV ECHOCARDIOGRAM COMPLETE          Disposition:   Return in about 6 weeks (around 02/14/2024) for echo, and f/u.    Total time spent: 35 minutes  Signed,  Denyse Bathe, MD  01/03/2024 10:47 AM    [facility]

## 2024-01-05 NOTE — Progress Notes (Unsigned)
 01/05/2024  Patient ID: [name], male   DOB: [birth year], 58 y.o.   MRN: [id number]  Subjective/Objective: Patient returned my call for scheduled telephone follow-up visit   Diabetes management plan -Current medications: metformin  1000 mg 2 times daily, Mounjaro  10mg  weekly, Farxiga  10mg  daily -Jardiance  25mg  stopped several month ago based on yeast infection(s) -Cardiology prescribed Farxiga  10mg  daily and patient has been taking this approximately 1 month now and endorses tolerating well so far -Last A1c had increased to 8.7%, so Mounjaro  was increased to 10mg  weekly.  Patient has had 2 doses of this strength and endorses tolerating well so far with no adverse side effects. -Checking home BG regularly approximately 4 hours after eating supper, ant these are ranging 132-205.  Patient did get a steroid injection in his back somewhat recently (unsure exactly when), and he just completed a 5 day course of prednisone  10mg  BID for URI last week.  These factors are likely affecting current BG readings. -Does not endorse any s/sx of hypoglycemia -ACEi/ARB on board:  yes -Statin on board:  yes  Hypertension -Current medications:  benazepril /hydrochlorothiazide  20/25mg  daily, metoprolol  100mg  BID -Patient was taking amlodipine  25mg  daily and benazepril  40mg  daily; but he was recently seen at urgent care for swelling in the feet and ankles and was advised to hold both of these medications and start taking benazepril /hydrochlorothiazide  20/25mg  daily.  Continues to take metoprolol  100mg  BID -States swelling has improved but is still present -He is also wearing compression socks to help with swelling -Does not monitor home BP regularly, but last OV BP was 130/76 at 10/27 PCP visit  Hyperlipidemia -Current medications:  atorvastatin  80mg  daily, Vascepa  2g BID -TG had increased significantly as reflected by 10/27 lipid panel- patient endorses excellent adherence to current medication  regimen  Lab Results  Component Value Date   HGBA1C 8.7 (H) 12/27/2023   HGBA1C 8.7 (H) 11/14/2023   HGBA1C 8.0 (H) 08/02/2023      Component Value Date/Time   NA 138 11/14/2023 1003   K 4.4 11/14/2023 1003   CL 100 11/14/2023 1003   CO2 21 11/14/2023 1003   GLUCOSE 278 (H) 11/14/2023 1003   GLUCOSE 247 (H) 03/10/2023 1324   BUN 16 11/14/2023 1003   CREATININE 0.74 (L) 11/14/2023 1003   CALCIUM  9.1 11/14/2023 1003   PROT 6.3 11/14/2023 1003   ALBUMIN 4.0 11/14/2023 1003   AST 28 11/14/2023 1003   AST 31 08/29/2018 1045   ALT 36 11/14/2023 1003   ALT 31 08/29/2018 1045   ALKPHOS 49 11/14/2023 1003   BILITOT 0.5 11/14/2023 1003   EGFR 105 11/14/2023 1003   GFRNONAA >60 03/10/2023 1324       Component Value Date/Time   CHOL 107 11/14/2023 1003   CHOL 108 08/29/2018 1045   TRIG 264 (H) 11/14/2023 1003   TRIG 221 (H) 08/29/2018 1045   HDL 25 (L) 11/14/2023 1003   CHOLHDL 6.5 (H) 02/09/2017 1002   VLDL 44 (H) 08/29/2018 1045   LDLCALC 41 11/14/2023 1003   LABVLDL 41 (H) 11/14/2023 1003      Assessment/plan   Diabetes management plan -A1c currently uncontrolled with goal of <7% -Patient will take 2 more doses of Mounjaro  10mg  weekly, then increase to 12.5mg  weekly -Continue metformin  1000mg  BID, Farxiga  10mg  daily  -Continue to monitor and record BG daily  Hypertension -BP is at goal of <130/80 -Advised patient to continue to hold amlodipine  2.5mg  until upcoming follow-up with Dr Vicci 12/9 -He has 15  tablets of benazepril /hydrochlorothiazide  20/25mg  daily remaining; he will use these, then switch back to benazepril  40mg  daily until follow-up with PCP -Continue metoprolol  100mg  BID -Based on fluid status (and etiology), determine if patient should remain on benazepril  40mg  daily or benazepril  20/25mg  daily.  I would not expect amlodipine  2.5mg  to have caused new ankle swelling since he was on this medication quite sometime, so could consider resuming if needed for  BP control  Hyperlipidemia -LDL at goal of <70 -TG elevated from previous reading -Continue current medication regimen -Consider dietary changes to help lower TG level:  limit starchy and sugary foods/drinks, increase fiber, vegetables, whole grains and lean sources of protein (fish)  Follow-up:  1 month   [name], PharmD, DPLA

## 2024-01-06 ENCOUNTER — Other Ambulatory Visit: Payer: Self-pay

## 2024-01-10 ENCOUNTER — Telehealth: Payer: Self-pay

## 2024-01-10 DIAGNOSIS — E782 Mixed hyperlipidemia: Secondary | ICD-10-CM

## 2024-01-10 DIAGNOSIS — E1122 Type 2 diabetes mellitus with diabetic chronic kidney disease: Secondary | ICD-10-CM

## 2024-01-10 DIAGNOSIS — I10 Essential (primary) hypertension: Secondary | ICD-10-CM

## 2024-01-10 NOTE — Progress Notes (Signed)
" ° °  01/10/2024  Patient ID: Joshua Lee, male   DOB: [birth year], 58 y.o.   MRN: [id number]  Subjective/Objective: Patient returned my call for scheduled telephone follow-up visit   Diabetes management plan -Current medications: metformin  1000 mg 2 times daily, Mounjaro  12.5mg  weekly, Farxiga  10mg  daily -Jardiance  25mg  stopped several months ago based on yeast infection(s) -Cardiology prescribed Farxiga  10mg  daily and patient has been taking this approximately 2 months now and endorses tolerating well so far -Last A1c remained at 8.7%, so Mounjaro  was increased to 15mg  weekly.  Patient has two 12.5mg  pens remaining before he increases to this dose. -Checking home BG regularly approximately 4 hours after eating supper, ant these are starting to trend down some (124 last night) -likely that steroid injections were increasing previous readings -Does not endorse any s/sx of hypoglycemia -ACEi/ARB on board:  yes- benazepril  40mg  daily (recently changed from benazepril /hydrochlorothiazide ); last office BP was 129/83 on 12/16 at internal med.  Patient has home BP monitor but does not check regularly due to forgetting. -UACR 30-300 on 10/27 -Statin on board:  yes  Hyperlipidemia -Current medications:  atorvastatin  80mg  daily, Vascepa  2g BID -TG had increased significantly as reflected by 10/27 lipid panel- patient endorses excellent adherence to current medication regimen  Lab Results  Component Value Date   HGBA1C 8.7 (H) 12/27/2023   HGBA1C 8.7 (H) 11/14/2023   HGBA1C 8.0 (H) 08/02/2023      Component Value Date/Time   NA 138 11/14/2023 1003   K 4.4 11/14/2023 1003   CL 100 11/14/2023 1003   CO2 21 11/14/2023 1003   GLUCOSE 278 (H) 11/14/2023 1003   GLUCOSE 247 (H) 03/10/2023 1324   BUN 16 11/14/2023 1003   CREATININE 0.74 (L) 11/14/2023 1003   CALCIUM  9.1 11/14/2023 1003   PROT 6.3 11/14/2023 1003   ALBUMIN 4.0 11/14/2023 1003   AST 28 11/14/2023 1003   AST 31 08/29/2018 1045    ALT 36 11/14/2023 1003   ALT 31 08/29/2018 1045   ALKPHOS 49 11/14/2023 1003   BILITOT 0.5 11/14/2023 1003   EGFR 105 11/14/2023 1003   GFRNONAA >60 03/10/2023 1324       Component Value Date/Time   CHOL 107 11/14/2023 1003   CHOL 108 08/29/2018 1045   TRIG 264 (H) 11/14/2023 1003   TRIG 221 (H) 08/29/2018 1045   HDL 25 (L) 11/14/2023 1003   CHOLHDL 6.5 (H) 02/09/2017 1002   VLDL 44 (H) 08/29/2018 1045   LDLCALC 41 11/14/2023 1003   LABVLDL 41 (H) 11/14/2023 1003      Assessment/plan   Diabetes management plan -A1c currently uncontrolled with goal of <7% -Patient will take 2 more doses of Mounjaro  12.5mg  weekly, then increase to 15mg  weekly -Continue metformin  1000mg  BID, Farxiga  10mg  daily  -Continue to monitor and record BG daily -BP is moderately controlled with goal of <130/80 -LDL is at goal of <70 -UACR is not at goal- patient is on ACEi and SGLT2- continue to monitor  Hyperlipidemia -LDL at goal of <70 -TG elevated from previous reading -Continue current medication regimen -Consider dietary changes to help lower TG level:  limit starchy and sugary foods/drinks, increase fiber, vegetables, whole grains and lean sources of protein (fish)  Follow-up:  1/20   Joshua Lee, PharmD, DPLA  "

## 2024-01-15 ENCOUNTER — Other Ambulatory Visit: Payer: Self-pay | Admitting: Family Medicine

## 2024-01-18 ENCOUNTER — Other Ambulatory Visit: Payer: Self-pay

## 2024-01-21 ENCOUNTER — Other Ambulatory Visit: Payer: Self-pay | Admitting: Family Medicine

## 2024-01-21 DIAGNOSIS — N183 Chronic kidney disease, stage 3 unspecified: Secondary | ICD-10-CM

## 2024-01-24 NOTE — Telephone Encounter (Signed)
 Requested medication (s) are due for refill today: no protocol attached  Requested medication (s) are on the active medication list: yes   Last refill:  01/05/23 #200 3 refills  Future visit scheduled: na   Notes to clinic:  no protocol. Do you want to renew Rx?     Requested Prescriptions  Pending Prescriptions Disp Refills   ACCU-CHEK GUIDE TEST test strip [Pharmacy Med Name: ACCU-CHEK GUIDE TEST STRIP] 50 strip 15    Sig: USE TO CHECK BLOOD SUGAR TWO TIMES A DAY     There is no refill protocol information for this order

## 2024-02-06 NOTE — Progress Notes (Unsigned)
" ° °  02/07/24  Patient ID: Joshua Lee, male   DOB: November 03, 1965, 59 y.o.   MRN: [id number]  Subjective/Objective: Patient returned my call for scheduled telephone follow-up visit   Diabetes management plan -Current medications: metformin  1000 mg 2 times daily, Mounjaro  15mg  weekly, Farxiga  10mg  daily -Jardiance  25mg  stopped several months ago based on yeast infection(s) -Cardiology prescribed Farxiga  10mg  daily and patient endorses tolerating well -Patient has had 2 doses of Mounjaro  15mg  weekly and endorses tolerating well so far -Checking home BG regularly approximately 4 hours after eating supper, ant these are starting to trend down some- these were in the high 100's at times but are now 107-127 since Mounjaro  dose increase -Does not endorse any s/sx of hypoglycemia -ACEi/ARB on board:  yes- benazepril  40mg  daily (recently changed from benazepril /hydrochlorothiazide ); last office BP was 129/83 on 12/16 at internal med.  Patient has home BP monitor but does not check regularly due to forgetting. -UACR 30-300 on 10/27 -Statin on board:  yes  Hyperlipidemia -Current medications:  atorvastatin  80mg  daily, Vascepa  2g BID -TG had increased significantly as reflected by 10/27 lipid panel- patient endorses excellent adherence to current medication regimen  Lab Results  Component Value Date   HGBA1C 8.7 (H) 12/27/2023   HGBA1C 8.7 (H) 11/14/2023   HGBA1C 8.0 (H) 08/02/2023      Component Value Date/Time   NA 138 11/14/2023 1003   K 4.4 11/14/2023 1003   CL 100 11/14/2023 1003   CO2 21 11/14/2023 1003   GLUCOSE 278 (H) 11/14/2023 1003   GLUCOSE 247 (H) 03/10/2023 1324   BUN 16 11/14/2023 1003   CREATININE 0.74 (L) 11/14/2023 1003   CALCIUM  9.1 11/14/2023 1003   PROT 6.3 11/14/2023 1003   ALBUMIN 4.0 11/14/2023 1003   AST 28 11/14/2023 1003   AST 31 08/29/2018 1045   ALT 36 11/14/2023 1003   ALT 31 08/29/2018 1045   ALKPHOS 49 11/14/2023 1003   BILITOT 0.5 11/14/2023 1003    EGFR 105 11/14/2023 1003   GFRNONAA >60 03/10/2023 1324       Component Value Date/Time   CHOL 107 11/14/2023 1003   CHOL 108 08/29/2018 1045   TRIG 264 (H) 11/14/2023 1003   TRIG 221 (H) 08/29/2018 1045   HDL 25 (L) 11/14/2023 1003   CHOLHDL 6.5 (H) 02/09/2017 1002   VLDL 44 (H) 08/29/2018 1045   LDLCALC 41 11/14/2023 1003   LABVLDL 41 (H) 11/14/2023 1003    Assessment/plan   Diabetes management plan -A1c currently uncontrolled with goal of <7% -Continue current regimen at this time -Continue to monitor and record BG daily -BP is moderately controlled with goal of <130/80 -LDL is at goal of <70 -UACR is not at goal- patient is on ACEi and SGLT2- continue to monitor -Sees PCP again 2/2   Hyperlipidemia -LDL at goal of <70 -TG elevated from previous reading -Continue current medication regimen -Consider dietary changes to help lower TG level:  limit starchy and sugary foods/drinks, increase fiber, vegetables, whole grains and lean sources of protein (fish)  Follow-up:  4 weeks   Joshua Lee, PharmD, DPLA  "

## 2024-02-07 ENCOUNTER — Other Ambulatory Visit (INDEPENDENT_AMBULATORY_CARE_PROVIDER_SITE_OTHER): Payer: Self-pay

## 2024-02-07 DIAGNOSIS — E782 Mixed hyperlipidemia: Secondary | ICD-10-CM

## 2024-02-07 DIAGNOSIS — I10 Essential (primary) hypertension: Secondary | ICD-10-CM

## 2024-02-07 DIAGNOSIS — N183 Chronic kidney disease, stage 3 unspecified: Secondary | ICD-10-CM

## 2024-02-14 ENCOUNTER — Other Ambulatory Visit

## 2024-02-16 ENCOUNTER — Other Ambulatory Visit

## 2024-02-20 ENCOUNTER — Ambulatory Visit: Admitting: Family Medicine

## 2024-02-21 ENCOUNTER — Ambulatory Visit: Admitting: Cardiovascular Disease

## 2024-02-22 ENCOUNTER — Ambulatory Visit: Payer: Self-pay

## 2024-02-22 ENCOUNTER — Other Ambulatory Visit

## 2024-02-22 NOTE — Telephone Encounter (Addendum)
 FYI Only or Action Required?: FYI only for provider: pt canceled PCP appt for tomorrow per current condition, will call back when able to reschedule 6-week follow up for Mounjaro .  Patient was last seen in primary care on 01/03/2024 by Fernand Denyse LABOR, MD.  Called Nurse Triage reporting Ankle Injury.  Symptoms began several days ago.  Interventions attempted: Prescription medications: gabapentin up to 3x/day, tramadol  up to 3x/day, and cyclobenzaprine  x1 daily, then meloxicam from UC, not used oxycodone  from podiatrist, Rest, hydration, or home remedies, Ice/heat application, and Other: podiatrist 1/30 and 2/3, UC on 1/28, ace bandage and walking boot.  Symptoms are: stable.  Triage Disposition: See HCP Within 4 Hours (Or PCP Triage)  Patient/caregiver understands and will follow disposition?: No, refuses disposition       Reason for Disposition  [1] SEVERE pain (e.g., excruciating) AND [2] not improved 2 hours after pain medicine/ice packs  Answer Assessment - Initial Assessment Questions Pt called to cancel 2/5 appt with Dr. Vicci for 6-week follow up for Mounjaro , pt declining to reschedule at this time, but pt confirms his blood sugar has been lower and where it should be, and states he will call back when able to reschedule. Pt provided update on his condition with fractured ankle, pt in severe pain but under care of podiatrist who he last saw yesterday and is due to see next on 2/12 for follow up x-ray. Advised pt be examined today, pt declines. Advised pt call podiatrist if any new or worsening symptoms, advised pt try his prescription from podiatrist for oxycodone  with such severe pain, head to ED if extreme pain or new symptoms.    1. MECHANISM: How did the injury happen? (e.g., twisting injury, direct blow)      Slipped on ice, ankle buckled, fractured ankle  2. ONSET: When did the injury happen? (e.g., minutes or hours ago)      1/28  3. LOCATION: Where is the  injury located?      Ankle  4. APPEARANCE of INJURY: What does the injury look like?      Really didn't look at it much ever Had some marks on it, not sure if from ace bandage, put cortisone cream on it Not sure if looks displaced or not, just know it hurts real bad  5. WEIGHT-BEARING: Can you put weight on that foot? Can you walk (four steps or more)?       Still able to walk some  6. SIZE: For cuts, bruises, or swelling, ask: How large is it? (e.g., inches or centimeters; entire joint)      Not sure if swelling worse than when last examined  7. PAIN: Is there pain? If Yes, ask: How bad is the pain?  What does it keep you from doing? (Scale 0-10; or none, mild, moderate, severe)     10/10 Pain eases off little bit with pain management meds (gabapentin up to 3x/day, tramadol  up to 3x/day, and cyclobenzaprine  x1 daily) plus meloxicam from UC Didn't take oxycodone  from podiatrist trying not to take it Barely touch it and throbbing, sharp pain  9. OTHER SYMPTOMS: Do you have any other symptoms?      Hard to tell if new numbness  Saw Dr. Ashley yesterday at 2:45 pm because hurting so bad, will re x-ray it on 2/12, takes about 6 weeks to heal as long as sitting right  Protocols used: Ankle Injury-A-AH

## 2024-02-22 NOTE — Telephone Encounter (Signed)
 He is already under the care of podiatrist. If he pain is that bad I do advise him to follow up with his podiatrist, but he doesn't need an acute appointment with me

## 2024-02-23 ENCOUNTER — Ambulatory Visit: Admitting: Family Medicine

## 2024-02-24 ENCOUNTER — Ambulatory Visit: Admitting: Cardiovascular Disease

## 2024-02-28 ENCOUNTER — Ambulatory Visit: Admitting: Cardiovascular Disease

## 2024-03-12 ENCOUNTER — Ambulatory Visit: Admitting: Podiatry

## 2024-03-20 ENCOUNTER — Other Ambulatory Visit
